# Patient Record
Sex: Female | Born: 1955 | Race: White | Hispanic: No
Health system: Southern US, Community
[De-identification: ages and names within clinical notes are randomized; demographics above are authoritative.]

## PROBLEM LIST (undated history)

## (undated) ENCOUNTER — Ambulatory Visit

## (undated) DIAGNOSIS — F32A Depression, unspecified: Secondary | ICD-10-CM

## (undated) DIAGNOSIS — G709 Myoneural disorder, unspecified: Secondary | ICD-10-CM

## (undated) DIAGNOSIS — G35 Multiple sclerosis: Secondary | ICD-10-CM

## (undated) DIAGNOSIS — F419 Anxiety disorder, unspecified: Secondary | ICD-10-CM

## (undated) DIAGNOSIS — Z8669 Personal history of other diseases of the nervous system and sense organs: Secondary | ICD-10-CM

## (undated) DIAGNOSIS — G43909 Migraine, unspecified, not intractable, without status migrainosus: Secondary | ICD-10-CM

## (undated) DIAGNOSIS — G35D Multiple sclerosis, unspecified: Secondary | ICD-10-CM

## (undated) DIAGNOSIS — R739 Hyperglycemia, unspecified: Secondary | ICD-10-CM

## (undated) DIAGNOSIS — E039 Hypothyroidism, unspecified: Secondary | ICD-10-CM

## (undated) HISTORY — DX: Anxiety disorder, unspecified: F41.9

## (undated) HISTORY — DX: Personal history of other diseases of the nervous system and sense organs: Z86.69

## (undated) HISTORY — DX: Hypothyroidism, unspecified: E03.9

## (undated) HISTORY — DX: Depression, unspecified: F32.A

## (undated) HISTORY — DX: Myoneural disorder, unspecified: G70.9

## (undated) HISTORY — DX: Hyperglycemia, unspecified: R73.9

## (undated) HISTORY — PX: TONSILLECTOMY: SUR1361

---

## 2017-11-15 DIAGNOSIS — S22000A Wedge compression fracture of unspecified thoracic vertebra, initial encounter for closed fracture: Secondary | ICD-10-CM | POA: Insufficient documentation

## 2017-11-15 DIAGNOSIS — G35D Multiple sclerosis, unspecified: Secondary | ICD-10-CM | POA: Insufficient documentation

## 2017-11-15 DIAGNOSIS — G35 Multiple sclerosis: Secondary | ICD-10-CM | POA: Insufficient documentation

## 2017-11-15 DIAGNOSIS — M545 Low back pain, unspecified: Secondary | ICD-10-CM | POA: Insufficient documentation

## 2019-03-21 DIAGNOSIS — M48061 Spinal stenosis, lumbar region without neurogenic claudication: Secondary | ICD-10-CM | POA: Insufficient documentation

## 2019-03-21 DIAGNOSIS — M5136 Other intervertebral disc degeneration, lumbar region: Secondary | ICD-10-CM | POA: Insufficient documentation

## 2019-07-24 ENCOUNTER — Encounter: Payer: Self-pay | Admitting: Neurology

## 2019-09-18 ENCOUNTER — Encounter: Payer: Self-pay | Admitting: Neurology

## 2019-09-24 ENCOUNTER — Other Ambulatory Visit: Payer: Self-pay

## 2019-09-24 ENCOUNTER — Ambulatory Visit
Admission: EM | Admit: 2019-09-24 | Discharge: 2019-09-24 | Disposition: A | Discharge status: Home / Self Care | Payer: Medicare Other

## 2019-09-24 DIAGNOSIS — L089 Local infection of the skin and subcutaneous tissue, unspecified: Secondary | ICD-10-CM

## 2019-09-24 DIAGNOSIS — Z5189 Encounter for other specified aftercare: Secondary | ICD-10-CM | POA: Diagnosis not present

## 2019-09-24 HISTORY — DX: Migraine, unspecified, not intractable, without status migrainosus: G43.909

## 2019-09-24 HISTORY — DX: Multiple sclerosis, unspecified: G35.D

## 2019-09-24 HISTORY — DX: Multiple sclerosis: G35

## 2019-09-24 MED ORDER — MUPIROCIN 2 % EX OINT: 1.0000 "application " | TOPICAL_OINTMENT | Freq: Two times a day (BID) | CUTANEOUS | 0 refills | Status: DC

## 2019-09-24 MED ORDER — DOXYCYCLINE HYCLATE 100 MG PO CAPS: 100.0000 mg | ORAL_CAPSULE | Freq: Two times a day (BID) | ORAL | 0 refills | Status: DC

## 2019-09-24 NOTE — ED Triage Notes (Signed)
Pt has a healing laceration from fall 2 weeks that has some redness and swelling

## 2019-09-24 NOTE — Discharge Instructions (Signed)
Wash with warm water and mild soap Keep covered Bactroban ointment prescribed.  Use as directed Prescribed doxycycline take as directed and to completion Follow up with PCP this week or next for recheck I have also included info for wound clinic Return or go to the ED if you have any new or worsening symptoms such as increased pain, redness, swelling, discharge, high fever, night sweats, abdominal pain, etc..Marland Kitchen

## 2019-09-24 NOTE — ED Provider Notes (Signed)
[facility]   [id number] 09/24/19 Arrival Time: 3329  CC: Wound check  SUBJECTIVE:  Pamela Roman is a 64 y.o. female who presents with a RT lower extremity wound that occurred apx 2 week ago.  Has MS and tripped while using her walker.  Scraped her RT LE.  Wound has been healing well, but last night noticed some redness and swelling.  Also states her dog has been licking it and its painful when she does.  Denies previous symptoms in the past or difficulty with wound healing.  Denies fever, chills, nausea, vomiting, purulent drainage, decrease strength or sensation.   ROS: As per HPI.  All other pertinent ROS negative.     Past Medical History:  Diagnosis Date  . Migraines   . Multiple sclerosis (HCC)    Past Surgical History:  Procedure Laterality Date  . TONSILLECTOMY     Allergies  Allergen Reactions  . Codeine Nausea And Vomiting   No current facility-administered medications on file prior to encounter.   Current Outpatient Medications on File Prior to Encounter  Medication Sig Dispense Refill  . alendronate (FOSAMAX) 70 MG tablet Take 70 mg by mouth once a week. Take with a full glass of water on an empty stomach.    . levothyroxine (SYNTHROID) 25 MCG tablet Take 25 mcg by mouth daily before breakfast.    . simvastatin (ZOCOR) 20 MG tablet Take 20 mg by mouth daily.     Social History   Socioeconomic History  . Marital status: Widowed    Spouse name: Not on file  . Number of children: Not on file  . Years of education: Not on file  . Highest education level: Not on file  Occupational History  . Not on file  Tobacco Use  . Smoking status: Passive Smoke Exposure - Never Smoker  . Smokeless tobacco: Never Used  Substance and Sexual Activity  . Alcohol use: Not Currently  . Drug use: Never  . Sexual activity: Not on file  Other Topics Concern  . Not on file  Social History Narrative  . Not on file   Social Determinants of Health   Financial Resource  Strain:   . Difficulty of Paying Living Expenses:   Food Insecurity:   . Worried About Programme researcher, broadcasting/film/video in the Last Year:   . Barista in the Last Year:   Transportation Needs:   . Freight forwarder (Medical):   Marland Kitchen Lack of Transportation (Non-Medical):   Physical Activity:   . Days of Exercise per Week:   . Minutes of Exercise per Session:   Stress:   . Feeling of Stress :   Social Connections:   . Frequency of Communication with Friends and Family:   . Frequency of Social Gatherings with Friends and Family:   . Attends Religious Services:   . Active Member of Clubs or Organizations:   . Attends Banker Meetings:   Marland Kitchen Marital Status:   Intimate Partner Violence:   . Fear of Current or Ex-Partner:   . Emotionally Abused:   Marland Kitchen Physically Abused:   . Sexually Abused:    History reviewed. No pertinent family history.   OBJECTIVE:  Vitals:   09/24/19 0826  BP: 138/89  Pulse: 76  Resp: 20  Temp: 98.2 F (36.8 C)  SpO2: 97%     General appearance: alert; no distress Head: NCAT Eye: EOM I grossly CV: Dorsalis pedis pulse 2+; cap refill < 2 sec  Lungs: normal respiratory effort Skin: 3 x 2 cm wound with overlying scab formation to anterior RLE, mild surrounding erythema, swelling appreciated, no obvious drainage or bleeding, mildly TTP Neuro: Ambulates with a walker Psychological: alert and cooperative; normal mood and affect  ASSESSMENT & PLAN:  1. Visit for wound check   2. Infected abrasion of right lower extremity, initial encounter     Meds ordered this encounter  Medications  . doxycycline (VIBRAMYCIN) 100 MG capsule    Sig: Take 1 capsule (100 mg total) by mouth 2 (two) times daily.    Dispense:  20 capsule    Refill:  0    Order Specific Question:   Supervising Provider    Answer:   Raylene Everts [[id number]]  . mupirocin ointment (BACTROBAN) 2 %    Sig: Apply 1 application topically 2 (two) times daily.    Dispense:  30 g     Refill:  0    Order Specific Question:   Supervising Provider    Answer:   Raylene Everts [[id number]]   Wash with warm water and mild soap Keep covered Bactroban ointment prescribed.  Use as directed Prescribed doxycycline take as directed and to completion Follow up with PCP this week or next for recheck I have also included info for wound clinic Return or go to the ED if you have any new or worsening symptoms such as increased pain, redness, swelling, discharge, high fever, night sweats, abdominal pain, etc...   Reviewed expectations re: course of current medical issues. Questions answered. Outlined signs and symptoms indicating need for more acute intervention. Patient verbalized understanding. After Visit Summary given.   Lestine Box, PA-C 09/24/19 [phone]

## 2019-10-09 ENCOUNTER — Other Ambulatory Visit: Payer: Self-pay

## 2019-10-09 ENCOUNTER — Encounter (HOSPITAL_BASED_OUTPATIENT_CLINIC_OR_DEPARTMENT_OTHER): Payer: Medicare Other | Attending: Internal Medicine | Admitting: Internal Medicine

## 2019-10-09 DIAGNOSIS — G35 Multiple sclerosis: Secondary | ICD-10-CM | POA: Diagnosis not present

## 2019-10-09 DIAGNOSIS — Z885 Allergy status to narcotic agent status: Secondary | ICD-10-CM | POA: Diagnosis not present

## 2019-10-09 DIAGNOSIS — Z09 Encounter for follow-up examination after completed treatment for conditions other than malignant neoplasm: Secondary | ICD-10-CM | POA: Diagnosis present

## 2019-10-11 NOTE — Progress Notes (Signed)
Pamela, Roman ([id number]) Visit Report for 10/09/2019 Chief Complaint Document Details Patient Name: Date of Service: Pamela Roman, Pamela Roman 10/09/2019 1:15 PM Medical Record Number:[id number] Patient Account Number: [id number] Date of Birth/Sex: Treating RN: 1955/11/09 (64 y.o. Wynelle Link Primary Care Provider: PATIENT, NO Other Clinician: Referring Provider: Treating Provider/Extender:Aniya Jolicoeur, Lamar Sprinkles in Treatment: 0 Information Obtained from: Patient Chief Complaint 10/09/2019; patient is here for review of a traumatic wound on her right lateral lower leg Electronic Signature(s) Signed: 10/09/2019 5:24:04 PM By: Baltazar Najjar MD Entered By: Baltazar Najjar on 10/09/2019 14:53:45 -------------------------------------------------------------------------------- HPI Details Patient Name: Date of Service: Pamela, Roman 10/09/2019 1:15 PM Medical Record Number:[id number] Patient Account Number: [id number] Date of Birth/Sex: Treating RN: 1956/02/07 (64 y.o. Wynelle Link Primary Care Provider: PATIENT, NO Other Clinician: Referring Provider: Treating Provider/Extender:Jaila Schellhorn, Lamar Sprinkles in Treatment: 0 History of Present Illness HPI Description: 10/09/2019; ADMISSION This is a 64 year old woman who recently moved to [place] from [place]. She was having work done at her home and fell while walking with her walker. This was about 6 weeks ago. She did not show up for a medical review until 09/24/2019 when she was seen at an urgent care with an infected abrasion in the right lower extremity. She was given doxycycline which the patient has completed and mupirocin which she is stopped using. She arrives in clinic for review of this. Past medical history includes multiple sclerosis, migraines, hypothyroidism and hyperlipidemia. Electronic Signature(s) Signed: 10/09/2019 5:24:04 PM By: Baltazar Najjar MD Entered By: Baltazar Najjar on 10/09/2019  14:55:11 -------------------------------------------------------------------------------- Physical Exam Details Patient Name: Date of Service: Pamela, Roman 10/09/2019 1:15 PM Medical Record Number:[id number] Patient Account Number: [id number] Date of Birth/Sex: Treating RN: 03/04/1956 (64 y.o. Wynelle Link Primary Care Provider: PATIENT, NO Other Clinician: Referring Provider: Treating Provider/Extender:Millicent Blazejewski, Lamar Sprinkles in Treatment: 0 Constitutional Sitting or standing Blood Pressure is within target range for patient.. Pulse regular and within target range for patient.Marland Kitchen Respirations regular, non-labored and within target range.. Temperature is normal and within the target range for the patient.Marland Kitchen Appears in no distress. Respiratory work of breathing is normal. Bilateral breath sounds are clear and equal in all lobes with no wheezes, rales or rhonchi.. Cardiovascular Heart rhythm and rate regular, without murmur or gallop.. Pedal pulses are palpable. No major edema. Gastrointestinal (GI) Abdomen is soft and non-distended without masses or tenderness.. No liver or spleen enlargement. Genitourinary (GU) Bladder is not distended. Integumentary (Hair, Skin) There is no evidence of infection.. Notes Wound exam; fortunately the patient's wound is fully epithelialized and healed. This was on the right lateral calf she has some swelling around the wound which I think was an underlying hematoma at 1 point although this is nontender and not infected. Electronic Signature(s) Signed: 10/09/2019 5:24:04 PM By: Baltazar Najjar MD Entered By: Baltazar Najjar on 10/09/2019 14:56:31 -------------------------------------------------------------------------------- Physician Orders Details Patient Name: Date of Service: Pamela, Roman 10/09/2019 1:15 PM Medical Record Number:[id number] Patient Account Number: [id number] Date of Birth/Sex: Treating RN: 1956/05/07 (64 y.o. Wynelle Link Primary Care Provider: PATIENT, NO Other Clinician: Referring Provider: Treating Provider/Extender:Nolan Lasser, Lamar Sprinkles in Treatment: 0 Verbal / Phone Orders: No Diagnosis Coding Discharge From [facility] Services Discharge from Wound Care Center - CONSULT ONLY - NO OPEN WOUNDS Electronic Signature(s) Signed: 10/09/2019 5:09:30 PM By: Zandra Abts RN, BSN Signed: 10/09/2019 5:24:04 PM By: Baltazar Najjar MD Entered By: Zandra Abts on 10/09/2019 14:01:39 -------------------------------------------------------------------------------- Problem List Details Patient Name: Date of Service: Pamela, Roman 10/09/2019 1:15 PM Medical Record Number:[id number] Patient Account  Number: [id number] Date of Birth/Sex: Treating RN: 12/03/1955 (64 y.o. 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 Wynelle Link Primary Care Provider: PATIENT, NO Other Clinician: Referring Provider: Treating Provider/Extender:Eion Timbrook, Lamar Sprinkles in Treatment: 0 Subjective Chief Complaint Information obtained from Patient 10/09/2019; patient is here for review of a traumatic wound on her right lateral lower leg History  of Present Illness (HPI) 10/09/2019; ADMISSION This is a 64 year old woman who recently moved to [place] from [place]. She was having work done at her home and fell while walking with her walker. This was about 6 weeks ago. She did not show up for a medical review until 09/24/2019 when she was seen at an urgent care with an infected abrasion in the right lower extremity. She was given doxycycline which the patient has completed and mupirocin which she is stopped using. She arrives in clinic for review of this. Past medical history includes multiple sclerosis, migraines, hypothyroidism and hyperlipidemia. Patient History Information obtained from Patient. Allergies codeine Family History Diabetes - Father, Heart Disease - Father, Thyroid Problems - Siblings, No family history of Cancer, Hereditary Spherocytosis, Hypertension, Kidney Disease, Lung Disease, Seizures, Stroke, Tuberculosis. Social History Never smoker, Marital Status - Widowed, Alcohol Use - Never, Drug Use - No History, Caffeine Use - Rarely. Medical History Eyes Patient has history of Cataracts Denies history of Glaucoma, Optic Neuritis Ear/Nose/Mouth/Throat Denies history of Chronic sinus problems/congestion, Middle ear problems Hematologic/Lymphatic Denies history of Anemia, Hemophilia, Human Immunodeficiency Virus, Lymphedema, Sickle Cell Disease Respiratory Denies history of Aspiration, Asthma, Chronic Obstructive Pulmonary Disease (COPD), Pneumothorax, Sleep Apnea, Tuberculosis Cardiovascular Denies history of Angina, Arrhythmia, Congestive Heart Failure, Coronary Artery Disease, Deep Vein Thrombosis, Hypertension, Hypotension, Myocardial Infarction, Peripheral Arterial Disease, Peripheral Venous Disease, Phlebitis, Vasculitis Gastrointestinal Denies history of Cirrhosis , Colitis, Crohnoos, Hepatitis A, Hepatitis B, Hepatitis C Endocrine Denies history of Type I Diabetes, Type II  Diabetes Genitourinary Denies history of End Stage Renal Disease Integumentary (Skin) Denies history of History of Burn Musculoskeletal Denies history of Gout, Rheumatoid Arthritis, Osteoarthritis, Osteomyelitis Neurologic Denies history of Dementia, Neuropathy, Quadriplegia, Paraplegia, Seizure Disorder Oncologic Denies history of Received Chemotherapy, Received Radiation Psychiatric Denies history of Anorexia/bulimia, Confinement Anxiety Review of Systems (ROS) Constitutional Symptoms (General Health) Denies complaints or symptoms of Fatigue, Fever, Chills, Marked Weight Change. Eyes Complains or has symptoms of Glasses / Contacts. Denies complaints or symptoms of Dry Eyes, Vision Changes. Ear/Nose/Mouth/Throat Denies complaints or symptoms of Chronic sinus problems or rhinitis. Respiratory Denies complaints or symptoms of Chronic or frequent coughs, Shortness of Breath. Cardiovascular Denies complaints or symptoms of Chest pain. Gastrointestinal Denies complaints or symptoms of Frequent diarrhea, Nausea, Vomiting. Endocrine Denies complaints or symptoms of Heat/cold intolerance. Genitourinary Denies complaints or symptoms of Frequent urination. Integumentary (Skin) Complains or has symptoms of Wounds. Musculoskeletal Denies complaints or symptoms of Muscle Pain, Muscle Weakness. Neurologic Denies complaints or symptoms of Numbness/parasthesias. Psychiatric Denies complaints or symptoms of Claustrophobia, Suicidal. Objective  Constitutional Sitting or standing Blood Pressure is within target range for patient.. Pulse regular and within target range for patient.Marland Kitchen Respirations regular, non-labored and within target range.. Temperature is normal and within the target range for the patient.Marland Kitchen Appears in no distress. Vitals Time Taken: 1:19 PM, Height: 63 in, Source: Stated, Weight: 175 lbs, Source: Stated, BMI: 31, Temperature: 98.1 F, Pulse: 86 bpm, Respiratory Rate: 18  breaths/min, Blood Pressure: 140/83 mmHg. Respiratory work of breathing is normal. Bilateral breath sounds are clear and equal in all lobes with no wheezes, rales or rhonchi.. Cardiovascular Heart rhythm and rate regular, without murmur or gallop.. Pedal pulses are palpable. No major edema. Gastrointestinal (GI) Abdomen is soft and non-distended without masses or tenderness.. No liver or spleen enlargement. Genitourinary (GU) Bladder is not distended. General Notes: Wound exam; fortunately the patient's wound is fully epithelialized and healed. This was on the right lateral calf she has some swelling around the wound which I think was an underlying hematoma at 1 point although this is nontender and not infected. Integumentary (Hair, Skin) There is no evidence of infection.. Wound #1 status is Healed - Epithelialized. Original cause of wound was Trauma. The wound is located on the Right,Anterior Lower Leg. The wound measures 0cm length x 0cm width x 0cm depth; 0cm^2 area and 0cm^3 volume. There is no tunneling or undermining noted. There is a none present amount of drainage noted. The wound margin is flat and intact. There is no granulation within the wound bed. There is no necrotic tissue within the wound bed. Assessment Active Problems ICD-10 Non-pressure chronic ulcer of other part of right lower leg with other specified severity Multiple sclerosis Plan Discharge From [facility] Services: Discharge from Wound Care Center - CONSULT ONLY - NO OPEN WOUNDS 1. The patient has no open wound she therefore does not need to be followed here 2. She has a small remaining probable hematoma but I do not think she needed any additional compression. 3. The patient has a follow-up with her primary doctor and a neurologist. I think this is satisfactory for follow-up. Electronic Signature(s) Signed: 10/09/2019 5:24:04 PM By: Baltazar Najjar MD Entered By: Baltazar Najjar on 10/09/2019  14:57:26 -------------------------------------------------------------------------------- HxROS Details Patient Name: Date of Service: AJANAY, FARVE 10/09/2019 1:15 PM Medical Record Number:[id number] Patient Account Number: [id number] Date of Birth/Sex: Treating RN: October 16, 1955 (64 y.o. Freddy Finner Primary Care Provider: PATIENT, NO Other Clinician: Referring Provider: Treating Provider/Extender:Gari Trovato, Lamar Sprinkles in Treatment: 0 Information Obtained From Patient Constitutional Symptoms (General Health) Complaints and Symptoms: Negative for: Fatigue; Fever; Chills; Marked Weight Change Eyes Complaints and Symptoms: Positive for: Glasses / Contacts Negative for: Dry Eyes; Vision Changes Medical History: Positive for: Cataracts Negative for: Glaucoma; Optic Neuritis Ear/Nose/Mouth/Throat Complaints and Symptoms: Negative for: Chronic sinus problems or rhinitis Medical History: Negative for: Chronic sinus problems/congestion; Middle ear problems Respiratory Complaints and Symptoms: Negative for: Chronic or frequent coughs; Shortness of Breath Medical History: Negative for: Aspiration; Asthma; Chronic Obstructive Pulmonary Disease (COPD); Pneumothorax; Sleep Apnea; Tuberculosis Cardiovascular Complaints and Symptoms: Negative for: Chest pain Medical History: Negative for: Angina; Arrhythmia; Congestive Heart Failure; Coronary Artery Disease; Deep Vein Thrombosis; Hypertension; Hypotension; Myocardial Infarction; Peripheral Arterial Disease; Peripheral Venous Disease; Phlebitis; Vasculitis Gastrointestinal Complaints and Symptoms: Negative for: Frequent diarrhea; Nausea; Vomiting Medical History: Negative for: Cirrhosis ; Colitis; Crohns; Hepatitis A; Hepatitis B; Hepatitis C Endocrine Complaints and Symptoms: Negative for: Heat/cold intolerance Medical History: Negative for: Type I Diabetes; Type II Diabetes Genitourinary Complaints and Symptoms: Negative for:  Frequent  urination Medical History: Negative for: End Stage Renal Disease Integumentary (Skin) Complaints and Symptoms: Positive for: Wounds Medical History: Negative for: History of Burn Musculoskeletal Complaints and Symptoms: Negative for: Muscle Pain; Muscle Weakness Medical History: Negative for: Gout; Rheumatoid Arthritis; Osteoarthritis; Osteomyelitis Neurologic Complaints and Symptoms: Negative for: Numbness/parasthesias Medical History: Negative for: Dementia; Neuropathy; Quadriplegia; Paraplegia; Seizure Disorder Psychiatric Complaints and Symptoms: Negative for: Claustrophobia; Suicidal Medical History: Negative for: Anorexia/bulimia; Confinement Anxiety Hematologic/Lymphatic Medical History: Negative for: Anemia; Hemophilia; Human Immunodeficiency Virus; Lymphedema; Sickle Cell Disease Oncologic Medical History: Negative for: Received Chemotherapy; Received Radiation HBO Extended History Items Eyes: Cataracts Immunizations Pneumococcal Vaccine: Received Pneumococcal Vaccination: No Implantable Devices None Family and Social History Cancer: No; Diabetes: Yes - Father; Heart Disease: Yes - Father; Hereditary Spherocytosis: No; Hypertension: No; Kidney Disease: No; Lung Disease: No; Seizures: No; Stroke: No; Thyroid Problems: Yes - Siblings; Tuberculosis: No; Never smoker; Marital Status - Widowed; Alcohol Use: Never; Drug Use: No History; Caffeine Use: Rarely; Financial Concerns: No; Food, Clothing or Shelter Needs: No; Support System Lacking: No; Transportation Concerns: No Electronic Signature(s) Signed: 10/09/2019 5:24:04 PM By: Linton Ham MD Signed: 10/10/2019 6:43:44 PM By: Carlene Coria RN Entered By: Carlene Coria on 10/09/2019 13:24:33 -------------------------------------------------------------------------------- SuperBill Details Patient Name: Date of Service: LAVRA, IMLER 10/09/2019 Medical Record [place] Patient Account Number:  [id number] Date of Birth/Sex: Treating RN: 1956-04-16 (65 y.o. Nancy Fetter Primary Care Provider: PATIENT, NO Other Clinician: Referring Provider: Treating Provider/Extender:Alcus Bradly, Esperanza Richters in Treatment: 0 Diagnosis Coding ICD-10 Codes Code Description L97.818 Non-pressure chronic ulcer of other part of right lower leg with other specified severity G35 Multiple sclerosis Facility Procedures CPT4 Code: [id number] Description: [facility] VISIT-LEV 3 EST PT Modifier: Quantity: 1 Physician Procedures CPT4 Code Description: [id number] - WC PHYS LEVEL 2 - NEW PT ICD-10 Diagnosis Description L97.818 Non-pressure chronic ulcer of other part of right lower l severity G35 Multiple sclerosis Modifier: eg with other spec Quantity: 1 ified Electronic Signature(s) Signed: 10/09/2019 5:24:04 PM By: Linton Ham MD Entered By: Linton Ham on 10/09/2019 14:58:07

## 2019-10-11 NOTE — Progress Notes (Signed)
Pamela, Roman ([id number]) Visit Report for 10/09/2019 Abuse/Suicide Risk Screen Details Patient Name: Date of Service: Pamela Roman, Pamela Roman 10/09/2019 1:15 PM Medical Record Pamela Roman Patient Account Number: [id number] Date of Birth/Sex: Treating RN: 09/18/1955 (64 y.o. Orvan Falconer Primary Care Raphaela Cannaday: PATIENT, NO Other Clinician: Referring Jadan Rouillard: Treating Athaliah Baumbach/Extender:Robson, Esperanza Richters in Treatment: 0 Abuse/Suicide Risk Screen Items Answer ABUSE RISK SCREEN: Has anyone close to you tried to hurt or harm you recentlyo No Do you feel uncomfortable with anyone in your familyo No Has anyone forced you do things that you didnt want to doo No Electronic Signature(s) Signed: 10/10/2019 6:43:44 PM By: Carlene Coria RN Entered By: Carlene Coria on 10/09/2019 13:25:01 -------------------------------------------------------------------------------- Activities of Daily Living Details Patient Name: Date of Service: Pamela, Roman 10/09/2019 1:15 PM Medical Record [place] Patient Account Number: [id number] Date of Birth/Sex: Treating RN: 09-18-1955 (64 y.o. Orvan Falconer Primary Care Meryl Hubers: PATIENT, NO Other Clinician: Referring Jozlin Bently: Treating Joliana Claflin/Extender:Robson, Esperanza Richters in Treatment: 0 Activities of Daily Living Items Answer Activities of Daily Living (Please select one for each item) Drive Automobile Completely Able Take Medications Completely Able Use Telephone Completely Able Care for Appearance Completely Able Use Toilet Completely Able Bath / Shower Completely Able Dress Self Completely Able Feed Self Completely Able Walk Completely Able Get In / Out Bed Completely Able Housework Need Assistance Prepare Meals Completely [place] Completely Able Shop for Self Completely Able Electronic Signature(s) Signed: 10/10/2019 6:43:44 PM By: Carlene Coria RN Entered By: Carlene Coria on 10/09/2019  13:26:00 -------------------------------------------------------------------------------- Education Screening Details Patient Name: Date of Service: [name], [name] 10/09/2019 1:15 PM Medical Record Pamela Roman Patient Account Number: [id number] Date of Birth/Sex: Treating RN: [birth year] (64 y.o. Orvan Falconer Primary Care Zamyah Wiesman: PATIENT, NO Other Clinician: Referring Julian Medina: Treating Rhilyn Battle/Extender:Robson, Esperanza Richters in Treatment: 0 Primary Learner Assessed: Patient Learning Preferences/Education Level/Primary Language Learning Preference: Explanation Highest Education Level: College or Above Preferred Language: English Cognitive Barrier Language Barrier: No Translator Needed: No Memory Deficit: No Emotional Barrier: No Cultural/Religious Beliefs Affecting Medical Care: No Physical Barrier Impaired Vision: Yes Glasses Impaired Hearing: No Decreased Hand dexterity: No Knowledge/Comprehension Knowledge Level: Medium Comprehension Level: High Ability to understand written High instructions: Ability to understand verbal High instructions: Motivation Anxiety Level: Calm Cooperation: Cooperative Education Importance: Acknowledges Need Interest in Health Problems: Asks Questions Perception: Coherent Willingness to Engage in Self- High Management Activities: Readiness to Engage in Self- High Management Activities: Electronic Signature(s) Signed: 10/10/2019 6:43:44 PM By: Carlene Coria RN Entered By: Carlene Coria on 10/09/2019 13:27:39 -------------------------------------------------------------------------------- Fall Risk Assessment Details Patient Name: Date of Service: Pamela, Roman 10/09/2019 1:15 PM Medical Record Pamela Roman Patient Account Number: [id number] Date of Birth/Sex: Treating RN: 10/17/1955 (64 y.o. Orvan Falconer Primary Care Kanai Berrios: PATIENT, NO Other Clinician: Referring Charles Niese: Treating Adarrius Graeff/Extender:Robson, Esperanza Richters  in Treatment: 0 Fall Risk Assessment Items Have you had 2 or more falls in the last 12 monthso 0 Yes Have you had any fall that resulted in injury in the last 12 monthso 0 Yes FALLS RISK SCREEN History of falling - immediate or within 3 months 25 Yes Secondary diagnosis (Do you have 2 or more medical diagnoseso) 0 No Ambulatory aid None/bed rest/wheelchair/nurse 0 No Crutches/cane/walker 0 No Furniture 0 No Intravenous therapy Access/Saline/Heparin Lock 0 No Weak (short steps with or without shuffle, stooped but able to lift head 0 No while walking, may seek support from furniture) Impaired (short steps with shuffle, may have difficulty arising from chair, 0 No head down, impaired balance) Mental Status  Oriented to own ability 0 No Overestimates or forgets limitations 0 No Risk Level: Medium Risk Score: 25 Electronic Signature(s) Signed: 10/10/2019 6:43:44 PM By: Yevonne Pax RN Entered By: Yevonne Pax on 10/09/2019 13:28:07 -------------------------------------------------------------------------------- Foot Assessment Details Patient Name: Date of Service: Pamela, Roman 10/09/2019 1:15 PM Medical Record Number:[id number] Patient Account Number: [id number] Date of Birth/Sex: Treating RN: Dec 07, 1955 (64 y.o. Pamela Roman, Carrie Primary Care Pamela Roman: PATIENT, NO Other Clinician: Referring Anielle Headrick: Treating Kimberle Stanfill/Extender:Robson, Lamar Sprinkles in Treatment: 0 Foot Assessment Items Site Locations + = Sensation present, - = Sensation absent, C = Callus, U = Ulcer R = Redness, W = Warmth, M = Maceration, PU = Pre-ulcerative lesion F = Fissure, S = Swelling, D = Dryness Assessment Right: Left: Other Deformity: No No Prior Foot Ulcer: No No Prior Amputation: No No Charcot Joint: No No Ambulatory Status: Ambulatory Without Help Gait: Steady Electronic Signature(s) Signed: 10/10/2019 6:43:44 PM By: Yevonne Pax RN Entered By: Yevonne Pax on 10/09/2019  13:30:26 -------------------------------------------------------------------------------- Nutrition Risk Screening Details Patient Name: Date of Service: [name], [name] 10/09/2019 1:15 PM Medical Record Number:[id number] Patient Account Number: [id number] Date of Birth/Sex: Treating RN: [birth year] (63 y.o. Freddy Finner Primary Care Jariyah Hackley: PATIENT, NO Other Clinician: Referring Nare Gaspari: Treating Cala Kruckenberg/Extender:Robson, Lamar Sprinkles in Treatment: 0 Height (in): 63 Weight (lbs): 175 Body Mass Index (BMI): 31 Nutrition Risk Screening Items Score Screening NUTRITION RISK SCREEN: I have an illness or condition that made me change the kind and/or 0 No amount of food I eat I eat fewer than two meals per day 0 No I eat few fruits and vegetables, or milk products 0 No I have three or more drinks of beer, liquor or wine almost every day 0 No I have tooth or mouth problems that make it hard for me to eat 0 No I don't always have enough money to buy the food I need 0 No I eat alone most of the time 0 No I take three or more different prescribed or over-the-counter drugs a day 1 Yes 0 No Without wanting to, I have lost or gained 10 pounds in the last six months I am not always physically able to shop, cook and/or feed myself 2 Yes Nutrition Protocols Good Risk Protocol Provide education on Moderate Risk Protocol 0 nutrition High Risk Proctocol Risk Level: Moderate Risk Score: 3 Electronic Signature(s) Signed: 10/10/2019 6:43:44 PM By: Yevonne Pax RN Entered By: Yevonne Pax on 10/09/2019 13:28:34

## 2019-10-11 NOTE — Progress Notes (Signed)
Pamela Roman, Pamela Roman ([id number]) Visit Report for 10/09/2019 Allergy List Details Patient Name: Date of Service: Pamela Roman, Pamela Roman 10/09/2019 1:15 PM Medical Record Number:[id number] Patient Account Number: [id number] Date of Birth/Sex: Treating RN: Sep 22, 1955 (64 y.o. Freddy Finner Primary Care Devika Dragovich: PATIENT, NO Other Clinician: Referring Cecilio Ohlrich: Treating Ronya Gilcrest/Extender:Robson, Lamar Sprinkles in Treatment: 0 Allergies Active Allergies codeine Allergy Notes Electronic Signature(s) Signed: 10/10/2019 6:43:44 PM By: Yevonne Pax RN Entered By: Yevonne Pax on 10/09/2019 13:20:46 -------------------------------------------------------------------------------- Arrival Information Details Patient Name: Date of Service: Pamela Roman, Pamela Roman 10/09/2019 1:15 PM Medical Record Number:[id number] Patient Account Number: [id number] Date of Birth/Sex: Treating RN: 04/20/1956 (64 y.o. Freddy Finner Primary Care Haskell Rihn: PATIENT, NO Other Clinician: Referring Maalik Pinn: Treating Rockland Kotarski/Extender:Robson, Lamar Sprinkles in Treatment: 0 Visit Information Patient Arrived: Ambulatory Arrival Time: 13:16 Accompanied By: self Transfer Assistance: None Patient Identification Verified: Yes Secondary Verification Process Completed: Yes Patient Requires Transmission-Based No Precautions: Patient Has Alerts: No Electronic Signature(s) Signed: 10/10/2019 6:43:44 PM By: Yevonne Pax RN Entered By: Yevonne Pax on 10/09/2019 13:17:37 -------------------------------------------------------------------------------- Clinic Level of Care Assessment Details Patient Name: Date of Service: Pamela Roman 10/09/2019 1:15 PM Medical Record Number:[id number] Patient Account Number: [id number] Date of Birth/Sex: Treating RN: 1955/07/23 (64 y.o. Wynelle Link Primary Care Aiyden Lauderback: PATIENT, NO Other Clinician: Referring Wong Steadham: Treating Norvell Ureste/Extender:Robson, Lamar Sprinkles in Treatment: 0 Clinic Level of Care Assessment  Items TOOL 2 Quantity Score X - Use when only an EandM is performed on the INITIAL visit 1 0 ASSESSMENTS - Nursing Assessment / Reassessment X - General Physical Exam (combine w/ comprehensive assessment (listed just below) 1 20 when performed on new pt. evals) X - Comprehensive Assessment (HX, ROS, Risk Assessments, Wounds Hx, etc.) 1 25 ASSESSMENTS - Wound and Skin Assessment / Reassessment []  - Simple Wound Assessment / Reassessment - one wound 0 []  - Complex Wound Assessment / Reassessment - multiple wounds 0 []  - Dermatologic / Skin Assessment (not related to wound area) 0 ASSESSMENTS - Ostomy and/or Continence Assessment and Care []  - Incontinence Assessment and Management 0 []  - Ostomy Care Assessment and Management (repouching, etc.) 0 PROCESS - Coordination of Care X - Simple Patient / Family Education for ongoing care 1 15 []  - Complex (extensive) Patient / Family Education for ongoing care 0 X - Staff obtains , Records, Test Results / Process Orders 1 10 []  - Staff telephones HHA, Nursing Homes / Clarify orders / etc 0 []  - Routine Transfer to another Facility (non-emergent condition) 0 []  - Routine Hospital Admission (non-emergent condition) 0 X - New Admissions / / Ordering NPWT, Apligraf, etc. 1 15 []  - Emergency Hospital Admission (emergent condition) 0 X - Simple Discharge Coordination 1 10 []  - Complex (extensive) Discharge Coordination 0 PROCESS - Special Needs []  - Pediatric / Minor Patient Management 0 []  - Isolation Patient Management 0 []  - Hearing / Language / Visual special needs 0 []  - Assessment of Community assistance (transportation, D/C planning, etc.) 0 []  - Additional assistance / Altered mentation 0 []  - Support Surface(s) Assessment (bed, cushion, seat, etc.) 0 INTERVENTIONS - Wound Cleansing / Measurement []  - Wound Imaging (photographs - any number of wounds) 0 []  - Wound Tracing (instead of photographs) 0 []  -  Simple Wound Measurement - one wound 0 []  - Complex Wound Measurement - multiple wounds 0 []  - Simple Wound Cleansing - one wound 0 []  - Complex Wound Cleansing - multiple wounds 0 INTERVENTIONS - Wound Dressings []  - Small Wound Dressing one or multiple wounds 0 []  -  Medium Wound Dressing one or multiple wounds 0 []  - Large Wound Dressing one or multiple wounds 0 []  - Application of Medications - injection 0 INTERVENTIONS - Miscellaneous []  - External ear exam 0 []  - Specimen Collection (cultures, biopsies, blood, body fluids, etc.) 0 []  - Specimen(s) / Culture(s) sent or taken to Lab for analysis 0 []  - Patient Transfer (multiple staff / Harrel Lemon Lift / Similar devices) 0 []  - Simple Staple / Suture removal (25 or less) 0 []  - Complex Staple / Suture removal (26 or more) 0 []  - Hypo / Hyperglycemic Management (close monitor of Blood Glucose) 0 []  - Ankle / Brachial Index (ABI) - do not check if billed separately 0 Has the patient been seen at the hospital within the last three years: Yes Total Score: 95 Level Of Care: New/Established - Level 3 Electronic Signature(s) Signed: 10/09/2019 5:09:30 PM By: Levan Hurst RN, BSN Entered By: Levan Hurst on 10/09/2019 14:02:51 -------------------------------------------------------------------------------- Multi Wound Chart Details Patient Name: Date of Service: Pamela Roman, Pamela Roman 10/09/2019 1:15 PM Medical Record [place] Patient Account Number: [id number] Date of Birth/Sex: Treating RN: 08-12-1955 (64 y.o. Nancy Fetter Primary Care Lisseth Brazeau: PATIENT, NO Other Clinician: Referring Jazline Cumbee: Treating Shavona Gunderman/Extender:Robson, Esperanza Richters in Treatment: 0 Vital Signs Height(in): 63 Pulse(bpm): 86 Weight(lbs): 175 Blood Pressure(mmHg): 140/83 Body Mass Index(BMI): 31 Temperature(F): 98.1 Respiratory 18 Rate(breaths/min): Photos: [1:No Photos] [N/A:N/A] Wound Location: [1:Right, Anterior Lower Leg] [N/A:N/A] Wounding  Event: [1:Trauma] [N/A:N/A] Primary Etiology: [1:Abrasion] [N/A:N/A] Comorbid History: [1:Cataracts] [N/A:N/A] Date Acquired: [1:09/04/2019] [N/A:N/A] Weeks of Treatment: [1:0] [N/A:N/A] Wound Status: [1:Healed - Epithelialized] [N/A:N/A] Measurements L x W x D [1:0x0x0] [N/A:N/A] (cm) Area (cm) : [1:0] [N/A:N/A] Volume (cm) : [1:0] [N/A:N/A] Classification: [1:Full Thickness Without Exposed Support Structures] [N/A:N/A] Exudate Amount: [1:None Present] [N/A:N/A] Wound Margin: [1:Flat and Intact] [N/A:N/A] Granulation Amount: [1:None Present (0%)] [N/A:N/A] Necrotic Amount: [1:None Present (0%)] [N/A:N/A] Exposed Structures: [1:Fascia: No Fat Layer (Subcutaneous Tissue) Exposed: No Tendon: No Muscle: No Joint: No Bone: No] [N/A:N/A] Epithelialization: [1:Large (67-100%)] [N/A:N/A] Electronic Signature(s) Signed: 10/09/2019 5:09:30 PM By: Levan Hurst RN, BSN Signed: 10/09/2019 5:24:04 PM By: Linton Ham MD Entered By: Linton Ham on 10/09/2019 14:53:17 -------------------------------------------------------------------------------- Pain Assessment Details Patient Name: Date of Service: Pamela Roman, Pamela Roman 10/09/2019 1:15 PM Medical Record Number:[id number] Patient Account Number: [id number] Date of Birth/Sex: Treating RN: [birth year] (64 y.o. Orvan Falconer Primary Care Dona Walby: PATIENT, NO Other Clinician: Referring Lory Galan: Treating Tian Mcmurtrey/Extender:Robson, Esperanza Richters in Treatment: 0 Active Problems Location of Pain Severity and Description of Pain Patient Has Paino No Site Locations Pain Management and Medication Current Pain Management: Electronic Signature(s) Signed: 10/10/2019 6:43:44 PM By: Carlene Coria RN Entered By: Carlene Coria on 10/09/2019 13:35:25 -------------------------------------------------------------------------------- Patient/Caregiver Education Details Patient Name: Date of Service: Pamela Roman 4/5/2021andnbsp1:15 PM Medical Record  [place] Patient Account Number: [id number] Date of Birth/Gender: [birth year] (63 y.o. F) Treating RN: Levan Hurst Primary Care Physician: PATIENT, NO Other Clinician: Referring Physician: Treating Physician/Extender:Robson, Esperanza Richters in Treatment: 0 Education Assessment Education Provided To: Patient Education Topics Provided Wound/Skin Impairment: Methods: Explain/Verbal Responses: State content correctly Electronic Signature(s) Signed: 10/09/2019 5:09:30 PM By: Levan Hurst RN, BSN Entered By: Levan Hurst on 10/09/2019 14:01:57 -------------------------------------------------------------------------------- Wound Assessment Details Patient Name: Date of Service: Pamela Roman, Pamela Roman 10/09/2019 1:15 PM Medical Record [place] Patient Account Number: [id number] Date of Birth/Sex: Treating RN: 1955/07/18 (64 y.o. Nancy Fetter Primary Care Griselda Tosh: PATIENT, NO Other Clinician: Referring Nezar Buckles: Treating Mafalda Mcginniss/Extender:Robson, Esperanza Richters in Treatment: 0 Wound Status Wound Number: 1 Primary Etiology: Abrasion Wound Location: Right,  Anterior Lower Leg Wound Status: Healed - Epithelialized Wounding Event: Trauma Comorbid History: Cataracts Date Acquired: 09/04/2019 Weeks Of Treatment: 0 Clustered Wound: No Wound Measurements Length: (cm) 0 % Reduct Width: (cm) 0 % Reduct Depth: (cm) 0 Epitheli Area: (cm) 0 Tunneli Volume: (cm) 0 Undermi Wound Description Full Thickness Without Exposed Support Foul Odo Classification: Structures Slough/F Wound Flat and Intact Margin: Exudate None Present Amount: Wound Bed Granulation Amount: None Present (0%) Necrotic Amount: None Present (0%) Fascia E Fat Laye Tendon E Muscle E Joint Ex Bone Exp r After Cleansing: No ibrino No Exposed Structure xposed: No r (Subcutaneous Tissue) Exposed: No xposed: No xposed: No posed: No osed: No ion in Area: ion in Volume: alization: Large (67-100%) ng:  No ning: No Electronic Signature(s) Signed: 10/09/2019 5:09:30 PM By: Zandra Abts RN, BSN Entered By: Zandra Abts on 10/09/2019 14:01:01 -------------------------------------------------------------------------------- Vitals Details Patient Name: Date of Service: Pamela Roman, Pamela Roman 10/09/2019 1:15 PM Medical Record Number:[id number] Patient Account Number: [id number] Date of Birth/Sex: Treating RN: Nov 12, 1955 (64 y.o. Freddy Finner Primary Care Cataleah Stites: PATIENT, NO Other Clinician: Referring Nachum Derossett: Treating Nils Thor/Extender:Robson, Lamar Sprinkles in Treatment: 0 Vital Signs Time Taken: 13:19 Temperature (F): 98.1 Height (in): 63 Pulse (bpm): 86 Source: Stated Respiratory Rate (breaths/min): 18 Weight (lbs): 175 Blood Pressure (mmHg): 140/83 Source: Stated Reference Range: 80 - 120 mg / dl Body Mass Index (BMI): 31 Electronic Signature(s) Signed: 10/10/2019 6:43:44 PM By: Yevonne Pax RN Entered By: Yevonne Pax on 10/09/2019 13:20:10

## 2019-11-06 ENCOUNTER — Other Ambulatory Visit (HOSPITAL_COMMUNITY): Payer: Self-pay | Admitting: Family Medicine

## 2019-11-06 ENCOUNTER — Ambulatory Visit (HOSPITAL_COMMUNITY)
Admission: RE | Admit: 2019-11-06 | Discharge: 2019-11-06 | Disposition: A | Discharge status: Home / Self Care | Payer: Medicare Other | Source: Ambulatory Visit | Attending: Family Medicine | Admitting: Family Medicine

## 2019-11-06 ENCOUNTER — Other Ambulatory Visit: Payer: Self-pay

## 2019-11-06 DIAGNOSIS — R296 Repeated falls: Secondary | ICD-10-CM

## 2019-11-10 NOTE — Progress Notes (Signed)
NEUROLOGY CONSULTATION NOTE  Sakari Raisanen MRN: [id number] DOB: 04-Jul-1956  Referring provider: Luther Bradley, MD Primary care provider: Newt Minion, FNP  Reason for consult:  Establish care for multiple sclerosis  HISTORY OF PRESENT ILLNESS: Pamela Roman is a 64 year old left-handed white female who presents to establish care for multiple sclerosis.  History supplemented by her prior neurologist's notes.  She was diagnosed with multiple sclerosis in the early 1990s after experiencing right optic neuritis.  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  She underwent LP and MRIs, which were consistent with MS.  She initially started Betaseron and then Avonex until 2011, after which she was switched to Tysabri.  Within the last 5 years, she has had progression of disease, but particularly worse since moving here in March.  She has fallen 4 times.  She started using a walker about 5 years ago.    Vision:  In February, she developed blurred vision in her left eye.  She did not have papilledema but was found to have bilateral cataract.  Surgery scheduled in June and July.   Motor:  Bilateral leg weakness.  Arms are okay. Sensory:  She reports numbness in feet and somewhat in legs. Pain:  Low back pain.  Left hip pain.  Bilateral sharp stabbing pain in bilateral thighs.  She has degenerative disc disease of lumbar spine.   Headaches: Severe pressure-like bi-temporal/retro-orbital pain with nausea.  Responds to sumatriptan.  None recently.   Gait:  Progressive gait decline.  Uses lift power scooter.  She needs assistance now to ambulate.   Bowel/Bladder:  No Fatigue:  yes Cognition:  Memory and cognitive deficits.  MOCA from 01/03/2019 reportedly 29/30. Mood:  Some depression.  She feels it is getting better.   She is on disability.  Current DMT:  None Past DMT:  Betaseron; Avonex; Tysabri from around 2011 until September 2020 due to positive JC virus antibody with increased index  from 0.49 to 0.99.  Since then, she received Solu-Medrol every month.  Other current medications:  Sertraline 25mg  daily; sumatriptan 100mg  PRN; D3 5000 IU weekly; levothyroxine; Aleve BID for pain.  Imaging: 04/25/2019 MRI BRAIN W WO:  CEREBRUM:  Extensive multiple sclerosis plaques are visualized.  These is seen adjacent to the tmporal horns bilaterally extending to surround the atria and occipital horns bilaterally and extensively throughout the periventricular white matter bilateral.  These also extend into the bifrontal corona radiata and centrum semiovale.  No involvement of brainstem or cerebellum. 07/24/2019 MRI BRAIN W WO:  1.  Bilateral periventricular and radial white matter hyperintensities involving the frontal parietal and temporal regions bilaterally stable compared to 04/25/19.  2.  Compared to prior study the white matter hyperintensities, T1 low signal foci associated with these, and the absence of contrast enhancement appears stable.  3.  No evidence of new plaques or contrast enhancement to suggest new inflammatory foci.   PAST MEDICAL HISTORY: Multiple sclerosis Hypothyroidism  PAST SURGICAL HISTORY: None  MEDICATIONS: Current Outpatient Medications on File Prior to Visit  Medication Sig Dispense Refill  . cholecalciferol (VITAMIN D3) 25 MCG (1000 UNIT) tablet Take 5,000 Units by mouth once a week.    . levothyroxine (SYNTHROID) 25 MCG tablet Take 25 mcg by mouth daily before breakfast.    . sertraline (ZOLOFT) 25 MG tablet Take 25 mg by mouth daily.    . simvastatin (ZOCOR) 20 MG tablet Take 20 mg by mouth daily.    . SUMAtriptan (  IMITREX) 100 MG tablet Take 100 mg by mouth every 2 (two) hours as needed for migraine. May repeat in 2 hours if headache persists or recurs.     No current facility-administered medications on file prior to visit.    ALLERGIES: Codeine  FAMILY HISTORY: No family history on file.  SOCIAL HISTORY: Social History   Socioeconomic  History  . Marital status: Widowed    Spouse name: Not on file  . Number of children: Not on file  . Years of education: Not on file  . Highest education level: Not on file  Occupational History  . Not on file  Tobacco Use  . Smoking status: Not on file  Substance and Sexual Activity  . Alcohol use: Not on file  . Drug use: Not on file  . Sexual activity: Not on file  Other Topics Concern  . Not on file  Social History Narrative  . Not on file   Social Determinants of Health   Financial Resource Strain:   . Difficulty of Paying Living Expenses:   Food Insecurity:   . Worried About Charity fundraiser in the Last Year:   . Arboriculturist in the Last Year:   Transportation Needs:   . Film/video editor (Medical):   Marland Kitchen Lack of Transportation (Non-Medical):   Physical Activity:   . Days of Exercise per Week:   . Minutes of Exercise per Session:   Stress:   . Feeling of Stress :   Social Connections:   . Frequency of Communication with Friends and Family:   . Frequency of Social Gatherings with Friends and Family:   . Attends Religious Services:   . Active Member of Clubs or Organizations:   . Attends Archivist Meetings:   Marland Kitchen Marital Status:   Intimate Partner Violence:   . Fear of Current or Ex-Partner:   . Emotionally Abused:   Marland Kitchen Physically Abused:   . Sexually Abused:     PHYSICAL EXAM: Blood pressure (!) 147/87, pulse 79, height 5\' 3"  (1.6 m), weight 153 lb 9.6 oz (69.7 kg), SpO2 97 %. General: No acute distress.  Patient appears well-groomed.   Head:  Normocephalic/atraumatic Eyes:  fundi examined but not visualized Neck: supple, no paraspinal tenderness, full range of motion Back: No paraspinal tenderness Heart: regular rate and rhythm Lungs: Clear to auscultation bilaterally. Vascular: No carotid bruits. Neurological Exam: Mental status: alert and oriented to person, place, and time, recent and remote memory intact, fund of knowledge intact,  attention and concentration intact, speech fluent and not dysarthric, language intact. Cranial nerves: CN I: not tested CN II: pupils equal, round and reactive to light, right afferent pupillary defect, visual fields intact CN III, IV, VI:  Bilateral intranuclear ophthalmoplegia, no ptosis CN V: facial sensation intact CN VII: upper and lower face symmetric CN VIII: hearing intact CN IX, X: gag intact, uvula midline CN XI: sternocleidomastoid and trapezius muscles intact CN XII: tongue midline Bulk & Tone: normal, no fasciculations. Motor:  3+/5 bilateral hip flexion, otherwise, 5/5 throughout Sensation:  Pinprickand vibration sensation reduced in lower extremities Deep Tendon Reflexes:  2+ throughout, toes downgoing.   Finger to nose testing:  Without dysmetria. Gait:  Unsteady.  Unable to stand too long without assistance.  Romberg with sway.  IMPRESSION: Multiple sclerosis Migraine without aura, without status migrainosus, not intractable  PLAN: 1.  Plan to restart DMT.  Will look into Mayzent. 2.  MRI of cervical and thoracic spine  with and without contrast.  Will add another MRI of brain since I appreciate INO on exam and prior MRI revealed no brainstem lesions. 3.  Continue D3 50,000 IU daily 4.  Sumatriptan 100mg  PRN for migraine rescue therapy 5.  Will order home physical therapy.  Thank you for allowing me to take part in the care of this patient.  , DO  CC: Shon Millet, FNP

## 2019-11-13 ENCOUNTER — Encounter: Payer: Self-pay | Admitting: Neurology

## 2019-11-13 ENCOUNTER — Ambulatory Visit (INDEPENDENT_AMBULATORY_CARE_PROVIDER_SITE_OTHER): Payer: Medicare Other | Admitting: Neurology

## 2019-11-13 ENCOUNTER — Other Ambulatory Visit: Payer: Self-pay

## 2019-11-13 DIAGNOSIS — G35 Multiple sclerosis: Secondary | ICD-10-CM

## 2019-11-13 NOTE — Patient Instructions (Signed)
We will check to see which medication we will be able to start and reach out to you.  Plan is for [facility]. Check MRI of cervical and thoracic spine with and without contrast Continue D3 50,000 IU daily Sumatriptan as needed for headache Will order home physical therapy Plan to follow up in 6 months.

## 2019-11-14 ENCOUNTER — Encounter: Payer: Self-pay | Admitting: Neurology

## 2019-11-16 ENCOUNTER — Telehealth: Payer: Self-pay

## 2019-11-16 ENCOUNTER — Other Ambulatory Visit: Payer: Self-pay

## 2019-11-16 ENCOUNTER — Telehealth: Payer: Self-pay | Admitting: Neurology

## 2019-11-16 DIAGNOSIS — G35 Multiple sclerosis: Secondary | ICD-10-CM

## 2019-11-16 NOTE — Telephone Encounter (Signed)
Referral - Home Health sent to [facility]. Per Eber Jones they do not have anyone to service [facility]. SO she had to find some who does.   Referral sent to [facility].

## 2019-11-16 NOTE — Telephone Encounter (Signed)
Patient called and requested an order for her lab work be sent to [facility] in [place] on [address]. She has an appointment there on 11/28/19 for other labs from her PCP.

## 2019-11-16 NOTE — Telephone Encounter (Signed)
Pending labs awaiting Dr. Everlena Cooper

## 2019-11-20 NOTE — Telephone Encounter (Signed)
Spoke with the [employer] will send a nurse to the pt home do lab, eye exam, and EKg once forms are filled out. They will keep [place] posted as the process.   Waiting on D. Jaffe to fill out form to fax over.

## 2019-11-20 NOTE — Telephone Encounter (Signed)
Will need to check following labs: CBC with diff CYP2C9 genotype testing VZV antibody serology Hepatic function panel  She will also need formal eye exam by ophthalmology She will also need EKG

## 2019-11-20 NOTE — Telephone Encounter (Signed)
Telephone call to pt, Pending labs waiting on Dr. Everlena Cooper.

## 2019-11-20 NOTE — Telephone Encounter (Signed)
Patient called to check on the status of the request. °

## 2019-11-29 NOTE — Progress Notes (Signed)
Pamela Roman (Key: [id number]) - [id number] Mayzent 2MG  tablets Status: PA Response - Approved  Created: May 24th, 2021  Sent: May 26th, 2021  Open  Archive

## 2019-11-30 NOTE — Progress Notes (Signed)
Gara Kroner (Key: [id number]) Mayzent 2MG  tablets   Form [employer] Electronic PA Form [phone] NCPDP) Created 3 days ago Sent to Plan 20 hours ago Plan Response 20 hours ago Submit Clinical Questions 19 hours ago Determination Favorable 19 hours ago Message from Plan CaseId:[id number];Status:Approved;Review Type:Prior Auth;Coverage Start Date:10/30/2019;Coverage End Date:11/28/2020;

## 2019-12-01 ENCOUNTER — Other Ambulatory Visit: Payer: Self-pay | Admitting: Neurology

## 2019-12-01 ENCOUNTER — Telehealth: Payer: Self-pay | Admitting: Neurology

## 2019-12-01 MED ORDER — PREDNISONE 50 MG PO TABS: ORAL_TABLET | ORAL | 0 refills | Status: DC

## 2019-12-01 NOTE — Telephone Encounter (Signed)
No answer at 352

## 2019-12-01 NOTE — Telephone Encounter (Signed)
If this is an acute change, I can prescribe her high-dose prednisone, which can be used for acute flare-ups:  Prednisone 50mg  tablet:  10 tablets daily for days 1 to 3, then 8 tablets on day 4, then 6 tablets on day 5, then 4 tablets on day 6, then 2 tablets on day 7, then 1 tablet on day 8, then 1/2 tablet on day 9, then STOP.   Quantity: 57, refills 0.  Ideally, I would give Solu-Medrol but it is difficult to set up as an outpatient and it is not available over the weekend.    Now if this is a gradual progression, then it is likely not a flare-up and instead progression of her disease.  Steroids are not really indicated for that.

## 2019-12-01 NOTE — Telephone Encounter (Signed)
Pt states her legs feel numb all the way to her toes. Had to have someone to help her into her PCP office visit 11/30/19.  It's become a chore to walk form room to room now. There is no pain in legs now just  the numbness. Please advise.

## 2019-12-01 NOTE — Telephone Encounter (Signed)
Please send in RX thanks

## 2019-12-01 NOTE — Telephone Encounter (Signed)
Rx sent to [facility]

## 2019-12-01 NOTE — Telephone Encounter (Signed)
Patient called in wanting to let Dr. Everlena Cooper know she feels like she is having a MS flare and is having trouble walking.

## 2019-12-07 ENCOUNTER — Telehealth: Payer: Self-pay | Admitting: Neurology

## 2019-12-07 NOTE — Telephone Encounter (Signed)
Advised pt to discuss with Eye provider about Mayzent.  Pt states the Nurse through [facility] did come and do labs and eye exams.

## 2019-12-07 NOTE — Telephone Encounter (Signed)
Patient states that she has some questions about if EYE surgery will affect her going on a medication  Please call

## 2019-12-07 NOTE — Telephone Encounter (Signed)
Eye surgery itself shouldn't be a problem.  A side effect of Mayzent is macular edema, which she needs to be checked for prior to starting Mayzent.  Was this done?  Her eye doctor should be aware that she is starting Mayzent.

## 2019-12-07 NOTE — Telephone Encounter (Signed)
Has this been completed?  Sending to clinical staff for review: Okay to sign/close encounter or is further follow up needed? 

## 2019-12-13 ENCOUNTER — Other Ambulatory Visit (HOSPITAL_COMMUNITY): Payer: Self-pay | Admitting: Family Medicine

## 2019-12-13 DIAGNOSIS — Z1231 Encounter for screening mammogram for malignant neoplasm of breast: Secondary | ICD-10-CM

## 2019-12-14 ENCOUNTER — Telehealth: Payer: Self-pay | Admitting: Neurology

## 2019-12-14 ENCOUNTER — Other Ambulatory Visit: Payer: Self-pay | Admitting: Neurology

## 2019-12-14 MED ORDER — DIAZEPAM 5 MG PO TABS
ORAL_TABLET | ORAL | 0 refills | Status: DC
Start: 2019-12-14 — End: 2019-12-19

## 2019-12-14 NOTE — Telephone Encounter (Signed)
Sent a prescription for one diazepam.  She needs a driver to and from the MRI

## 2019-12-14 NOTE — Telephone Encounter (Signed)
Pt advised of script sent to [name]

## 2019-12-14 NOTE — Telephone Encounter (Signed)
Patient states she has a MRI scheduled and would like to know if Dr. Everlena Cooper will send in a sedative for her. She is claustrophobic [facility] [address] in [place].

## 2019-12-23 ENCOUNTER — Ambulatory Visit
Admission: RE | Admit: 2019-12-23 | Discharge: 2019-12-23 | Disposition: A | Discharge status: Home / Self Care | Payer: Medicare Other | Source: Ambulatory Visit | Attending: Neurology | Admitting: Neurology

## 2019-12-23 ENCOUNTER — Other Ambulatory Visit: Payer: Self-pay

## 2019-12-23 DIAGNOSIS — G35 Multiple sclerosis: Secondary | ICD-10-CM

## 2019-12-23 IMAGING — MR MR THORACIC SPINE WO/W CM
4 of 8 series · 14 of 48 positions shown · IV contrast (Multihance)
Comparison: None available.

CLINICAL DATA: Initial evaluation for multiple sclerosis, baseline
examination. No new symptoms.

EXAM:
MRI CERVICAL AND THORACIC SPINE WITHOUT AND WITH CONTRAST
TECHNIQUE: Multiplanar and multiecho pulse sequences of the cervical spine, to
include the craniocervical junction and cervicothoracic junction,
and the thoracic spine, were obtained without and with intravenous
contrast.
CONTRAST:  15mL MULTIHANCE GADOBENATE DIMEGLUMINE 529 MG/ML IV SOLN

[Series 12: T1 · sagittal · 3.0mm · 0.52mm/px · 3 of 17 slices shown (1 of 2)]
[im 1/17]
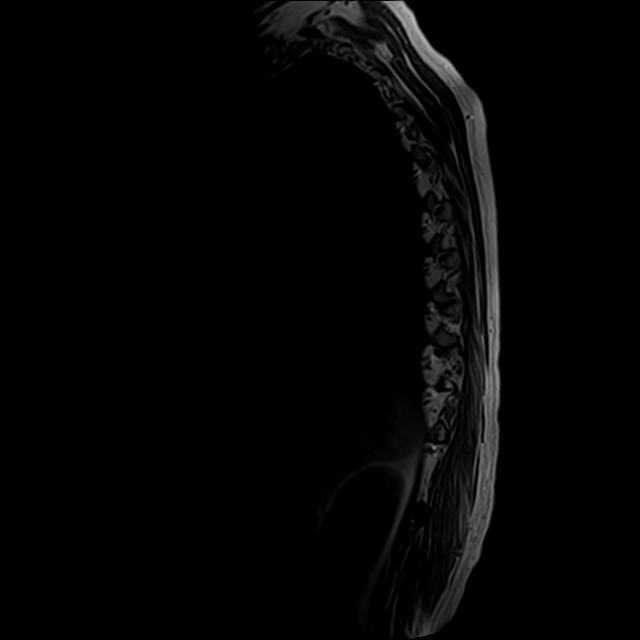
[im 11/17]
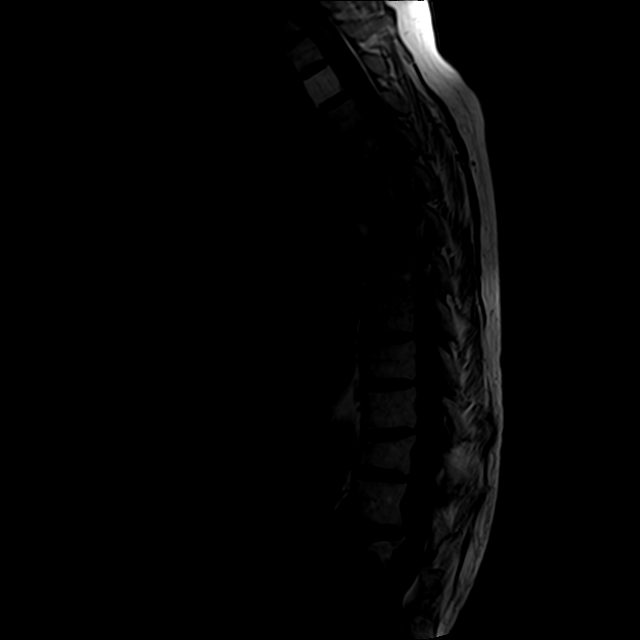
[im 17/17]
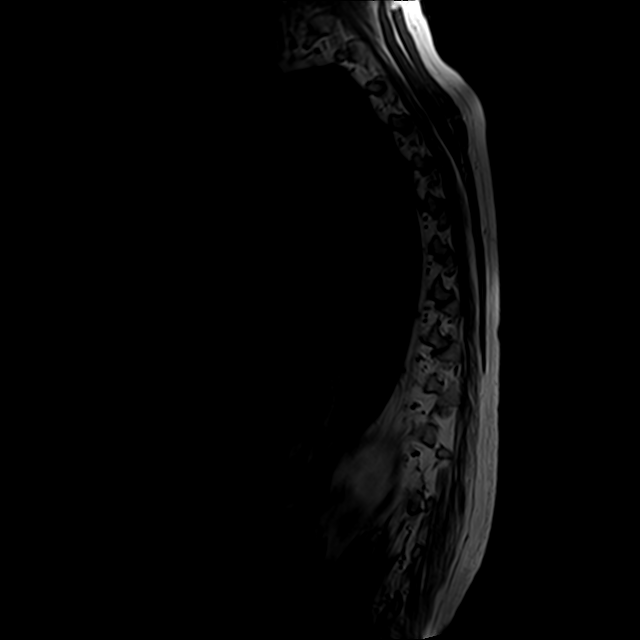

[Series 13: T2 · axial · 4.0mm · 0.39mm/px · z∈[-282,-139]mm · 5 of 30 slices shown (1 of 2)]
[im 1/30]
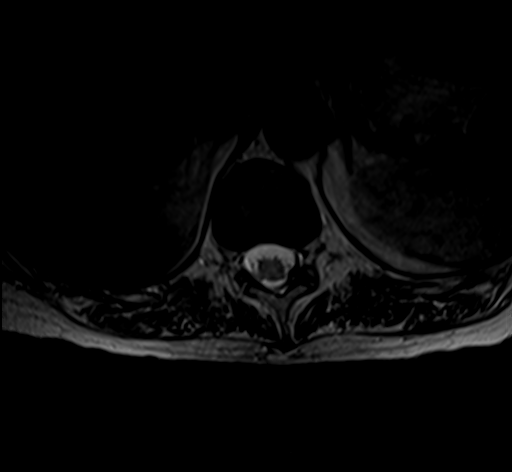
[im 5/30]
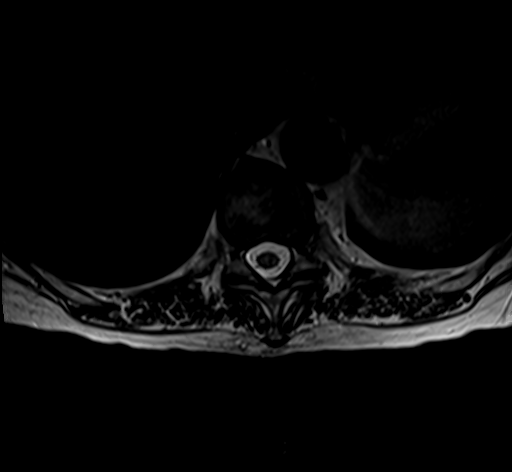
[im 9/30]
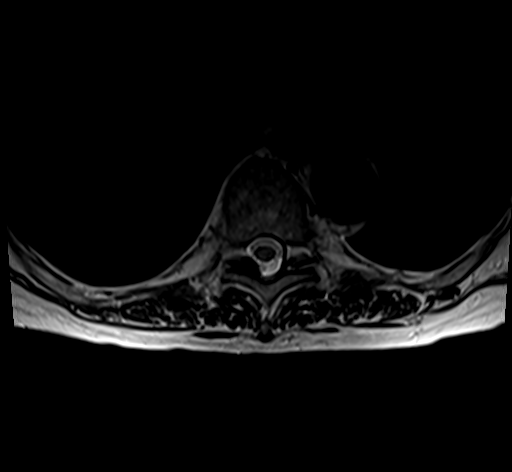
[im 17/30]
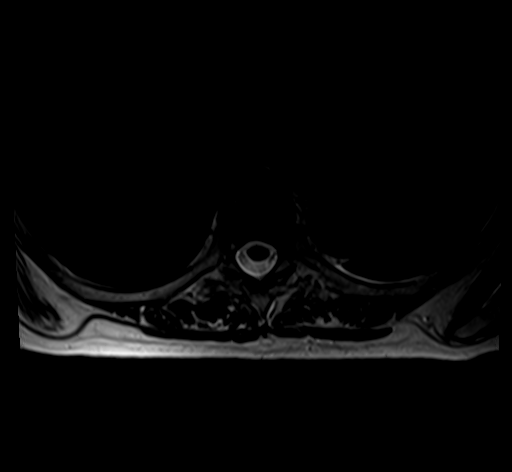
[im 25/30]
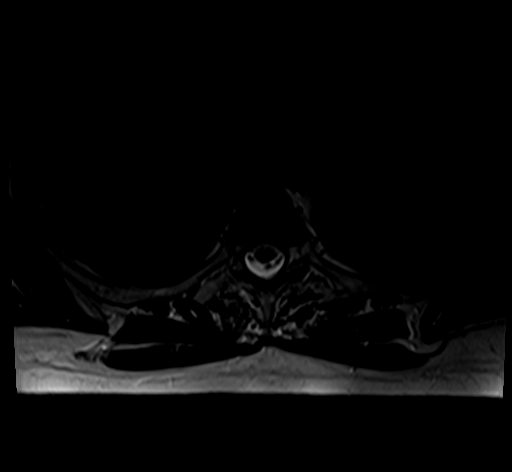

[Series 15: T1 · axial · 4.0mm · 0.39mm/px · z∈[-260,-139]mm · 3 of 30 slices shown (2 of 2)]
[im 5/30]
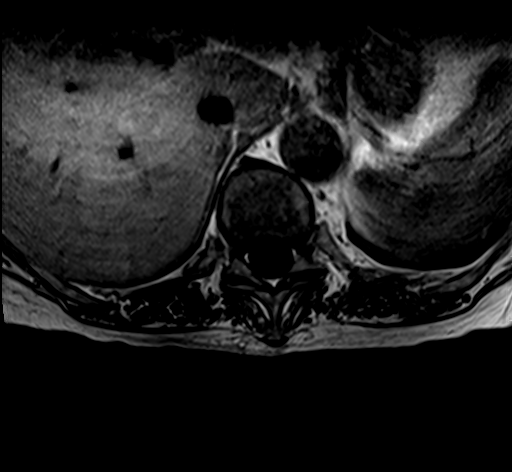
[im 17/30]
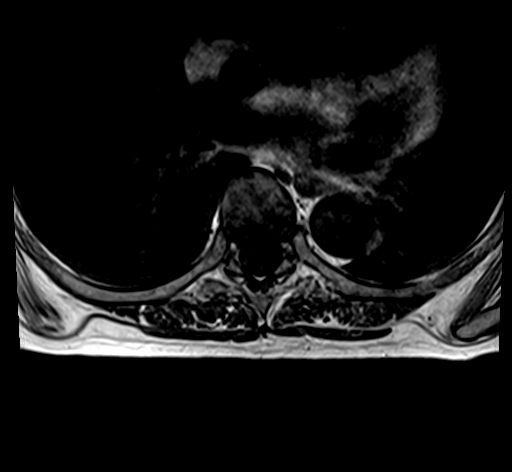
[im 25/30]
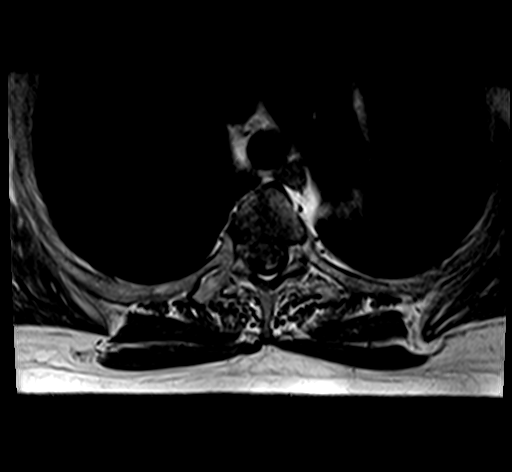

[Series 16: T2 · sagittal · 3.0mm · 0.52mm/px · 3 of 17 slices shown (2 of 2)]
[im 1/17]
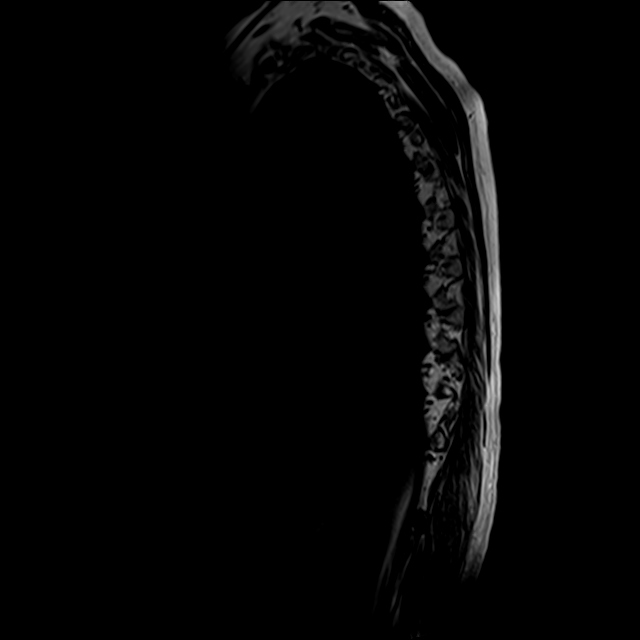
[im 11/17]
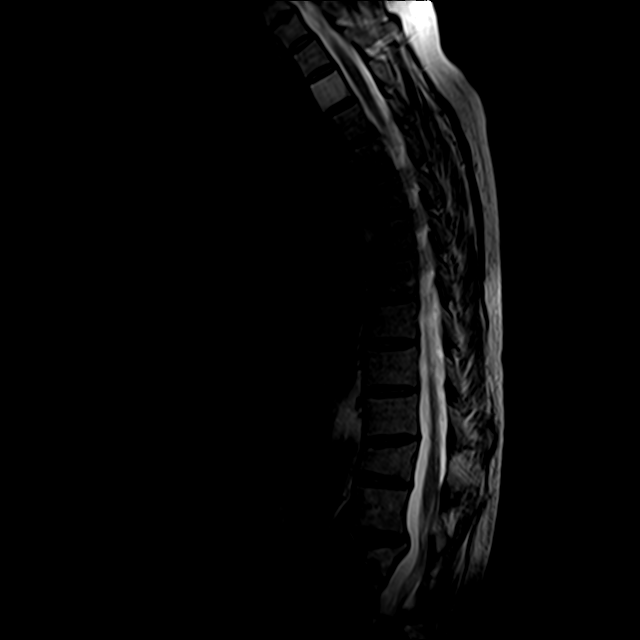
[im 17/17]
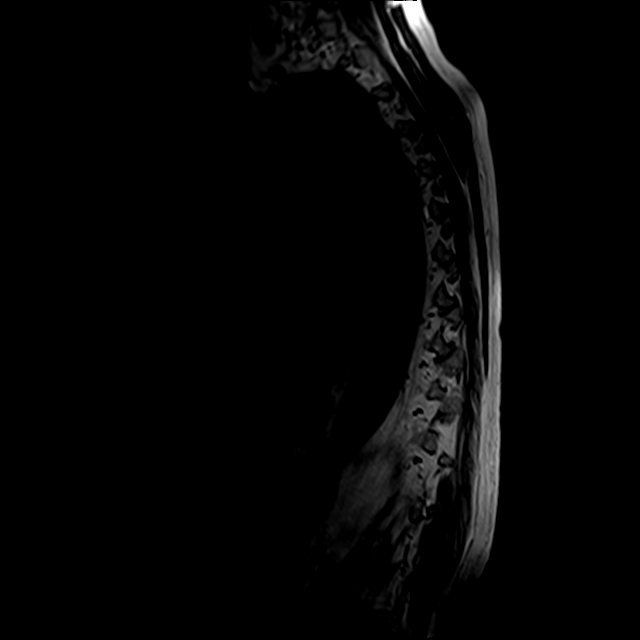

[14 of 48 positions shown; findings below may reference images not displayed]

FINDINGS: MRI CERVICAL SPINE FINDINGS

Alignment: Vertebral bodies normally aligned with preservation of
the normal cervical lordosis. No listhesis.

Vertebrae: Vertebral body height maintained without evidence for
acute or chronic fracture. Bone marrow signal intensity within
normal limits. Benign hemangioma largely replaces the T2 vertebral
body. No other discrete or worrisome osseous lesions. No abnormal
marrow edema or enhancement.

Cord: Extensive patchy T2 signal abnormality seen throughout the
cervical spinal cord, consistent with demyelinating disease/multiple
sclerosis. There is involvement of essentially all levels. For
reference purposes, the most prominent focus seen involving the
central/dorsal cord at the level of C3-4 (series 6, image 11).
Additionally, faint patchy postcontrast enhancement seen about a
small lesion involving the left hemi cord at the level of C4,
consistent with active demyelination (series 20, image 13).
Additional subtle patchy enhancement seen involving a lesion at the
right hemi cord at the level of C7 (series 20, image 23). No other
abnormal enhancement. Underlying cord caliber remains within normal
limits.

Posterior Fossa, vertebral arteries, paraspinal tissues: Extensive
T2/STIR signal abnormality seen involving the brainstem and
partially visualized brain, presumably related to history of
multiple sclerosis. Craniocervical junction within normal limits.
Paraspinous and prevertebral soft tissues within normal limits.
Normal flow voids seen within the vertebral arteries bilaterally.

Disc levels:

C2-C3: Shallow central disc protrusion mildly indents the ventral
thecal sac. No significant spinal stenosis. Foramina remain patent.

C3-C4: Disc desiccation without disc bulge. No significant canal or
foraminal stenosis.

C4-C5:  Mild disc bulge.  No canal or foraminal stenosis.

C5-C6: Chronic intervertebral disc space narrowing with diffuse
degenerative disc osteophyte. Flattening and partial effacement of
the ventral thecal sac without significant spinal stenosis or cord
deformity. Mild bilateral C6 foraminal narrowing.

C6-C7: Chronic intervertebral disc space narrowing with diffuse disc
bulge and uncovertebral spurring. Mild flattening of the ventral
thecal sac without significant spinal stenosis. Foramina remain
patent.

C7-T1: Mild facet hypertrophy. Otherwise unremarkable. No stenosis.

MRI THORACIC SPINE FINDINGS

Alignment: Mild dextroscoliosis with straightening of the normal
midthoracic kyphosis. No listhesis or subluxation.

Vertebrae: Chronic compression deformity involving the T11 vertebral
body with up to approximately 50-60% height loss without significant
bony retropulsion. Additional chronic compression deformity
involving the L1 vertebral body with advanced at least 80% height
loss and 5 mm bony retropulsion. No significant spinal stenosis.
Patient appears to be status post vertebral augmentation at the T12
vertebral body without significant height loss. There is an acute to
subacute appearing burst type compression fracture extending through
the inferior endplate of L2 approximate 30% height loss and 4 mm
bony retropulsion, incompletely assessed on this exam. These
fractures are benign/mechanical in appearance.

Underlying bone marrow signal intensity within normal limits. Benign
hemangioma replaces the T2 vertebral body. No other discrete or
worrisome osseous lesions. No abnormal marrow edema or enhancement.

Cord: Scattered patchy signal abnormality seen at multiple levels
throughout the thoracic spinal cord, consistent with demyelinating
disease. No abnormal enhancement to suggest active demyelination.
Underlying cord caliber and morphology within normal limits. Conus
medullaris terminates at approximately the L1 level.

Paraspinal and other soft tissues: Paraspinous soft tissues within
normal limits. Partially visualized lungs are clear. Visualized
visceral structures within normal limits.

Disc levels:

No significant disc pathology seen within the thoracic spine. No
disc bulge or focal disc herniation. No significant canal or
foraminal stenosis.
IMPRESSION: MRI CERVICAL SPINE IMPRESSION:

1. Extensive patchy T2 signal abnormality throughout the cervical
spinal cord, consistent with demyelinating disease/multiple
sclerosis. Faint patchy postcontrast enhancement about a few lesions
AT C4 AND C7, consistent with active demyelination.
2. Mild multilevel cervical spondylosis without significant spinal
stenosis. Mild bilateral C6 foraminal narrowing.

MRI THORACIC SPINE IMPRESSION:

1. Patchy signal abnormality throughout much of the thoracic spinal
cord, consistent with demyelinating disease/multiple sclerosis. No
evidence for active demyelination within the thoracic spinal cord.
2. Acute to subacute compression fracture extending through the
inferior endplate of L2 with up to 40% height loss and 4 mm of bony
retropulsion, incompletely assessed on this exam. Finding could be
further evaluated with dedicated MRI of the lumbar spine as
clinically desired.
3. Additional chronic lower thoracic compression fractures as above.

## 2019-12-23 IMAGING — MR MR CERVICAL SPINE WO/W CM
4 of 8 series · 23 of 48 positions shown · IV contrast (multihance)
Comparison: None available.

CLINICAL DATA: Initial evaluation for multiple sclerosis, baseline
examination. No new symptoms.

EXAM:
MRI CERVICAL AND THORACIC SPINE WITHOUT AND WITH CONTRAST
TECHNIQUE: Multiplanar and multiecho pulse sequences of the cervical spine, to
include the craniocervical junction and cervicothoracic junction,
and the thoracic spine, were obtained without and with intravenous
contrast.
CONTRAST:  15mL MULTIHANCE GADOBENATE DIMEGLUMINE 529 MG/ML IV SOLN

[Series 2: T2 · sagittal · 3.0mm · 0.66mm/px · 4 of 15 slices shown (1 of 2)]
[im 1/15]
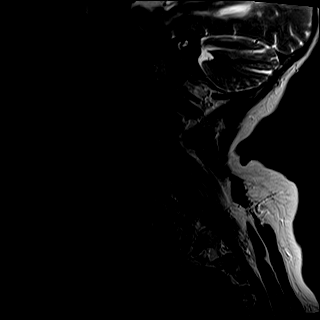
[im 5/15]
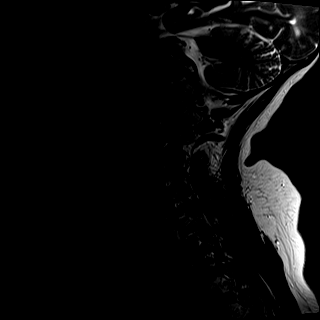
[im 10/15]
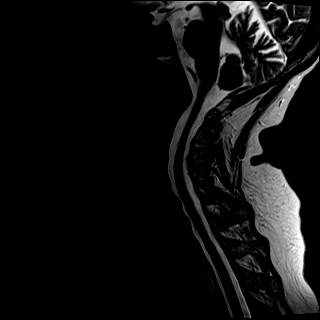
[im 15/15]
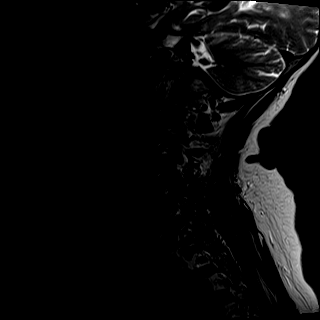

[Series 4: T1 · sagittal · 3.0mm · 0.41mm/px · 4 of 15 slices shown (1 of 2)]
[im 1/15]
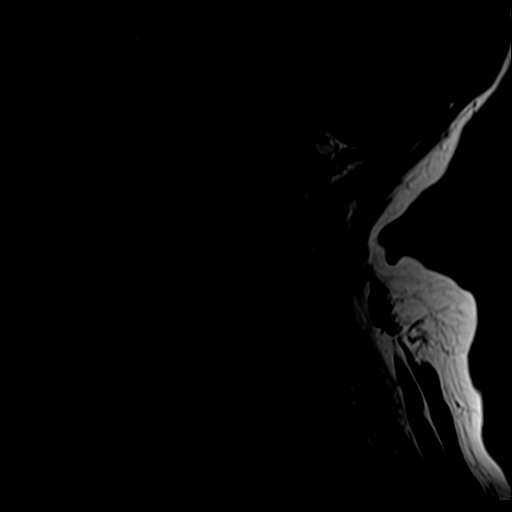
[im 5/15]
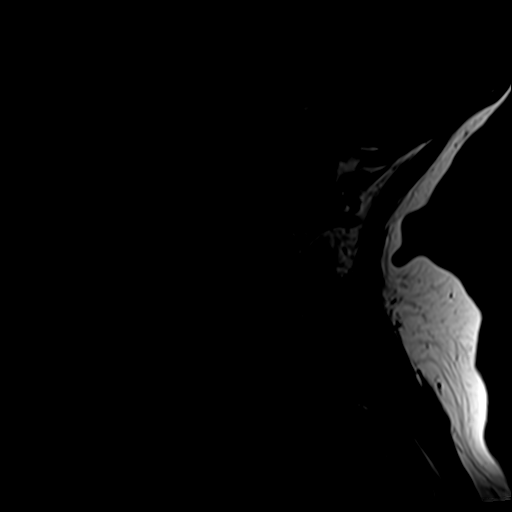
[im 10/15]
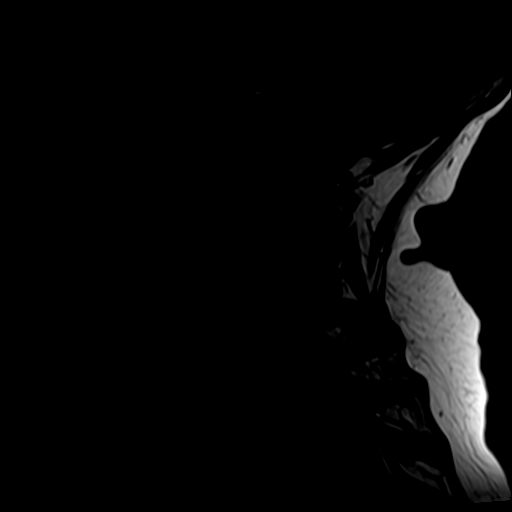
[im 15/15]
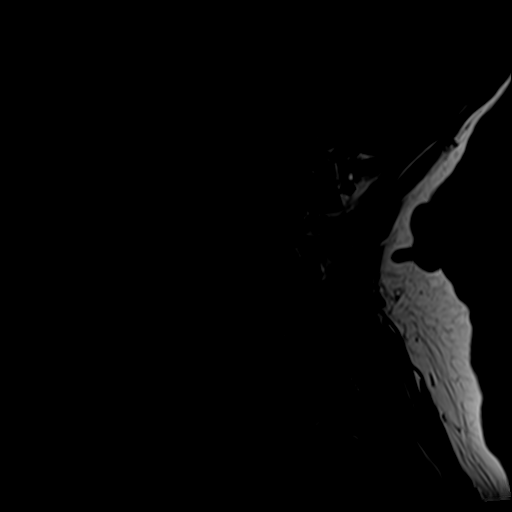

[Series 6: T2 · axial · 3.0mm · 0.70mm/px · z∈[-102,-9]mm · 8 of 28 slices shown (2 of 2)]
[im 1/28]
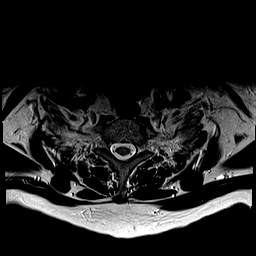
[im 4/28]
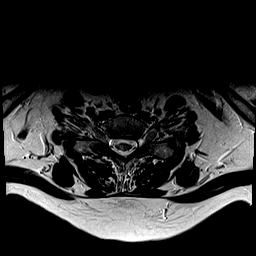
[im 8/28]
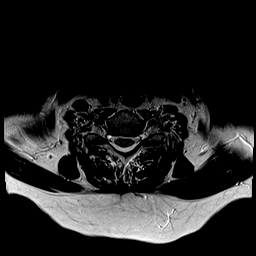
[im 12/28]
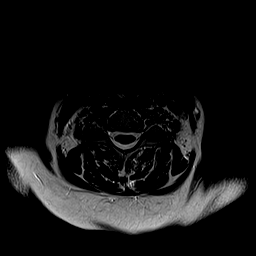
[im 16/28]
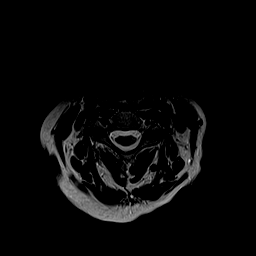
[im 20/28]
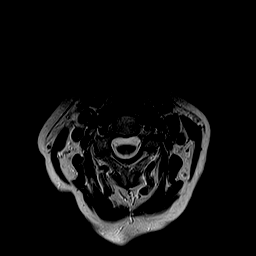
[im 24/28]
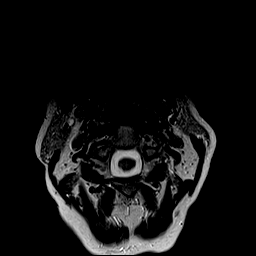
[im 28/28]
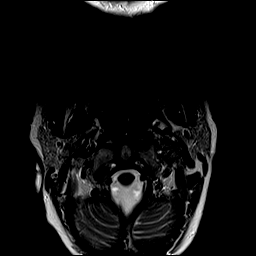

[Series 7: T1 · axial · non-contrast · 3.0mm · 0.35mm/px · z∈[-104,-22]mm · 7 of 28 slices shown (2 of 2)]
[im 1/28]
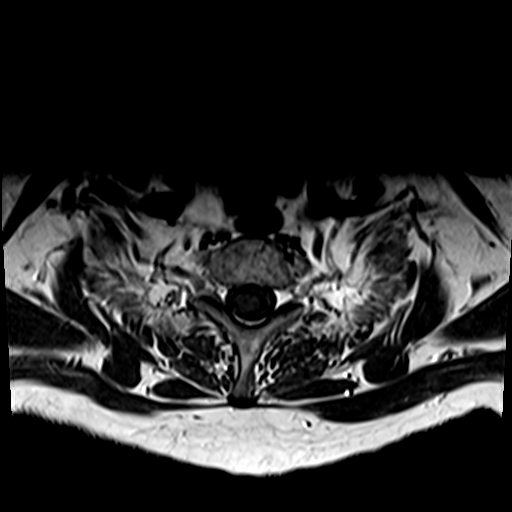
[im 4/28]
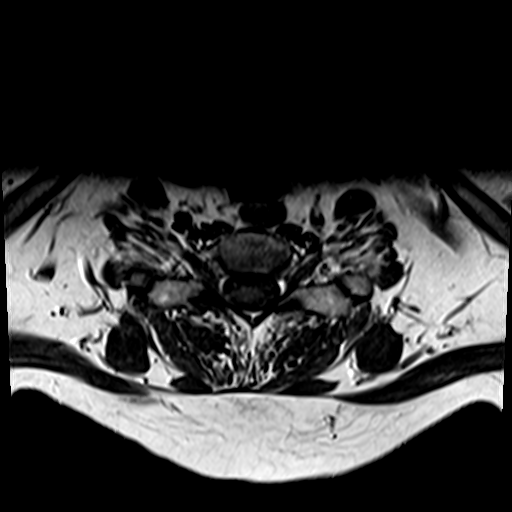
[im 8/28]
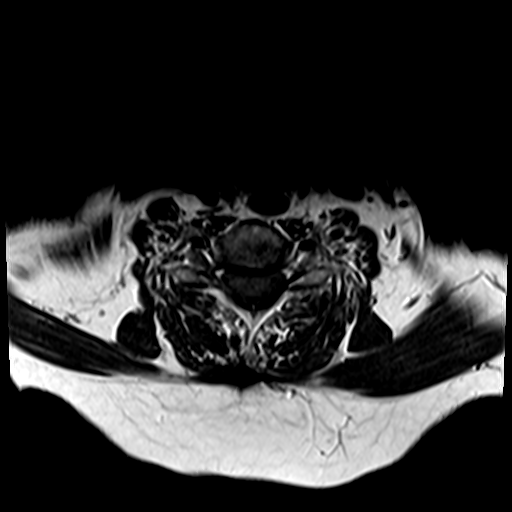
[im 12/28]
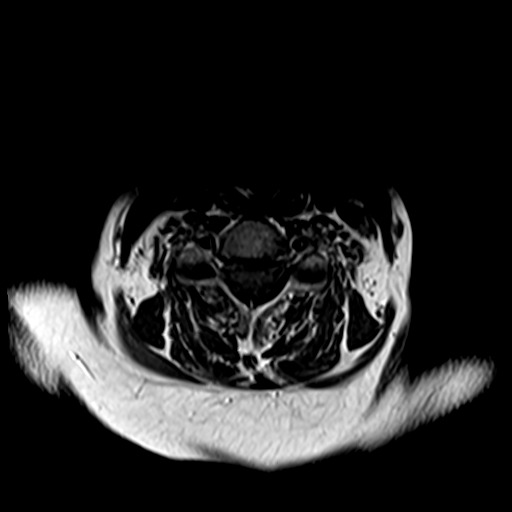
[im 16/28]
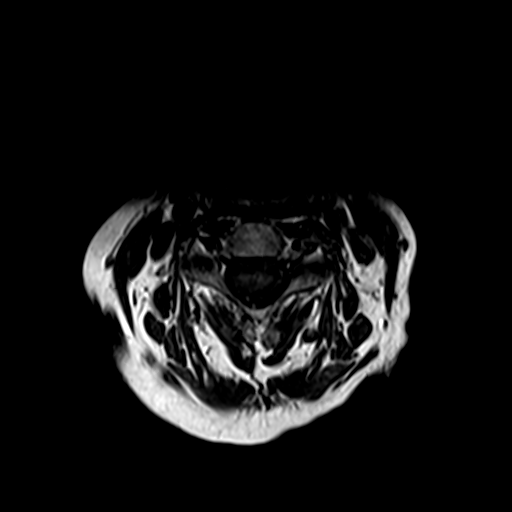
[im 20/28]
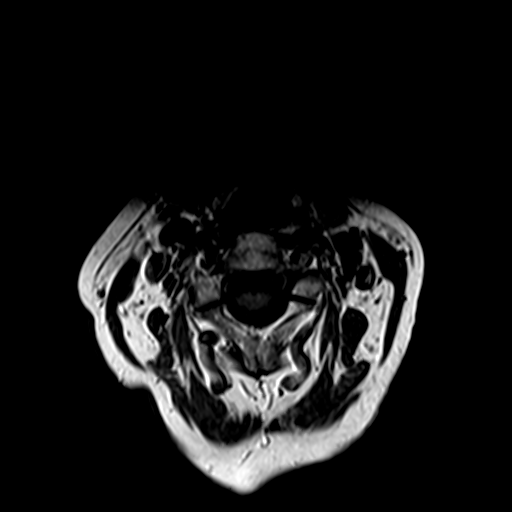
[im 24/28]
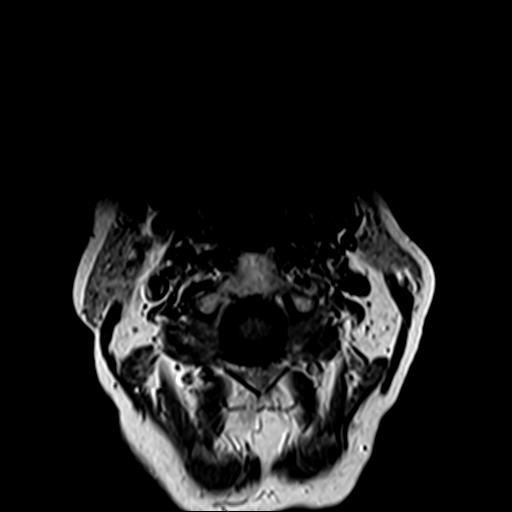

[23 of 48 positions shown; findings below may reference images not displayed]

FINDINGS: MRI CERVICAL SPINE FINDINGS

Alignment: Vertebral bodies normally aligned with preservation of
the normal cervical lordosis. No listhesis.

Vertebrae: Vertebral body height maintained without evidence for
acute or chronic fracture. Bone marrow signal intensity within
normal limits. Benign hemangioma largely replaces the T2 vertebral
body. No other discrete or worrisome osseous lesions. No abnormal
marrow edema or enhancement.

Cord: Extensive patchy T2 signal abnormality seen throughout the
cervical spinal cord, consistent with demyelinating disease/multiple
sclerosis. There is involvement of essentially all levels. For
reference purposes, the most prominent focus seen involving the
central/dorsal cord at the level of C3-4 (series 6, image 11).
Additionally, faint patchy postcontrast enhancement seen about a
small lesion involving the left hemi cord at the level of C4,
consistent with active demyelination (series 20, image 13).
Additional subtle patchy enhancement seen involving a lesion at the
right hemi cord at the level of C7 (series 20, image 23). No other
abnormal enhancement. Underlying cord caliber remains within normal
limits.

Posterior Fossa, vertebral arteries, paraspinal tissues: Extensive
T2/STIR signal abnormality seen involving the brainstem and
partially visualized brain, presumably related to history of
multiple sclerosis. Craniocervical junction within normal limits.
Paraspinous and prevertebral soft tissues within normal limits.
Normal flow voids seen within the vertebral arteries bilaterally.

Disc levels:

C2-C3: Shallow central disc protrusion mildly indents the ventral
thecal sac. No significant spinal stenosis. Foramina remain patent.

C3-C4: Disc desiccation without disc bulge. No significant canal or
foraminal stenosis.

C4-C5:  Mild disc bulge.  No canal or foraminal stenosis.

C5-C6: Chronic intervertebral disc space narrowing with diffuse
degenerative disc osteophyte. Flattening and partial effacement of
the ventral thecal sac without significant spinal stenosis or cord
deformity. Mild bilateral C6 foraminal narrowing.

C6-C7: Chronic intervertebral disc space narrowing with diffuse disc
bulge and uncovertebral spurring. Mild flattening of the ventral
thecal sac without significant spinal stenosis. Foramina remain
patent.

C7-T1: Mild facet hypertrophy. Otherwise unremarkable. No stenosis.

MRI THORACIC SPINE FINDINGS

Alignment: Mild dextroscoliosis with straightening of the normal
midthoracic kyphosis. No listhesis or subluxation.

Vertebrae: Chronic compression deformity involving the T11 vertebral
body with up to approximately 50-60% height loss without significant
bony retropulsion. Additional chronic compression deformity
involving the L1 vertebral body with advanced at least 80% height
loss and 5 mm bony retropulsion. No significant spinal stenosis.
Patient appears to be status post vertebral augmentation at the T12
vertebral body without significant height loss. There is an acute to
subacute appearing burst type compression fracture extending through
the inferior endplate of L2 approximate 30% height loss and 4 mm
bony retropulsion, incompletely assessed on this exam. These
fractures are benign/mechanical in appearance.

Underlying bone marrow signal intensity within normal limits. Benign
hemangioma replaces the T2 vertebral body. No other discrete or
worrisome osseous lesions. No abnormal marrow edema or enhancement.

Cord: Scattered patchy signal abnormality seen at multiple levels
throughout the thoracic spinal cord, consistent with demyelinating
disease. No abnormal enhancement to suggest active demyelination.
Underlying cord caliber and morphology within normal limits. Conus
medullaris terminates at approximately the L1 level.

Paraspinal and other soft tissues: Paraspinous soft tissues within
normal limits. Partially visualized lungs are clear. Visualized
visceral structures within normal limits.

Disc levels:

No significant disc pathology seen within the thoracic spine. No
disc bulge or focal disc herniation. No significant canal or
foraminal stenosis.
IMPRESSION: MRI CERVICAL SPINE IMPRESSION:

1. Extensive patchy T2 signal abnormality throughout the cervical
spinal cord, consistent with demyelinating disease/multiple
sclerosis. Faint patchy postcontrast enhancement about a few lesions
AT C4 AND C7, consistent with active demyelination.
2. Mild multilevel cervical spondylosis without significant spinal
stenosis. Mild bilateral C6 foraminal narrowing.

MRI THORACIC SPINE IMPRESSION:

1. Patchy signal abnormality throughout much of the thoracic spinal
cord, consistent with demyelinating disease/multiple sclerosis. No
evidence for active demyelination within the thoracic spinal cord.
2. Acute to subacute compression fracture extending through the
inferior endplate of L2 with up to 40% height loss and 4 mm of bony
retropulsion, incompletely assessed on this exam. Finding could be
further evaluated with dedicated MRI of the lumbar spine as
clinically desired.
3. Additional chronic lower thoracic compression fractures as above.

## 2019-12-23 MED ORDER — GADOBENATE DIMEGLUMINE 529 MG/ML IV SOLN
15.0000 mL | Freq: Once | INTRAVENOUS | Status: AC | PRN
  Administered 2019-12-23: 15 mL via INTRAVENOUS

## 2019-12-25 ENCOUNTER — Telehealth: Payer: Self-pay

## 2019-12-25 NOTE — Telephone Encounter (Signed)
Vit d 50,000 units weekly, has been taken from previous Neurologist. PCP recommends you do it.

## 2019-12-26 ENCOUNTER — Other Ambulatory Visit: Payer: Self-pay | Admitting: Neurology

## 2019-12-26 ENCOUNTER — Encounter: Payer: Self-pay | Admitting: Neurology

## 2019-12-26 MED ORDER — VITAMIN D (ERGOCALCIFEROL) 1.25 MG (50000 UNIT) PO CAPS: 50000.0000 [IU] | ORAL_CAPSULE | ORAL | 5 refills | Status: DC

## 2019-12-26 NOTE — H&P (Signed)
Surgical History & Physical  Patient Name: Pamela Roman DOB: 1955/08/11  Surgery: Cataract extraction with intraocular lens implant phacoemulsification; Left Eye  Surgeon: Fabio Pierce MD Surgery Date:  01/01/2020 Pre-Op Date:  12/11/2019  HPI: [name] -Dr. Harvie Bridge referred for cataract eval 1. The patient complains of difficulty when viewing TV, reading closed caption, news scrolls on TV, which began 3 months ago. Both eyes are affected. The episode is constant and sudden. The condition's severity decreased since last visit and is moderate and worsening. Symptoms occur when the patient is reading. The condition is worse with daily activities. Patient unable to read TV captions at all. OS worse c/w OD. Patient does notice glare/halos with lights. Rarely drives at night. Patient concerned with if she is having a flare up of optic neuritis. HPI Completed by Dr. 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  Social   Never smoked   Medication Alendronate, Simvastatin, Vitamin D, Levothyroxine Sodium,   Sx/Procedures Tonsilectomy,   Drug Allergies  Codeine,   History & Physical: Heent:  Cataract, Left eye NECK: supple without bruits LUNGS: lungs clear to auscultation CV: regular rate and rhythm Abdomen: soft and non-tender  Impression & Plan: Assessment: 1.  COMBINED FORMS AGE RELATED CATARACT; Both Eyes (H25.813)  Plan: 1.  Cataract accounts for the patient's decreased vision. This visual impairment is not correctable with a tolerable change in glasses or contact lenses. Cataract surgery with an implantation of a new lens should significantly improve the visual and functional status of the patient. Discussed all risks, benefits,  alternatives, and potential complications. Discussed the procedures and recovery. Patient desires to have surgery. A-scan ordered and performed today for intra-ocular lens calculations. The surgery will be performed in order to improve vision for driving, reading, and for eye examinations. Recommend phacoemulsification with intra-ocular lens. Left Eye worse - first. Dilates well - shugarcaine by protocol.

## 2019-12-26 NOTE — Progress Notes (Signed)
Pamela Roman (Key: [id number]) Mayzent 0.25MG  tablets   Form [employer] Electronic PA Form [phone] NCPDP) Created 4 days ago Sent to Plan 2 hours ago Plan Response 2 hours ago Submit Clinical Questions Determination Favorable 40 minutes ago Message from Plan An active PA is already on file with expiration date of 11/28/2020. Please wait to resubmit request within 60 days of that expiration date to obtain a PA renewal.

## 2019-12-26 NOTE — Telephone Encounter (Signed)
done

## 2019-12-27 ENCOUNTER — Ambulatory Visit (HOSPITAL_COMMUNITY): Payer: Medicare Other

## 2019-12-27 ENCOUNTER — Telehealth: Payer: Self-pay | Admitting: Neurology

## 2019-12-27 NOTE — Telephone Encounter (Signed)
Telephone call back to [facility] at [phone] spoke to Edwin  Pt to have Maintance dose 1mg  : Dispense 4 bottles of 0.25 mg tab ( 28 tab/btle) Day 1 1 0.25 mg day 2 1x 0.25 mg,day 3 2 x 0.25 mg, day 4 3 x 0.25 mg, days 5 and everyday after 4 x 0.25 mg daily   Ongoing RX  Dispense 1 mg, Four 0.25mg  tab taken once daliy. Dispense 4 blt( 28 tab/blt) then 11 refills

## 2019-12-27 NOTE — Telephone Encounter (Signed)
Left message with the after hours service on 12-27-19 @ 12:07 pm  Caller states that she needs clarification on a RX  Please call  Treva @ [facility] @ [phone] EXT [phone]

## 2019-12-29 ENCOUNTER — Other Ambulatory Visit: Payer: Self-pay

## 2019-12-29 ENCOUNTER — Encounter (HOSPITAL_COMMUNITY)
Admission: RE | Admit: 2019-12-29 | Discharge: 2019-12-29 | Disposition: A | Discharge status: Home / Self Care | Payer: Medicare Other | Source: Ambulatory Visit | Attending: Ophthalmology | Admitting: Ophthalmology

## 2019-12-29 ENCOUNTER — Other Ambulatory Visit (HOSPITAL_COMMUNITY)
Admission: RE | Admit: 2019-12-29 | Discharge: 2019-12-29 | Disposition: A | Discharge status: Home / Self Care | Payer: Medicare Other | Source: Ambulatory Visit | Attending: Ophthalmology | Admitting: Ophthalmology

## 2019-12-29 DIAGNOSIS — Z01812 Encounter for preprocedural laboratory examination: Secondary | ICD-10-CM | POA: Insufficient documentation

## 2019-12-29 DIAGNOSIS — Z20822 Contact with and (suspected) exposure to covid-19: Secondary | ICD-10-CM | POA: Insufficient documentation

## 2019-12-30 LAB — SARS CORONAVIRUS 2 (TAT 6-24 HRS): SARS Coronavirus 2: NEGATIVE

## 2020-01-01 ENCOUNTER — Ambulatory Visit (HOSPITAL_COMMUNITY): Payer: Medicare Other | Admitting: Anesthesiology

## 2020-01-01 ENCOUNTER — Ambulatory Visit (HOSPITAL_COMMUNITY)
Admission: RE | Admit: 2020-01-01 | Discharge: 2020-01-01 | Disposition: A | Discharge status: Home / Self Care | Payer: Medicare Other | Attending: Ophthalmology | Admitting: Ophthalmology

## 2020-01-01 ENCOUNTER — Encounter (HOSPITAL_COMMUNITY): Payer: Self-pay | Admitting: Ophthalmology

## 2020-01-01 ENCOUNTER — Other Ambulatory Visit: Payer: Self-pay

## 2020-01-01 ENCOUNTER — Encounter (HOSPITAL_COMMUNITY)
Admission: RE | Disposition: A | Discharge status: Home / Self Care | Payer: Self-pay | Source: Home / Self Care | Attending: Ophthalmology

## 2020-01-01 ENCOUNTER — Telehealth: Payer: Self-pay | Admitting: Neurology

## 2020-01-01 DIAGNOSIS — I10 Essential (primary) hypertension: Secondary | ICD-10-CM | POA: Diagnosis not present

## 2020-01-01 DIAGNOSIS — Z79899 Other long term (current) drug therapy: Secondary | ICD-10-CM | POA: Insufficient documentation

## 2020-01-01 DIAGNOSIS — G35 Multiple sclerosis: Secondary | ICD-10-CM | POA: Diagnosis not present

## 2020-01-01 DIAGNOSIS — H25813 Combined forms of age-related cataract, bilateral: Secondary | ICD-10-CM | POA: Insufficient documentation

## 2020-01-01 DIAGNOSIS — Z79890 Hormone replacement therapy (postmenopausal): Secondary | ICD-10-CM | POA: Insufficient documentation

## 2020-01-01 DIAGNOSIS — Z7983 Long term (current) use of bisphosphonates: Secondary | ICD-10-CM | POA: Insufficient documentation

## 2020-01-01 DIAGNOSIS — E039 Hypothyroidism, unspecified: Secondary | ICD-10-CM | POA: Insufficient documentation

## 2020-01-01 HISTORY — PX: CATARACT EXTRACTION W/PHACO: SHX586

## 2020-01-01 SURGERY — PHACOEMULSIFICATION, CATARACT, WITH IOL INSERTION
Anesthesia: Monitor Anesthesia Care | Site: Eye | Laterality: Left

## 2020-01-01 MED ORDER — PHENYLEPHRINE HCL 2.5 % OP SOLN
1.0000 [drp] | OPHTHALMIC | Status: AC | PRN
  Administered 2020-01-01 (×3): 1 [drp] via OPHTHALMIC

## 2020-01-01 MED ORDER — SODIUM HYALURONATE 23 MG/ML IO SOLN
INTRAOCULAR | Status: DC | PRN
  Administered 2020-01-01: 0.6 mL via INTRAOCULAR

## 2020-01-01 MED ORDER — NEOMYCIN-POLYMYXIN-DEXAMETH 3.5-10000-0.1 OP SUSP
OPHTHALMIC | Status: DC | PRN
  Administered 2020-01-01: 1 [drp] via OPHTHALMIC

## 2020-01-01 MED ORDER — MIDAZOLAM HCL 2 MG/2ML IJ SOLN
INTRAMUSCULAR | Status: AC
  Filled 2020-01-01: qty 2

## 2020-01-01 MED ORDER — MIDAZOLAM HCL 2 MG/2ML IJ SOLN
INTRAMUSCULAR | Status: DC | PRN
  Administered 2020-01-01: 1 mg via INTRAVENOUS

## 2020-01-01 MED ORDER — EPINEPHRINE PF 1 MG/ML IJ SOLN
INTRAOCULAR | Status: DC | PRN
  Administered 2020-01-01: 500 mL

## 2020-01-01 MED ORDER — EPINEPHRINE PF 1 MG/ML IJ SOLN
INTRAMUSCULAR | Status: AC
  Filled 2020-01-01: qty 2

## 2020-01-01 MED ORDER — SUMATRIPTAN SUCCINATE 100 MG PO TABS: 100.0000 mg | ORAL_TABLET | ORAL | 2 refills | Status: DC | PRN

## 2020-01-01 MED ORDER — TETRACAINE HCL 0.5 % OP SOLN
1.0000 [drp] | OPHTHALMIC | Status: AC | PRN
  Administered 2020-01-01 (×3): 1 [drp] via OPHTHALMIC

## 2020-01-01 MED ORDER — BSS IO SOLN
INTRAOCULAR | Status: DC | PRN
  Administered 2020-01-01: 15 mL via INTRAOCULAR

## 2020-01-01 MED ORDER — LIDOCAINE HCL 3.5 % OP GEL
1.0000 "application " | Freq: Once | OPHTHALMIC | Status: AC
  Administered 2020-01-01: 1 via OPHTHALMIC

## 2020-01-01 MED ORDER — PROVISC 10 MG/ML IO SOLN
INTRAOCULAR | Status: DC | PRN
  Administered 2020-01-01: 0.85 mL via INTRAOCULAR

## 2020-01-01 MED ORDER — CYCLOPENTOLATE-PHENYLEPHRINE 0.2-1 % OP SOLN
1.0000 [drp] | OPHTHALMIC | Status: AC | PRN
  Administered 2020-01-01 (×3): 1 [drp] via OPHTHALMIC

## 2020-01-01 MED ORDER — LIDOCAINE HCL (PF) 1 % IJ SOLN
INTRAOCULAR | Status: DC | PRN
  Administered 2020-01-01: 1 mL via OPHTHALMIC

## 2020-01-01 MED ORDER — POVIDONE-IODINE 5 % OP SOLN
OPHTHALMIC | Status: DC | PRN
  Administered 2020-01-01: 1 via OPHTHALMIC

## 2020-01-01 SURGICAL SUPPLY — 12 items

## 2020-01-01 NOTE — Interval H&P Note (Signed)
History and Physical Interval Note:  01/01/2020 11:53 AM  Pamela Roman  has presented today for surgery, with the diagnosis of Nuclear sclerotic cataract - Left eye.  The various methods of treatment have been discussed with the patient and family. After consideration of risks, benefits and other options for treatment, the patient has consented to  Procedure(s) with comments: CATARACT EXTRACTION PHACO AND INTRAOCULAR LENS PLACEMENT LEFT EYE (Left) - CDE:  as a surgical intervention.  The patient's history has been reviewed, patient examined, no change in status, stable for surgery.  I have reviewed the patient's chart and labs.  Questions were answered to the patient's satisfaction.     Fabio Pierce

## 2020-01-01 NOTE — Anesthesia Preprocedure Evaluation (Signed)
Anesthesia Evaluation  Patient identified by MRN, date of birth, ID band Patient awake    Reviewed: Allergy & Precautions, H&P , NPO status , Patient's Chart, lab work & pertinent test results, reviewed documented beta blocker date and time   Airway Mallampati: II  TM Distance: >3 FB Neck ROM: full    Dental no notable dental hx.    Pulmonary neg pulmonary ROS,    Pulmonary exam normal breath sounds clear to auscultation       Cardiovascular Exercise Tolerance: Good negative cardio ROS   Rhythm:regular Rate:Normal     Neuro/Psych  Headaches, negative psych ROS   GI/Hepatic negative GI ROS, Neg liver ROS,   Endo/Other  Hypothyroidism   Renal/GU negative Renal ROS  negative genitourinary   Musculoskeletal   Abdominal   Peds  Hematology negative hematology ROS (+)   Anesthesia Other Findings   Reproductive/Obstetrics negative OB ROS                             Anesthesia Physical Anesthesia Plan  ASA: II  Anesthesia Plan: MAC   Post-op Pain Management:    Induction:   PONV Risk Score and Plan: 3  Airway Management Planned:   Additional Equipment:   Intra-op Plan:   Post-operative Plan:   Informed Consent: I have reviewed the patients History and Physical, chart, labs and discussed the procedure including the risks, benefits and alternatives for the proposed anesthesia with the patient or authorized representative who has indicated his/her understanding and acceptance.     Dental Advisory Given  Plan Discussed with: CRNA  Anesthesia Plan Comments:         Anesthesia Quick Evaluation

## 2020-01-01 NOTE — Transfer of Care (Signed)
Immediate Anesthesia Transfer of Care Note  Patient: Pamela Roman  Procedure(s) Performed: CATARACT EXTRACTION PHACO AND INTRAOCULAR LENS PLACEMENT LEFT EYE (Left Eye)  Patient Location: Short Stay  Anesthesia Type:MAC  Level of Consciousness: awake, alert , oriented and patient cooperative  Airway & Oxygen Therapy: Patient Spontanous Breathing  Post-op Assessment: Report given to RN and Post -op Vital signs reviewed and stable  Post vital signs: Reviewed and stable  Last Vitals:  Vitals Value Taken Time  BP    Temp    Pulse    Resp    SpO2      Last Pain:  Vitals:   01/01/20 1056  TempSrc: Oral  PainSc: 0-No pain         Complications: No complications documented.

## 2020-01-01 NOTE — Telephone Encounter (Signed)
Patient called and requested a prescription for sumatriptan 100 MG to be taken as needed for headaches. She said her neurologist in [place] prescribed it for her previously. Patient's reports she has had a headache recently and used the rest of the old prescription up.  [facility] in [place]

## 2020-01-01 NOTE — Op Note (Signed)
Date of procedure: 01/01/20  Pre-operative diagnosis: Visually significant age-related combined cataract, Left Eye (H25.812)  Post-operative diagnosis: Visually significant age-related combined cataract, Left Eye (H25.812)  Procedure: Removal of cataract via phacoemulsification and insertion of intra-ocular lens Pamela Roman and [place]  +23.5D into the capsular bag of the Left Eye  Attending surgeon: Gerda Diss. Naomia Lenderman, MD, MA  Anesthesia: MAC, Topical Akten  Complications: None  Estimated Blood Loss: <31m (minimal)  Specimens: None  Implants: As above  Indications:  Visually significant age-related cataract, Left Eye  Procedure:  The patient was seen and identified in the pre-operative area. The operative eye was identified and dilated.  The operative eye was marked.  Topical anesthesia was administered to the operative eye.     The patient was then to the operative suite and placed in the supine position.  A timeout was performed confirming the patient, procedure to be performed, and all other relevant information.   The patient's face was prepped and draped in the usual fashion for intra-ocular surgery.  A lid speculum was placed into the operative eye and the surgical microscope moved into place and focused.  An inferotemporal paracentesis was created using a 20 gauge paracentesis blade.  Shugarcaine was injected into the anterior chamber.  Viscoelastic was injected into the anterior chamber.  A temporal clear-corneal main wound incision was created using a 2.441mmicrokeratome.  A continuous curvilinear capsulorrhexis was initiated using an irrigating cystitome and completed using capsulorrhexis forceps.  Hydrodissection and hydrodeliniation were performed.  Viscoelastic was injected into the anterior chamber.  A phacoemulsification handpiece and a chopper as a second instrument were used to remove the nucleus and epinucleus. The irrigation/aspiration handpiece was used to remove  any remaining cortical material.   The capsular bag was reinflated with viscoelastic, checked, and found to be intact.  The intraocular lens was inserted into the capsular bag.  The irrigation/aspiration handpiece was used to remove any remaining viscoelastic.  The clear corneal wound and paracentesis wounds were then hydrated and checked with Weck-Cels to be watertight.  The lid-speculum was removed.  The drape was removed.  The patient's face was cleaned with a wet and dry 4x4.   Maxitrol was instilled in the eye. A clear shield was taped over the eye. The patient was taken to the post-operative care unit in good condition, having tolerated the procedure well.  Post-Op Instructions: [facility] and will receive all other orders and instructions.

## 2020-01-01 NOTE — Discharge Instructions (Signed)
Please discharge patient when stable, will follow up today with Dr. Chereese Cilento at the [facility] [place] office immediately following discharge.  Leave shield in place until visit.  All paperwork with discharge instructions will be given at the office.  [facility] Address:  [address]  [place], [place] [address]  

## 2020-01-01 NOTE — Anesthesia Postprocedure Evaluation (Signed)
Anesthesia Post Note  Patient: Pamela Roman  Procedure(s) Performed: CATARACT EXTRACTION PHACO AND INTRAOCULAR LENS PLACEMENT LEFT EYE (Left Eye)  Patient location during evaluation: Short Stay Anesthesia Type: MAC Level of consciousness: awake and alert Pain management: pain level controlled Vital Signs Assessment: post-procedure vital signs reviewed and stable Respiratory status: spontaneous breathing Cardiovascular status: stable Postop Assessment: no apparent nausea or vomiting Anesthetic complications: no   No complications documented.   Last Vitals:  Vitals:   01/01/20 1056 01/01/20 1253  BP: (!) 134/91 (!) 148/81  Pulse: 81 74  Resp: 14 16  Temp: 36.6 C 36.8 C  SpO2: 100% 98%    Last Pain:  Vitals:   01/01/20 1253  TempSrc: Oral  PainSc: 0-No pain                 Everette Rank

## 2020-01-02 ENCOUNTER — Encounter (HOSPITAL_COMMUNITY): Payer: Self-pay | Admitting: Ophthalmology

## 2020-01-12 NOTE — H&P (Signed)
Surgical History & Physical  Patient Name: Pamela Roman DOB: [birth year]  Surgery: Cataract extraction with intraocular lens implant phacoemulsification; Right Eye   Surgeon: Fabio Pierce MD Surgery Date:  01/19/2020 Pre-Op Date:  01/11/2020  HPI: A 37 Yr. old female patient 1. 1. The patient complains of difficulty when viewing TV, reading closed caption, news scrolls on TV, which began 3 months ago. The right eye is affected. The episode is gradual. The condition's severity increased since last visit. Symptoms occur when the patient is inside and outside. The complaint is associated with glare. This is negatively affecting the patient's quality of life. 2. The patient is returning after cataract post-op. The left eye is affected. Status post cataract post-op, which began 1 week ago: Since the last visit, the affected area has no changes noted. The patient's vision is stable. Patient is following medication instructions. HPI was performed by Fabio Pierce .  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  Social   Never smoked   Medication Prednisolone-gatiflox-bromfenac,  Alendronate, Simvastatin, Vitamin D, Levothyroxine Sodium, Mayzent,   Sx/Procedures Phaco c IOL OS,  Tonsilectomy,   Drug Allergies  Codeine,   History & Physical: Heent:  Cataract, Right eye NECK: supple without bruits LUNGS: lungs clear to auscultation CV: regular rate and rhythm Abdomen: soft and non-tender  Impression & Plan: Assessment: 1.  CATARACT EXTRACTION STATUS; Left Eye (Z98.42) 2.  COMBINED FORMS AGE RELATED CATARACT; Both Eyes (H25.813) 3.  INTRAOCULAR LENS IOL (Z96.1)  Plan: 1.  1 week after cataract surgery. Doing well with improved vision and normal eye pressure. Call with  any problems or concerns. Continue Gati-Brom-Pred 2x/day for 3 more weeks. 2.  Cataract accounts for the patient's decreased vision. This visual impairment is not correctable with a tolerable change in glasses or contact lenses. Cataract surgery with an implantation of a new lens should significantly improve the visual and functional status of the patient. Discussed all risks, benefits, alternatives, and potential complications. Discussed the procedures and recovery. Patient desires to have surgery. A-scan ordered and performed today for intra-ocular lens calculations. The surgery will be performed in order to improve vision for driving, reading, and for eye examinations. Recommend phacoemulsification with intra-ocular lens. Right Eye. Surgery required to correct imbalance of vision. Dilates well - shugarcaine by protocol. 3.  Doing well since surgery

## 2020-01-17 ENCOUNTER — Ambulatory Visit (HOSPITAL_COMMUNITY): Payer: Self-pay

## 2020-01-17 ENCOUNTER — Other Ambulatory Visit: Payer: Self-pay

## 2020-01-17 ENCOUNTER — Ambulatory Visit (HOSPITAL_COMMUNITY)
Admission: RE | Admit: 2020-01-17 | Discharge: 2020-01-17 | Disposition: A | Discharge status: Home / Self Care | Payer: Medicare Other | Source: Ambulatory Visit | Attending: Family Medicine | Admitting: Family Medicine

## 2020-01-17 ENCOUNTER — Encounter (HOSPITAL_COMMUNITY)
Admission: RE | Admit: 2020-01-17 | Discharge: 2020-01-17 | Disposition: A | Discharge status: Home / Self Care | Payer: Medicare Other | Source: Ambulatory Visit | Attending: Ophthalmology | Admitting: Ophthalmology

## 2020-01-17 DIAGNOSIS — Z1231 Encounter for screening mammogram for malignant neoplasm of breast: Secondary | ICD-10-CM | POA: Insufficient documentation

## 2020-01-19 ENCOUNTER — Encounter (HOSPITAL_COMMUNITY)
Admission: RE | Disposition: A | Discharge status: Home / Self Care | Payer: Self-pay | Source: Ambulatory Visit | Attending: Ophthalmology

## 2020-01-19 ENCOUNTER — Ambulatory Visit (HOSPITAL_COMMUNITY)
Admission: RE | Admit: 2020-01-19 | Discharge: 2020-01-19 | Disposition: A | Discharge status: Home / Self Care | Payer: Medicare Other | Source: Ambulatory Visit | Attending: Ophthalmology | Admitting: Ophthalmology

## 2020-01-19 ENCOUNTER — Encounter (HOSPITAL_COMMUNITY): Payer: Self-pay | Admitting: Ophthalmology

## 2020-01-19 ENCOUNTER — Ambulatory Visit (HOSPITAL_COMMUNITY): Payer: Medicare Other | Admitting: Anesthesiology

## 2020-01-19 DIAGNOSIS — Z79899 Other long term (current) drug therapy: Secondary | ICD-10-CM | POA: Diagnosis not present

## 2020-01-19 DIAGNOSIS — E039 Hypothyroidism, unspecified: Secondary | ICD-10-CM | POA: Diagnosis not present

## 2020-01-19 DIAGNOSIS — M1990 Unspecified osteoarthritis, unspecified site: Secondary | ICD-10-CM | POA: Insufficient documentation

## 2020-01-19 DIAGNOSIS — H25811 Combined forms of age-related cataract, right eye: Secondary | ICD-10-CM | POA: Diagnosis not present

## 2020-01-19 HISTORY — PX: CATARACT EXTRACTION W/PHACO: SHX586

## 2020-01-19 SURGERY — PHACOEMULSIFICATION, CATARACT, WITH IOL INSERTION
Anesthesia: Monitor Anesthesia Care | Site: Eye | Laterality: Right

## 2020-01-19 MED ORDER — TETRACAINE HCL 0.5 % OP SOLN
1.0000 [drp] | OPHTHALMIC | Status: AC | PRN
  Administered 2020-01-19 (×3): 1 [drp] via OPHTHALMIC

## 2020-01-19 MED ORDER — LIDOCAINE HCL 3.5 % OP GEL
1.0000 "application " | Freq: Once | OPHTHALMIC | Status: AC
  Administered 2020-01-19: 1 via OPHTHALMIC

## 2020-01-19 MED ORDER — PROVISC 10 MG/ML IO SOLN
INTRAOCULAR | Status: DC | PRN
  Administered 2020-01-19: 0.85 mL via INTRAOCULAR

## 2020-01-19 MED ORDER — CYCLOPENTOLATE-PHENYLEPHRINE 0.2-1 % OP SOLN
1.0000 [drp] | OPHTHALMIC | Status: AC | PRN
  Administered 2020-01-19 (×3): 1 [drp] via OPHTHALMIC

## 2020-01-19 MED ORDER — LIDOCAINE HCL (PF) 1 % IJ SOLN
INTRAOCULAR | Status: DC | PRN
  Administered 2020-01-19: 1 mL via OPHTHALMIC

## 2020-01-19 MED ORDER — PHENYLEPHRINE HCL 2.5 % OP SOLN
1.0000 [drp] | OPHTHALMIC | Status: AC | PRN
  Administered 2020-01-19 (×3): 1 [drp] via OPHTHALMIC

## 2020-01-19 MED ORDER — MIDAZOLAM HCL 2 MG/2ML IJ SOLN
INTRAMUSCULAR | Status: DC | PRN
  Administered 2020-01-19: 1 mg via INTRAVENOUS

## 2020-01-19 MED ORDER — MIDAZOLAM HCL 2 MG/2ML IJ SOLN
INTRAMUSCULAR | Status: AC
  Filled 2020-01-19: qty 2

## 2020-01-19 MED ORDER — LACTATED RINGERS IV SOLN: INTRAVENOUS | Status: DC

## 2020-01-19 MED ORDER — POVIDONE-IODINE 5 % OP SOLN
OPHTHALMIC | Status: DC | PRN
  Administered 2020-01-19: 1 via OPHTHALMIC

## 2020-01-19 MED ORDER — EPINEPHRINE PF 1 MG/ML IJ SOLN
INTRAMUSCULAR | Status: AC
  Filled 2020-01-19: qty 2

## 2020-01-19 MED ORDER — SODIUM HYALURONATE 23 MG/ML IO SOLN
INTRAOCULAR | Status: DC | PRN
  Administered 2020-01-19: 0.6 mL via INTRAOCULAR

## 2020-01-19 MED ORDER — NEOMYCIN-POLYMYXIN-DEXAMETH 3.5-10000-0.1 OP SUSP
OPHTHALMIC | Status: DC | PRN
  Administered 2020-01-19: 1 [drp] via OPHTHALMIC

## 2020-01-19 MED ORDER — BSS IO SOLN
INTRAOCULAR | Status: DC | PRN
  Administered 2020-01-19: 15 mL via INTRAOCULAR

## 2020-01-19 MED ORDER — EPINEPHRINE PF 1 MG/ML IJ SOLN
INTRAOCULAR | Status: DC | PRN
  Administered 2020-01-19: 500 mL

## 2020-01-19 SURGICAL SUPPLY — 12 items

## 2020-01-19 NOTE — Op Note (Signed)
Date of procedure: 01/19/20  Pre-operative diagnosis:  Visually significant combined form age-related cataract, Right Eye (H25.811)  Post-operative diagnosis:  Visually significant combined form age-related cataract, Right Eye (H25.811)  Procedure: Removal of cataract via phacoemulsification and insertion of intra-ocular lens Wynetta Emery and [employer] [id number]  +24.5D into the capsular bag of the Right Eye  Attending surgeon: Gerda Diss. Lashelle Koy, MD, MA  Anesthesia: MAC, Topical Akten  Complications: None  Estimated Blood Loss: <83m (minimal)  Specimens: None  Implants: As above  Indications:  Visually significant age-related cataract, Right Eye  Procedure:  The patient was seen and identified in the pre-operative area. The operative eye was identified and dilated.  The operative eye was marked.  Topical anesthesia was administered to the operative eye.     The patient was then to the operative suite and placed in the supine position.  A timeout was performed confirming the patient, procedure to be performed, and all other relevant information.   The patient's face was prepped and draped in the usual fashion for intra-ocular surgery.  A lid speculum was placed into the operative eye and the surgical microscope moved into place and focused.  A superotemporal paracentesis was created using a 20 gauge paracentesis blade.  Shugarcaine was injected into the anterior chamber.  Viscoelastic was injected into the anterior chamber.  A temporal clear-corneal main wound incision was created using a 2.443mmicrokeratome.  A continuous curvilinear capsulorrhexis was initiated using an irrigating cystitome and completed using capsulorrhexis forceps.  Hydrodissection and hydrodeliniation were performed.  Viscoelastic was injected into the anterior chamber.  A phacoemulsification handpiece and a chopper as a second instrument were used to remove the nucleus and epinucleus. The irrigation/aspiration handpiece was  used to remove any remaining cortical material.   The capsular bag was reinflated with viscoelastic, checked, and found to be intact.  The intraocular lens was inserted into the capsular bag.  The irrigation/aspiration handpiece was used to remove any remaining viscoelastic.  The clear corneal wound and paracentesis wounds were then hydrated and checked with Weck-Cels to be watertight.  The lid-speculum was removed.  The drape was removed.  The patient's face was cleaned with a wet and dry 4x4.   Maxitrol was instilled in the eye. A clear shield was taped over the eye. The patient was taken to the post-operative care unit in good condition, having tolerated the procedure well.  Post-Op Instructions: [facility] and will receive all other orders and instructions.

## 2020-01-19 NOTE — Anesthesia Postprocedure Evaluation (Signed)
Anesthesia Post Note  Patient: KYRIANNA BARLETTA  Procedure(s) Performed: CATARACT EXTRACTION PHACO AND INTRAOCULAR LENS PLACEMENT RIGHT EYE (Right Eye)  Patient location during evaluation: Phase II Anesthesia Type: MAC Level of consciousness: awake, awake and alert, oriented and patient cooperative Pain management: pain level controlled Vital Signs Assessment: post-procedure vital signs reviewed and stable Respiratory status: spontaneous breathing, respiratory function stable and nonlabored ventilation Cardiovascular status: blood pressure returned to baseline and stable Anesthetic complications: no   No complications documented.   Last Vitals:  Vitals:   01/19/20 0814  BP: (!) 149/67  Pulse: 66  Resp: 18  Temp: 36.6 C  SpO2: 98%    Last Pain:  Vitals:   01/19/20 0814  TempSrc: Oral  PainSc:                  Farryn Linares L

## 2020-01-19 NOTE — Discharge Instructions (Signed)
Please discharge patient when stable, will follow up today with Dr. Braeson Rupe at the [facility] [place] office immediately following discharge.  Leave shield in place until visit.  All paperwork with discharge instructions will be given at the office.  [facility] Address:  [address]  [place], [place] [address]             Monitored Anesthesia Care, Care After These instructions provide you with information about caring for yourself after your procedure. Your health care provider may also give you more specific instructions. Your treatment has been planned according to current medical practices, but problems sometimes occur. Call your health care provider if you have any problems or questions after your procedure. What can I expect after the procedure? After your procedure, you may:  Feel sleepy for several hours.  Feel clumsy and have poor balance for several hours.  Feel forgetful about what happened after the procedure.  Have poor judgment for several hours.  Feel nauseous or vomit.  Have a sore throat if you had a breathing tube during the procedure. Follow these instructions at home: For at least 24 hours after the procedure:      Have a responsible adult stay with you. It is important to have someone help care for you until you are awake and alert.  Rest as needed.  Do not: ? Participate in activities in which you could fall or become injured. ? Drive. ? Use heavy machinery. ? Drink alcohol. ? Take sleeping pills or medicines that cause drowsiness. ? Make important decisions or sign legal documents. ? Take care of children on your own. Eating and drinking  Follow the diet that is recommended by your health care provider.  If you vomit, drink water, juice, or soup when you can drink without vomiting.  Make sure you have little or no nausea before eating solid foods. General instructions  Take over-the-counter and  prescription medicines only as told by your health care provider.  If you have sleep apnea, surgery and certain medicines can increase your risk for breathing problems. Follow instructions from your health care provider about wearing your sleep device: ? Anytime you are sleeping, including during daytime naps. ? While taking prescription pain medicines, sleeping medicines, or medicines that make you drowsy.  If you smoke, do not smoke without supervision.  Keep all follow-up visits as told by your health care provider. This is important. Contact a health care provider if:  You keep feeling nauseous or you keep vomiting.  You feel light-headed.  You develop a rash.  You have a fever. Get help right away if:  You have trouble breathing. Summary  For several hours after your procedure, you may feel sleepy and have poor judgment.  Have a responsible adult stay with you for at least 24 hours or until you are awake and alert. This information is not intended to replace advice given to you by your health care provider. Make sure you discuss any questions you have with your health care provider. Document Revised: 09/20/2017 Document Reviewed: 10/13/2015 Elsevier Patient Education  2020 Elsevier Inc.  

## 2020-01-19 NOTE — Interval H&P Note (Signed)
History and Physical Interval Note:  01/19/2020 8:49 AM  Pamela Roman  has presented today for surgery, with the diagnosis of Nuclear sclerotic cataract - Right eye.  The various methods of treatment have been discussed with the patient and family. After consideration of risks, benefits and other options for treatment, the patient has consented to  Procedure(s) with comments: CATARACT EXTRACTION PHACO AND INTRAOCULAR LENS PLACEMENT (IOC) (Right) - right as a surgical intervention.  The patient's history has been reviewed, patient examined, no change in status, stable for surgery.  I have reviewed the patient's chart and labs.  Questions were answered to the patient's satisfaction.     Fabio Pierce

## 2020-01-19 NOTE — Transfer of Care (Signed)
Immediate Anesthesia Transfer of Care Note  Patient: Pamela Roman  Procedure(s) Performed: CATARACT EXTRACTION PHACO AND INTRAOCULAR LENS PLACEMENT RIGHT EYE (Right Eye)  Patient Location: PACU  Anesthesia Type:MAC  Level of Consciousness: awake, alert , oriented and patient cooperative  Airway & Oxygen Therapy: Patient Spontanous Breathing  Post-op Assessment: Report given to RN and Post -op Vital signs reviewed and stable  Post vital signs: Reviewed and stable  Last Vitals:  Vitals Value Taken Time  BP 136/70   Temp    Pulse 63   Resp    SpO2 100%     Last Pain:  Vitals:   01/19/20 0814  TempSrc: Oral  PainSc:       Patients Stated Pain Goal: 7 (41/96/22 2979)  Complications: No complications documented.

## 2020-01-19 NOTE — Anesthesia Preprocedure Evaluation (Signed)
Anesthesia Evaluation  Patient identified by MRN, date of birth, ID band Patient awake    Reviewed: Allergy & Precautions, H&P , NPO status , Patient's Chart, lab work & pertinent test results, reviewed documented beta blocker date and time   Airway Mallampati: II  TM Distance: >3 FB Neck ROM: full    Dental no notable dental hx.    Pulmonary neg pulmonary ROS,    Pulmonary exam normal breath sounds clear to auscultation       Cardiovascular Exercise Tolerance: Good negative cardio ROS   Rhythm:regular Rate:Normal     Neuro/Psych  Headaches, negative psych ROS   GI/Hepatic negative GI ROS, Neg liver ROS,   Endo/Other  Hypothyroidism   Renal/GU negative Renal ROS  negative genitourinary   Musculoskeletal   Abdominal   Peds  Hematology negative hematology ROS (+)   Anesthesia Other Findings   Reproductive/Obstetrics negative OB ROS                             Anesthesia Physical  Anesthesia Plan  ASA: II  Anesthesia Plan: MAC   Post-op Pain Management:    Induction:   PONV Risk Score and Plan: 3  Airway Management Planned:   Additional Equipment:   Intra-op Plan:   Post-operative Plan:   Informed Consent: I have reviewed the patients History and Physical, chart, labs and discussed the procedure including the risks, benefits and alternatives for the proposed anesthesia with the patient or authorized representative who has indicated his/her understanding and acceptance.     Dental Advisory Given  Plan Discussed with: CRNA  Anesthesia Plan Comments:         Anesthesia Quick Evaluation

## 2020-01-22 ENCOUNTER — Other Ambulatory Visit: Payer: Self-pay | Admitting: Neurology

## 2020-01-22 ENCOUNTER — Encounter (HOSPITAL_COMMUNITY): Payer: Self-pay | Admitting: Ophthalmology

## 2020-01-22 ENCOUNTER — Telehealth: Payer: Self-pay | Admitting: Neurology

## 2020-01-22 MED ORDER — GABAPENTIN 100 MG PO CAPS: 100.0000 mg | ORAL_CAPSULE | Freq: Three times a day (TID) | ORAL | 5 refills | Status: DC

## 2020-01-22 NOTE — Telephone Encounter (Signed)
It sounds like an aggravation of her underlying numbness.  Unfortunately, people with MS may have chronic symptoms that can fluctuate.  If concern is acute flare up, we usually do not treat sensory symptoms like numbness and tingling, with steroids.  If it is painful or uncomfortable, then we can try a medication such as gabapentin

## 2020-01-22 NOTE — Telephone Encounter (Signed)
Patient would like Rx sent in pharmacy is correct. Thank you

## 2020-01-22 NOTE — Telephone Encounter (Signed)
Patient returned your call please call her back. 

## 2020-01-22 NOTE — Telephone Encounter (Signed)
Patient called in and left a voicemail wanting to speak with someone about some of her symptoms. She is having trouble with her lower legs and feet

## 2020-01-22 NOTE — Telephone Encounter (Signed)
No answer at 1150 01/22/2020.

## 2020-01-22 NOTE — Telephone Encounter (Signed)
Patient called and is having tingling in bilteral feet, started Saturday, the tingling is worse today,. No burning, just tingling.Has not missed any medications. Please advise on any suggestions per Patient.

## 2020-01-22 NOTE — Telephone Encounter (Signed)
I sent a prescription for gabapentin 100mg  three times daily

## 2020-01-22 NOTE — Telephone Encounter (Signed)
What does she want? 

## 2020-01-31 ENCOUNTER — Telehealth: Payer: Self-pay | Admitting: Neurology

## 2020-01-31 NOTE — Telephone Encounter (Signed)
Patient called in wanting to let Dr. Everlena Cooper know the Mazent she has been taking the last month or so is making her legs tingle worse than ever.

## 2020-02-01 ENCOUNTER — Other Ambulatory Visit: Payer: Self-pay

## 2020-02-01 DIAGNOSIS — G35 Multiple sclerosis: Secondary | ICD-10-CM

## 2020-02-01 NOTE — Progress Notes (Unsigned)
amb re 

## 2020-02-01 NOTE — Telephone Encounter (Signed)
Pt advised of Home health ordered added for SoluMedrol 1000mg  daily for 3 days.   And advised to try Mayzent for 6 mos and reelevated afterwards

## 2020-02-01 NOTE — Telephone Encounter (Signed)
I really doubt it is the Mayzent.  What does she want to do?

## 2020-02-01 NOTE — Telephone Encounter (Signed)
Pt states her PCP stated that somethime when you start something new your body trying to get use to the new medication. Do not walk right now, just do leg exercises. Pt states she had fallen at least five times.  Pt states she stop the Gabapentin she felt like it didn't help her at all. The numbness worse. Still having the numbness and tingling but no pain.   Pt also mentioned that she had to turn over driver license.   Pt just wanted to get your opinion.

## 2020-02-01 NOTE — Telephone Encounter (Signed)
I would give the Mayzent 6 months and re-evaluate as she has just started it.  For worsening numbness and tingling, we can set her up for SoluMedrol 1000mg  daily for 3 days.

## 2020-02-02 ENCOUNTER — Other Ambulatory Visit: Payer: Self-pay

## 2020-02-02 DIAGNOSIS — G35 Multiple sclerosis: Secondary | ICD-10-CM

## 2020-02-02 NOTE — Progress Notes (Signed)
Telephone call from [facility] health. They are unbale to process pt referral for home health for infusion.   New referral added

## 2020-02-06 ENCOUNTER — Other Ambulatory Visit: Payer: Self-pay

## 2020-02-06 ENCOUNTER — Telehealth: Payer: Self-pay | Admitting: Neurology

## 2020-02-06 MED ORDER — PREDNISONE 50 MG PO TABS: ORAL_TABLET | ORAL | 0 refills | Status: DC

## 2020-02-06 NOTE — Telephone Encounter (Signed)
Pt aware script sent to pharmacy. 

## 2020-02-06 NOTE — Telephone Encounter (Signed)
I got her on the wait list

## 2020-02-06 NOTE — Telephone Encounter (Signed)
High dose prednisone.  Prednisone 50mg  tablet:  10 tablets daily for days 1 to 3, then 8 tablets on day 4, then 6 tablets on day 5, then 4 tablets on day 6, then 2 tablets on day 7, then 1 tablet on day 8, then 1/2 tablet on day 9, then STOP.   Quantity 52, Refill 0.

## 2020-02-06 NOTE — Telephone Encounter (Signed)
Patient is inquiring on the status of her steroid iv infusion treatment? She states she was supposed to have treatment 3 days this week and she hasn't heard from them yet. Please call

## 2020-02-06 NOTE — Telephone Encounter (Signed)
Per. Everlena Cooper okay to be added to the wait list.      DR. Everlena Cooper pt wants to know if there is a Pill steroid she could take instead of the Solu medrol?

## 2020-02-15 ENCOUNTER — Telehealth: Payer: Self-pay | Admitting: Neurology

## 2020-02-15 NOTE — Telephone Encounter (Signed)
Patient states the steroid medication Dr Everlena Cooper put her on is helping and she is feeling much better. Wanted to let Dr Everlena Cooper know.

## 2020-02-22 ENCOUNTER — Telehealth: Payer: Self-pay | Admitting: Neurology

## 2020-02-22 NOTE — Telephone Encounter (Signed)
I think she would be considered eligible

## 2020-02-22 NOTE — Telephone Encounter (Signed)
Patient called in wanting to find out if Dr. Everlena Cooper would recommend her get a 3rd [place] COVID-19 vaccination? She was not sure given the medications she is on.

## 2020-02-22 NOTE — Telephone Encounter (Signed)
Pt advised of DR.Jaffe note.  

## 2020-03-06 ENCOUNTER — Other Ambulatory Visit: Payer: Self-pay

## 2020-03-06 MED ORDER — GABAPENTIN 100 MG PO CAPS: 100.0000 mg | ORAL_CAPSULE | Freq: Three times a day (TID) | ORAL | 5 refills | Status: DC

## 2020-03-06 MED ORDER — SUMATRIPTAN SUCCINATE 100 MG PO TABS: 100.0000 mg | ORAL_TABLET | ORAL | 2 refills | Status: DC | PRN

## 2020-03-21 NOTE — Progress Notes (Signed)
NEUROLOGY FOLLOW UP OFFICE NOTE  Pamela Roman [id number]  HISTORY OF PRESENT ILLNESS: Pamela Roman is a 64 year old left-handed white female who follows up for multiple sclerosis.  She is accompanied by her sister who supplements history.  UPDATE: Current DMT:  Mayzent. Other current medications:  Sertraline 25mg  daily; sumatriptan 100mg  PRN; D3 50000 IU weekly; levothyroxine; Aleve BID for pain, gabapentin 100mg  TID.  Saw patient in early May.  Plan was to start Mayzent.  During that process, she felt like that she was having a flare, describing increased difficulty walking and bilateral lower extremity numbness and tingling.  For paresthesias, she was started on gabapentin.  MRI of spinal cord showed acute lesions.  Started Mayzent in July but patient noted increased paresthesias in the legs.  She was prescribed high-dose prednisone.  Numbness and tingling improved but she continues to fall frequently, always in the house.  Migraines have increased, now one to two a week.  They abort quickly with sumatriptan.  She takes Aleve daily for chronic back pain.  12/23/2019 MRI CERVICAL SPINE W WO:  1. Extensive patchy T2 signal abnormality throughout the cervical spinal cord, consistent with demyelinating disease/multiple sclerosis. Faint patchy postcontrast enhancement about a few lesions.  AT C4 AND C7, consistent with active demyelination.  2. Mild multilevel cervical spondylosis without significant spinal stenosis. Mild bilateral C6 foraminal narrowing. 12/23/2019 MRI THORACIC SPINE W WO:  1. Patchy signal abnormality throughout much of the thoracic spinal cord, consistent with demyelinating disease/multiple sclerosis. No evidence for active demyelination within the thoracic spinal cord.  2. Acute to subacute compression fracture extending through the inferior endplate of L2 with up to 40% height loss and 4 mm of bony retropulsion, incompletely assessed on this exam. Finding could be further  evaluated with dedicated MRI of the lumbar spine as clinically desired.  3. Additional chronic lower thoracic compression fractures as above.  Vision:  No issues Motor:  Bilateral leg weakness.  Arms are okay. Sensory:  She reports numbness in feet and  in legs. Pain:  Low back pain.  Left hip pain.  Bilateral sharp stabbing pain in bilateral thighs.  She has degenerative disc disease of lumbar spine.   Headaches: Severe pressure-like bi-temporal/retro-orbital pain with nausea.  Responds to sumatriptan.  Increased frequency as above.   Gait:  Progressive gait decline.  Uses lift power scooter.  She needs assistance now to ambulate.   Bowel/Bladder:  No Fatigue:  yes Cognition:  Memory and cognitive deficits.  MOCA from 01/03/2019 reportedly 29/30. Mood:  Some depression.    She is on disability. She reports frequent bruises on her legs due to her falls.    HISTORY: She was diagnosed with multiple sclerosis in the early 1990s after experiencing right optic neuritis.  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  She underwent LP and MRIs, which were consistent with MS.  She initially started Betaseron and then Avonex until 2011, after which she was switched to Tysabri.  Within the last 5 years, she has had progression of disease, but particularly worse since moving here in March.  She has fallen 4 times.  She started using a walker about 5 years ago.    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 She then was receiving Solu-Medrol every month.  Imaging: 04/25/2019 MRI BRAIN W WO:  CEREBRUM:  Extensive multiple  sclerosis plaques are visualized.  These is seen adjacent to the tmporal horns bilaterally extending to surround the atria and occipital horns bilaterally and extensively throughout the periventricular white matter bilateral.  These also extend into the bifrontal corona  radiata and centrum semiovale.  No involvement of brainstem or cerebellum. 07/24/2019 MRI BRAIN W WO:  1.  Bilateral periventricular and radial white matter hyperintensities involving the frontal parietal and temporal regions bilaterally stable compared to 04/25/19.  2.  Compared to prior study the white matter hyperintensities, T1 low signal foci associated with these, and the absence of contrast enhancement appears stable.  3.  No evidence of new plaques or contrast enhancement to suggest new inflammatory foci.  Past:    PAST MEDICAL HISTORY: Past Medical History:  Diagnosis Date  . Hx of migraines   . Hyperglycemia   . Hypothyroidism   . Migraines   . Multiple sclerosis (HCC)     MEDICATIONS: Current Outpatient Medications on File Prior to Visit  Medication Sig Dispense Refill  . alendronate (FOSAMAX) 70 MG tablet Take 70 mg by mouth once a week. Take with a full glass of water on an empty stomach.    . gabapentin (NEURONTIN) 100 MG capsule Take 1 capsule (100 mg total) by mouth 3 (three) times daily. 90 capsule 5  . levothyroxine (SYNTHROID) 50 MCG tablet Take 50 mcg by mouth daily before breakfast.     . predniSONE (DELTASONE) 50 MG tablet 10 tablets daily for days 1 to 3, then 8 tablets on day 4, then 6 tablets on day 5, then 4 tablets on day 6, then 2 tablets on day 7, then 1 tablet on day 8, then 1/2 tablet on day 9, then STOP. 52 tablet 0  . sertraline (ZOLOFT) 25 MG tablet Take 25 mg by mouth daily.    . simvastatin (ZOCOR) 20 MG tablet Take 20 mg by mouth daily.    . Siponimod Fumarate (MAYZENT) 0.25 MG TABS Take 1 mg by mouth daily.    . SUMAtriptan (IMITREX) 100 MG tablet Take 1 tablet (100 mg total) by mouth every 2 (two) hours as needed for migraine. May repeat in 2 hours if headache persists or recurs. 10 tablet 2  . Vitamin D, Ergocalciferol, (DRISDOL) 1.25 MG (50000 UNIT) CAPS capsule Take 1 capsule (50,000 Units total) by mouth every 7 (seven) days. Sunday 5 capsule 5    No current facility-administered medications on file prior to visit.    ALLERGIES: Allergies  Allergen Reactions  . Codeine Nausea And Vomiting    FAMILY HISTORY: No family history on file.   SOCIAL HISTORY: Social History   Socioeconomic History  . Marital status: Widowed    Spouse name: Not on file  . Number of children: Not on file  . Years of education: Not on file  . Highest education level: Not on file  Occupational History  . Not on file  Tobacco Use  . Smoking status: Never Smoker  . Smokeless tobacco: Never Used  Vaping Use  . Vaping Use: Never used  Substance and Sexual Activity  . Alcohol use: Not Currently  . Drug use: Never  . Sexual activity: Not on file  Other Topics Concern  . Not on file  Social History Narrative   ** Merged History Encounter **       Lives with her sister two story home stays on the first floor Left handed   Social Determinants of Health   Financial Resource Strain:   .  Difficulty of Paying Living Expenses: Not on file  Food Insecurity:   . Worried About Programme researcher, broadcasting/film/video in the Last Year: Not on file  . Ran Out of Food in the Last Year: Not on file  Transportation Needs:   . Lack of Transportation (Medical): Not on file  . Lack of Transportation (Non-Medical): Not on file  Physical Activity:   . Days of Exercise per Week: Not on file  . Minutes of Exercise per Session: Not on file  Stress:   . Feeling of Stress : Not on file  Social Connections:   . Frequency of Communication with Friends and Family: Not on file  . Frequency of Social Gatherings with Friends and Family: Not on file  . Attends Religious Services: Not on file  . Active Member of Clubs or Organizations: Not on file  . Attends Banker Meetings: Not on file  . Marital Status: Not on file  Intimate Partner Violence:   . Fear of Current or Ex-Partner: Not on file  . Emotionally Abused: Not on file  . Physically Abused: Not on file  .  Sexually Abused: Not on file    PHYSICAL EXAM: Blood pressure 126/85, pulse 92, resp. rate 18, height 5\' 3"  (1.6 m), weight 140 lb (63.5 kg), SpO2 95 %. General: No acute distress.  Patient appears well-groomed.   Head:  Normocephalic/atraumatic Eyes:  Fundi examined but not visualized Neck: supple, no paraspinal tenderness, full range of motion Heart:  Regular rate and rhythm Lungs:  Clear to auscultation bilaterally Back: No paraspinal tenderness Neurological Exam: alert and oriented to person, place, and time. Attention span and concentration intact, recent and remote memory intact, fund of knowledge intact.  Speech fluent and not dysarthric, language intact.  Right afferent pupillary defect,right INO.  Otherwise, CN II-XII intact. Bulk and tone normal, muscle strength 3+/5 bilateral hp flexion, otherwise 5/5.  Sensation to pinprick and vibration reduced in lower extremities.  Deep tendon reflexes 2+ throughout, toes downgoing.  Finger to nose testing intact.  In wheelchair.  Gait deferred.  IMPRESSION: 1.  Multiple sclerosis 2.  Migraine without aura, without status migrainosus, not intractable  PLAN: 1.  To reduce migraine frequency, start zonisamide 25mg  titrating weekly to 100mg  daily. 2.  For migraine rescue, sumatriptan.  Limit use of pain relievers to no more than 2 days out of week to prevent risk of rebound or medication-overuse headache. 3.  For DMT, Mayzent. 4.  Check MRI of brain with and without contrast to evaluate for pontine lesion (as INO was not noted on exam by prior neurologist). 5.  Repeat MRI of brain, cervical, and thoracic spine with and without contrast in 6 months. 6.  Check CBC with diff and CMP.  Repeat labs in 6 months. 7.  Continue D3 50,000 IU weekly. 8.  Regarding bruising, advised to limit use of Aleve and follow up with PCP. 9.  Follow up in 6 months (after repeat tests)  , DO  CC: , FNP

## 2020-03-22 ENCOUNTER — Ambulatory Visit (INDEPENDENT_AMBULATORY_CARE_PROVIDER_SITE_OTHER): Payer: Medicare Other | Admitting: Neurology

## 2020-03-22 ENCOUNTER — Encounter: Payer: Self-pay | Admitting: Neurology

## 2020-03-22 ENCOUNTER — Other Ambulatory Visit: Payer: Self-pay

## 2020-03-22 VITALS — BP 126/85 | HR 92 | Resp 18 | Ht 63.0 in | Wt 140.0 lb

## 2020-03-22 DIAGNOSIS — G43009 Migraine without aura, not intractable, without status migrainosus: Secondary | ICD-10-CM | POA: Diagnosis not present

## 2020-03-22 DIAGNOSIS — G35 Multiple sclerosis: Secondary | ICD-10-CM | POA: Diagnosis not present

## 2020-03-22 MED ORDER — ZONISAMIDE 25 MG PO CAPS: ORAL_CAPSULE | ORAL | 0 refills | Status: DC

## 2020-03-22 NOTE — Patient Instructions (Addendum)
1.  To reduce migraines, start zonisamide 25mg . Increase dose as instructed. 2.  Use sumatriptan as directed for migraine attacks.  Limit use of pain relievers to no more than 2 days out of week to prevent risk of rebound or medication-overuse headache. 3.  Continue Mayzent. 4.  Check MRI of brain with and without contrast. We have sent a referral to [facility] for your MRI and they will call you directly to schedule your appointment. They are located at [address] [facility]. If you need to contact them directly please call [phone].  5.  Check MRI of brain/cervical/thoracic spine with and without contrast in 6 months 6.  Check CBC with diff and CMP Tuesday and in 6 months. Your provider has requested that you have labwork completed today. Please go to [facility] ([id number]) on the second floor of this building before leaving the office today. You do not need to check in. If you are not called within 15 minutes please check with the front desk.  7.  Will order home health assessment 8.  Follow up in 6 months (after repeat testing).

## 2020-03-29 ENCOUNTER — Other Ambulatory Visit: Payer: Self-pay

## 2020-03-29 ENCOUNTER — Telehealth: Payer: Self-pay | Admitting: Neurology

## 2020-03-29 DIAGNOSIS — G35 Multiple sclerosis: Secondary | ICD-10-CM

## 2020-03-29 DIAGNOSIS — G43009 Migraine without aura, not intractable, without status migrainosus: Secondary | ICD-10-CM

## 2020-03-29 MED ORDER — ZONISAMIDE 25 MG PO CAPS: ORAL_CAPSULE | ORAL | 0 refills | Status: DC

## 2020-03-30 LAB — COMPREHENSIVE METABOLIC PANEL
ALT: 25 IU/L (ref 0–32)
AST: 31 IU/L (ref 0–40)
Albumin/Globulin Ratio: 2 (ref 1.2–2.2)
Albumin: 4.3 g/dL (ref 3.8–4.8)
Alkaline Phosphatase: 109 IU/L (ref 44–121)
BUN/Creatinine Ratio: 31 — ABNORMAL HIGH (ref 12–28)
BUN: 20 mg/dL (ref 8–27)
Bilirubin Total: 0.5 mg/dL (ref 0.0–1.2)
CO2: 22 mmol/L (ref 20–29)
Calcium: 9.4 mg/dL (ref 8.7–10.3)
Chloride: 108 mmol/L — ABNORMAL HIGH (ref 96–106)
Creatinine, Ser: 0.64 mg/dL (ref 0.57–1.00)
GFR calc Af Amer: 110 mL/min/{1.73_m2} (ref >59)
GFR calc non Af Amer: 95 mL/min/{1.73_m2} (ref >59)
Globulin, Total: 2.1 g/dL (ref 1.5–4.5)
Glucose: 105 mg/dL — ABNORMAL HIGH (ref 65–99)
Potassium: 3.4 mmol/L — ABNORMAL LOW (ref 3.5–5.2)
Sodium: 145 mmol/L — ABNORMAL HIGH (ref 134–144)
Total Protein: 6.4 g/dL (ref 6.0–8.5)

## 2020-03-30 LAB — CBC WITH DIFFERENTIAL/PLATELET
Basophils Absolute: 0 10*3/uL (ref 0.0–0.2)
Basos: 0 %
EOS (ABSOLUTE): 0.3 10*3/uL (ref 0.0–0.4)
Eos: 4 %
Hematocrit: 41.3 % (ref 34.0–46.6)
Hemoglobin: 13.6 g/dL (ref 11.1–15.9)
Immature Grans (Abs): 0 10*3/uL (ref 0.0–0.1)
Immature Granulocytes: 0 %
Lymphocytes Absolute: 0.5 10*3/uL — ABNORMAL LOW (ref 0.7–3.1)
Lymphs: 6 %
MCH: 29.5 pg (ref 26.6–33.0)
MCHC: 32.9 g/dL (ref 31.5–35.7)
MCV: 90 fL (ref 79–97)
Monocytes Absolute: 0.7 10*3/uL (ref 0.1–0.9)
Monocytes: 10 %
Neutrophils Absolute: 5.6 10*3/uL (ref 1.4–7.0)
Neutrophils: 80 %
Platelets: 231 10*3/uL (ref 150–450)
RBC: 4.61 x10E6/uL (ref 3.77–5.28)
RDW: 13.1 % (ref 11.7–15.4)
WBC: 7 10*3/uL (ref 3.4–10.8)

## 2020-04-03 NOTE — Telephone Encounter (Signed)
Can close. Thank you

## 2020-04-03 NOTE — Telephone Encounter (Signed)
Has this been completed?  Sending to clinical staff for review: Okay to sign/close encounter or is further follow up needed? 

## 2020-04-04 ENCOUNTER — Other Ambulatory Visit: Payer: Self-pay

## 2020-04-04 ENCOUNTER — Telehealth: Payer: Self-pay

## 2020-04-04 NOTE — Telephone Encounter (Signed)
Pt advised to contact her PCP.

## 2020-04-04 NOTE — Telephone Encounter (Signed)
Pamela Roman

## 2020-04-04 NOTE — Telephone Encounter (Signed)
LMOVM, Refills sent into [facility] on 9/24

## 2020-04-04 NOTE — Telephone Encounter (Signed)
Pt states this week has been really bad. It feels like it could be  a sinus headache, she has No Appetite   she just feels sick.

## 2020-04-04 NOTE — Telephone Encounter (Signed)
Per pt she did pick up her script on 9/24, She will continue to use [facility] for right now.

## 2020-04-04 NOTE — Telephone Encounter (Signed)
Jaffe - Patient called in stating she has been having headaches every day. She is not sure if it's just getting used to taking the Zonisamide? She is up to 2 a day. She is having to take Aleve and Sudafed to get rid of the headaches.

## 2020-04-04 NOTE — Telephone Encounter (Signed)
Then she probably needs to contact PCP for sinus headache and if feeling sick as this sounds different than normal headaches that Dr. Everlena Cooper treats.

## 2020-04-05 ENCOUNTER — Other Ambulatory Visit: Payer: Self-pay | Admitting: Neurology

## 2020-04-09 ENCOUNTER — Telehealth: Payer: Self-pay | Admitting: Neurology

## 2020-04-09 MED ORDER — ZONISAMIDE 25 MG PO CAPS: ORAL_CAPSULE | ORAL | 0 refills | Status: DC

## 2020-04-14 ENCOUNTER — Ambulatory Visit
Admission: RE | Admit: 2020-04-14 | Discharge: 2020-04-14 | Disposition: A | Discharge status: Home / Self Care | Payer: Medicare Other | Source: Ambulatory Visit | Attending: Neurology | Admitting: Neurology

## 2020-04-14 DIAGNOSIS — G35 Multiple sclerosis: Secondary | ICD-10-CM

## 2020-04-14 IMAGING — MR MR HEAD WO/W CM
13 series · 48 of 48 positions shown · IV contrast (multihance)
Comparison: 13 mL MultiHance intravenous contrast.

CLINICAL DATA: Multiple sclerosis.

EXAM:
MRI HEAD WITHOUT AND WITH CONTRAST
TECHNIQUE: Multiplanar, multiecho pulse sequences of the brain and surrounding
structures were obtained without and with intravenous contrast.
CONTRAST:  13mL MULTIHANCE GADOBENATE DIMEGLUMINE 529 MG/ML IV SOLN

[Series 2: t1_se_sag · sagittal · 5.0mm · 0.45mm/px · 1 of 21 slices shown]
[im 1/21]
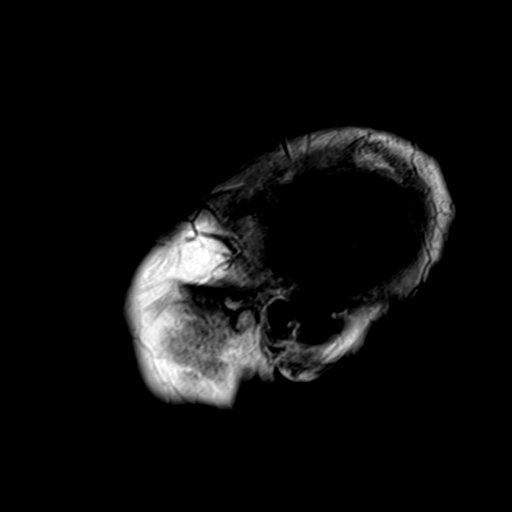

[Series 3: ep2d_diff_3 · axial · 3.0mm · 1.80mm/px · z∈[-58,+83]mm · 5 of 96 slices shown]
[im 1/96]
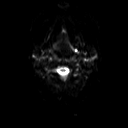
[im 24/96]
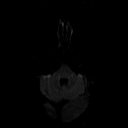
[im 48/96]
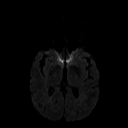
[im 72/96]
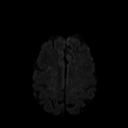
[im 96/96]
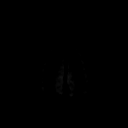

[Series 4: ep2d_diff_3_adc · axial · 3.0mm · 1.80mm/px · z∈[-58,+83]mm · 3 of 44 slices shown]
[im 1/44]
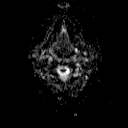
[im 22/44]
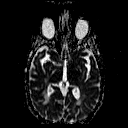
[im 44/44]
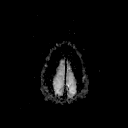

[Series 5: ep2d_diff_cor · coronal · 5.0mm · 1.77mm/px · 3 of 47 slices shown]
[im 1/47]
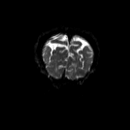
[im 24/47]
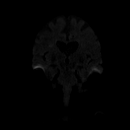
[im 47/47]
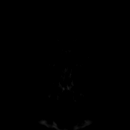

[Series 6: ep2d_diff_cor_adc · coronal · 5.0mm · 1.77mm/px · 2 of 24 slices shown]
[im 1/24]
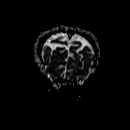
[im 24/24]
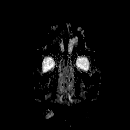

[Series 8: swi_images · axial · 2.0mm · 0.90mm/px · z∈[-59,+99]mm · 5 of 80 slices shown]
[im 1/80]
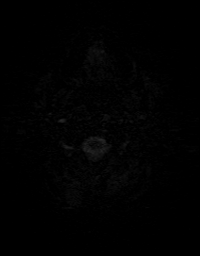
[im 20/80]
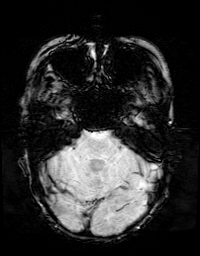
[im 40/80]
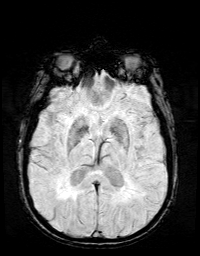
[im 60/80]
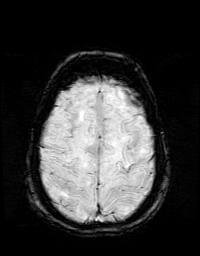
[im 80/80]
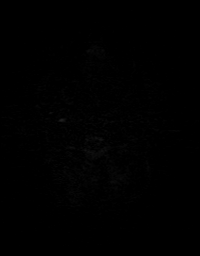

[Series 9: FLAIR · axial · 3.0mm · 0.43mm/px · z∈[-59,+97]mm · 2 of 27 slices shown (1 of 2)]
[im 1/27]
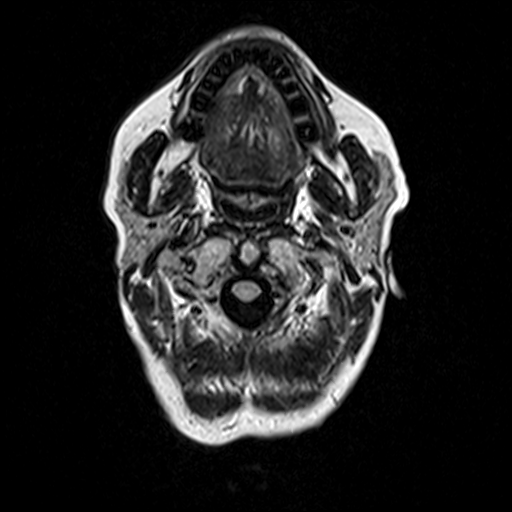
[im 27/27]
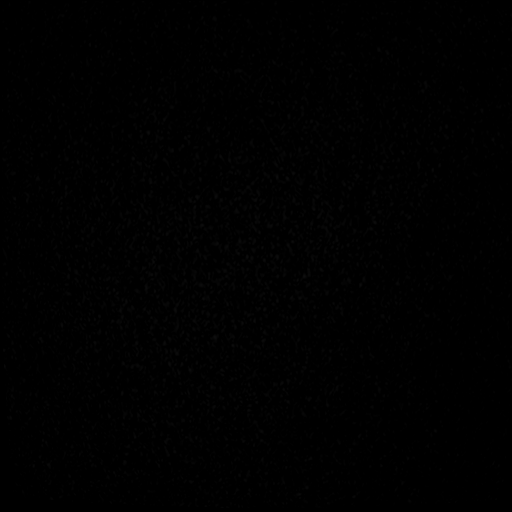

[Series 10: FLAIR · sagittal · 5.0mm · 0.45mm/px · 2 of 25 slices shown (2 of 2)]
[im 1/25]
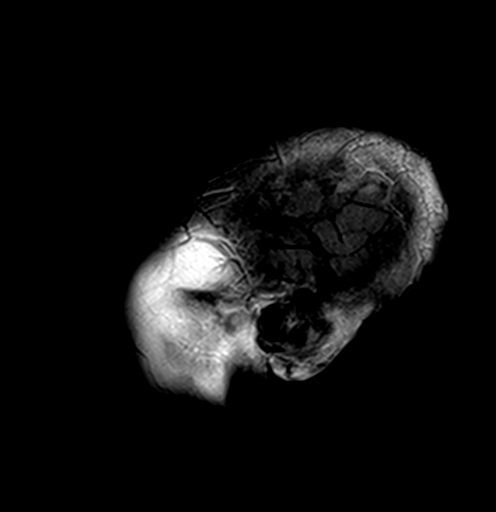
[im 25/25]
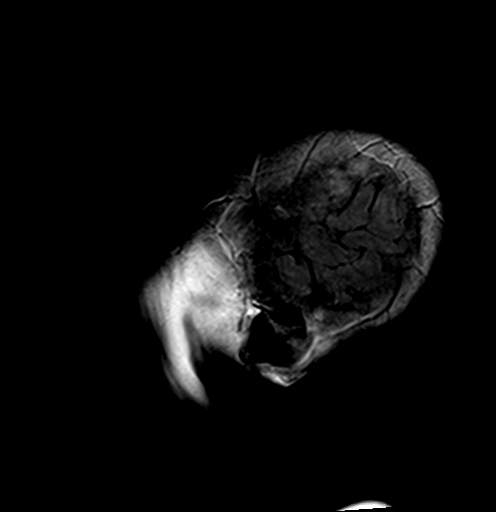

[Series 11: t2_tse_tra_512 · axial · 5.0mm · 0.60mm/px · 1 of 23 slices shown]
[im 1/23]
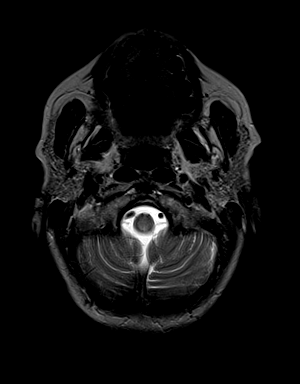

[Series 12: t1_mpr_tra · axial · 1.0mm · 0.72mm/px · z∈[-59,+100]mm · 10 of 160 slices shown]
[im 1/160]
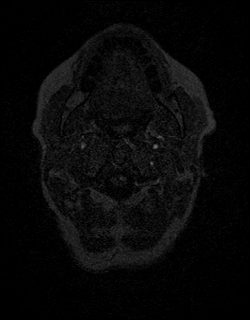
[im 18/160]
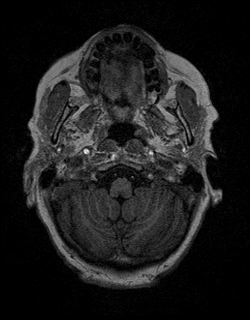
[im 36/160]
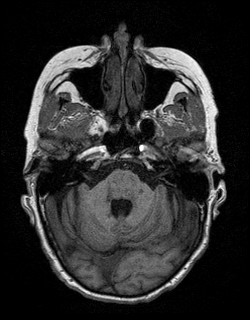
[im 54/160]
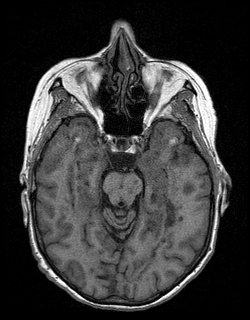
[im 71/160]
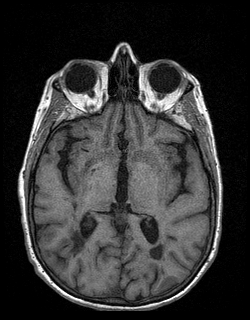
[im 89/160]
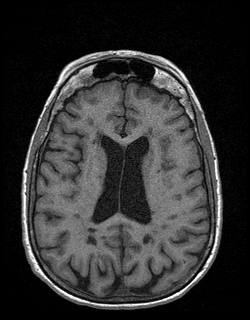
[im 107/160]
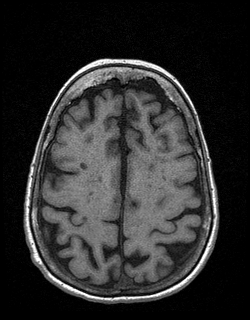
[im 124/160]
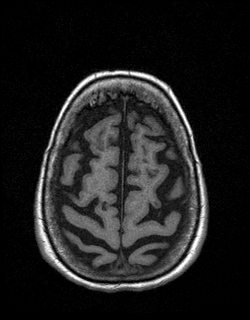
[im 142/160]
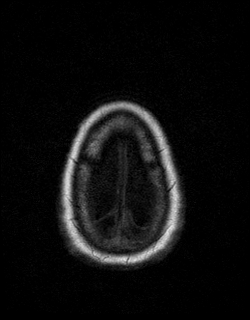
[im 160/160]
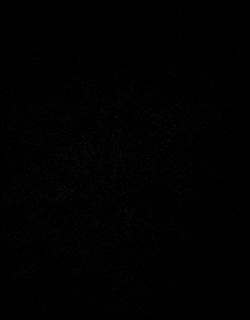

[Series 13: T2 · coronal · 5.0mm · 0.45mm/px · 2 of 26 slices shown]
[im 1/26]
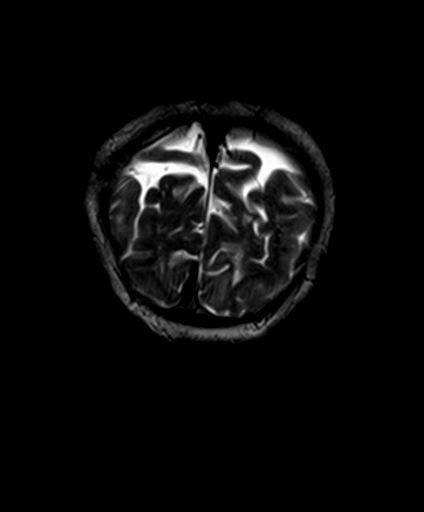
[im 26/26]
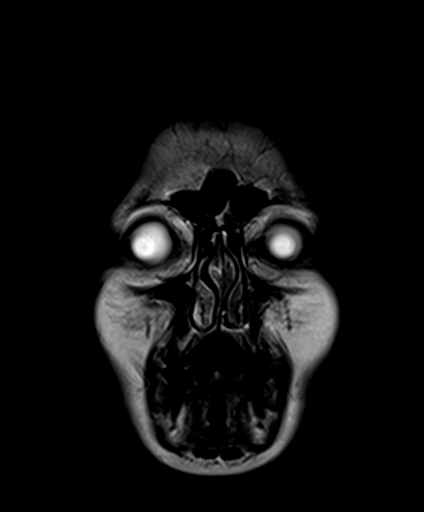

[Series 14: T1 post-contrast · coronal · 5.0mm · 0.72mm/px · 2 of 26 slices shown]
[im 1/26]
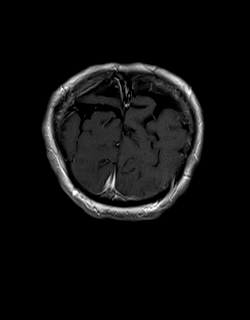
[im 26/26]
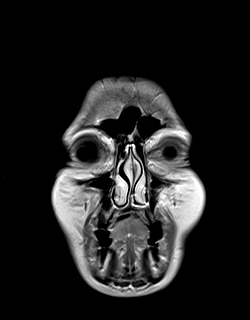

[Series 15: post t1_mpr_tra · axial · 1.0mm · 0.72mm/px · z∈[-59,+100]mm · 10 of 160 slices shown]
[im 1/160]
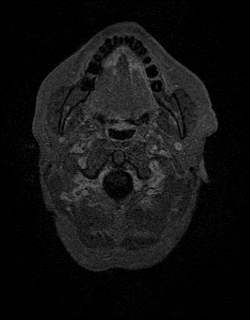
[im 18/160]
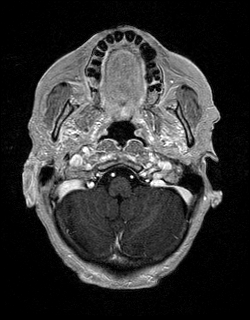
[im 36/160]
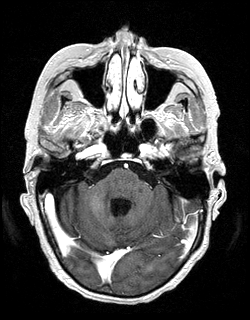
[im 54/160]
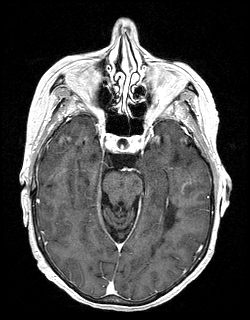
[im 71/160]
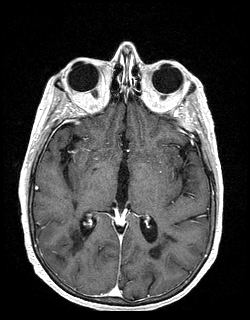
[im 89/160]
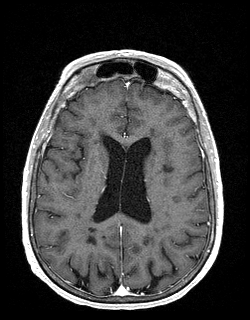
[im 107/160]
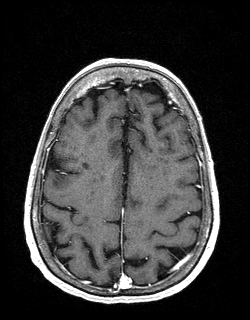
[im 124/160]
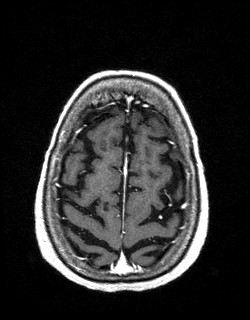
[im 142/160]
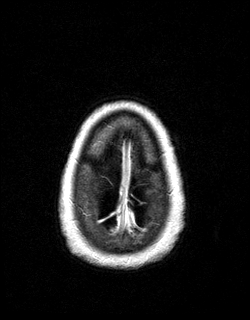
[im 160/160]
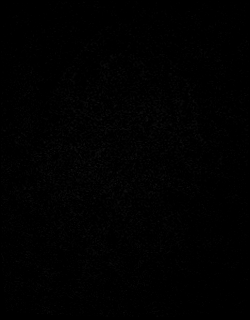

[48 of 48 positions shown; findings below may reference images not displayed]

FINDINGS: Brain:

There is mild generalized cerebral and cerebellar atrophy.
Additionally, there is volume loss affecting the corpus callosum.

There is severe multifocal T2/FLAIR hyperintense signal abnormality
within the subcortical/juxtacortical and deep periventricular white
matter. There is T2/FLAIR hyperintense signal abnormality within the
callososeptal interface. Additional multifocal T2/FLAIR
hyperintensity within the brainstem and cerebellar white matter.
These findings are consistent with the provided history multiple
sclerosis.

There is a subcentimeter focus of enhancement within the left
posterior medulla at site of a T2/FLAIR hyperintense lesion, and
findings are consistent with active demyelination (series 15, image
22).

There is no acute infarct.

No evidence of intracranial mass.

No chronic intracranial blood products.

No extra-axial fluid collection.

No midline shift.

Vascular: Expected proximal arterial flow voids.

Skull and upper cervical spine: No focal marrow lesion. Multifocal
T2/FLAIR hyperintense signal abnormality within the cervical spinal
cord, more completely characterized on the prior MRI of [DATE].

Sinuses/Orbits: Visualized orbits show no acute finding. Mild
ethmoid sinus mucosal thickening. No significant mastoid effusion.

These results will be called to the ordering clinician or
representative by the Radiologist Assistant, and communication
documented in the PACS or [REDACTED].
IMPRESSION: There is multifocal T2 hyperintense signal abnormality within the
cerebral white matter, callososeptal interface, brainstem and
cerebellar white matter. The multifocal signal abnormality affecting
the cerebral white matter is severe. Mild generalized cerebral and
cerebellar atrophy, with additional volume loss affecting the corpus
callosum. These findings are consistent with the provided history of
multiple sclerosis.

There is a subcentimeter focus of enhancement at site of a lesion
within the left posterior medulla, and findings are consistent with
active demyelination.

## 2020-04-14 MED ORDER — GADOBENATE DIMEGLUMINE 529 MG/ML IV SOLN
13.0000 mL | Freq: Once | INTRAVENOUS | Status: AC | PRN
  Administered 2020-04-14: 13 mL via INTRAVENOUS

## 2020-04-15 ENCOUNTER — Telehealth: Payer: Self-pay

## 2020-04-15 NOTE — Telephone Encounter (Signed)
Aware.  My response is under result note for the MRI.

## 2020-04-15 NOTE — Telephone Encounter (Signed)
Please have provider review MRI results. Needs his ATTN.

## 2020-04-21 ENCOUNTER — Other Ambulatory Visit: Payer: Commercial Indemnity

## 2020-04-28 ENCOUNTER — Other Ambulatory Visit: Payer: Self-pay | Admitting: Neurology

## 2020-04-29 ENCOUNTER — Other Ambulatory Visit: Payer: Self-pay | Admitting: Neurology

## 2020-04-29 MED ORDER — ZONISAMIDE 100 MG PO CAPS: 100.0000 mg | ORAL_CAPSULE | Freq: Every day | ORAL | 1 refills | Status: DC

## 2020-05-01 ENCOUNTER — Other Ambulatory Visit: Payer: Self-pay | Admitting: Neurology

## 2020-05-16 ENCOUNTER — Ambulatory Visit: Payer: Medicare Other | Admitting: Neurology

## 2020-06-19 ENCOUNTER — Telehealth: Payer: Self-pay | Admitting: Neurology

## 2020-06-19 DIAGNOSIS — G43009 Migraine without aura, not intractable, without status migrainosus: Secondary | ICD-10-CM

## 2020-06-19 MED ORDER — SUMATRIPTAN SUCCINATE 100 MG PO TABS: 100.0000 mg | ORAL_TABLET | ORAL | 0 refills | Status: DC | PRN

## 2020-06-24 ENCOUNTER — Telehealth: Payer: Self-pay | Admitting: Neurology

## 2020-06-24 NOTE — Telephone Encounter (Signed)
Patient returned call to Christy. 

## 2020-06-24 NOTE — Telephone Encounter (Signed)
Any suggestions>?

## 2020-06-24 NOTE — Telephone Encounter (Signed)
If she doesn't have an eye doctor then please refer to Dr. Alben Spittle at [place] for trouble seeing.

## 2020-06-24 NOTE — Telephone Encounter (Signed)
Patient called in stating she has been having a lot of trouble with her eyes. She cannot see to write a check. She can see far away fine since her cataracts were fixed. She is not sure if Dr. Everlena Cooper might want to refer her to an eye doctor? Her sister has told her it looks like her eyes are bulging out when she is trying to read something.

## 2020-06-24 NOTE — Telephone Encounter (Signed)
No answer at 256

## 2020-06-24 NOTE — Telephone Encounter (Signed)
Patient advised.

## 2020-07-02 ENCOUNTER — Other Ambulatory Visit: Payer: Self-pay | Admitting: Neurology

## 2020-08-09 ENCOUNTER — Encounter: Payer: Self-pay | Admitting: Internal Medicine

## 2020-08-09 ENCOUNTER — Ambulatory Visit (INDEPENDENT_AMBULATORY_CARE_PROVIDER_SITE_OTHER): Payer: Medicare Other | Admitting: Internal Medicine

## 2020-08-09 ENCOUNTER — Other Ambulatory Visit: Payer: Self-pay

## 2020-08-09 VITALS — BP 127/84 | HR 84 | Temp 97.7°F | Ht 63.0 in | Wt 128.0 lb

## 2020-08-09 DIAGNOSIS — F321 Major depressive disorder, single episode, moderate: Secondary | ICD-10-CM | POA: Insufficient documentation

## 2020-08-09 DIAGNOSIS — G43809 Other migraine, not intractable, without status migrainosus: Secondary | ICD-10-CM

## 2020-08-09 DIAGNOSIS — M810 Age-related osteoporosis without current pathological fracture: Secondary | ICD-10-CM

## 2020-08-09 DIAGNOSIS — E039 Hypothyroidism, unspecified: Secondary | ICD-10-CM | POA: Insufficient documentation

## 2020-08-09 DIAGNOSIS — Z124 Encounter for screening for malignant neoplasm of cervix: Secondary | ICD-10-CM | POA: Diagnosis not present

## 2020-08-09 DIAGNOSIS — Z1231 Encounter for screening mammogram for malignant neoplasm of breast: Secondary | ICD-10-CM

## 2020-08-09 DIAGNOSIS — R296 Repeated falls: Secondary | ICD-10-CM | POA: Insufficient documentation

## 2020-08-09 DIAGNOSIS — R634 Abnormal weight loss: Secondary | ICD-10-CM | POA: Insufficient documentation

## 2020-08-09 DIAGNOSIS — G35 Multiple sclerosis: Secondary | ICD-10-CM | POA: Insufficient documentation

## 2020-08-09 DIAGNOSIS — Z7689 Persons encountering health services in other specified circumstances: Secondary | ICD-10-CM

## 2020-08-09 DIAGNOSIS — E785 Hyperlipidemia, unspecified: Secondary | ICD-10-CM | POA: Insufficient documentation

## 2020-08-09 DIAGNOSIS — G43909 Migraine, unspecified, not intractable, without status migrainosus: Secondary | ICD-10-CM | POA: Insufficient documentation

## 2020-08-09 NOTE — Assessment & Plan Note (Signed)
Due to progressive MS leading to physical deconditioning Had PT recently Uses walker and scooter for ambulation

## 2020-08-09 NOTE — Progress Notes (Signed)
New Patient Office Visit  Subjective:  Patient ID: Pamela Roman, female    DOB: 01/27/1956  Age: 65 y.o. MRN: [id number]  CC:  Chief Complaint  Patient presents with  . New Patient (Initial Visit)    Here to establish care, no complaints today. Has MS, wants to see MD for primary care.    HPI Pamela Roman is a 65 year old female with past medical history of progressive multiple sclerosis, physical deconditioning, osteoporosis, multiple falls recently, hypothyroidism and depression who presents for establishing care.  She moved from [place] last year. She is a former patient of [facility].  She had MS flares in the last year and was placed on steroids.  She takes Mayzent for MS and follows up with neurologist.  She reports having multiple falls in the last year, last one in 02/2020.  She had no fractures, but has bruising over legs. Reports vision problems despite having cataract surgery. Has not had Neuro-Ophthalmology evaluation.  She reports weight loss - 50 lbs in 10 months. Denies fever, fatigue or night sweats. Denies cough, dyspnea, nausea, vomiting, constipation, diarrhea, melena or hematochezia. Due for Mammography. Referred to OB/GYN for PAP smear.  She is up-to-date with COVID vaccines. Does not take flu vaccine as her previous Neurologist had told her not to take flu and Pneumonia vaccines.  Past Medical History:  Diagnosis Date  . Depression    [employer]rees[employer] 08/06/2020  . Hx of migraines   . Hyperglycemia   . Hypothyroidism   . Migraines   . Multiple sclerosis (HCC)     Past Surgical History:  Procedure Laterality Date  . CATARACT EXTRACTION W/PHACO Left 01/01/2020   Procedure: CATARACT EXTRACTION PHACO AND INTRAOCULAR LENS PLACEMENT LEFT EYE;  Surgeon: Fabio Pierce, MD;  Location: [facility];  Service: Ophthalmology;  Laterality: Left;  CDE: 3.37  . CATARACT EXTRACTION W/PHACO Right 01/19/2020   Procedure: CATARACT EXTRACTION PHACO AND INTRAOCULAR LENS PLACEMENT RIGHT  EYE;  Surgeon: Fabio Pierce, MD;  Location: [facility];  Service: Ophthalmology;  Laterality: Right;  CDE: 3.16  . TONSILLECTOMY      History reviewed. No pertinent family history.  Social History   Socioeconomic History  . Marital status: Widowed    Spouse name: Not on file  . Number of children: Not on file  . Years of education: Not on file  . Highest education level: Not on file  Occupational History  . Not on file  Tobacco Use  . Smoking status: Never Smoker  . Smokeless tobacco: Never Used  Vaping Use  . Vaping Use: Never used  Substance and Sexual Activity  . Alcohol use: Not Currently  . Drug use: Never  . Sexual activity: Not on file  Other Topics Concern  . Not on file  Social History Narrative   ** Merged History Encounter **       Lives with her sister two story home stays on the first floor Left handed   Social Determinants of Health   Financial Resource Strain: Not on file  Food Insecurity: Not on file  Transportation Needs: Not on file  Physical Activity: Not on file  Stress: Not on file  Social Connections: Not on file  Intimate Partner Violence: Not on file    ROS Review of Systems  Constitutional: Positive for unexpected weight change. Negative for chills and fever.  HENT: Negative for congestion, sinus pain, sore throat and voice change.   Eyes: Positive for visual disturbance. Negative for pain.  Respiratory: Negative for cough and shortness of breath.   Cardiovascular: Negative for chest pain and palpitations.  Gastrointestinal: Negative for constipation, diarrhea, nausea and vomiting.  Endocrine: Negative for polydipsia and polyuria.  Genitourinary: Negative for dysuria and hematuria.  Musculoskeletal: Negative for neck pain and neck stiffness.  Skin: Negative for rash.  Neurological: Positive for numbness. Negative for dizziness.  Psychiatric/Behavioral: Negative for agitation and behavioral problems.    Objective:   Today's Vitals:  BP 127/84 (BP Location: Right Arm, Patient Position: Sitting, Cuff Size: Normal)   Pulse 84   Temp 97.7 F (36.5 C) (Temporal)   Ht 5\' 3"  (1.6 m)   Wt 128 lb (58.1 kg)   SpO2 99%   BMI 22.67 kg/m   Physical Exam Vitals reviewed.  Constitutional:      General: She is not in acute distress.    Appearance: She is not diaphoretic.  HENT:     Head: Normocephalic and atraumatic.     Nose: Nose normal.     Mouth/Throat:     Mouth: Mucous membranes are moist.  Eyes:     General: No scleral icterus.    Extraocular Movements: Extraocular movements intact.     Pupils: Pupils are equal, round, and reactive to light.  Cardiovascular:     Rate and Rhythm: Normal rate and regular rhythm.     Pulses: Normal pulses.     Heart sounds: Normal heart sounds. No murmur heard.   Pulmonary:     Breath sounds: Normal breath sounds. No wheezing or rales.  Abdominal:     Palpations: Abdomen is soft.     Tenderness: There is no abdominal tenderness.  Musculoskeletal:     Cervical back: Neck supple. No tenderness.     Right lower leg: No edema.     Left lower leg: No edema.  Skin:    General: Skin is warm.     Findings: Bruising (Right leg from fall) present. No rash.  Neurological:     General: No focal deficit present.     Mental Status: She is alert and oriented to person, place, and time.     Sensory: Sensory deficit (B/l LE) present.     Motor: Weakness (B/l UE and LE - 4/5) present.  Psychiatric:        Mood and Affect: Mood normal.        Behavior: Behavior normal.      Assessment & Plan:   Problem List Items Addressed This Visit      Cardiovascular and Mediastinum   Encounter to establish care   Care established Previous chart reviewed History and medications reviewed with the patient      Migraine    On Sumatriptan PRN On Zonisamide for ppx Follows up with Neurologist        Endocrine   Hypothyroidism    On Levothyroxine 50 mcg QD Obtain records from previous  PCP Check TSH in the next visit        Nervous and Auditory   Multiple sclerosis (HCC)    On Mayzent 1 mg QD Progressive neurologic deficits Has paresthesia in the legs, physical deconditioning leading to wheelchair use Follows up with Neurologist      Relevant Medications   Siponimod Fumarate 0.25 MG TABS     Musculoskeletal and Integument   Osteoporosis    On Alendronate Vitamin D 50,000 IU once weekly        Other         HLD (hyperlipidemia)  On Simvastatin Check lipid profile in the next visit      Multiple falls    Due to progressive MS leading to physical deconditioning Had PT recently Uses walker and scooter for ambulation      Weight loss    Denies fatigue, low-grade fever, night sweats Could be due to change in diet, recent episode of MS flare and physical deconditioning Will do age-appropriate screening      Depression, major, single episode, moderate (HCC)    Well-controlled On Zoloft       Other Visit Diagnoses    Screening mammogram for breast cancer    -  Primary   Relevant Orders   MM Digital Screening   Pap smear for cervical cancer screening       Relevant Orders   Ambulatory referral to Obstetrics / Gynecology      Outpatient Encounter Medications as of 08/09/2020  Medication Sig  . alendronate (FOSAMAX) 70 MG tablet Take 70 mg by mouth once a week. Take with a full glass of water on an empty stomach.  . gabapentin (NEURONTIN) 100 MG capsule Take 1 capsule (100 mg total) by mouth 3 (three) times daily. (Patient taking differently: Take 100 mg by mouth 4 (four) times daily.)  . levothyroxine (SYNTHROID) 50 MCG tablet Take 50 mcg by mouth daily before breakfast.   . potassium chloride (KLOR-CON) 8 MEQ tablet Take 8 mEq by mouth 3 (three) times a week.  . sertraline (ZOLOFT) 25 MG tablet Take 25 mg by mouth daily.  . simvastatin (ZOCOR) 20 MG tablet Take 20 mg by mouth daily.  . Siponimod Fumarate 0.25 MG TABS Take 1 mg by mouth  daily.  . SUMAtriptan (IMITREX) 100 MG tablet Take 1 tablet (100 mg total) by mouth every 2 (two) hours as needed for migraine. May repeat in 2 hours if headache persists or recurs.  . Vitamin D, Ergocalciferol, (DRISDOL) 1.25 MG (50000 UNIT) CAPS capsule TAKE 1 CAPSULE ONCE EVERY 7 DAYS ON SUNDAYS  . zonisamide (ZONEGRAN) 100 MG capsule Take 1 capsule (100 mg total) by mouth daily.  . [DISCONTINUED] diazepam (VALIUM) 5 MG tablet TAKE 1 TABLET BY MOUTH 40-60 MINUTES PRIOR TO MRI (Patient not taking: Reported on 08/09/2020)  . [DISCONTINUED] predniSONE (DELTASONE) 50 MG tablet 10 tablets daily for days 1 to 3, then 8 tablets on day 4, then 6 tablets on day 5, then 4 tablets on day 6, then 2 tablets on day 7, then 1 tablet on day 8, then 1/2 tablet on day 9, then STOP. (Patient not taking: No sig reported)  . [DISCONTINUED] Siponimod Fumarate (MAYZENT) 0.25 MG TABS Take 1 mg by mouth daily. (Patient not taking: Reported on 08/09/2020)  . [DISCONTINUED] UNABLE TO FIND Mayzent 0.25 tablets  Take PO QID (Patient not taking: Reported on 08/09/2020)   No facility-administered encounter medications on file as of 08/09/2020.    Follow-up: Return in about 3 months (around 11/06/2020) for Weight check and routine follow up.   Anabel Halon, MD

## 2020-08-09 NOTE — Assessment & Plan Note (Signed)
On Sumatriptan PRN On Zonisamide for ppx Follows up with Neurologist 

## 2020-08-09 NOTE — Assessment & Plan Note (Signed)
Denies fatigue, low-grade fever, night sweats Could be due to change in diet, recent episode of MS flare and physical deconditioning Will do age-appropriate screening

## 2020-08-09 NOTE — Assessment & Plan Note (Signed)
Well-controlled On Zoloft 

## 2020-08-09 NOTE — Assessment & Plan Note (Signed)
On Alendronate Vitamin D 50,000 IU once weekly 

## 2020-08-09 NOTE — Assessment & Plan Note (Signed)
On Mayzent 1 mg QD Progressive neurologic deficits Has paresthesia in the legs, physical deconditioning leading to wheelchair use Follows up with Neurologist

## 2020-08-09 NOTE — Patient Instructions (Signed)
Please continue taking medicines as prescribed.  Please check with your Neurologist for Neuro-ophthalmologist evaluation.  Please stay hydrated by taking at least 1.5 liters of fluid in a day. Avoid skipping any meals.  You are being scheduled for Mammography.  You are being referred to OB/GYN for PAP smear.

## 2020-08-09 NOTE — Assessment & Plan Note (Addendum)
Care established Previous chart reviewed History and medications reviewed with the patient 

## 2020-08-09 NOTE — Assessment & Plan Note (Signed)
On Levothyroxine 50 mcg QD Obtain records from previous PCP Check TSH in the next visit

## 2020-08-09 NOTE — Assessment & Plan Note (Signed)
On Simvastatin Check lipid profile in the next visit 

## 2020-08-15 ENCOUNTER — Other Ambulatory Visit: Payer: Self-pay | Admitting: Neurology

## 2020-08-23 ENCOUNTER — Ambulatory Visit (HOSPITAL_COMMUNITY): Payer: Commercial Indemnity

## 2020-08-28 ENCOUNTER — Other Ambulatory Visit: Payer: Self-pay

## 2020-08-28 ENCOUNTER — Ambulatory Visit (HOSPITAL_COMMUNITY)
Admission: RE | Admit: 2020-08-28 | Discharge: 2020-08-28 | Disposition: A | Discharge status: Home / Self Care | Payer: Medicare Other | Source: Ambulatory Visit | Attending: Internal Medicine | Admitting: Internal Medicine

## 2020-08-28 DIAGNOSIS — Z1231 Encounter for screening mammogram for malignant neoplasm of breast: Secondary | ICD-10-CM | POA: Diagnosis present

## 2020-08-28 IMAGING — MG MM DIGITAL SCREENING BILAT W/ TOMO AND CAD
6 of 10 series · 6 of 30 positions shown · non-contrast
Comparison: Previous exam(s).

CLINICAL DATA: Screening.

EXAM:
DIGITAL SCREENING BILATERAL MAMMOGRAM WITH TOMOSYNTHESIS AND CAD
TECHNIQUE: Bilateral screening digital craniocaudal and mediolateral oblique
mammograms were obtained. Bilateral screening digital breast
tomosynthesis was performed. The images were evaluated with
computer-aided detection.

[R XCCL synth-2D]
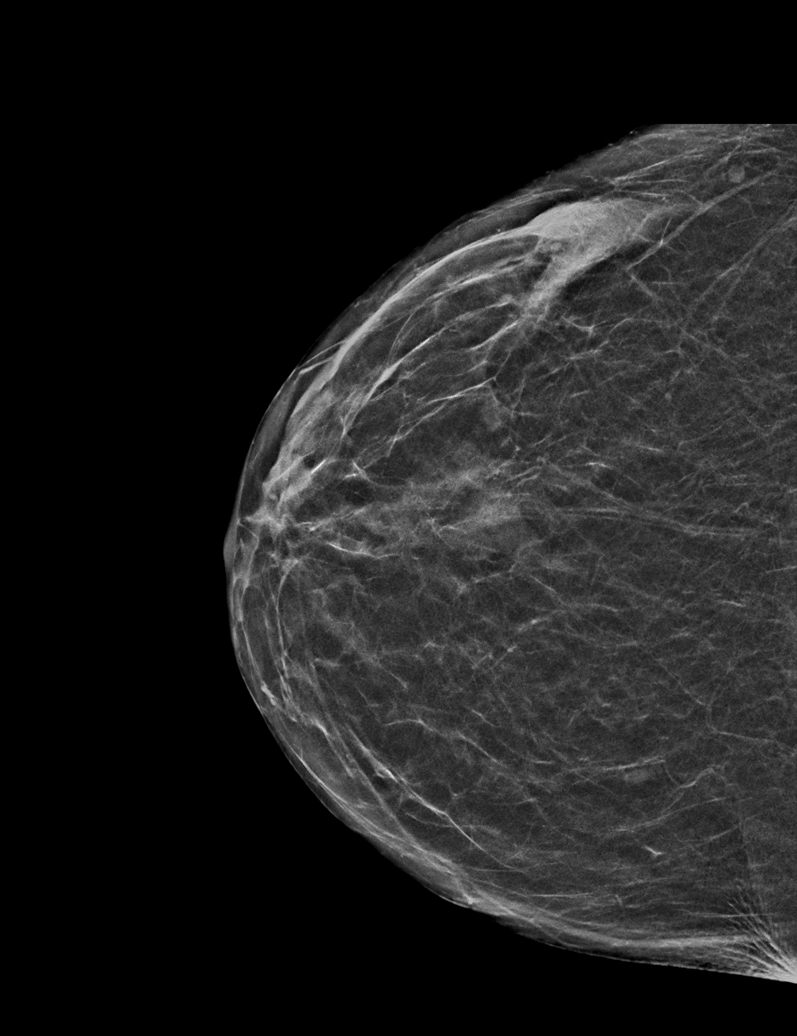

[L CC synth-2D]
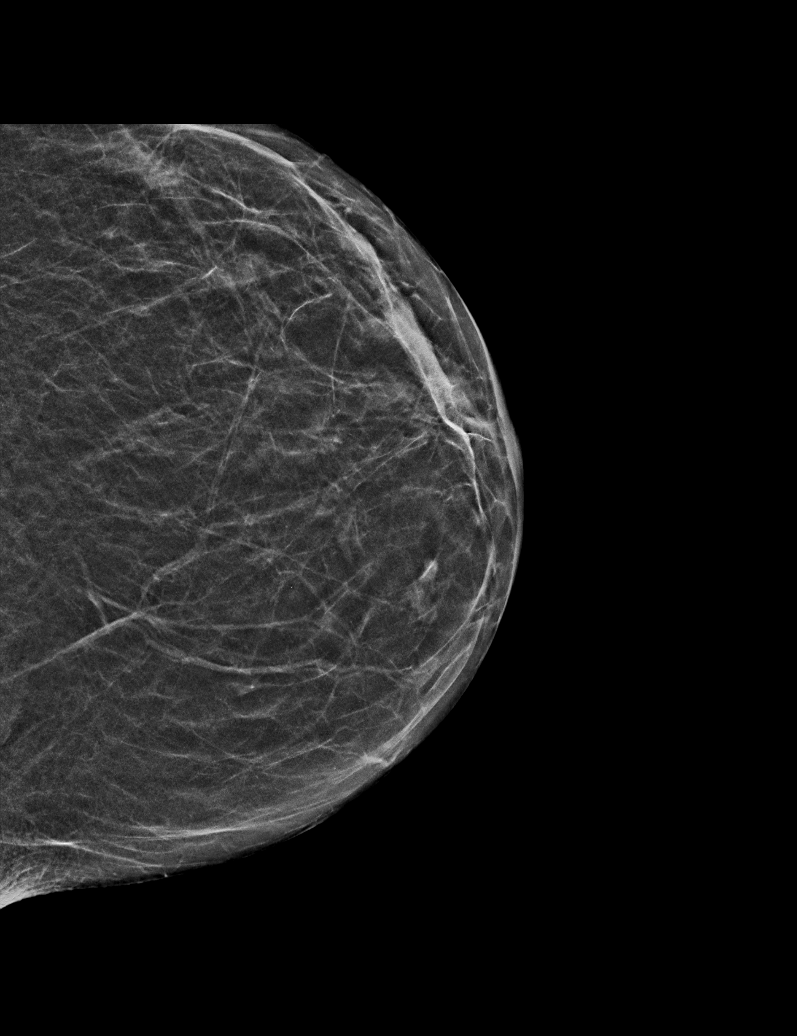

[R MLO synth-2D]
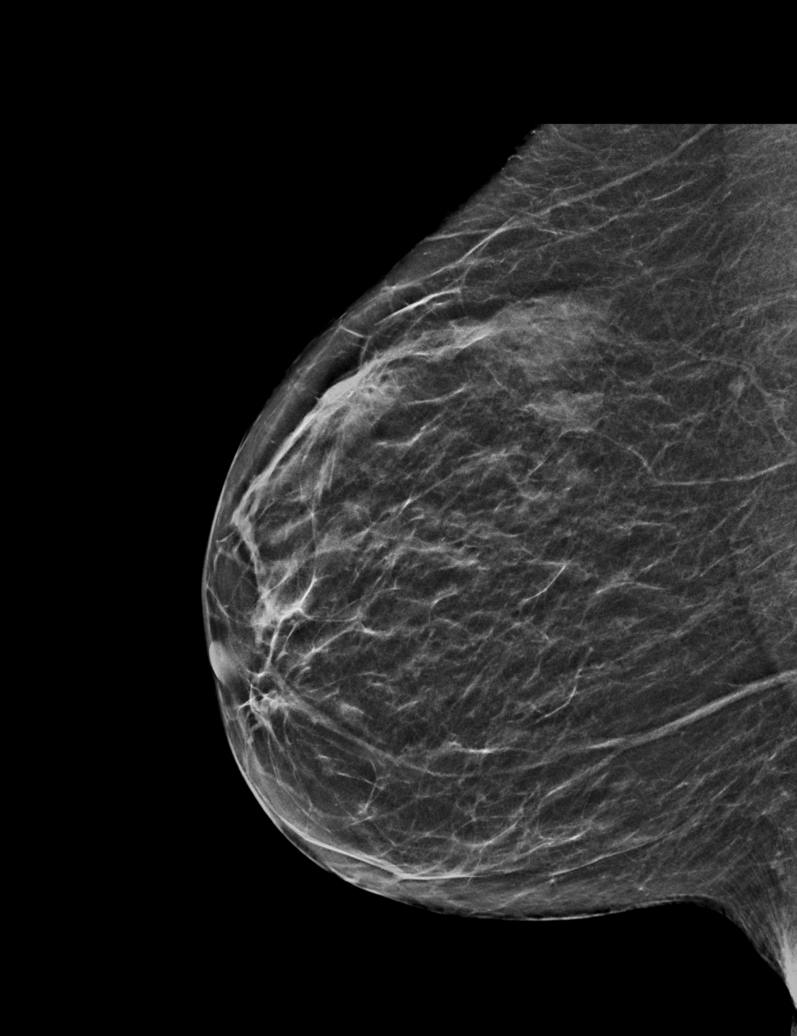

[L MLO synth-2D]
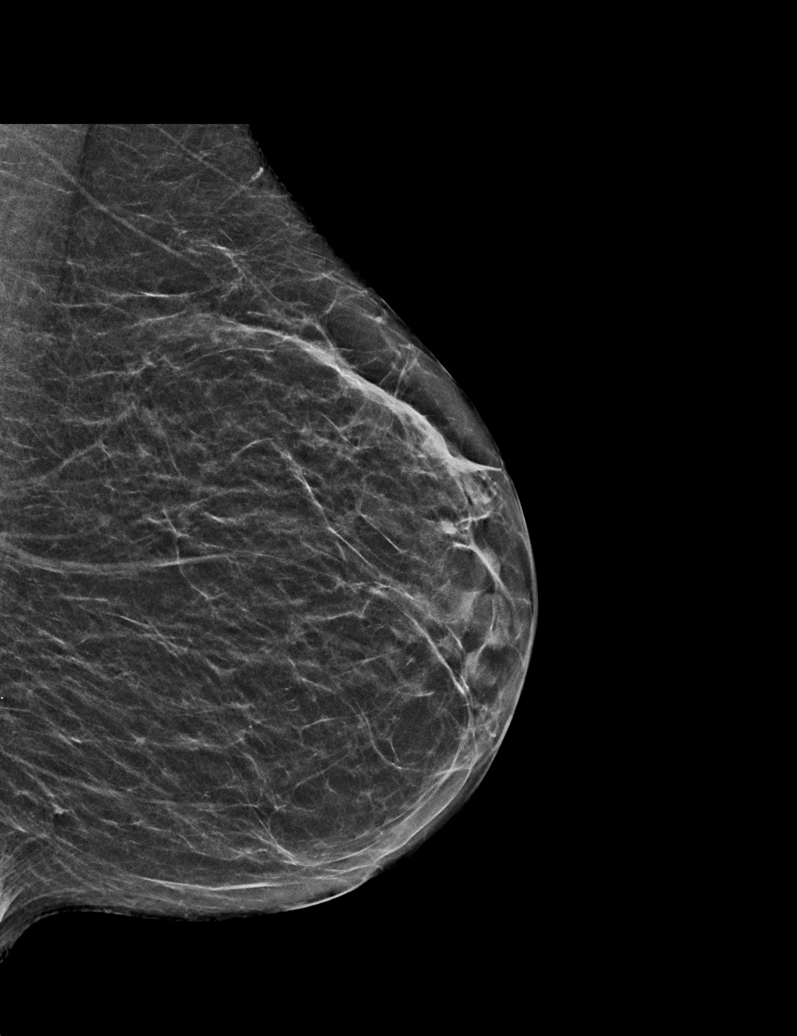

[R CC synth-2D]
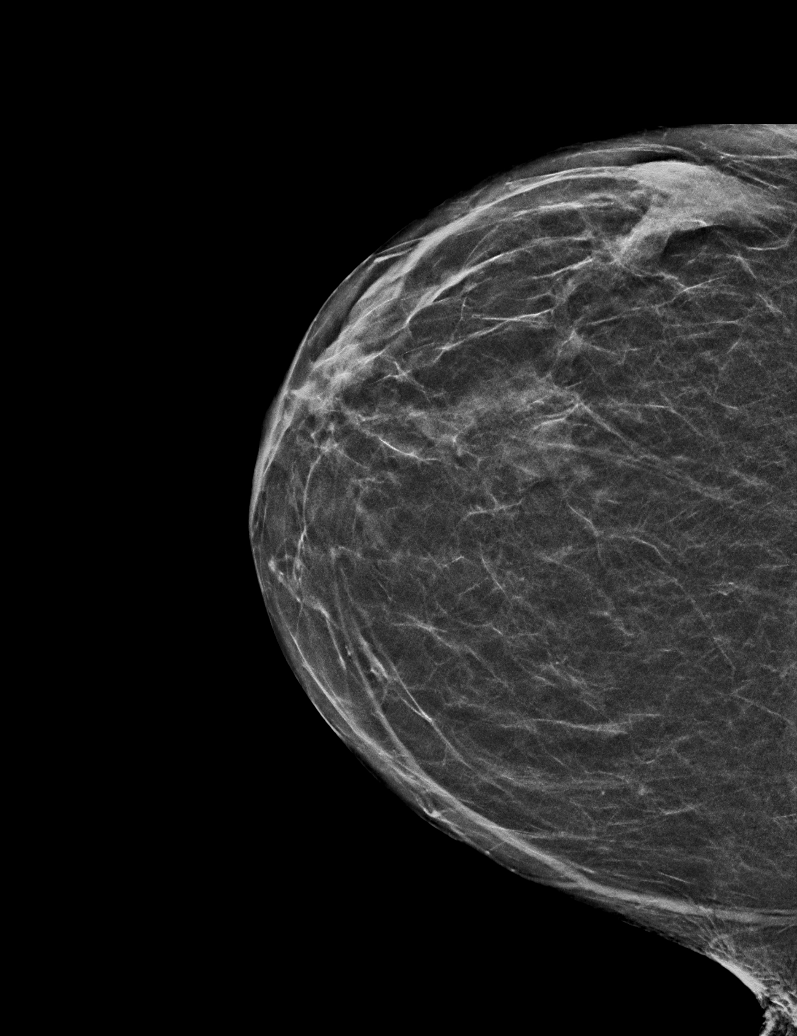

[L CC tomo · tomo slice 20/39.0]
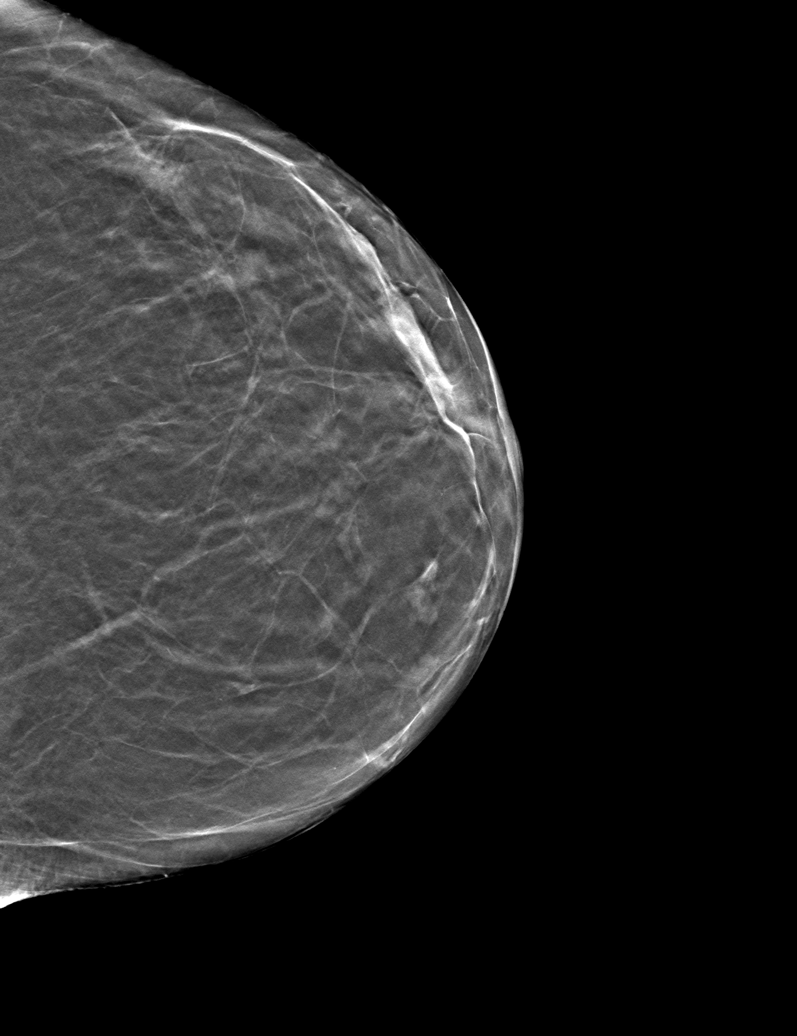

[6 of 30 positions shown; findings below may reference images not displayed]

ACR Breast Density Category b: There are scattered areas of
fibroglandular density.
FINDINGS: There are no findings suspicious for malignancy. The images were
evaluated with computer-aided detection.
IMPRESSION: No mammographic evidence of malignancy. A result letter of this
screening mammogram will be mailed directly to the patient.

RECOMMENDATION:
Screening mammogram in one year. (Code:[OD])

BI-RADS CATEGORY  1: Negative.

## 2020-08-30 ENCOUNTER — Other Ambulatory Visit: Payer: Self-pay | Admitting: Neurology

## 2020-09-02 ENCOUNTER — Other Ambulatory Visit: Payer: Self-pay | Admitting: Neurology

## 2020-09-02 ENCOUNTER — Telehealth: Payer: Self-pay | Admitting: Neurology

## 2020-09-02 MED ORDER — DIAZEPAM 5 MG PO TABS: ORAL_TABLET | ORAL | 0 refills | Status: DC

## 2020-09-02 NOTE — Telephone Encounter (Signed)
Script for two tablets of diazepam sent to [facility] in [place].  She needs driver to and from the MRI facility

## 2020-09-02 NOTE — Telephone Encounter (Signed)
Advised pt for this type of medication please call the office if she needs a script for valium.   Pt states she needs two script,She has an MRI scheduled for next week and another scheduled for two weeks from now.   Please advise.

## 2020-09-02 NOTE — Telephone Encounter (Signed)
LMOVM script sent to the pharmacy. Please have a driver. Take 30-60 minute prior to MRI

## 2020-09-03 ENCOUNTER — Telehealth: Payer: Self-pay | Admitting: Neurology

## 2020-09-03 NOTE — Telephone Encounter (Signed)
Patient called and said she needs her lab order sent to [facility] in [place]. Also, she wants to know if she needs to have the labs done before her MRI on 09/09/20?

## 2020-09-03 NOTE — Telephone Encounter (Signed)
Telephone call pt, pt to have her labs drawn a week prior to her f/u with DrJaffe. Look like pt doesn't have a f/u appt scheduled.  Pt transferred to front desk to schedule f/u appt.

## 2020-09-09 ENCOUNTER — Ambulatory Visit
Admission: RE | Admit: 2020-09-09 | Discharge: 2020-09-09 | Disposition: A | Discharge status: Home / Self Care | Payer: Medicare Other | Source: Ambulatory Visit | Attending: Neurology | Admitting: Neurology

## 2020-09-09 DIAGNOSIS — G35 Multiple sclerosis: Secondary | ICD-10-CM

## 2020-09-09 IMAGING — MR MR CERVICAL SPINE WO/W CM
6 of 9 series · 30 of 48 positions shown · IV contrast (13ml Multihance)
Comparison: [DATE]

CLINICAL DATA: Multiple sclerosis

EXAM:
MRI CERVICAL AND THORACIC SPINE WITHOUT AND WITH CONTRAST
TECHNIQUE: Multiplanar and multiecho pulse sequences of the cervical spine, to
include the craniocervical junction and cervicothoracic junction,
and the thoracic spine, were obtained without and with intravenous
contrast.
CONTRAST:  13mL MULTIHANCE GADOBENATE DIMEGLUMINE 529 MG/ML IV SOLN

[Series 3: T2 · sagittal · 3.0mm · 0.82mm/px · 5 of 18 slices shown (1 of 3)]
[im 1/18]
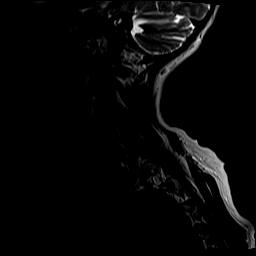
[im 5/18]
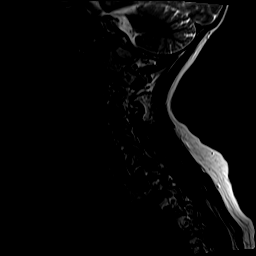
[im 9/18]
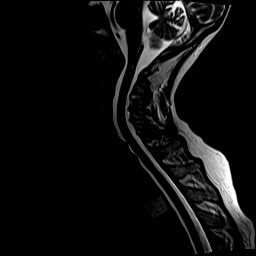
[im 13/18]
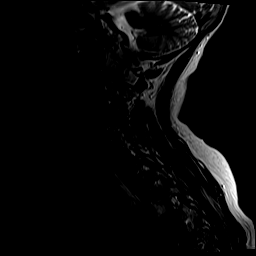
[im 18/18]
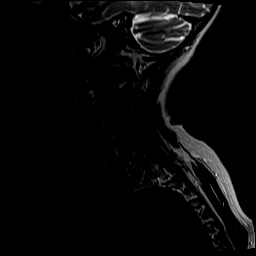

[Series 4: T1 · sagittal · 3.0mm · 0.41mm/px · 4 of 18 slices shown (1 of 3)]
[im 1/18]
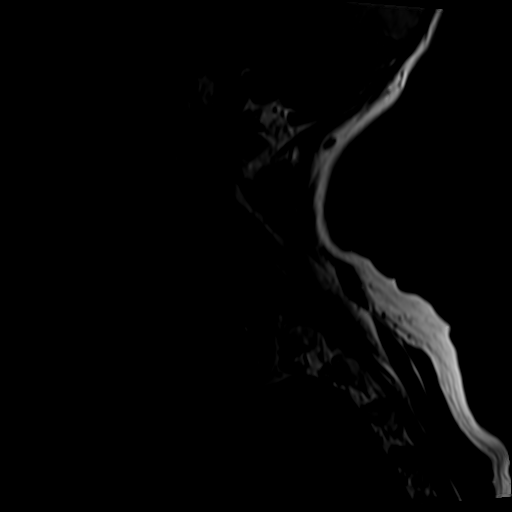
[im 6/18]
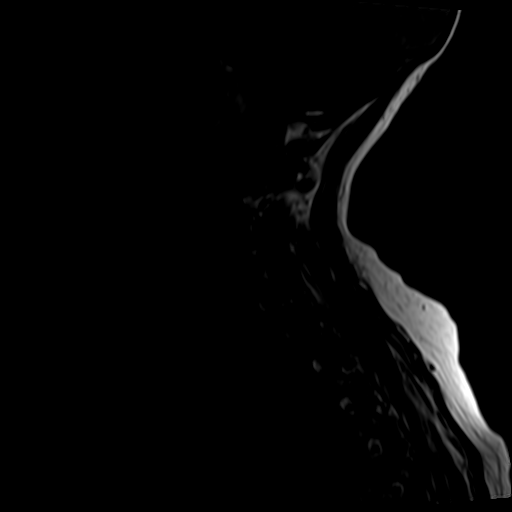
[im 12/18]
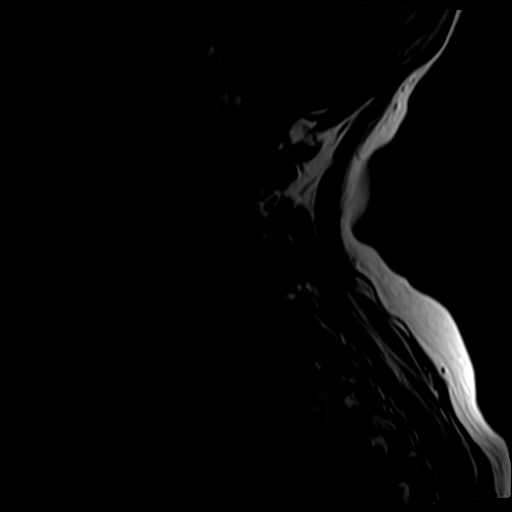
[im 18/18]
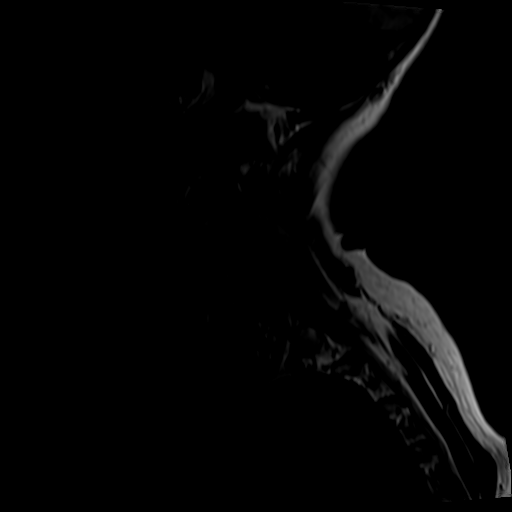

[Series 7: T2 · axial · 3.0mm · 0.70mm/px · z∈[-47,+66]mm · 7 of 33 slices shown (2 of 3)]
[im 1/33]
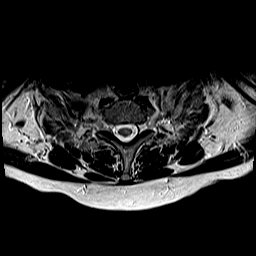
[im 6/33]
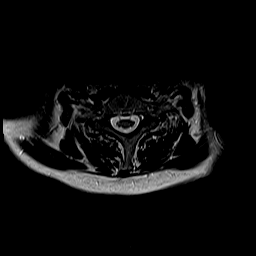
[im 11/33]
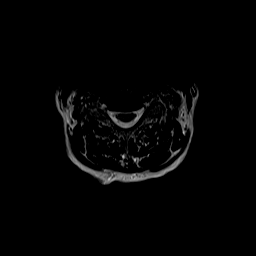
[im 17/33]
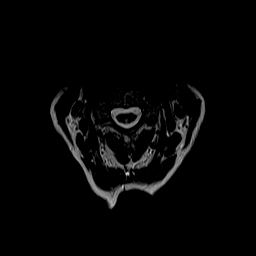
[im 22/33]
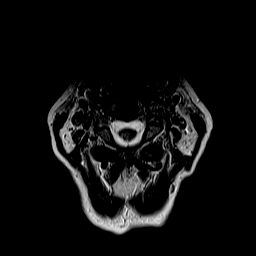
[im 27/33]
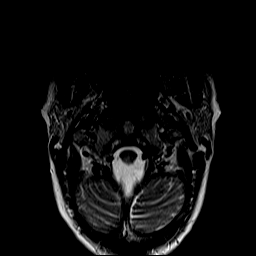
[im 33/33]
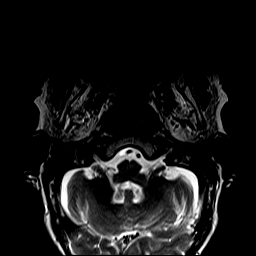

[Series 8: T1 · axial · 3.0mm · 0.35mm/px · z∈[-47,+66]mm · 7 of 32 slices shown (2 of 3)]
[im 1/32]
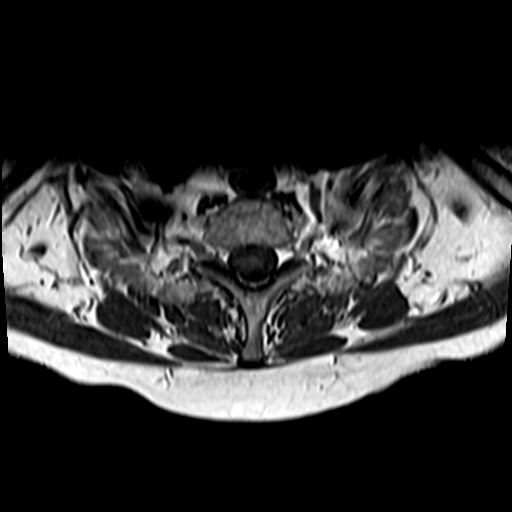
[im 6/32]
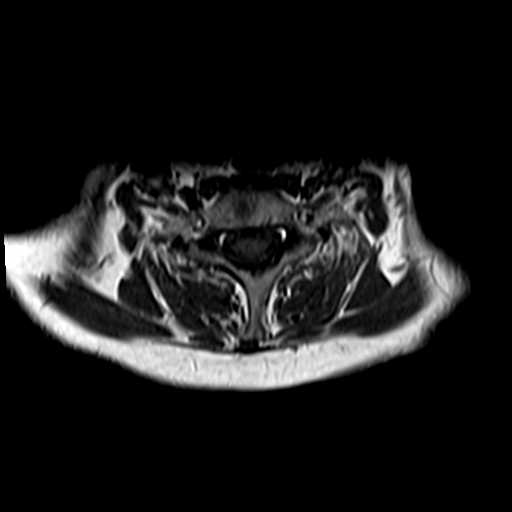
[im 11/32]
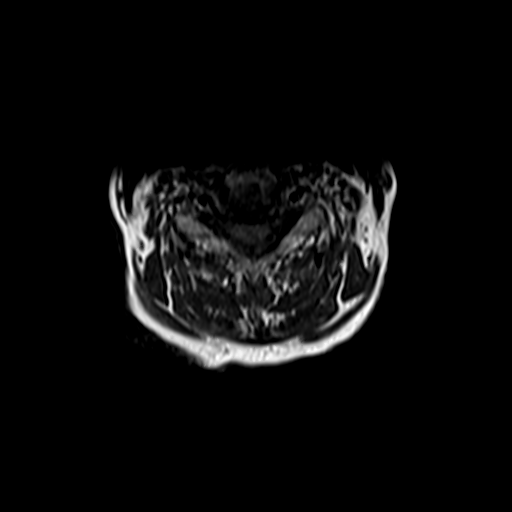
[im 16/32]
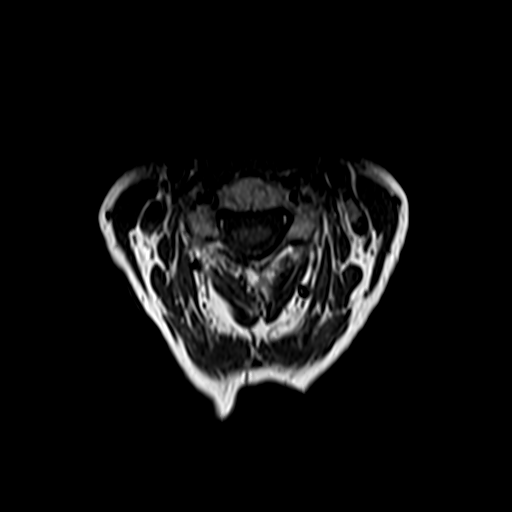
[im 21/32]
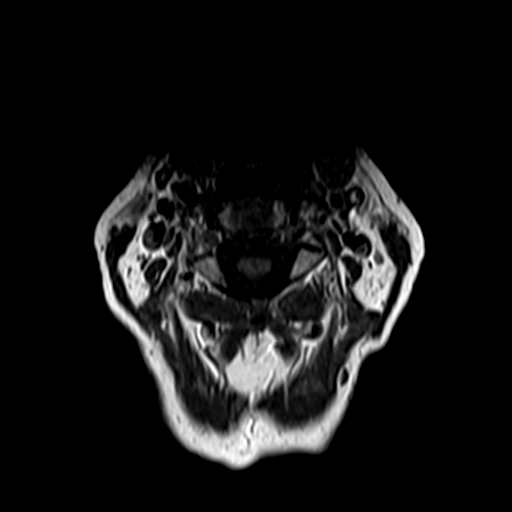
[im 26/32]
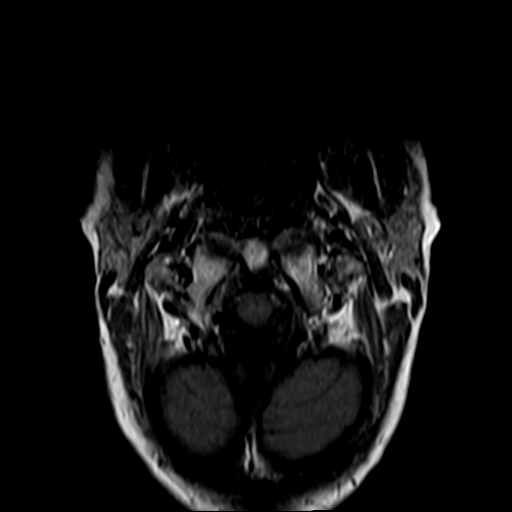
[im 32/32]
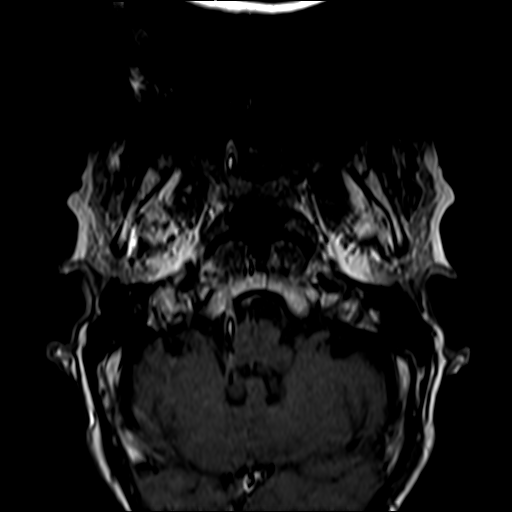

[Series 9: T1 · axial · 3.0mm · 0.35mm/px · z∈[-38,-3]mm · 3 of 28 slices shown (3 of 3)]
[im 1/28]
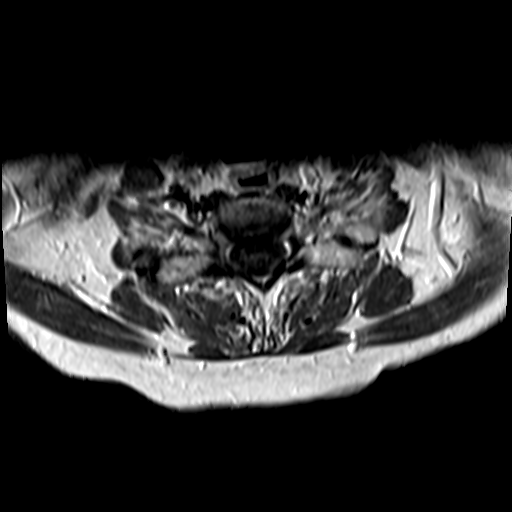
[im 6/28]
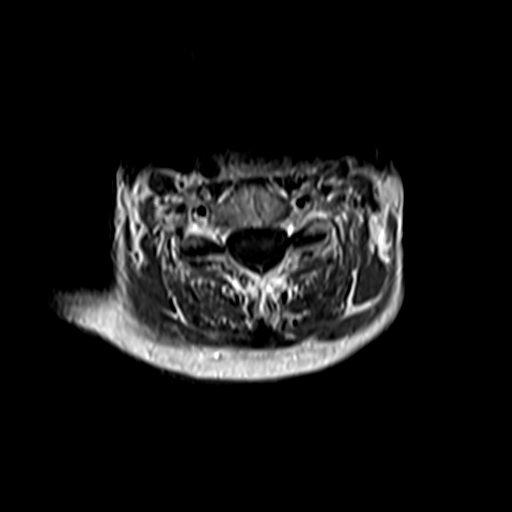
[im 11/28]
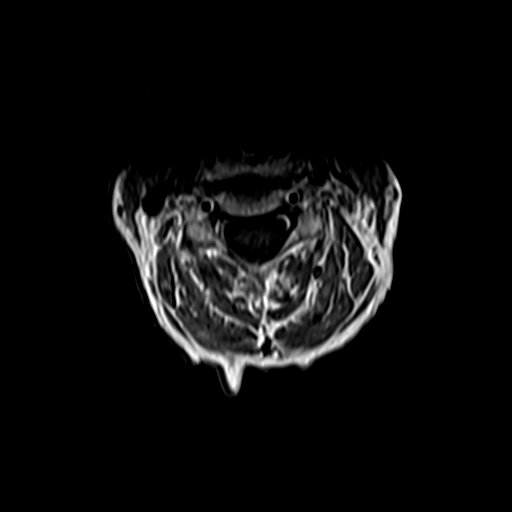

[Series 11: T2 · sagittal · 3.0mm · 0.82mm/px · 4 of 18 slices shown (3 of 3)]
[im 1/18]
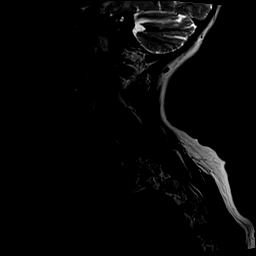
[im 6/18]
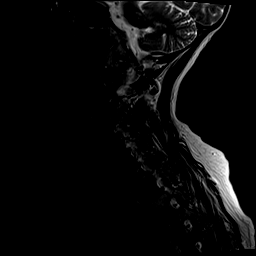
[im 12/18]
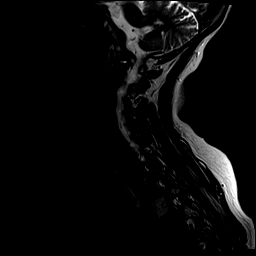
[im 18/18]
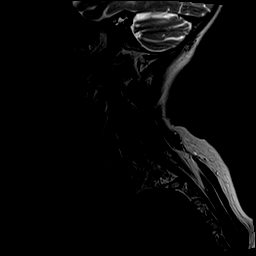

[30 of 48 positions shown; findings below may reference images not displayed]

FINDINGS: MRI CERVICAL SPINE FINDINGS

Alignment: Physiologic.

Vertebrae: No fracture, evidence of discitis, or bone lesion.

Cord: Unchanged distribution patchy hyperintense T2-weighted signal
lesions throughout the cervical spine, greatest at C2-4 and C7-T1.
There are no contrast-enhancing lesions.

Posterior Fossa, vertebral arteries, paraspinal tissues: Normal

Disc levels:

There is mild spondylosis with degenerative changes greatest at
C5-7. No spinal canal stenosis or neural impingement.

MRI THORACIC SPINE FINDINGS

Alignment:  Normal

Vertebrae: Unchanged height loss at T11. Areas of signal loss at T12
and L1 are unchanged.

Cord: The axial images are motion degraded, but there are no
contrast-enhancing lesions to indicate active demyelination.
Comparison to the prior study is otherwise limited.

Paraspinal and other soft tissues: Negative

Disc levels:

No spinal canal stenosis.
IMPRESSION: 1. Unchanged distribution of demyelinating lesions throughout the
cervical spinal cord. Motion degraded images of the thoracic spine,
but grossly unchanged.
2. No evidence of active demyelination within the cervical or
thoracic spine.

## 2020-09-09 IMAGING — MR MR THORACIC SPINE WO/W CM
6 of 9 series · 28 of 48 positions shown · IV contrast (multihance)
Comparison: [DATE]

CLINICAL DATA: Multiple sclerosis

EXAM:
MRI CERVICAL AND THORACIC SPINE WITHOUT AND WITH CONTRAST
TECHNIQUE: Multiplanar and multiecho pulse sequences of the cervical spine, to
include the craniocervical junction and cervicothoracic junction,
and the thoracic spine, were obtained without and with intravenous
contrast.
CONTRAST:  13mL MULTIHANCE GADOBENATE DIMEGLUMINE 529 MG/ML IV SOLN

[Series 2: T2 · coronal · 5.0mm · 1.56mm/px · 4 of 24 slices shown (1 of 2)]
[im 1/24]
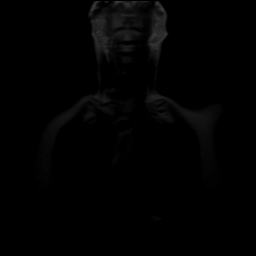
[im 8/24]
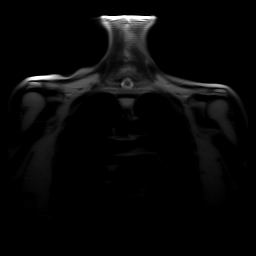
[im 16/24]
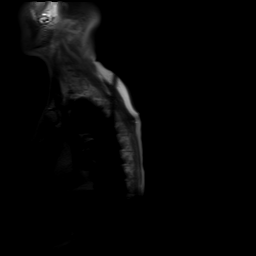
[im 24/24]
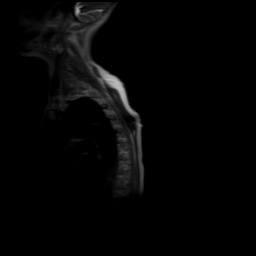

[Series 5: T1 · sagittal · 3.0mm · 0.68mm/px · 3 of 19 slices shown (1 of 2)]
[im 1/19]
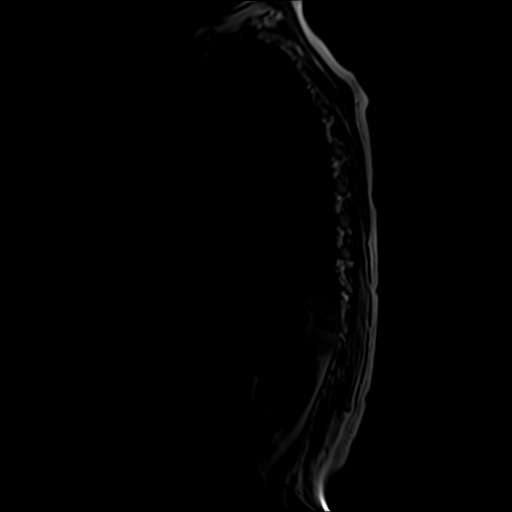
[im 10/19]
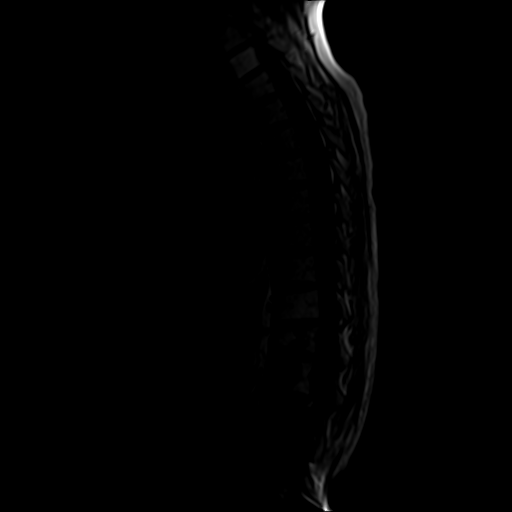
[im 19/19]
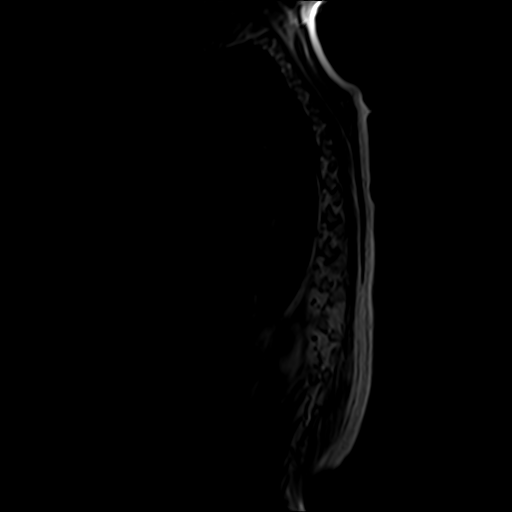

[Series 6: T2 · axial · 4.0mm · 0.39mm/px · z∈[-318,+2]mm · 8 of 56 slices shown (2 of 2)]
[im 1/56]
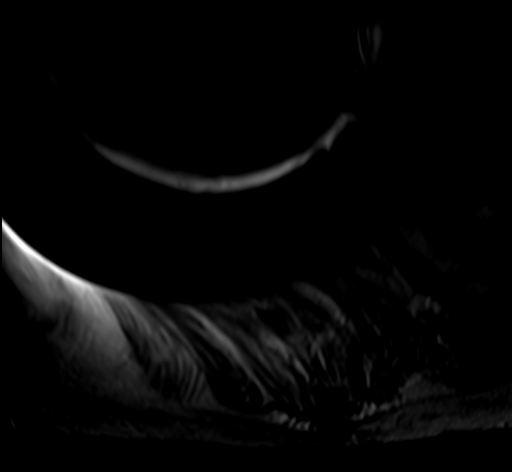
[im 8/56]
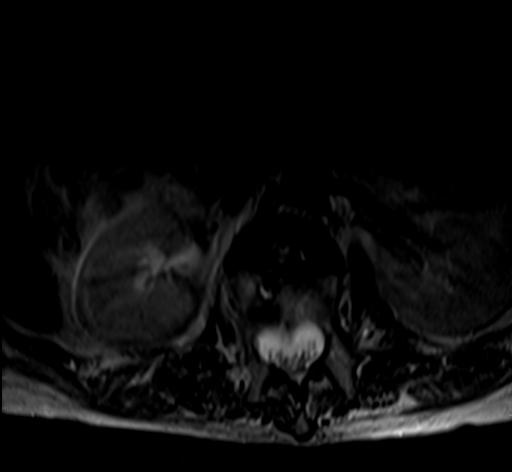
[im 16/56]
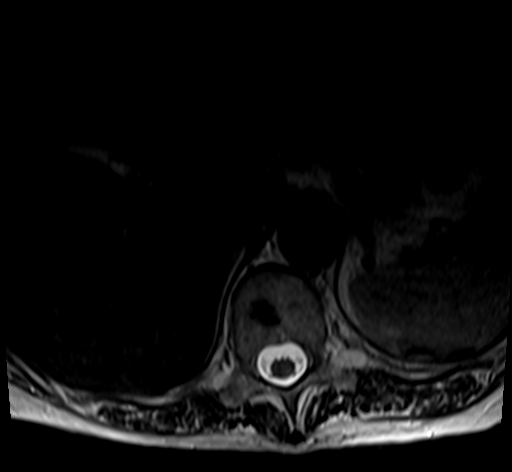
[im 24/56]
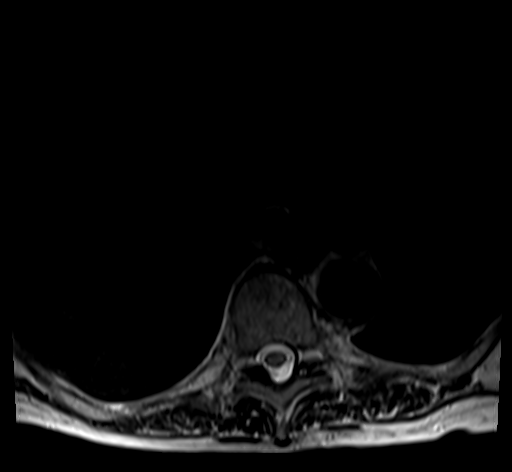
[im 32/56]
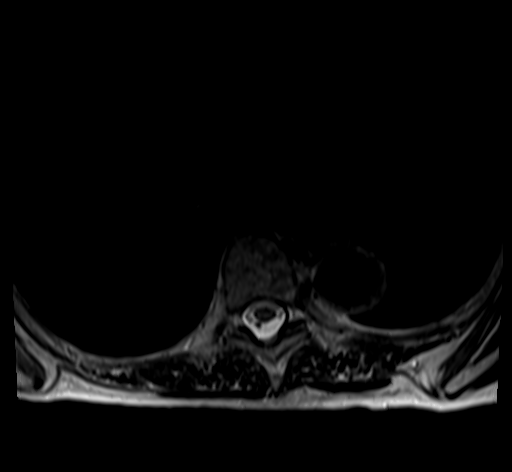
[im 40/56]
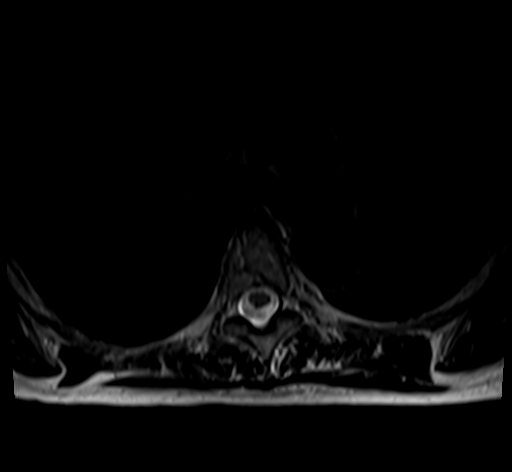
[im 48/56]
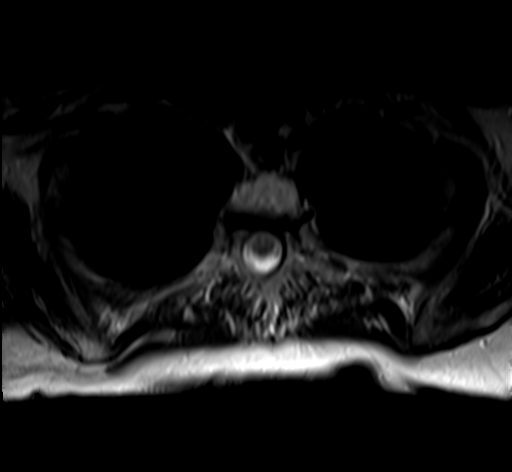
[im 56/56]
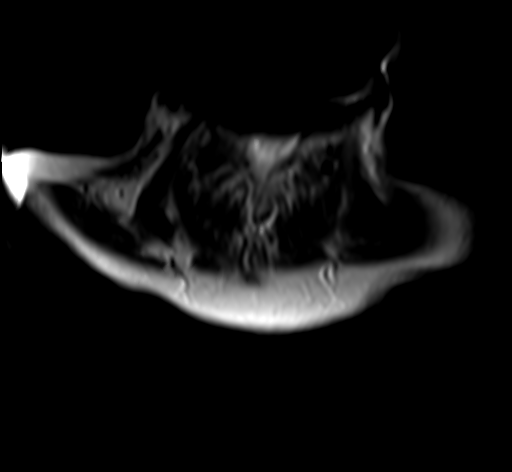

[Series 8: T1 · axial · 4.0mm · 0.78mm/px · z∈[-318,+2]mm · 8 of 56 slices shown (2 of 2)]
[im 1/56]
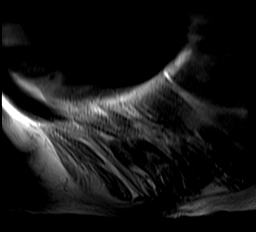
[im 8/56]
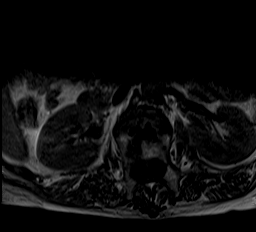
[im 16/56]
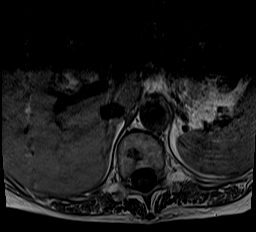
[im 24/56]
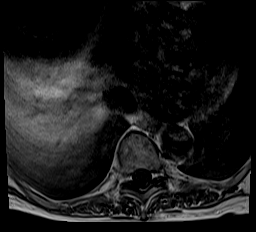
[im 32/56]
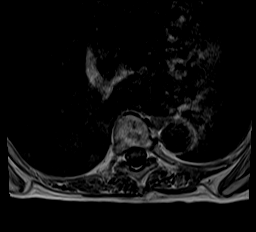
[im 40/56]
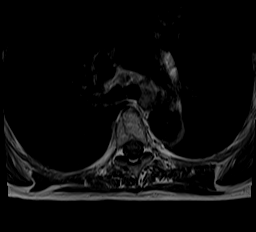
[im 48/56]
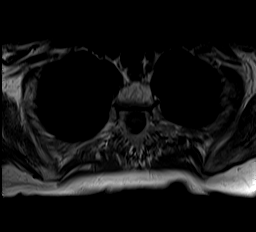
[im 56/56]
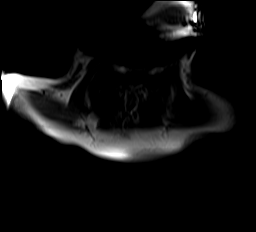

[Series 9: T1 fat-sat post-contrast · sagittal · 3.0mm · 0.68mm/px · 2 of 19 slices shown]
[im 1/19]
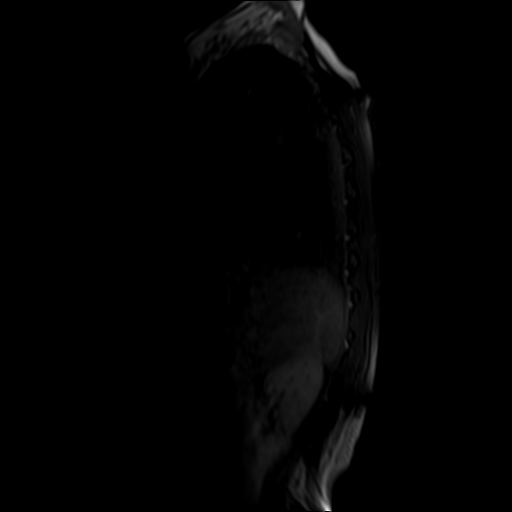
[im 10/19]
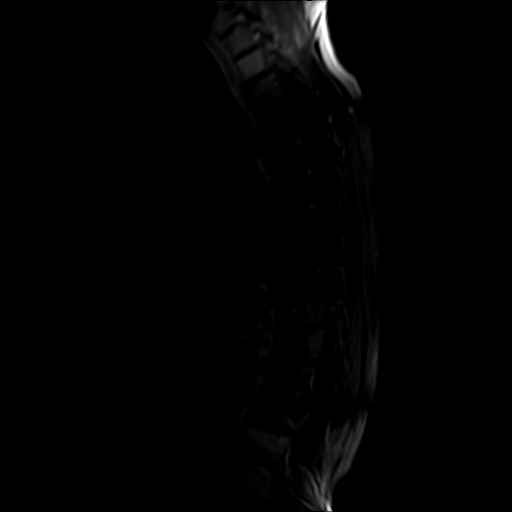

[Series 10: T2 post-contrast · sagittal · 3.0mm · 0.68mm/px · 3 of 19 slices shown]
[im 1/19]
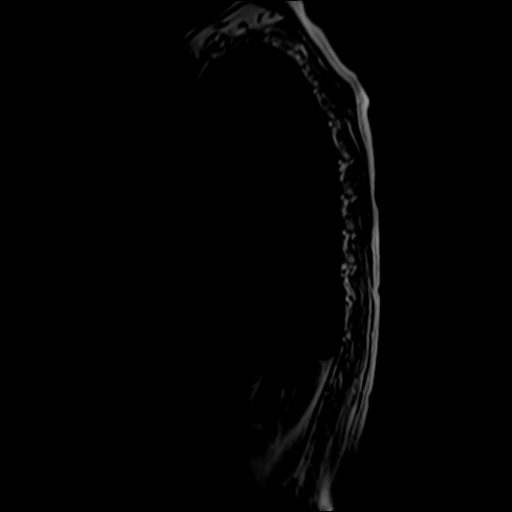
[im 10/19]
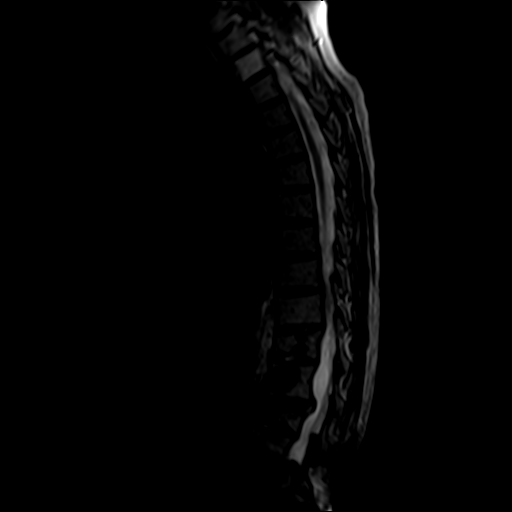
[im 19/19]
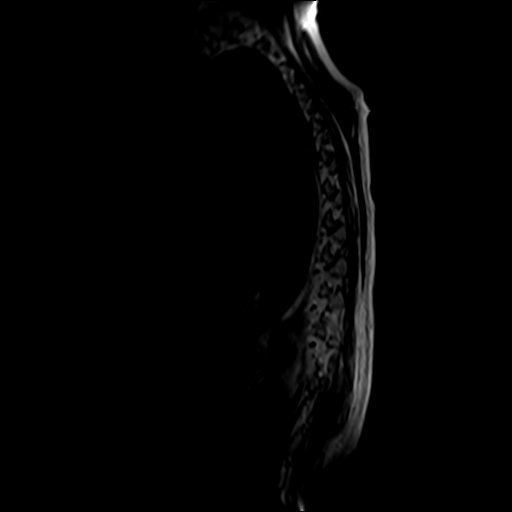

[28 of 48 positions shown; findings below may reference images not displayed]

FINDINGS: MRI CERVICAL SPINE FINDINGS

Alignment: Physiologic.

Vertebrae: No fracture, evidence of discitis, or bone lesion.

Cord: Unchanged distribution patchy hyperintense T2-weighted signal
lesions throughout the cervical spine, greatest at C2-4 and C7-T1.
There are no contrast-enhancing lesions.

Posterior Fossa, vertebral arteries, paraspinal tissues: Normal

Disc levels:

There is mild spondylosis with degenerative changes greatest at
C5-7. No spinal canal stenosis or neural impingement.

MRI THORACIC SPINE FINDINGS

Alignment:  Normal

Vertebrae: Unchanged height loss at T11. Areas of signal loss at T12
and L1 are unchanged.

Cord: The axial images are motion degraded, but there are no
contrast-enhancing lesions to indicate active demyelination.
Comparison to the prior study is otherwise limited.

Paraspinal and other soft tissues: Negative

Disc levels:

No spinal canal stenosis.
IMPRESSION: 1. Unchanged distribution of demyelinating lesions throughout the
cervical spinal cord. Motion degraded images of the thoracic spine,
but grossly unchanged.
2. No evidence of active demyelination within the cervical or
thoracic spine.

## 2020-09-09 MED ORDER — GADOBENATE DIMEGLUMINE 529 MG/ML IV SOLN
13.0000 mL | Freq: Once | INTRAVENOUS | Status: AC | PRN
  Administered 2020-09-09: 13 mL via INTRAVENOUS

## 2020-09-10 ENCOUNTER — Inpatient Hospital Stay
Admission: RE | Admit: 2020-09-10 | Discharge: 2020-09-10 | Disposition: A | Discharge status: Home / Self Care | Payer: Self-pay | Source: Ambulatory Visit | Attending: Internal Medicine | Admitting: Internal Medicine

## 2020-09-10 ENCOUNTER — Other Ambulatory Visit: Payer: Self-pay | Admitting: Internal Medicine

## 2020-09-10 DIAGNOSIS — Z1231 Encounter for screening mammogram for malignant neoplasm of breast: Secondary | ICD-10-CM

## 2020-09-10 NOTE — Progress Notes (Signed)
Pt advised of her MRI results.

## 2020-09-16 ENCOUNTER — Other Ambulatory Visit: Payer: Self-pay

## 2020-09-16 ENCOUNTER — Ambulatory Visit
Admission: RE | Admit: 2020-09-16 | Discharge: 2020-09-16 | Disposition: A | Discharge status: Home / Self Care | Payer: Medicare Other | Source: Ambulatory Visit | Attending: Neurology | Admitting: Neurology

## 2020-09-16 DIAGNOSIS — G35 Multiple sclerosis: Secondary | ICD-10-CM

## 2020-09-16 IMAGING — MR MR HEAD WO/W CM
13 series · 48 of 48 positions shown · IV contrast (multihance)
Comparison: [DATE]

CLINICAL DATA: Follow-up multiple sclerosis

EXAM:
MRI HEAD WITHOUT AND WITH CONTRAST
TECHNIQUE: Multiplanar, multiecho pulse sequences of the brain and surrounding
structures were obtained without and with intravenous contrast.
CONTRAST:  11mL MULTIHANCE GADOBENATE DIMEGLUMINE 529 MG/ML IV SOLN

[Series 2: t1_se_sag · sagittal · 5.0mm · 0.45mm/px · 1 of 21 slices shown]
[im 1/21]
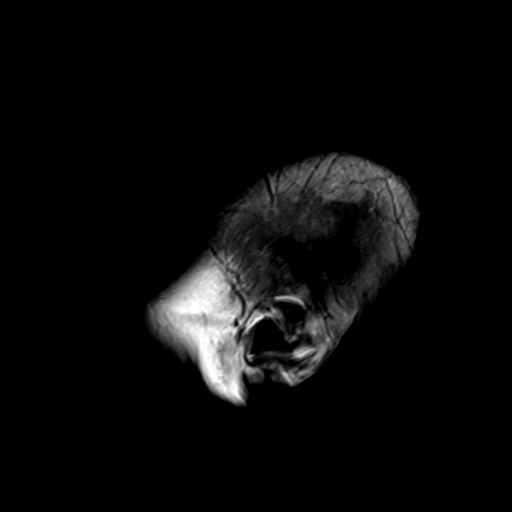

[Series 3: ep2d_diff_3 · axial · 3.0mm · 1.80mm/px · z∈[-36,+113]mm · 6 of 100 slices shown]
[im 1/100]
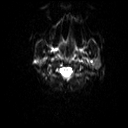
[im 20/100]
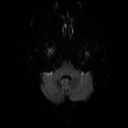
[im 40/100]
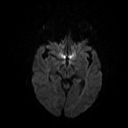
[im 60/100]
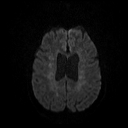
[im 80/100]
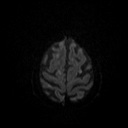
[im 100/100]
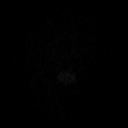

[Series 4: ep2d_diff_3_adc · axial · 3.0mm · 1.80mm/px · z∈[-36,+113]mm · 3 of 48 slices shown]
[im 1/48]
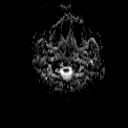
[im 24/48]
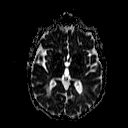
[im 48/48]
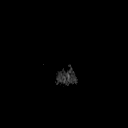

[Series 5: ep2d_diff_cor · coronal · 5.0mm · 1.77mm/px · 3 of 55 slices shown]
[im 1/55]
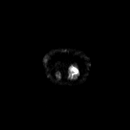
[im 28/55]
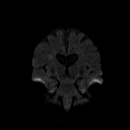
[im 55/55]
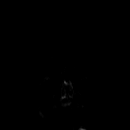

[Series 6: ep2d_diff_cor_adc · coronal · 5.0mm · 1.77mm/px · 2 of 28 slices shown]
[im 1/28]
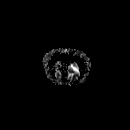
[im 28/28]
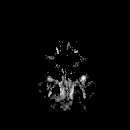

[Series 8: swi_images · axial · 2.0mm · 0.90mm/px · z∈[-39,+117]mm · 5 of 80 slices shown]
[im 1/80]
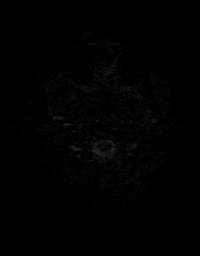
[im 20/80]
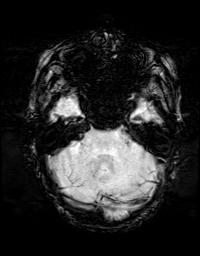
[im 40/80]
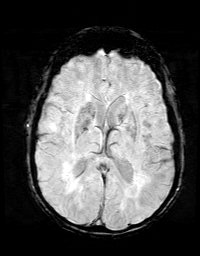
[im 60/80]
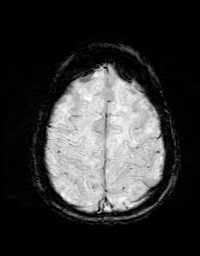
[im 80/80]
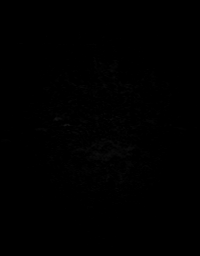

[Series 9: FLAIR · axial · 3.0mm · 0.43mm/px · z∈[-12,+88]mm · 2 of 27 slices shown (1 of 2)]
[im 1/27]
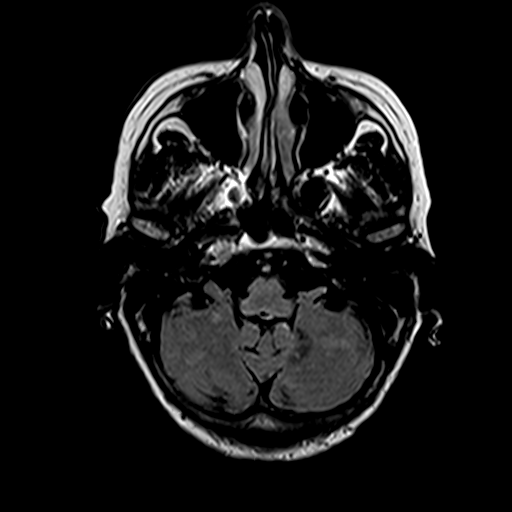
[im 27/27]
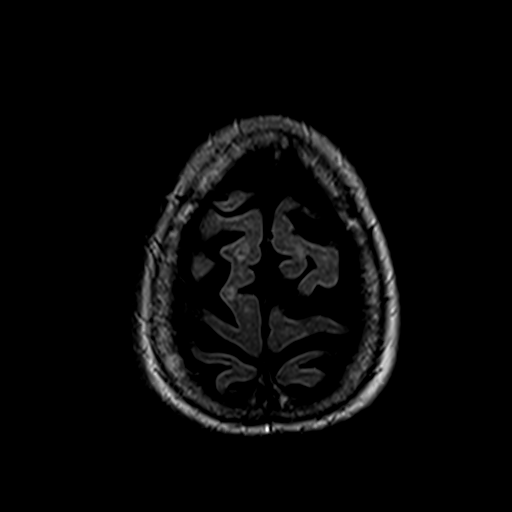

[Series 10: t2_tse_tra_512 · axial · 5.0mm · 0.60mm/px · z∈[-35,+113]mm · 2 of 26 slices shown]
[im 1/26]
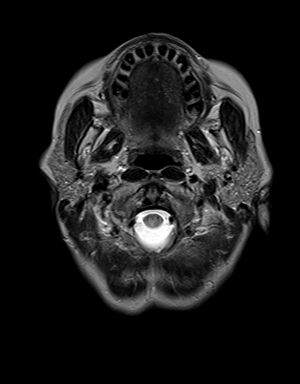
[im 26/26]
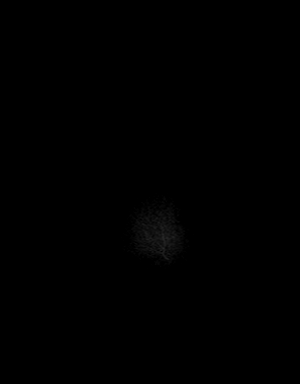

[Series 11: t1_mpr_tra · axial · 1.0mm · 0.72mm/px · z∈[-32,+110]mm · 9 of 144 slices shown]
[im 1/144]
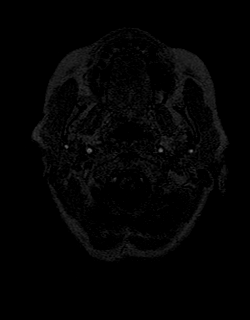
[im 18/144]
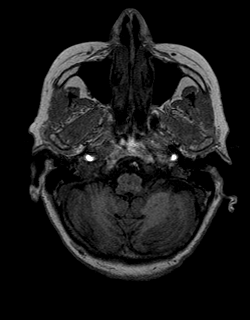
[im 36/144]
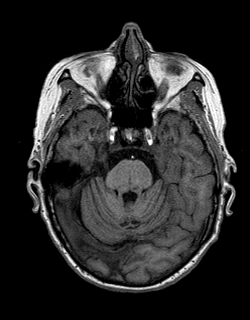
[im 54/144]
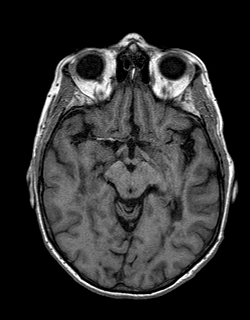
[im 72/144]
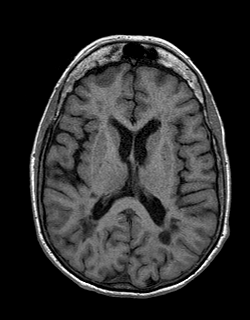
[im 90/144]
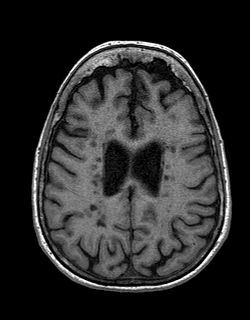
[im 108/144]
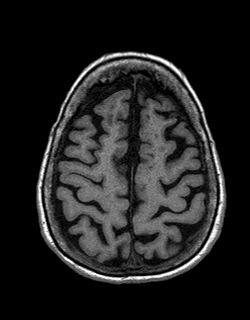
[im 126/144]
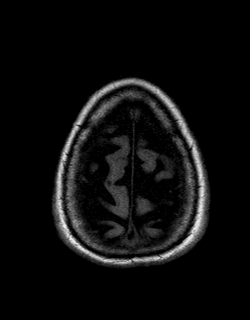
[im 144/144]
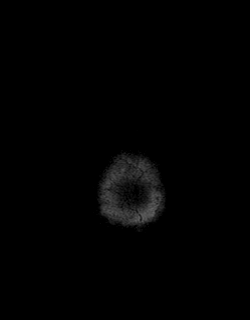

[Series 12: FLAIR · sagittal · 5.0mm · 0.45mm/px · 2 of 27 slices shown (2 of 2)]
[im 1/27]
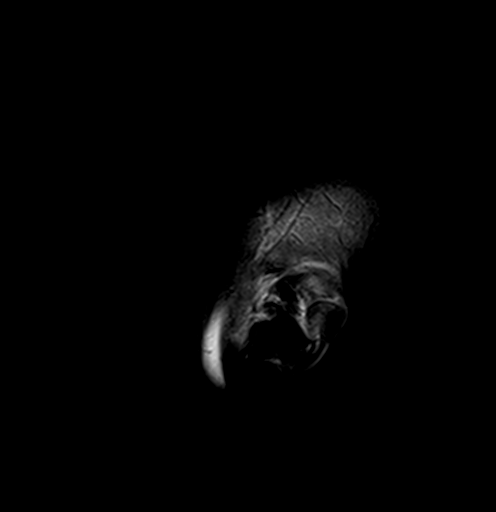
[im 27/27]
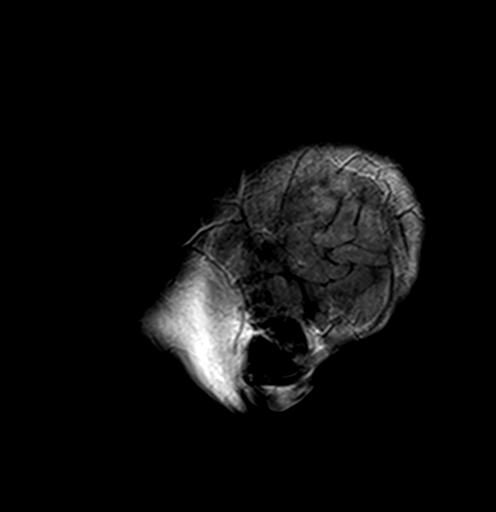

[Series 13: T2 · coronal · 5.0mm · 0.45mm/px · 2 of 28 slices shown]
[im 1/28]
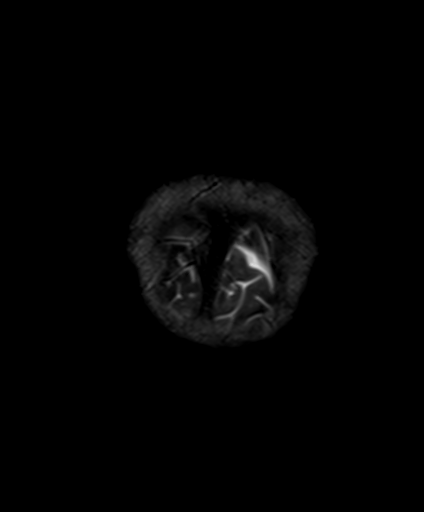
[im 28/28]
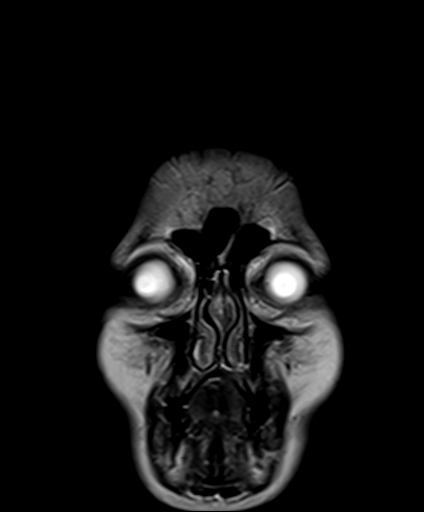

[Series 14: T1 post-contrast · coronal · 5.0mm · 0.72mm/px · 2 of 28 slices shown]
[im 1/28]
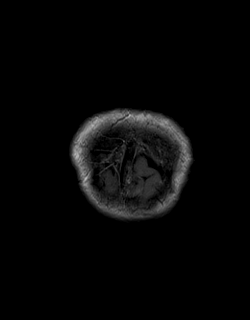
[im 28/28]
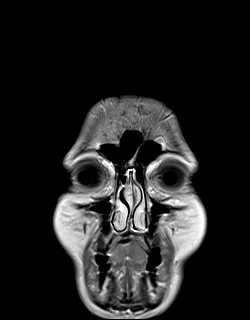

[Series 15: post t1_mpr_tra · axial · 1.0mm · 0.72mm/px · z∈[-32,+110]mm · 9 of 144 slices shown]
[im 1/144]
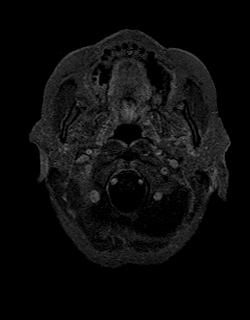
[im 18/144]
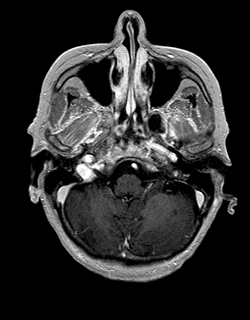
[im 36/144]
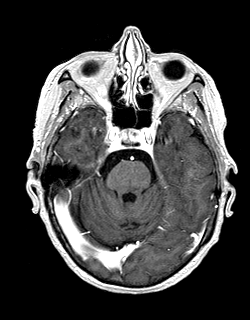
[im 54/144]
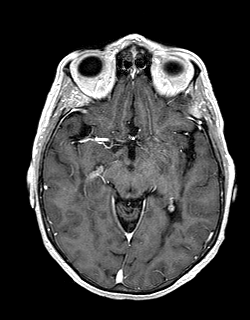
[im 72/144]
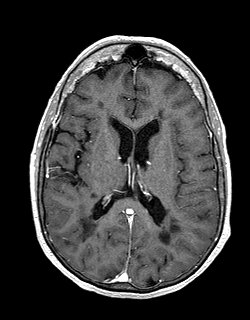
[im 90/144]
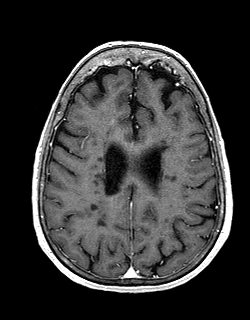
[im 108/144]
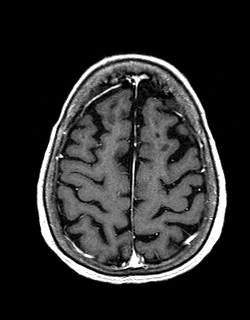
[im 126/144]
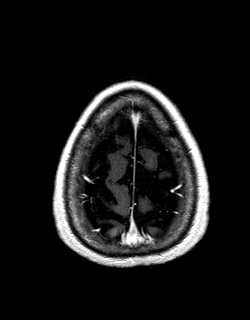
[im 144/144]
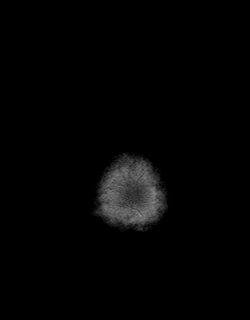

[48 of 48 positions shown; findings below may reference images not displayed]

FINDINGS: Brain: No progressive change is appreciated when compared to the
study [DATE]. Widespread changes of multiple sclerosis
affecting the brainstem, cerebellum and cerebral hemispheres. Many
of the demyelinating lesions show T2 shine through but there is no
true restricted diffusion. No lesions show abnormal enhancement
today. On last year's study, there was abnormal enhancement in a
left temporal lobe plaque and a left medullary plaque, but that has
resolved. Those foci also show slight contraction of T2 signal.

Otherwise, the findings are stable. Central volume loss with mild
ventricular prominence but no obstructive hydrocephalus. No
extra-axial collection.

Vascular: Major vessels at the base of the brain show flow.

Skull and upper cervical spine: Negative

Sinuses/Orbits: Clear/normal

Other: None
IMPRESSION: Widespread chronic changes of multiple sclerosis affecting the
brainstem, cerebellum and cerebral hemispheres. No new or
progressive lesions since [DATE]. Previously seen enhancement
in a left temporal lobe plaque and left medullary plaque has
resolved and the plaques in those locations show a slightly
contracted, inactive appearance.

## 2020-09-16 MED ORDER — GADOBENATE DIMEGLUMINE 529 MG/ML IV SOLN
11.0000 mL | Freq: Once | INTRAVENOUS | Status: AC | PRN
  Administered 2020-09-16: 11 mL via INTRAVENOUS

## 2020-09-17 ENCOUNTER — Telehealth: Payer: Self-pay

## 2020-09-17 NOTE — Telephone Encounter (Signed)
-----   Message from Drema Dallas, DO sent at 09/17/2020  6:37 AM EDT ----- MRI of brain looks stable - no new, progressed or active MS

## 2020-09-17 NOTE — Telephone Encounter (Signed)
Called patient and informed her of MRI results. Patient verbalized understanding and had no questions or concerns. 

## 2020-10-07 ENCOUNTER — Other Ambulatory Visit: Payer: Self-pay | Admitting: Internal Medicine

## 2020-10-07 ENCOUNTER — Other Ambulatory Visit: Payer: Self-pay | Admitting: *Deleted

## 2020-10-07 ENCOUNTER — Telehealth: Payer: Self-pay

## 2020-10-07 MED ORDER — SIMVASTATIN 20 MG PO TABS: 20.0000 mg | ORAL_TABLET | Freq: Every day | ORAL | 0 refills | Status: DC

## 2020-10-07 NOTE — Telephone Encounter (Signed)
Patient called need med refill   simvastatin (ZOCOR) 20 MG tablet   Pharmacy: [facility] [address] [phone]

## 2020-10-07 NOTE — Telephone Encounter (Signed)
Medication sent to pt pharmacy 

## 2020-10-14 ENCOUNTER — Telehealth: Payer: Self-pay | Admitting: Neurology

## 2020-10-14 ENCOUNTER — Other Ambulatory Visit: Payer: Commercial Indemnity | Admitting: Adult Health

## 2020-10-14 DIAGNOSIS — G35 Multiple sclerosis: Secondary | ICD-10-CM

## 2020-10-14 NOTE — Telephone Encounter (Signed)
Patient wants to know if she can wait to have her labs done at her next appointment or if she needs to have them before the visit?  If needed before the referral, orders need sent to a place in [place], per patient.

## 2020-10-15 NOTE — Telephone Encounter (Signed)
CBC with Diff and CMP released and faxed to [facility] on [facility] in [place]

## 2020-10-24 LAB — COMPREHENSIVE METABOLIC PANEL
ALT: 24 IU/L (ref 0–32)
AST: 28 IU/L (ref 0–40)
Albumin/Globulin Ratio: 2.3 — ABNORMAL HIGH (ref 1.2–2.2)
Albumin: 4.5 g/dL (ref 3.8–4.8)
Alkaline Phosphatase: 98 IU/L (ref 44–121)
BUN/Creatinine Ratio: 35 — ABNORMAL HIGH (ref 12–28)
BUN: 25 mg/dL (ref 8–27)
Bilirubin Total: 0.4 mg/dL (ref 0.0–1.2)
CO2: 23 mmol/L (ref 20–29)
Calcium: 9.1 mg/dL (ref 8.7–10.3)
Chloride: 108 mmol/L — ABNORMAL HIGH (ref 96–106)
Creatinine, Ser: 0.71 mg/dL (ref 0.57–1.00)
Globulin, Total: 2 g/dL (ref 1.5–4.5)
Glucose: 95 mg/dL (ref 65–99)
Potassium: 4 mmol/L (ref 3.5–5.2)
Sodium: 147 mmol/L — ABNORMAL HIGH (ref 134–144)
Total Protein: 6.5 g/dL (ref 6.0–8.5)
eGFR: 95 mL/min/{1.73_m2} (ref >59)

## 2020-10-24 LAB — CBC WITH DIFFERENTIAL/PLATELET
Basophils Absolute: 0 10*3/uL (ref 0.0–0.2)
Basos: 0 %
EOS (ABSOLUTE): 0 10*3/uL (ref 0.0–0.4)
Eos: 1 %
Hematocrit: 41.6 % (ref 34.0–46.6)
Hemoglobin: 13.8 g/dL (ref 11.1–15.9)
Immature Grans (Abs): 0 10*3/uL (ref 0.0–0.1)
Immature Granulocytes: 0 %
Lymphocytes Absolute: 0.3 10*3/uL — ABNORMAL LOW (ref 0.7–3.1)
Lymphs: 7 %
MCH: 30.8 pg (ref 26.6–33.0)
MCHC: 33.2 g/dL (ref 31.5–35.7)
MCV: 93 fL (ref 79–97)
Monocytes Absolute: 0.6 10*3/uL (ref 0.1–0.9)
Monocytes: 12 %
Neutrophils Absolute: 3.9 10*3/uL (ref 1.4–7.0)
Neutrophils: 80 %
Platelets: 253 10*3/uL (ref 150–450)
RBC: 4.48 x10E6/uL (ref 3.77–5.28)
RDW: 12.4 % (ref 11.7–15.4)
WBC: 4.8 10*3/uL (ref 3.4–10.8)

## 2020-10-28 NOTE — Progress Notes (Signed)
Virtual Visit via Video Note The purpose of this virtual visit is to provide medical care while limiting exposure to the novel coronavirus.    Consent was obtained for video visit:  yes Answered questions that patient had about telehealth interaction:  yes I discussed the limitations, risks, security and privacy concerns of performing an evaluation and management service by telemedicine. I also discussed with the patient that there may be a patient responsible charge related to this service. The patient expressed understanding and agreed to proceed.  Pt location: Home Physician Location: office Name of referring provider:  Anabel Halon, MD I connected with [name] at patients initiation/request on 10/29/2020 at 10:10 AM EDT by video enabled telemedicine application and verified that I am speaking with the correct person using two identifiers. Pt MRN:  [id number] Pt DOB:  01-26-1956 Video Participants:  [name]  Assessment and Plan:   1.  Multiple sclerosis 2.  Migraine without aura 3.  Low back pain - she has degenerative disc disease of lumbar spine but due to increased pain and findings of vertebral fractures, will check MRI lumbar spine  1.  DMT:  Mayzent 1mg  daily 2.  Migraine prevention:  zonisamide 100mg  QD 3.  Migraine rescue:  Sumatriptan 100mg  4.  Neuropathic pain:  Gabapentin 100mg  TID 5.  D3 50,000 IU every 7 days.  Advised to have her PCP check vitamin D next week when she has a follow up and have results faxed to me 6.  MRI of lumbar spine 7.  Repeat CBC with diff, CMP and vitamin D in 6 months 8.  Repeat MRI of brain with and without contrast in 6 months 9.  Follow up in 6 months (after repeat labs and MRI)  History of Present Illness:  Pamela Roman is a 34 year oldleft-handedwhitefemale who follows up for multiple sclerosis.  She is accompanied by her sister who supplements history.  UPDATE: Current DMT:  Mayzent 1mg  daily (since July 2021). Other  current medications:Sertraline 25mg  daily; sumatriptan 100mg  PRN; D3 50000 IU weekly; levothyroxine; Aleve BID for pain, gabapentin 100mg  TID; zonisamide 100mg  QD  Repeat MRI of brain with and without contrast on 04/14/2020 personally reviewed showed a subcentimeter focus of enhancement within the left posterior medulla, consistent with active demyelination.Repeat MRI of brain with and without contrast on 09/16/2020 was stable with resolution of the previous small active lesion.  10/23/2020 LABS:  CBC with diff with WBC 4.8, HGB 13.8, HCT 41.6, PLT 253, ALC 0.3; CMP with Na 147, K 4, Cl 108, CO2 23, Ca 9.1, BUN 25, Cr 0.71, t bili 0.4, ALP 98, AST 28, ALT 24.  Vision:No issues at this time. Motor: Bilateral leg weakness. Arms are okay. Sensory: She reports numbness in feet and  in legs. Pain: Low back pain.Lefthip pain. Bilateral sharp stabbing pain in bilateral thighs. She has degenerative disc disease of lumbar spine. Treats pain with Aleve.  She reports increased lumbar spinal pain recently and notes a protrusion as well.  At first she thought it was related to weight loss exposing her spine but is not sure.  She is concerned.  She does have history of vertebral fractures. Headaches:Severe pressure-like bi-temporal/retro-orbital pain with nausea. Responds to sumatriptan. Increased frequency as above.  Gait: Progressive gait decline. Uses lift power scooter. She needs assistance/walker now to ambulate. Bowel/Bladder: No Fatigue: yes Cognition: Memory and cognitive deficits. MOCA from 01/03/2019 reportedly 29/30. Mood: Some depression.  She is on disability. She reports frequent  bruises on her legs due to her falls.   Migraines:  Started zonisamide in September.  Now infrequent  Aborts quickly with sumatriptan.  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 She underwent LP and MRIs, which were consistent with MS. She initially started Betaseron and then Avonex until 2011, after which she was switched to Tysabri. Within the last 5 years, she has had progression of disease, but particularly worse since moving here in March. She has fallen 4 times. She started using a walker about 5 years ago.   She had an MS flare in May 2021, describing increased difficulty walking and bilateral lower extremity numbness and tingling.  For paresthesias, she was started on gabapentin.  MRI of spinal cord showed acute lesions.  Started Mayzent in July 2021 but patient noted increased paresthesias in the legs.  She was prescribed high-dose prednisone.  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 She then was receiving Solu-Medrol every month.  Imaging: 04/25/2019 MRI BRAIN W WO: CEREBRUM: Extensive multiple sclerosis plaques are visualized. These is seen adjacent to the tmporal horns bilaterally extending to surround the atria and occipital horns bilaterally and extensively throughout the periventricular white matter bilateral. These also extend into the bifrontal corona radiata and centrum semiovale. No involvement of brainstem or cerebellum. 07/24/2019 MRI BRAIN W WO: 1. Bilateral periventricular and radial white matter hyperintensities involving the frontal parietal and temporal regions bilaterally stable compared to 04/25/19. 2. Compared to prior study the white matter hyperintensities, T1 low signal foci associated with these, and the absence of contrast enhancement appears stable. 3. No evidence of new plaques or contrast enhancement to suggest new inflammatory foci. 12/23/2019 MRI CERVICAL SPINE W WO:  1. Extensive patchy T2 signal abnormality throughout the cervical spinal cord, consistent with demyelinating disease/multiple sclerosis.  Faint patchy postcontrast enhancement about a few lesions.  AT C4 AND C7, consistent with active demyelination.  2. Mild multilevel cervical spondylosis without significant spinal stenosis. Mild bilateral C6 foraminal narrowing. 12/23/2019 MRI THORACIC SPINE W WO:  1. Patchy signal abnormality throughout much of the thoracic spinal cord, consistent with demyelinating disease/multiple sclerosis. No evidence for active demyelination within the thoracic spinal cord.  2. Acute to subacute compression fracture extending through the inferior endplate of L2 with up to 40% height loss and 4 mm of bony retropulsion, incompletely assessed on this exam. Finding could be further evaluated with dedicated MRI of the lumbar spine as clinically desired.  3. Additional chronic lower thoracic compression fractures as above.  Past Medical History: Past Medical History:  Diagnosis Date  . Depression    [employer]rees[employer] 08/06/2020  . Hx of migraines   . Hyperglycemia   . Hypothyroidism   . Migraines   . Multiple sclerosis (HCC)     Medications: Outpatient Encounter Medications as of 10/29/2020  Medication Sig Note  . alendronate (FOSAMAX) 70 MG tablet Take 70 mg by mouth once a week. Take with a full glass of water on an empty stomach. 12/19/2019: Sundays   . diazepam (VALIUM) 5 MG tablet Take 1 tablet 30-40 minutes prior to MRI.   . gabapentin (NEURONTIN) 100 MG capsule Take 1 capsule (100 mg total) by mouth 3 (three) times daily. (Patient taking differently: Take 100 mg by mouth 4 (four) times daily.)   . levothyroxine (SYNTHROID) 50  MCG tablet Take 50 mcg by mouth daily before breakfast.    . potassium chloride (KLOR-CON) 8 MEQ tablet Take 8 mEq by mouth 3 (three) times a week.   . sertraline (ZOLOFT) 25 MG tablet Take 25 mg by mouth daily.   . simvastatin (ZOCOR) 20 MG tablet TAKE 1 TABLET(20 MG) BY MOUTH DAILY   . Siponimod Fumarate 0.25 MG TABS Take 1 mg by mouth daily.   . SUMAtriptan (IMITREX) 100 MG tablet Take  1 tablet (100 mg total) by mouth every 2 (two) hours as needed for migraine. May repeat in 2 hours if headache persists or recurs.   . Vitamin D, Ergocalciferol, (DRISDOL) 1.25 MG (50000 UNIT) CAPS capsule TAKE 1 CAPSULE ONCE EVERY 7 DAYS ON SUNDAYS   . zonisamide (ZONEGRAN) 100 MG capsule Take 1 capsule (100 mg total) by mouth daily.    No facility-administered encounter medications on file as of 10/29/2020.    Allergies: Allergies  Allergen Reactions  . Codeine Nausea And Vomiting    Family History: No family history on file.  Observations/Objective:   Height 5\' 3"  (1.6 m), weight 127 lb (57.6 kg). No acute distress.  Alert and oriented.  Speech fluent and not dysarthric.  Language intact. .   Follow Up Instructions:    -I discussed the assessment and treatment plan with the patient. The patient was provided an opportunity to ask questions and all were answered. The patient agreed with the plan and demonstrated an understanding of the instructions.   The patient was advised to call back or seek an in-person evaluation if the symptoms worsen or if the condition fails to improve as anticipated.    , DO

## 2020-10-29 ENCOUNTER — Other Ambulatory Visit: Payer: Self-pay

## 2020-10-29 ENCOUNTER — Telehealth (INDEPENDENT_AMBULATORY_CARE_PROVIDER_SITE_OTHER): Payer: Medicare Other | Admitting: Neurology

## 2020-10-29 ENCOUNTER — Encounter: Payer: Self-pay | Admitting: Neurology

## 2020-10-29 VITALS — Ht 63.0 in | Wt 127.0 lb

## 2020-10-29 DIAGNOSIS — G43009 Migraine without aura, not intractable, without status migrainosus: Secondary | ICD-10-CM | POA: Diagnosis not present

## 2020-10-29 DIAGNOSIS — E569 Vitamin deficiency, unspecified: Secondary | ICD-10-CM

## 2020-10-29 DIAGNOSIS — M5136 Other intervertebral disc degeneration, lumbar region: Secondary | ICD-10-CM | POA: Diagnosis not present

## 2020-10-29 DIAGNOSIS — G35 Multiple sclerosis: Secondary | ICD-10-CM | POA: Diagnosis not present

## 2020-10-29 DIAGNOSIS — S32009D Unspecified fracture of unspecified lumbar vertebra, subsequent encounter for fracture with routine healing: Secondary | ICD-10-CM

## 2020-10-29 DIAGNOSIS — E559 Vitamin D deficiency, unspecified: Secondary | ICD-10-CM

## 2020-10-29 MED ORDER — MAYZENT 0.25 MG PO TABS: 1.0000 mg | ORAL_TABLET | Freq: Every day | ORAL | 1 refills | Status: DC

## 2020-10-29 NOTE — Patient Instructions (Signed)
1. Continue Mayzent 1mg  daily 2.  zonisamide 100mg  daily 3.  Sumatriptan as needed for migraine.  Limit to no more than 2 days a week 4.  D3 50,000 IU daily.  Have vit D level checked at PCP and fax to me 5.  MRI lumbar spine now 6.  Repeat MRI brain in 6 months 7.  Repeat labs in 6 months

## 2020-10-30 ENCOUNTER — Telehealth: Payer: Self-pay | Admitting: Neurology

## 2020-10-30 ENCOUNTER — Ambulatory Visit: Payer: Commercial Indemnity | Admitting: Neurology

## 2020-10-30 DIAGNOSIS — G35 Multiple sclerosis: Secondary | ICD-10-CM

## 2020-10-30 NOTE — Telephone Encounter (Signed)
Patient has question about what kind of MRI she is having  Please call

## 2020-10-30 NOTE — Telephone Encounter (Signed)
Pt due for Lumbar Spine MRI and the MRI Brain due in 6 months.   Pt advised. Called [facility] to cancel appt for MRI Brain. New order added for Mri Brain 6 months from now.

## 2020-10-30 NOTE — Telephone Encounter (Signed)
Pt states that she forget to mentioned something during her virtual visit,., Pt wanted to let dr.Jaffe know that she is still having eye problems.  Pt wanted to know what Dr.Jaffe thinks.

## 2020-10-30 NOTE — Telephone Encounter (Signed)
Patient called and left a message requesting a call back from Dr. Moises Blood nurse. She said she has more information to give related to her virtual visit yesterday.

## 2020-11-01 ENCOUNTER — Telehealth: Payer: Self-pay

## 2020-11-01 NOTE — Telephone Encounter (Signed)
Telephone call from [employer], Jasmine December. Need  A new start from for pt Mayzent.   Please send completed from to the number on the form.  Please have provider sign and in patient signature line say signature on file.   Script portion should say no starter pack, mainteance dose 1 mg

## 2020-11-04 NOTE — Telephone Encounter (Signed)
I tried to submit PA for Mayzent medication and It wouldn't allow me to complete because they are still processing start form. I will look out for if PA is required after the process the start form information. Thanks

## 2020-11-05 NOTE — Telephone Encounter (Signed)
New form sent with 1 mg cap as written before.   Tried calling Mayzent lvm.

## 2020-11-06 ENCOUNTER — Other Ambulatory Visit: Payer: Self-pay | Admitting: *Deleted

## 2020-11-06 ENCOUNTER — Encounter: Payer: Self-pay | Admitting: Internal Medicine

## 2020-11-06 ENCOUNTER — Ambulatory Visit (INDEPENDENT_AMBULATORY_CARE_PROVIDER_SITE_OTHER): Payer: Medicare Other | Admitting: Internal Medicine

## 2020-11-06 ENCOUNTER — Other Ambulatory Visit: Payer: Self-pay

## 2020-11-06 VITALS — BP 148/78 | HR 78 | Resp 18 | Ht 63.0 in | Wt 125.1 lb

## 2020-11-06 DIAGNOSIS — R634 Abnormal weight loss: Secondary | ICD-10-CM

## 2020-11-06 DIAGNOSIS — E039 Hypothyroidism, unspecified: Secondary | ICD-10-CM | POA: Diagnosis not present

## 2020-11-06 DIAGNOSIS — G43809 Other migraine, not intractable, without status migrainosus: Secondary | ICD-10-CM | POA: Diagnosis not present

## 2020-11-06 DIAGNOSIS — Z0100 Encounter for examination of eyes and vision without abnormal findings: Secondary | ICD-10-CM

## 2020-11-06 DIAGNOSIS — M810 Age-related osteoporosis without current pathological fracture: Secondary | ICD-10-CM | POA: Diagnosis not present

## 2020-11-06 DIAGNOSIS — G35 Multiple sclerosis: Secondary | ICD-10-CM | POA: Diagnosis not present

## 2020-11-06 DIAGNOSIS — E785 Hyperlipidemia, unspecified: Secondary | ICD-10-CM

## 2020-11-06 DIAGNOSIS — E559 Vitamin D deficiency, unspecified: Secondary | ICD-10-CM

## 2020-11-06 NOTE — Patient Instructions (Signed)
Please continue to take medications as prescribed.  Please start taking Ensure supplements for nutritional support. Avoid skipping any meal. Continue to take at least 64 ounces of fluid in a day.

## 2020-11-06 NOTE — Assessment & Plan Note (Signed)
On Sumatriptan PRN On Zonisamide for ppx Follows up with Neurologist 

## 2020-11-06 NOTE — Progress Notes (Signed)
amb  

## 2020-11-06 NOTE — Assessment & Plan Note (Signed)
On Alendronate Vitamin D 50,000 IU once weekly Check Vitamin D level

## 2020-11-06 NOTE — Assessment & Plan Note (Signed)
On Simvastatin Check lipid profile 

## 2020-11-06 NOTE — Assessment & Plan Note (Signed)
On Levothyroxine 50 mcg QD Check TSH and free T4 

## 2020-11-06 NOTE — Progress Notes (Signed)
Established Patient Office Visit  Subjective:  Patient ID: Pamela Roman, female    DOB: Feb 25, 1956  Age: 65 y.o. MRN: [id number]  CC:  Chief Complaint  Patient presents with  . Follow-up    3 month follow up wt check has a place on back sticking out wanted to mention but she has mri scheduled through neuro     HPI Pamela Roman is a 65 year old female with past medical history of progressive multiple sclerosis, physical deconditioning, osteoporosis, multiple falls recently, hypothyroidism and depression who presents for follow up of her chronic medical conditions.  MS: She recently had MRI of brain, cervical and thoracic lumbar spine.   She takes Mayzent for MS and follows up with neurologist.  She reports having multiple falls in the last year, last one in 02/2020. She reports intermittent blurry vision. She has not had Ophthalmology visit yet.  She has lost about 2 lbs since last visit. She tries to eat regularly. Denies fever, fatigue or night sweats. Denies cough, dyspnea, nausea, vomiting, constipation, diarrhea, melena or hematochezia.  Hypothyroidism: Takes Levothyroxine regularly.  Past Medical History:  Diagnosis Date  . Depression    [employer] 08/06/2020  . Hx of migraines   . Hyperglycemia   . Hypothyroidism   . Migraines   . Multiple sclerosis ([place])     Past Surgical History:  Procedure Laterality Date  . CATARACT EXTRACTION W/PHACO Left 01/01/2020   Procedure: CATARACT EXTRACTION PHACO AND INTRAOCULAR LENS PLACEMENT LEFT EYE;  Surgeon: Baruch Goldmann, MD;  Location: [facility];  Service: Ophthalmology;  Laterality: Left;  CDE: 3.37  . CATARACT EXTRACTION W/PHACO Right 01/19/2020   Procedure: CATARACT EXTRACTION PHACO AND INTRAOCULAR LENS PLACEMENT RIGHT EYE;  Surgeon: Baruch Goldmann, MD;  Location: [facility];  Service: Ophthalmology;  Laterality: Right;  CDE: 3.16  . TONSILLECTOMY      History reviewed. No pertinent family history.  Social History   Socioeconomic  History  . Marital status: Widowed    Spouse name: Not on file  . Number of children: Not on file  . Years of education: Not on file  . Highest education level: Not on file  Occupational History  . Not on file  Tobacco Use  . Smoking status: Never Smoker  . Smokeless tobacco: Never Used  Vaping Use  . Vaping Use: Never used  Substance and Sexual Activity  . Alcohol use: Not Currently  . Drug use: Never  . Sexual activity: Not on file  Other Topics Concern  . Not on file  Social History Narrative   ** Merged History Encounter **       Lives with her sister two story home stays on the first floor Left handed   Social Determinants of Health   Financial Resource Strain: Not on file  Food Insecurity: Not on file  Transportation Needs: Not on file  Physical Activity: Not on file  Stress: Not on file  Social Connections: Not on file  Intimate Partner Violence: Not on file    Outpatient Medications Prior to Visit  Medication Sig Dispense Refill  . alendronate (FOSAMAX) 70 MG tablet Take 70 mg by mouth once a week. Take with a full glass of water on an empty stomach.    . gabapentin (NEURONTIN) 100 MG capsule Take 1 capsule (100 mg total) by mouth 3 (three) times daily. (Patient taking differently: Take 100 mg by mouth 4 (four) times daily.) 90 capsule 5  . levothyroxine (SYNTHROID) 50 MCG tablet Take  50 mcg by mouth daily before breakfast.     . potassium chloride (KLOR-CON) 8 MEQ tablet Take 8 mEq by mouth 3 (three) times a week.    . sertraline (ZOLOFT) 25 MG tablet Take 25 mg by mouth daily.    . simvastatin (ZOCOR) 20 MG tablet TAKE 1 TABLET(20 MG) BY MOUTH DAILY 90 tablet 0  . Siponimod Fumarate (MAYZENT) 0.25 MG TABS Take 1 mg by mouth daily. 360 tablet 1  . SUMAtriptan (IMITREX) 100 MG tablet Take 1 tablet (100 mg total) by mouth every 2 (two) hours as needed for migraine. May repeat in 2 hours if headache persists or recurs. 30 tablet 0  . Vitamin D, Ergocalciferol,  (DRISDOL) 1.25 MG (50000 UNIT) CAPS capsule TAKE 1 CAPSULE ONCE EVERY 7 DAYS ON SUNDAYS 5 capsule 5  . zonisamide (ZONEGRAN) 100 MG capsule Take 1 capsule (100 mg total) by mouth daily. 90 capsule 1   No facility-administered medications prior to visit.    Allergies  Allergen Reactions  . Codeine Nausea And Vomiting    ROS Review of Systems  Constitutional: Positive for unexpected weight change. Negative for chills and fever.  HENT: Negative for congestion, sinus pain, sore throat and voice change.   Eyes: Positive for visual disturbance. Negative for pain.  Respiratory: Negative for cough and shortness of breath.   Cardiovascular: Negative for chest pain and palpitations.  Gastrointestinal: Negative for constipation, diarrhea, nausea and vomiting.  Endocrine: Negative for polydipsia and polyuria.  Genitourinary: Negative for dysuria and hematuria.  Musculoskeletal: Negative for neck pain and neck stiffness.  Skin: Negative for rash.  Neurological: Positive for numbness. Negative for dizziness.  Psychiatric/Behavioral: Negative for agitation and behavioral problems.      Objective:    Physical Exam Vitals reviewed.  Constitutional:      General: She is not in acute distress.    Appearance: She is not diaphoretic.  HENT:     Head: Normocephalic and atraumatic.     Nose: Nose normal.     Mouth/Throat:     Mouth: Mucous membranes are moist.  Eyes:     General: No scleral icterus.    Extraocular Movements: Extraocular movements intact.  Cardiovascular:     Rate and Rhythm: Normal rate and regular rhythm.     Pulses: Normal pulses.     Heart sounds: Normal heart sounds. No murmur heard.   Pulmonary:     Breath sounds: Normal breath sounds. No wheezing or rales.  Abdominal:     Palpations: Abdomen is soft.     Tenderness: There is no abdominal tenderness.  Musculoskeletal:     Cervical back: Neck supple. No tenderness.     Right lower leg: No edema.     Left lower  leg: No edema.  Skin:    General: Skin is warm.     Findings: Bruising (Right leg from fall) present. No rash.  Neurological:     General: No focal deficit present.     Mental Status: She is alert and oriented to person, place, and time.     Sensory: Sensory deficit (B/l LE) present.     Motor: Weakness (B/l UE and LE - 4/5) present.  Psychiatric:        Mood and Affect: Mood normal.        Behavior: Behavior normal.     BP (!) 148/78 (BP Location: Right Arm, Patient Position: Sitting, Cuff Size: Normal)   Pulse 78   Resp 18   Ht  _0  (1.6 m)   Wt 125 lb 1.9 oz (56.8 kg)   SpO2 97%   BMI 22.16 kg/m  Wt Readings from Last 3 Encounters:  11/06/20 125 lb 1.9 oz (56.8 kg)  10/29/20 127 lb (57.6 kg)  08/09/20 128 lb (58.1 kg)     Health Maintenance Due  Topic Date Due  . Hepatitis C Screening  Never done  . HIV Screening  Never done  . TETANUS/TDAP  Never done  . PAP SMEAR-Modifier  Never done  . COLONOSCOPY (Pts 45-71yr Insurance coverage will need to be confirmed)  Never done    There are no preventive care reminders to display for this patient.  No results found for: TSH Lab Results  Component Value Date   WBC 4.8 10/23/2020   HGB 13.8 10/23/2020   HCT 41.6 10/23/2020   MCV 93 10/23/2020   PLT 253 10/23/2020   Lab Results  Component Value Date   NA 147 (H) 10/23/2020   K 4.0 10/23/2020   CO2 23 10/23/2020   GLUCOSE 95 10/23/2020   BUN 25 10/23/2020   CREATININE 0.71 10/23/2020   BILITOT 0.4 10/23/2020   ALKPHOS 98 10/23/2020   AST 28 10/23/2020   ALT 24 10/23/2020   PROT 6.5 10/23/2020   ALBUMIN 4.5 10/23/2020   CALCIUM 9.1 10/23/2020   EGFR 95 10/23/2020   No results found for: CHOL No results found for: HDL No results found for: LDLCALC No results found for: TRIG No results found for: CHOLHDL No results found for: HGBA1C    Assessment & Plan:   Problem List Items Addressed This Visit      Cardiovascular and Mediastinum   Migraine     On Sumatriptan PRN On Zonisamide for ppx Follows up with Neurologist        Endocrine   Hypothyroidism - Primary    On Levothyroxine 50 mcg QD Check TSH and free T4      Relevant Orders   TSH + free T4     Nervous and Auditory   Multiple sclerosis ([place]    On Mayzent 1 mg QD Progressive neurologic deficits Has paresthesia in the legs, physical deconditioning leading to wheelchair use Follows up with Neurologist - last visit note reviewed Recently had MRI of brain, cervical and thoracic spine        Musculoskeletal and Integument   Osteoporosis    On Alendronate Vitamin D 50,000 IU once weekly Check Vitamin D level        Other   HLD (hyperlipidemia)    On Simvastatin Check lipid profile      Relevant Orders   Lipid panel   Weight loss    Appears to be due to nutritional deficiency Advised to take Ensure supplement Check TSH and free T4      Relevant Orders   TSH + free T4    Other Visit Diagnoses    Vitamin D deficiency       Relevant Orders   Vitamin D (25 hydroxy)      No orders of the defined types were placed in this encounter.   Follow-up: Return in about 4 months (around 03/09/2021) for Weight check and routine follow up.    RLindell Spar MD

## 2020-11-06 NOTE — Assessment & Plan Note (Signed)
Appears to be due to nutritional deficiency Advised to take Ensure supplement Check TSH and free T4

## 2020-11-06 NOTE — Assessment & Plan Note (Addendum)
On Mayzent 1 mg QD Progressive neurologic deficits Has paresthesia in the legs, physical deconditioning leading to wheelchair use Follows up with Neurologist - last visit note reviewed Recently had MRI of brain, cervical and thoracic spine

## 2020-11-07 ENCOUNTER — Telehealth: Payer: Self-pay

## 2020-11-07 ENCOUNTER — Other Ambulatory Visit: Payer: Self-pay | Admitting: Neurology

## 2020-11-07 ENCOUNTER — Other Ambulatory Visit: Payer: Self-pay | Admitting: Internal Medicine

## 2020-11-07 LAB — TSH+FREE T4
Free T4: 1.31 ng/dL (ref 0.82–1.77)
TSH: 1.47 u[IU]/mL (ref 0.450–4.500)

## 2020-11-07 LAB — LIPID PANEL
Chol/HDL Ratio: 3.7 ratio (ref 0.0–4.4)
Cholesterol, Total: 200 mg/dL — ABNORMAL HIGH (ref 100–199)
HDL: 54 mg/dL (ref >39)
LDL Chol Calc (NIH): 123 mg/dL — ABNORMAL HIGH (ref 0–99)
Triglycerides: 129 mg/dL (ref 0–149)
VLDL Cholesterol Cal: 23 mg/dL (ref 5–40)

## 2020-11-07 LAB — VITAMIN D 25 HYDROXY (VIT D DEFICIENCY, FRACTURES): Vit D, 25-Hydroxy: 76.7 ng/mL (ref 30.0–100.0)

## 2020-11-07 NOTE — Telephone Encounter (Signed)
Patient called left voicemail returning call about lab results.

## 2020-11-11 NOTE — Telephone Encounter (Signed)
Pt advised of lab results with verbal understanding  

## 2020-11-15 ENCOUNTER — Other Ambulatory Visit: Payer: Self-pay

## 2020-11-15 ENCOUNTER — Other Ambulatory Visit: Payer: Commercial Indemnity

## 2020-11-15 ENCOUNTER — Ambulatory Visit
Admission: RE | Admit: 2020-11-15 | Discharge: 2020-11-15 | Disposition: A | Discharge status: Home / Self Care | Payer: Medicare Other | Source: Ambulatory Visit | Attending: Neurology | Admitting: Neurology

## 2020-11-15 DIAGNOSIS — S32009D Unspecified fracture of unspecified lumbar vertebra, subsequent encounter for fracture with routine healing: Secondary | ICD-10-CM

## 2020-11-15 DIAGNOSIS — M5136 Other intervertebral disc degeneration, lumbar region: Secondary | ICD-10-CM

## 2020-11-15 IMAGING — MR MR LUMBAR SPINE W/O CM
4 of 5 series · 24 of 48 positions shown · non-contrast
Comparison: MRI thoracic spine dated [DATE].

CLINICAL DATA: Chronic low back pain and difficulty walking.

EXAM:
MRI LUMBAR SPINE WITHOUT CONTRAST
TECHNIQUE: Multiplanar, multisequence MR imaging of the lumbar spine was
performed. No intravenous contrast was administered.

[Series 2: T2 · sagittal · 4.0mm · 0.53mm/px · 6 of 18 slices shown (1 of 2)]
[im 1/18]
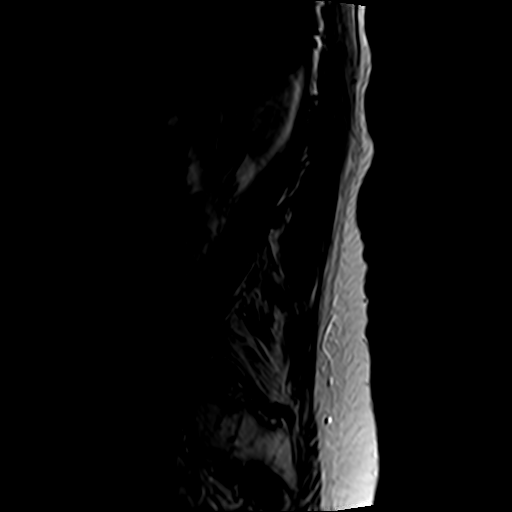
[im 4/18]
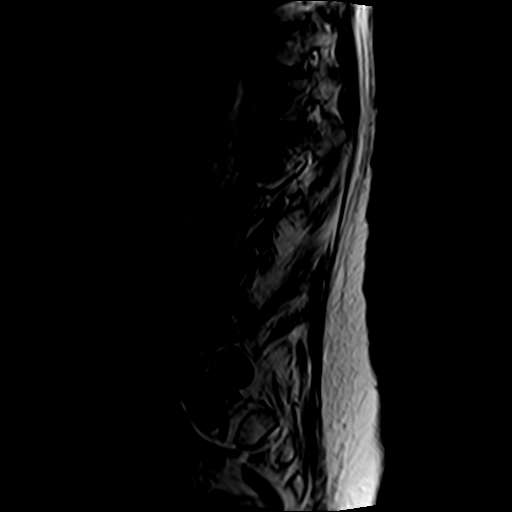
[im 7/18]
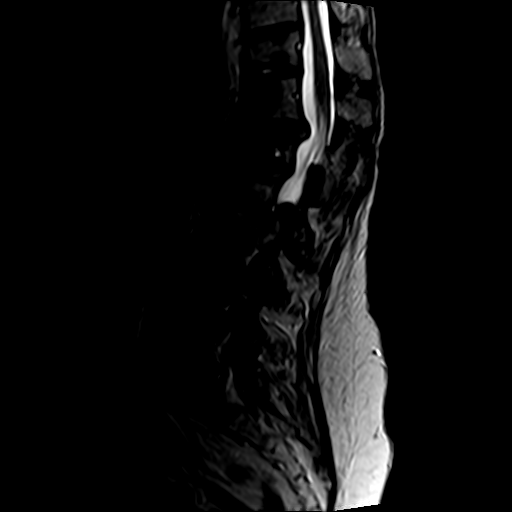
[im 11/18]
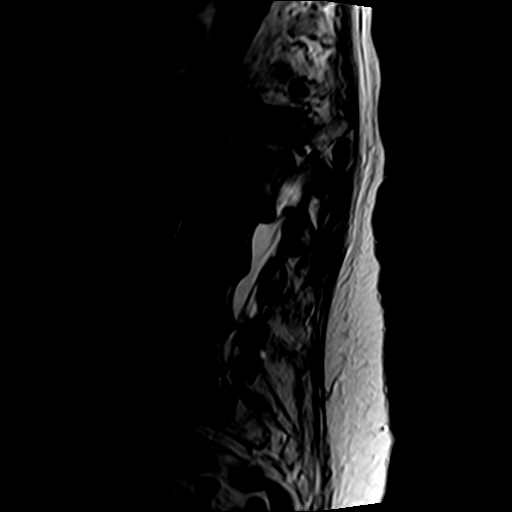
[im 14/18]
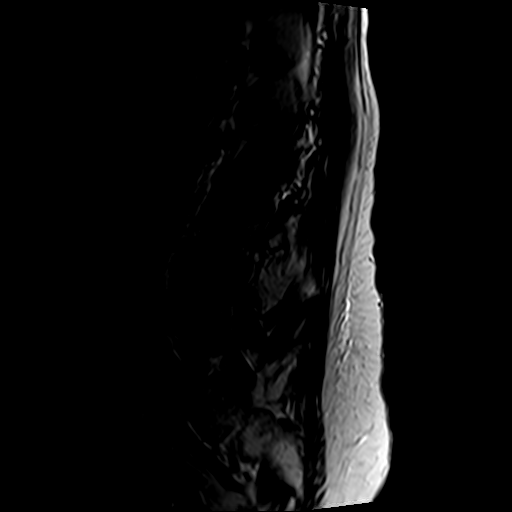
[im 18/18]
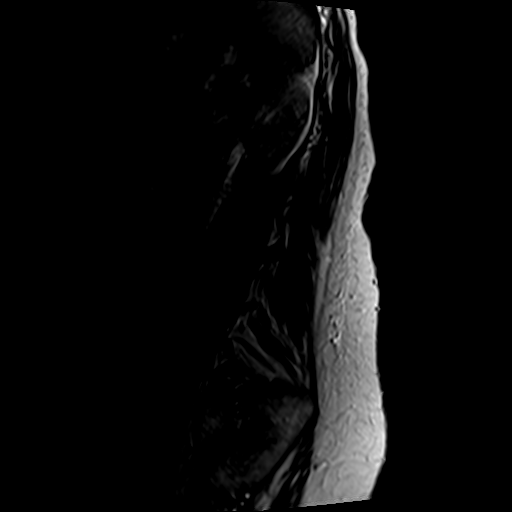

[Series 4: T1 · sagittal · 4.0mm · 0.53mm/px · 7 of 18 slices shown (1 of 2)]
[im 1/18]
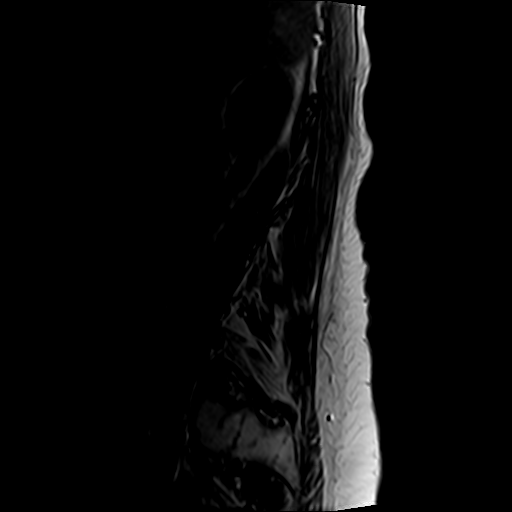
[im 3/18]
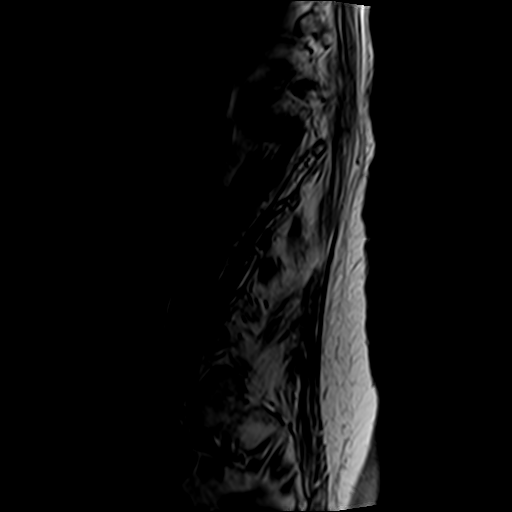
[im 6/18]
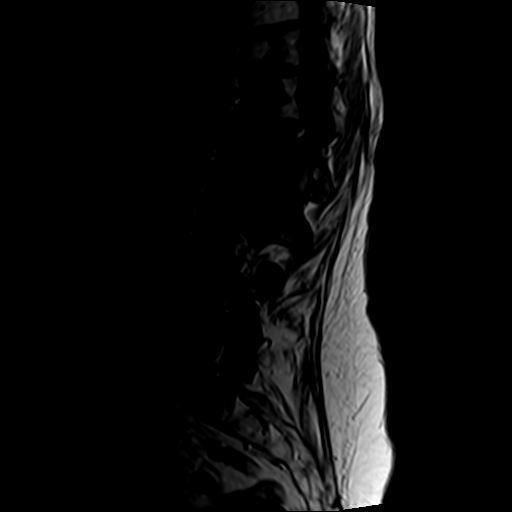
[im 9/18]
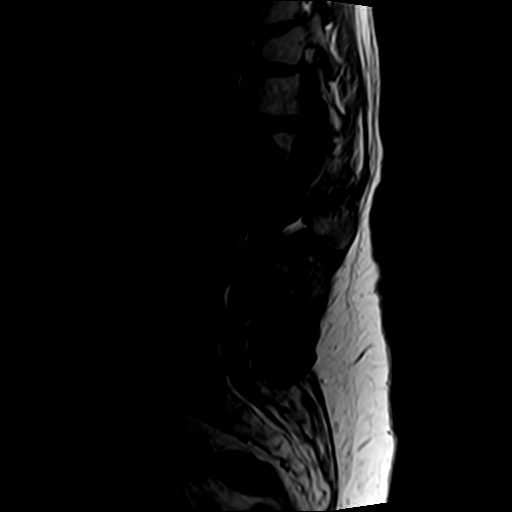
[im 12/18]
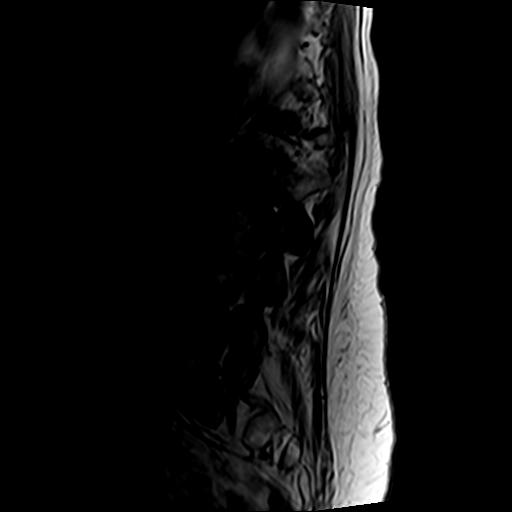
[im 15/18]
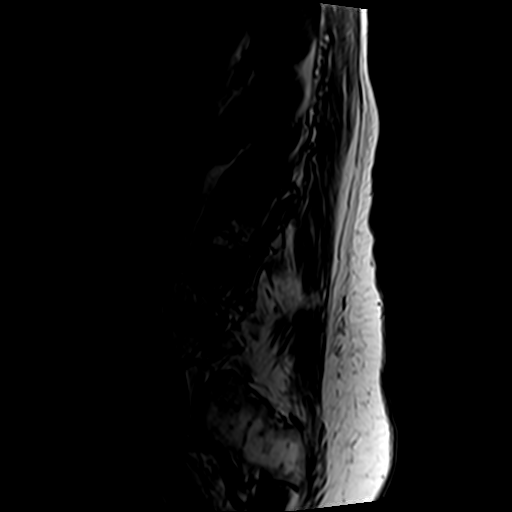
[im 18/18]
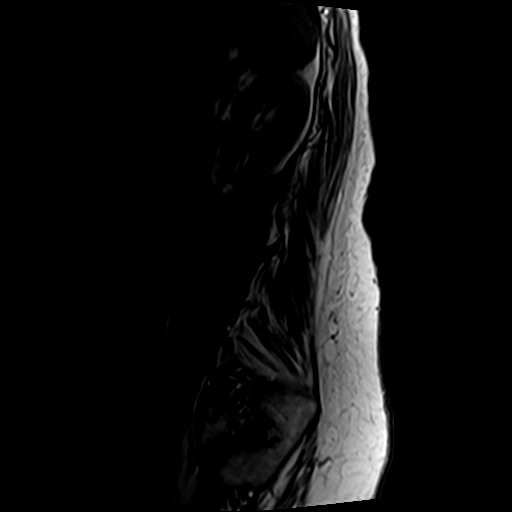

[Series 5: T2 · axial · 4.0mm · 0.70mm/px · z∈[-85,+106]mm · 8 of 38 slices shown (2 of 2)]
[im 1/38]
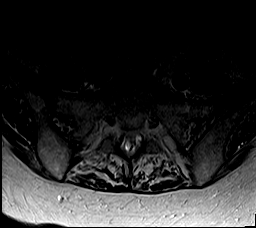
[im 6/38]
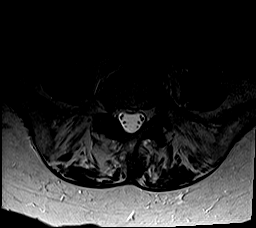
[im 12/38]
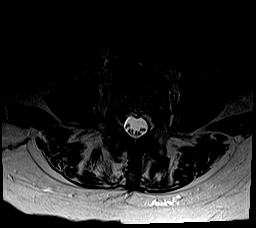
[im 18/38]
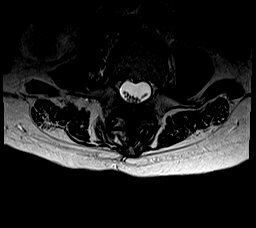
[im 20/38]
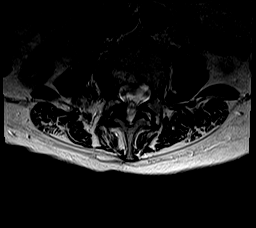
[im 26/38]
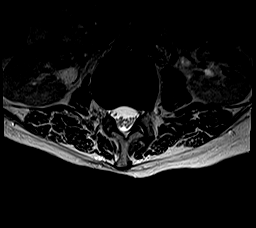
[im 32/38]
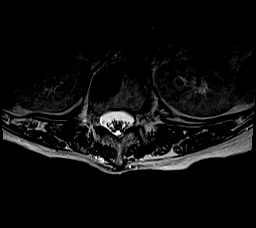
[im 38/38]
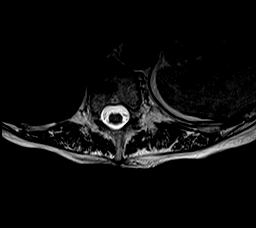

[Series 6: T1 · axial · 4.0mm · 0.35mm/px · z∈[-59,+75]mm · 3 of 38 slices shown (2 of 2)]
[im 6/38]
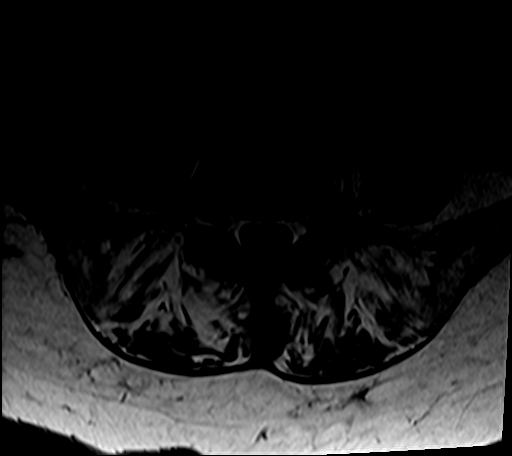
[im 20/38]
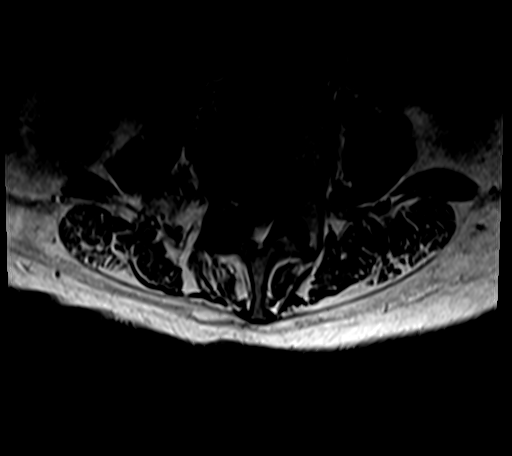
[im 32/38]
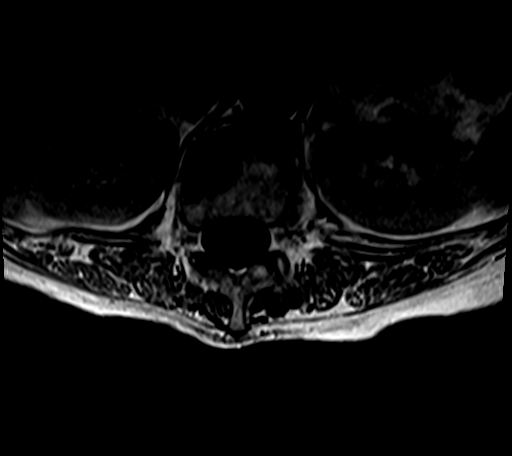

[24 of 48 positions shown; findings below may reference images not displayed]

FINDINGS: Segmentation: Assumed standard. The last well-formed disc space is
designated L5-S1 for the purposes of this report.

Alignment: Mild levocurvature. Trace L1-L2 and mild L2-L3
retrolisthesis.

Vertebrae: No acute fracture, evidence of discitis, or suspicious
bone lesion. Chronic mild-to-moderate T11 compression fracture
without retropulsion. Chronic severe L1 compression fracture status
post cement augmentation with 4 mm retropulsion of the
posterosuperior endplate. Chronic moderate L2 inferior endplate
compression fracture with 5 mm retropulsion of the posteroinferior
endplate. Unchanged T12 bone island versus prior cement
augmentation. Degenerative endplate marrow edema at L2-L3 and L4-L5.

Conus medullaris and cauda equina: Conus extends to the T12-L1
level. Conus and cauda equina appear normal.

Paraspinal and other soft tissues: Negative.

Disc levels:

T12-L1: Minimal disc bulging. Mild retropulsion of the L1
posterosuperior endplate. No stenosis.

L1-L2:  No significant disc bulge or herniation.  No stenosis.

L2-L3: L2 posteroinferior endplate retropulsion. No significant disc
bulge or herniation. Mild right facet arthropathy. Mild spinal canal
stenosis. Severe right and mild-to-moderate left neuroforaminal
stenosis.

L3-L4: Mild disc bulging. Mild bilateral lateral recess stenosis no
spinal canal or neuroforaminal stenosis.

L4-L5: Circumferential disc osteophyte complex. Mild bilateral facet
arthropathy with ligamentum flavum hypertrophy. Moderate spinal
canal stenosis. Severe bilateral lateral recess stenosis. Mild to
moderate bilateral neuroforaminal stenosis.

L5-S1:  Minimal disc bulging and endplate spurring.  No stenosis.
IMPRESSION: 1. Multilevel lumbar spondylosis as described above, worst at L4-L5
where there is moderate spinal canal stenosis and severe bilateral
lateral recess stenosis.
2. Chronic compression deformities of T11, L1, and L2. L2
retropulsion contributes to severe right neuroforaminal stenosis at
L2-L3.

## 2020-11-18 ENCOUNTER — Ambulatory Visit (INDEPENDENT_AMBULATORY_CARE_PROVIDER_SITE_OTHER): Payer: Medicare Other

## 2020-11-18 ENCOUNTER — Other Ambulatory Visit: Payer: Self-pay

## 2020-11-18 DIAGNOSIS — Z0000 Encounter for general adult medical examination without abnormal findings: Secondary | ICD-10-CM | POA: Diagnosis not present

## 2020-11-18 NOTE — Progress Notes (Signed)
PT advised of her mri Results.  Per Pt her Vitamin D levels per her PCP. Pt wanted to know if she should continue taking Vitamin D

## 2020-11-18 NOTE — Progress Notes (Signed)
Subjective:   Pamela Roman is a 65 y.o. female who presents for an Initial Medicare Annual Wellness Visit.  Review of Systems     Cardiac Risk Factors include: sedentary lifestyle     Objective:    Today's Vitals   11/18/20 0936  PainSc: 0-No pain   There is no height or weight on file to calculate BMI.  Advanced Directives 10/29/2020 03/22/2020 01/19/2020 01/01/2020 11/13/2019  Does Patient Have a Medical Advance Directive? No No No No No  Would patient like information on creating a medical advance directive? - - No - Patient declined No - Patient declined -    Current Medications (verified) Outpatient Encounter Medications as of 11/18/2020  Medication Sig  . alendronate (FOSAMAX) 70 MG tablet Take 70 mg by mouth once a week. Take with a full glass of water on an empty stomach.  . diazepam (VALIUM) 5 MG tablet TAKE 1 TABLET BY MOUTH 30 TO 40 MINUTES BEFORE MRI  . gabapentin (NEURONTIN) 100 MG capsule Take 1 capsule (100 mg total) by mouth 3 (three) times daily. (Patient taking differently: Take 100 mg by mouth 4 (four) times daily.)  . levothyroxine (SYNTHROID) 50 MCG tablet Take 50 mcg by mouth daily before breakfast.   . sertraline (ZOLOFT) 25 MG tablet Take 25 mg by mouth daily.  . simvastatin (ZOCOR) 20 MG tablet TAKE 1 TABLET(20 MG) BY MOUTH DAILY  . Siponimod Fumarate (MAYZENT) 0.25 MG TABS Take 1 mg by mouth daily.  . SUMAtriptan (IMITREX) 100 MG tablet Take 1 tablet (100 mg total) by mouth every 2 (two) hours as needed for migraine. May repeat in 2 hours if headache persists or recurs.  Marland Kitchen zonisamide (ZONEGRAN) 100 MG capsule Take 1 capsule (100 mg total) by mouth daily.  . [DISCONTINUED] potassium chloride (KLOR-CON) 8 MEQ tablet Take 8 mEq by mouth 3 (three) times a week.   No facility-administered encounter medications on file as of 11/18/2020.    Allergies (verified) Codeine   History: Past Medical History:  Diagnosis Date  . Depression    Phreesia 08/06/2020   . Hx of migraines   . Hyperglycemia   . Hypothyroidism   . Migraines   . Multiple sclerosis (HCC)    Past Surgical History:  Procedure Laterality Date  . CATARACT EXTRACTION W/PHACO Left 01/01/2020   Procedure: CATARACT EXTRACTION PHACO AND INTRAOCULAR LENS PLACEMENT LEFT EYE;  Surgeon: Fabio Pierce, MD;  Location: [facility];  Service: Ophthalmology;  Laterality: Left;  CDE: 3.37  . CATARACT EXTRACTION W/PHACO Right 01/19/2020   Procedure: CATARACT EXTRACTION PHACO AND INTRAOCULAR LENS PLACEMENT RIGHT EYE;  Surgeon: Fabio Pierce, MD;  Location: [facility];  Service: Ophthalmology;  Laterality: Right;  CDE: 3.16  . TONSILLECTOMY     No family history on file. Social History   Socioeconomic History  . Marital status: Widowed    Spouse name: Not on file  . Number of children: Not on file  . Years of education: Not on file  . Highest education level: Not on file  Occupational History  . Not on file  Tobacco Use  . Smoking status: Never Smoker  . Smokeless tobacco: Never Used  Vaping Use  . Vaping Use: Never used  Substance and Sexual Activity  . Alcohol use: Not Currently  . Drug use: Never  . Sexual activity: Not on file  Other Topics Concern  . Not on file  Social History Narrative   ** Merged History Encounter **  Lives with her sister two story home stays on the first floor Left handed   Social Determinants of Health   Financial Resource Strain: Low Risk   . Difficulty of Paying Living Expenses: Not hard at all  Food Insecurity: No Food Insecurity  . Worried About Programme researcher, broadcasting/film/video in the Last Year: Never true  . Ran Out of Food in the Last Year: Never true  Transportation Needs: No Transportation Needs  . Lack of Transportation (Medical): No  . Lack of Transportation (Non-Medical): No  Physical Activity: Insufficiently Active  . Days of Exercise per Week: 2 days  . Minutes of Exercise per Session: 30 min  Stress: No Stress Concern Present  . Feeling of  Stress : Not at all  Social Connections: Moderately Integrated  . Frequency of Communication with Friends and Family: More than three times a week  . Frequency of Social Gatherings with Friends and Family: Three times a week  . Attends Religious Services: 1 to 4 times per year  . Active Member of Clubs or Organizations: Yes  . Attends Banker Meetings: More than 4 times per year  . Marital Status: Widowed    Tobacco Counseling Counseling given: Not Answered   Clinical Intake:  Pre-visit preparation completed: Yes  Pain : No/denies pain Pain Score: 0-No pain     Nutritional Status: BMI of 19-24  Normal Diabetes: No  How often do you need to have someone help you when you read instructions, pamphlets, or other written materials from your doctor or pharmacy?: 1 - Never What is the last grade level you completed in school?: graduate school  Diabetic? no  Interpreter Needed?: No      Activities of Daily Living In your present state of health, do you have any difficulty performing the following activities: 11/18/2020 01/19/2020  Hearing? N -  Vision? Y -  Difficulty concentrating or making decisions? N -  Walking or climbing stairs? Y -  Comment has MS -  Dressing or bathing? N -  Doing errands, shopping? N N  Preparing Food and eating ? N -  Using the Toilet? N -  In the past six months, have you accidently leaked urine? Y -  Do you have problems with loss of bowel control? N -  Managing your Medications? N -  Managing your Finances? N -  Housekeeping or managing your Housekeeping? Y -  Some recent data might be hidden    Patient Care Team: Anabel Halon, MD as PCP - General (Internal Medicine) Patient, No Pcp Per (Inactive) (General Practice)  Indicate any recent Medical Services you may have received from other than [facility] providers in the past year (date may be approximate).     Assessment:   This is a routine wellness examination for  Pamela Roman.  Hearing/Vision screen No exam data present  Dietary issues and exercise activities discussed: Current Exercise Habits: Structured exercise class, Time (Minutes): 30, Frequency (Times/Week): 2, Weekly Exercise (Minutes/Week): 60, Intensity: Moderate  Goals Addressed            This Visit's Progress   . DIET - INCREASE WATER INTAKE      . Have 3 meals a day      . Prevent falls        Depression Screen PHQ 2/9 Scores 11/06/2020 08/09/2020  PHQ - 2 Score 0 0  PHQ- 9 Score 0 -    Fall Risk Fall Risk  11/18/2020 11/06/2020 10/29/2020 08/09/2020 03/22/2020  Falls in the past year? 1 1 0 1 1  Number falls in past yr: 1 1 0 0 1  Injury with Fall? 0 1 0 0 1  Risk for fall due to : - History of fall(s);Impaired balance/gait;Impaired mobility - No Fall Risks -  Follow up - Falls evaluation completed;Education provided;Falls prevention discussed;Follow up appointment - Falls evaluation completed -    FALL RISK PREVENTION PERTAINING TO THE HOME:  Any stairs in or around the home? No  If so, are there any without handrails? No  Home free of loose throw rugs in walkways, pet beds, electrical cords, etc? No  Adequate lighting in your home to reduce risk of falls? Yes   ASSISTIVE DEVICES UTILIZED TO PREVENT FALLS:  Life alert? No  Use of a cane, walker or w/c? Yes  walker Grab bars in the bathroom? Yes  Shower chair or bench in shower? No  Elevated toilet seat or a handicapped toilet? No   TIMED UP AND GO:  Was the test performed? Length of time to ambulate 10 feet:.     Cognitive Function:     6CIT Screen 11/18/2020  What Year? 0 points  What month? 0 points  What time? 0 points  Count back from 20 0 points  Months in reverse 0 points  Repeat phrase 0 points  Total Score 0    Immunizations Immunization History  Administered Date(s) Administered  . Moderna Sars-Covid-2 Vaccination 09/19/2019, 10/17/2019, 05/01/2020    TDAP status: Due, Education has been provided  regarding the importance of this vaccine. Advised may receive this vaccine at local pharmacy or Health Dept. Aware to provide a copy of the vaccination record if obtained from local pharmacy or Health Dept. Verbalized acceptance and understanding.  Flu Vaccine status: Declined, Education has been provided regarding the importance of this vaccine but patient still declined. Advised may receive this vaccine at local pharmacy or Health Dept. Aware to provide a copy of the vaccination record if obtained from local pharmacy or Health Dept. Verbalized acceptance and understanding.  Pneumococcal vaccine status: Declined,  Education has been provided regarding the importance of this vaccine but patient still declined. Advised may receive this vaccine at local pharmacy or Health Dept. Aware to provide a copy of the vaccination record if obtained from local pharmacy or Health Dept. Verbalized acceptance and understanding.   Covid-19 vaccine status: Completed vaccines  Qualifies for Shingles Vaccine? Yes   Zostavax completed No   Shingrix Completed?: No.    Education has been provided regarding the importance of this vaccine. Patient has been advised to call insurance company to determine out of pocket expense if they have not yet received this vaccine. Advised may also receive vaccine at local pharmacy or Health Dept. Verbalized acceptance and understanding.  Screening Tests Health Maintenance  Topic Date Due  . HIV Screening  Never done  . Hepatitis C Screening  Never done  . TETANUS/TDAP  Never done  . PAP SMEAR-Modifier  Never done  . COLONOSCOPY (Pts 45-17yrs Insurance coverage will need to be confirmed)  Never done  . INFLUENZA VACCINE  02/03/2021  . MAMMOGRAM  08/28/2022  . COVID-19 Vaccine  Completed  . HPV VACCINES  Aged Out    Health Maintenance  Health Maintenance Due  Topic Date Due  . HIV Screening  Never done  . Hepatitis C Screening  Never done  . TETANUS/TDAP  Never done  . PAP  SMEAR-Modifier  Never done  . COLONOSCOPY (Pts 45-68yrs  Insurance coverage will need to be confirmed)  Never done    Colorectal cancer screening: Type of screening: Colonoscopy. Completed 2014. Repeat every 10 years  Mammogram status: Completed yes. Repeat every year    Lung Cancer Screening: (Low Dose CT Chest recommended if Age 71-80 years, 30 pack-year currently smoking OR have quit w/in 15years.) does not qualify.   Lung Cancer Screening Referral: does not qualify   Additional Screening:  Hepatitis C Screening: does qualify; Completed ordered  Vision Screening: Recommended annual ophthalmology exams for early detection of glaucoma and other disorders of the eye. Is the patient up to date with their annual eye exam?  Yes  Who is the provider or what is the name of the office in which the patient attends annual eye exams? Dr Daiva Nakayama If pt is not established with a provider, would they like to be referred to a provider to establish care? No .   Dental Screening: Recommended annual dental exams for proper oral hygiene  Community Resource Referral / Chronic Care Management: CRR required this visit?  No   CCM required this visit?  No      Plan:     I have personally reviewed and noted the following in the patient's chart:   . Medical and social history . Use of alcohol, tobacco or illicit drugs  . Current medications and supplements including opioid prescriptions. Patient is not currently taking opioid prescriptions. . Functional ability and status . Nutritional status . Physical activity . Advanced directives . List of other physicians . Hospitalizations, surgeries, and ER visits in previous 12 months . Vitals . Screenings to include cognitive, depression, and falls . Referrals and appointments  In addition, I have reviewed and discussed with patient certain preventive protocols, quality metrics, and best practice recommendations. A written personalized care plan for  preventive services as well as general preventive health recommendations were provided to patient.     Everitt Amber, LPN, LPN   [id number]   Nurse Notes: Visit performed by telephone. Patient at home and supervising provider in the office. Patient consents to telephone visit. Time spent with pt 25 mins.

## 2020-11-18 NOTE — Patient Instructions (Addendum)
Pamela Roman , Thank you for taking time to come for your Medicare Wellness Visit. I appreciate your ongoing commitment to your health goals. Please review the following plan we discussed and let me know if I can assist you in the future.   Screening recommendations/referrals: Colonoscopy: unsure when last done- will discuss with Posey Pronto at next visit if due for a cologuard again (done every 3 years) Mammogram: up to date- due again in March 2023 Bone Density: not due until age 34 Recommended yearly ophthalmology/optometry visit for glaucoma screening and checkup Recommended yearly dental visit for hygiene and checkup  Vaccinations: Influenza vaccine: declined Pneumococcal vaccine: declijned Tdap vaccine: not covered as a preventative vaccine  Shingles vaccine: declined    Conditions/risks identified: fall risk  Next appointment: next wellness Nov 19 2021 or after   Preventive Care 40-64 Years, Female Preventive care refers to lifestyle choices and visits with your health care provider that can promote health and wellness. What does preventive care include?  A yearly physical exam. This is also called an annual well check.  Dental exams once or twice a year.  Routine eye exams. Ask your health care provider how often you should have your eyes checked.  Personal lifestyle choices, including:  Daily care of your teeth and gums.  Regular physical activity.  Eating a healthy diet.  Avoiding tobacco and drug use.  Limiting alcohol use.  Practicing safe sex.  Taking low-dose aspirin daily starting at age 19.  Taking vitamin and mineral supplements as recommended by your health care provider. What happens during an annual well check? The services and screenings done by your health care provider during your annual well check will depend on your age, overall health, lifestyle risk factors, and family history of disease. Counseling  Your health care provider may ask you questions  about your:  Alcohol use.  Tobacco use.  Drug use.  Emotional well-being.  Home and relationship well-being.  Sexual activity.  Eating habits.  Work and work Statistician.  Method of birth control.  Menstrual cycle.  Pregnancy history. Screening  You may have the following tests or measurements:  Height, weight, and BMI.  Blood pressure.  Lipid and cholesterol levels. These may be checked every 5 years, or more frequently if you are over 21 years old.  Skin check.  Lung cancer screening. You may have this screening every year starting at age 19 if you have a 30-pack-year history of smoking and currently smoke or have quit within the past 15 years.  Fecal occult blood test (FOBT) of the stool. You may have this test every year starting at age 42.  Flexible sigmoidoscopy or colonoscopy. You may have a sigmoidoscopy every 5 years or a colonoscopy every 10 years starting at age 37.  Hepatitis C blood test.  Hepatitis B blood test.  Sexually transmitted disease (STD) testing.  Diabetes screening. This is done by checking your blood sugar (glucose) after you have not eaten for a while (fasting). You may have this done every 1-3 years.  Mammogram. This may be done every 1-2 years. Talk to your health care provider about when you should start having regular mammograms. This may depend on whether you have a family history of breast cancer.  BRCA-related cancer screening. This may be done if you have a family history of breast, ovarian, tubal, or peritoneal cancers.  Pelvic exam and Pap test. This may be done every 3 years starting at age 3. Starting at age 76, this 77  be done every 5 years if you have a Pap test in combination with an HPV test.  Bone density scan. This is done to screen for osteoporosis. You may have this scan if you are at high risk for osteoporosis. Discuss your test results, treatment options, and if necessary, the need for more tests with your  health care provider. Vaccines  Your health care provider may recommend certain vaccines, such as:  Influenza vaccine. This is recommended every year.  Tetanus, diphtheria, and acellular pertussis (Tdap, Td) vaccine. You may need a Td booster every 10 years.  Zoster vaccine. You may need this after age 36.  Pneumococcal 13-valent conjugate (PCV13) vaccine. You may need this if you have certain conditions and were not previously vaccinated.  Pneumococcal polysaccharide (PPSV23) vaccine. You may need one or two doses if you smoke cigarettes or if you have certain conditions. Talk to your health care provider about which screenings and vaccines you need and how often you need them. This information is not intended to replace advice given to you by your health care provider. Make sure you discuss any questions you have with your health care provider. Document Released: 07/19/2015 Document Revised: 03/11/2016 Document Reviewed: 04/23/2015 Elsevier Interactive Patient Education  2017 [place] Prevention in the Home Falls can cause injuries. They can happen to people of all ages. There are many things you can do to make your home safe and to help prevent falls. What can I do on the outside of my home?  Regularly fix the edges of walkways and driveways and fix any cracks.  Remove anything that might make you trip as you walk through a door, such as a raised step or threshold.  Trim any bushes or trees on the path to your home.  Use bright outdoor lighting.  Clear any walking paths of anything that might make someone trip, such as rocks or tools.  Regularly check to see if handrails are loose or broken. Make sure that both sides of any steps have handrails.  Any raised decks and porches should have guardrails on the edges.  Have any leaves, snow, or ice cleared regularly.  Use sand or salt on walking paths during winter.  Clean up any spills in your garage right away.  This includes oil or grease spills. What can I do in the bathroom?  Use night lights.  Install grab bars by the toilet and in the tub and shower. Do not use towel bars as grab bars.  Use non-skid mats or decals in the tub or shower.  If you need to sit down in the shower, use a plastic, non-slip stool.  Keep the floor dry. Clean up any water that spills on the floor as soon as it happens.  Remove soap buildup in the tub or shower regularly.  Attach bath mats securely with double-sided non-slip rug tape.  Do not have throw rugs and other things on the floor that can make you trip. What can I do in the bedroom?  Use night lights.  Make sure that you have a light by your bed that is easy to reach.  Do not use any sheets or blankets that are too big for your bed. They should not hang down onto the floor.  Have a firm chair that has side arms. You can use this for support while you get dressed.  Do not have throw rugs and other things on the floor that can make you trip.  What can I do in the kitchen?  Clean up any spills right away.  Avoid walking on wet floors.  Keep items that you use a lot in easy-to-reach places.  If you need to reach something above you, use a strong step stool that has a grab bar.  Keep electrical cords out of the way.  Do not use floor polish or wax that makes floors slippery. If you must use wax, use non-skid floor wax.  Do not have throw rugs and other things on the floor that can make you trip. What can I do with my stairs?  Do not leave any items on the stairs.  Make sure that there are handrails on both sides of the stairs and use them. Fix handrails that are broken or loose. Make sure that handrails are as long as the stairways.  Check any carpeting to make sure that it is firmly attached to the stairs. Fix any carpet that is loose or worn.  Avoid having throw rugs at the top or bottom of the stairs. If you do have throw rugs, attach them  to the floor with carpet tape.  Make sure that you have a light switch at the top of the stairs and the bottom of the stairs. If you do not have them, ask someone to add them for you. What else can I do to help prevent falls?  Wear shoes that:  Do not have high heels.  Have rubber bottoms.  Are comfortable and fit you well.  Are closed at the toe. Do not wear sandals.  If you use a stepladder:  Make sure that it is fully opened. Do not climb a closed stepladder.  Make sure that both sides of the stepladder are locked into place.  Ask someone to hold it for you, if possible.  Clearly mark and make sure that you can see:  Any grab bars or handrails.  First and last steps.  Where the edge of each step is.  Use tools that help you move around (mobility aids) if they are needed. These include:  Canes.  Walkers.  Scooters.  Crutches.  Turn on the lights when you go into a dark area. Replace any light bulbs as soon as they burn out.  Set up your furniture so you have a clear path. Avoid moving your furniture around.  If any of your floors are uneven, fix them.  If there are any pets around you, be aware of where they are.  Review your medicines with your doctor. Some medicines can make you feel dizzy. This can increase your chance of falling. Ask your doctor what other things that you can do to help prevent falls. This information is not intended to replace advice given to you by your health care provider. Make sure you discuss any questions you have with your health care provider. Document Released: 04/18/2009 Document Revised: 11/28/2015 Document Reviewed: 07/27/2014 Elsevier Interactive Patient Education  2017 [employer].

## 2020-11-20 ENCOUNTER — Telehealth: Payer: Self-pay | Admitting: Neurology

## 2020-11-20 NOTE — Telephone Encounter (Signed)
Patient wants to switch her Mayzent .25 MG (4 a day) to a 1 MG tablet. She said she has trouble not dropping the little tablets.   She just got a refill so wants to start taking the new tablet at her next refill. Please send 90 day increments.  [facility]

## 2020-11-20 NOTE — Progress Notes (Signed)
Pt advised.

## 2020-11-20 NOTE — Telephone Encounter (Signed)
New script sent to [facility] with 1 mg tablets. Per rep it had to be changed. They are no longer offering the .25 mg

## 2020-11-27 LAB — HM DIABETES EYE EXAM

## 2020-11-28 ENCOUNTER — Other Ambulatory Visit: Payer: Self-pay | Admitting: *Deleted

## 2020-11-28 ENCOUNTER — Other Ambulatory Visit: Payer: Self-pay | Admitting: Neurology

## 2020-11-28 ENCOUNTER — Encounter: Payer: Self-pay | Admitting: *Deleted

## 2020-11-28 MED ORDER — LEVOTHYROXINE SODIUM 50 MCG PO TABS: 50.0000 ug | ORAL_TABLET | Freq: Every day | ORAL | 0 refills | Status: DC

## 2020-11-28 MED ORDER — SERTRALINE HCL 25 MG PO TABS: 25.0000 mg | ORAL_TABLET | Freq: Every day | ORAL | 0 refills | Status: DC

## 2020-11-28 MED ORDER — SIMVASTATIN 20 MG PO TABS: ORAL_TABLET | ORAL | 0 refills | Status: DC

## 2020-11-28 MED ORDER — ALENDRONATE SODIUM 70 MG PO TABS: 70.0000 mg | ORAL_TABLET | ORAL | 0 refills | Status: DC

## 2020-12-05 ENCOUNTER — Telehealth: Payer: Self-pay

## 2020-12-05 MED ORDER — GABAPENTIN 100 MG PO CAPS: 100.0000 mg | ORAL_CAPSULE | Freq: Three times a day (TID) | ORAL | 5 refills | Status: DC

## 2020-12-05 NOTE — Telephone Encounter (Signed)
Refill request received from [facility].    Gabapentin 100 TID

## 2021-01-01 ENCOUNTER — Other Ambulatory Visit: Payer: Self-pay | Admitting: *Deleted

## 2021-01-01 MED ORDER — LEVOTHYROXINE SODIUM 50 MCG PO TABS: 50.0000 ug | ORAL_TABLET | Freq: Every day | ORAL | 0 refills | Status: DC

## 2021-01-05 ENCOUNTER — Other Ambulatory Visit: Payer: Self-pay | Admitting: Internal Medicine

## 2021-01-14 ENCOUNTER — Telehealth: Payer: Self-pay | Admitting: Neurology

## 2021-01-14 ENCOUNTER — Other Ambulatory Visit: Payer: Self-pay

## 2021-01-14 MED ORDER — GABAPENTIN 100 MG PO CAPS: 100.0000 mg | ORAL_CAPSULE | Freq: Three times a day (TID) | ORAL | 3 refills | Status: DC

## 2021-01-14 NOTE — Telephone Encounter (Signed)
90 day supply request received from [facility]

## 2021-01-14 NOTE — Telephone Encounter (Signed)
LMoVM no one sent in assistance. Just sent in a 90 day supply of medication to [facility].

## 2021-01-14 NOTE — Telephone Encounter (Signed)
Pt called in stating she has noticed her medication has gotten cheaper when she picks it up and is wanting to find out if someone had contacted the [employer] Assistance program for her?

## 2021-02-03 ENCOUNTER — Other Ambulatory Visit: Payer: Self-pay

## 2021-02-03 DIAGNOSIS — G35 Multiple sclerosis: Secondary | ICD-10-CM

## 2021-02-03 MED ORDER — MAYZENT 1 MG PO TABS: 1.0000 mg | ORAL_TABLET | Freq: Every day | ORAL | 1 refills | Status: DC

## 2021-02-03 NOTE — Telephone Encounter (Signed)
Refill request received from [facility] Mayzent 1 mg daily.  90 day supply Mayzent sent to the pharmacy.

## 2021-02-07 ENCOUNTER — Telehealth: Payer: Self-pay

## 2021-02-07 DIAGNOSIS — G35 Multiple sclerosis: Secondary | ICD-10-CM

## 2021-02-07 MED ORDER — MAYZENT 1 MG PO TABS: 1.0000 mg | ORAL_TABLET | Freq: Every day | ORAL | 1 refills | Status: DC

## 2021-02-07 NOTE — Telephone Encounter (Signed)
Another refill form received from [facility] for Mayzent.  Script resent.

## 2021-03-02 ENCOUNTER — Other Ambulatory Visit: Payer: Self-pay | Admitting: Internal Medicine

## 2021-03-05 ENCOUNTER — Telehealth: Payer: Self-pay | Admitting: Neurology

## 2021-03-05 NOTE — Telephone Encounter (Signed)
Pt would like a call back to discuss her options. Her medicare plan will soon deny her for meds and she would like to know what alternative she has. She said she has been okay, she is just trying to be proactive

## 2021-03-06 NOTE — Telephone Encounter (Signed)
Per advised  to call her insurance to see what medications are covered.

## 2021-03-06 NOTE — Telephone Encounter (Signed)
Keilany called back, her MS medicine is covered under medicare. She said it will be expensive but they will talk to her about that once December comes.

## 2021-03-12 ENCOUNTER — Encounter: Payer: Self-pay | Admitting: Internal Medicine

## 2021-03-12 ENCOUNTER — Ambulatory Visit (INDEPENDENT_AMBULATORY_CARE_PROVIDER_SITE_OTHER): Payer: Medicare Other | Admitting: Internal Medicine

## 2021-03-12 ENCOUNTER — Other Ambulatory Visit: Payer: Self-pay

## 2021-03-12 VITALS — BP 138/78 | HR 56 | Resp 18 | Ht 63.0 in | Wt 124.8 lb

## 2021-03-12 DIAGNOSIS — M810 Age-related osteoporosis without current pathological fracture: Secondary | ICD-10-CM

## 2021-03-12 DIAGNOSIS — G35 Multiple sclerosis: Secondary | ICD-10-CM

## 2021-03-12 DIAGNOSIS — F321 Major depressive disorder, single episode, moderate: Secondary | ICD-10-CM

## 2021-03-12 DIAGNOSIS — R1314 Dysphagia, pharyngoesophageal phase: Secondary | ICD-10-CM

## 2021-03-12 DIAGNOSIS — R634 Abnormal weight loss: Secondary | ICD-10-CM | POA: Diagnosis not present

## 2021-03-12 DIAGNOSIS — E039 Hypothyroidism, unspecified: Secondary | ICD-10-CM | POA: Diagnosis not present

## 2021-03-12 NOTE — Assessment & Plan Note (Signed)
Well-controlled On Zoloft 

## 2021-03-12 NOTE — Assessment & Plan Note (Addendum)
On Levothyroxine 50 mcg QD Check TSH and free T4 in the next visit

## 2021-03-12 NOTE — Patient Instructions (Addendum)
Please continue to take medications as prescribed.  You are being scheduled to get Barium esophagram at [facility].

## 2021-03-12 NOTE — Progress Notes (Signed)
Established Patient Office Visit  Subjective:  Patient ID: Pamela Roman, female    DOB: 08/21/1955  Age: 65 y.o. MRN: [id number]  CC:  Chief Complaint  Patient presents with   Follow-up    4 month follow up weight check pt has been going to the [facility] and has personal trainer thinks this helps more than PT sometimes pt has difficulty swallowing and gets choked sometimes pt has been having memory issues and forgetting     HPI Pamela Roman is a 65 year old female with past medical history of progressive multiple sclerosis, physical deconditioning, osteoporosis, multiple falls, hypothyroidism and depression who presents for follow up of her chronic medical conditions.  Weight loss: Her weight is stable now. She takes Ensure supplements.  MS:  She takes Mayzent for MS and follows up with neurologist.  She reports having multiple falls in the last year, last one in 02/2020. She has been doing physical training at [facility] and her ambulation is better now.  Dysphagia: She c/o intermittent dysphagia to solids, but denies any heartburn or odynophagia.  Hypothyroidism: Takes Levothyroxine regularly.    Past Medical History:  Diagnosis Date   Depression    Phreesia 08/06/2020   Hx of migraines    Hyperglycemia    Hypothyroidism    Migraines    Multiple sclerosis ([place])     Past Surgical History:  Procedure Laterality Date   CATARACT EXTRACTION W/PHACO Left 01/01/2020   Procedure: CATARACT EXTRACTION PHACO AND INTRAOCULAR LENS PLACEMENT LEFT EYE;  Surgeon: Baruch Goldmann, MD;  Location: [facility];  Service: Ophthalmology;  Laterality: Left;  CDE: 3.37   CATARACT EXTRACTION W/PHACO Right 01/19/2020   Procedure: CATARACT EXTRACTION PHACO AND INTRAOCULAR LENS PLACEMENT RIGHT EYE;  Surgeon: Baruch Goldmann, MD;  Location: [facility];  Service: Ophthalmology;  Laterality: Right;  CDE: 3.16   TONSILLECTOMY      History reviewed. No pertinent family history.  Social History   Socioeconomic History    Marital status: Widowed    Spouse name: Not on file   Number of children: Not on file   Years of education: Not on file   Highest education level: Not on file  Occupational History   Not on file  Tobacco Use   Smoking status: Never   Smokeless tobacco: Never  Vaping Use   Vaping Use: Never used  Substance and Sexual Activity   Alcohol use: Not Currently   Drug use: Never   Sexual activity: Not on file  Other Topics Concern   Not on file  Social History Narrative   ** Merged History Encounter **       Lives with her sister two story home stays on the first floor Left handed   Social Determinants of Health   Financial Resource Strain: Low Risk    Difficulty of Paying Living Expenses: Not hard at all  Food Insecurity: No Food Insecurity   Worried About Charity fundraiser in the Last Year: Never true   [place] in the Last Year: Never true  Transportation Needs: No Transportation Needs   Lack of Transportation (Medical): No   Lack of Transportation (Non-Medical): No  Physical Activity: Insufficiently Active   Days of Exercise per Week: 2 days   Minutes of Exercise per Session: 30 min  Stress: No Stress Concern Present   Feeling of Stress : Not at all  Social Connections: Moderately Integrated   Frequency of Communication with Friends and Family: More than three  times a week   Frequency of Social Gatherings with Friends and Family: Three times a week   Attends Religious Services: 1 to 4 times per year   Active Member of Clubs or Organizations: Yes   Attends Archivist Meetings: More than 4 times per year   Marital Status: Widowed  Intimate Partner Violence: Not on file    Outpatient Medications Prior to Visit  Medication Sig Dispense Refill   alendronate (FOSAMAX) 70 MG tablet Take 1 tablet (70 mg total) by mouth once a week. Take with a full glass of water on an empty stomach. 90 tablet 0   diazepam (VALIUM) 5 MG tablet TAKE 1 TABLET BY MOUTH 30 TO  40 MINUTES BEFORE MRI 2 tablet 0   gabapentin (NEURONTIN) 100 MG capsule Take 1 capsule (100 mg total) by mouth 3 (three) times daily. 270 capsule 3   levothyroxine (SYNTHROID) 50 MCG tablet TAKE 1 TABLET DAILY BEFORE BREAKFAST 90 tablet 3   sertraline (ZOLOFT) 25 MG tablet Take 1 tablet (25 mg total) by mouth daily. 90 tablet 0   simvastatin (ZOCOR) 20 MG tablet TAKE 1 TABLET(20 MG) BY MOUTH DAILY 90 tablet 0   Siponimod Fumarate (MAYZENT) 1 MG TABS Take 1 mg by mouth daily. 90 tablet 1   SUMAtriptan (IMITREX) 100 MG tablet Take 1 tablet (100 mg total) by mouth every 2 (two) hours as needed for migraine. May repeat in 2 hours if headache persists or recurs. 30 tablet 0   zonisamide (ZONEGRAN) 100 MG capsule TAKE 1 CAPSULE DAILY 90 capsule 3   No facility-administered medications prior to visit.    Allergies  Allergen Reactions   Codeine Nausea And Vomiting    ROS Review of Systems  Constitutional:  Negative for chills and fever.  HENT:  Negative for congestion, sinus pain, sore throat and voice change.   Eyes:  Positive for visual disturbance. Negative for pain.  Respiratory:  Negative for cough and shortness of breath.   Cardiovascular:  Negative for chest pain and palpitations.  Gastrointestinal:  Negative for constipation, diarrhea, nausea and vomiting.  Endocrine: Negative for polydipsia and polyuria.  Genitourinary:  Negative for dysuria and hematuria.  Musculoskeletal:  Negative for neck pain and neck stiffness.  Skin:  Negative for rash.  Neurological:  Positive for numbness. Negative for dizziness.  Psychiatric/Behavioral:  Negative for agitation and behavioral problems.      Objective:    Physical Exam Vitals reviewed.  Constitutional:      General: She is not in acute distress.    Appearance: She is not diaphoretic.  HENT:     Head: Normocephalic and atraumatic.     Nose: Nose normal.     Mouth/Throat:     Mouth: Mucous membranes are moist.  Eyes:     General:  No scleral icterus.    Extraocular Movements: Extraocular movements intact.  Cardiovascular:     Rate and Rhythm: Normal rate and regular rhythm.     Pulses: Normal pulses.     Heart sounds: Normal heart sounds. No murmur heard. Pulmonary:     Breath sounds: Normal breath sounds. No wheezing or rales.  Abdominal:     Palpations: Abdomen is soft.     Tenderness: There is no abdominal tenderness.  Musculoskeletal:     Cervical back: Neck supple. No tenderness.     Right lower leg: No edema.     Left lower leg: No edema.  Skin:    General: Skin is warm.  Findings: Bruising (Right leg from fall) present. No rash.  Neurological:     General: No focal deficit present.     Mental Status: She is alert and oriented to person, place, and time.     Sensory: Sensory deficit (B/l LE) present.     Motor: Weakness (B/l UE and LE - 4/5) present.  Psychiatric:        Mood and Affect: Mood normal.        Behavior: Behavior normal.    BP 138/78 (BP Location: Left Arm, Patient Position: Sitting, Cuff Size: Normal)   Pulse (!) 56   Resp 18   Ht 5' 3"  (1.6 m)   Wt 124 lb 12.8 oz (56.6 kg)   SpO2 100%   BMI 22.11 kg/m  Wt Readings from Last 3 Encounters:  03/12/21 124 lb 12.8 oz (56.6 kg)  11/06/20 125 lb 1.9 oz (56.8 kg)  10/29/20 127 lb (57.6 kg)     Health Maintenance Due  Topic Date Due   HIV Screening  Never done   Hepatitis C Screening  Never done   TETANUS/TDAP  Never done   PAP SMEAR-Modifier  Never done   COLONOSCOPY (Pts 45-38yr Insurance coverage will need to be confirmed)  Never done   Zoster Vaccines- Shingrix (1 of 2) Never done   COVID-19 Vaccine (4 - Booster for Moderna series) 09/01/2020    There are no preventive care reminders to display for this patient.  Lab Results  Component Value Date   TSH 1.470 11/06/2020   Lab Results  Component Value Date   WBC 4.8 10/23/2020   HGB 13.8 10/23/2020   HCT 41.6 10/23/2020   MCV 93 10/23/2020   PLT 253 10/23/2020    Lab Results  Component Value Date   NA 147 (H) 10/23/2020   K 4.0 10/23/2020   CO2 23 10/23/2020   GLUCOSE 95 10/23/2020   BUN 25 10/23/2020   CREATININE 0.71 10/23/2020   BILITOT 0.4 10/23/2020   ALKPHOS 98 10/23/2020   AST 28 10/23/2020   ALT 24 10/23/2020   PROT 6.5 10/23/2020   ALBUMIN 4.5 10/23/2020   CALCIUM 9.1 10/23/2020   EGFR 95 10/23/2020   Lab Results  Component Value Date   CHOL 200 (H) 11/06/2020   Lab Results  Component Value Date   HDL 54 11/06/2020   Lab Results  Component Value Date   LDLCALC 123 (H) 11/06/2020   Lab Results  Component Value Date   TRIG 129 11/06/2020   Lab Results  Component Value Date   CHOLHDL 3.7 11/06/2020   No results found for: HGBA1C    Assessment & Plan:   Problem List Items Addressed This Visit       Digestive   Pharyngoesophageal dysphagia    Check barium esophagram Advised to take small bites at a time for now Avoid hot and spicy food      Relevant Orders   DG ESOPHAGUS W SINGLE CM (SOL OR THIN BA)     Endocrine   Hypothyroidism - Primary    On Levothyroxine 50 mcg QD Check TSH and free T4 in the next visit        Nervous and Auditory   Multiple sclerosis ([place]    On Mayzent 1 mg QD Progressive neurologic deficits Has paresthesia in the legs, physical deconditioning leading to wheelchair use Follows up with Neurologist - last visit note reviewed  [facility]  better now in terms of ambulation.        Musculoskeletal and Integument   Osteoporosis    On Alendronate Vitamin D 50,000 IU once weekly        Other   Weight loss    Now stable compared to previous visit Appears to be due to nutritional deficiency Advised to continue Ensure supplement      Depression, major, single episode, moderate ([place])    Well-controlled On Zoloft       No orders of the defined types were placed in this encounter.   Follow-up: Return  in about 4 months (around 07/12/2021) for Dysphagia and weight loss.    Lindell Spar, MD

## 2021-03-12 NOTE — Assessment & Plan Note (Signed)
On Mayzent 1 mg QD °Progressive neurologic deficits °Has paresthesia in the legs, physical deconditioning leading to wheelchair use °Follows up with Neurologist - last visit note reviewed ° °She has been participating in physical training at [facility] and has been doing better now in terms of ambulation. °

## 2021-03-12 NOTE — Assessment & Plan Note (Signed)
Now stable compared to previous visit Appears to be due to nutritional deficiency Advised to continue Ensure supplement 

## 2021-03-12 NOTE — Assessment & Plan Note (Signed)
Check barium esophagram Advised to take small bites at a time for now Avoid hot and spicy food

## 2021-03-12 NOTE — Assessment & Plan Note (Signed)
On Alendronate Vitamin D 50,000 IU once weekly 

## 2021-03-18 ENCOUNTER — Telehealth: Payer: Self-pay | Admitting: Neurology

## 2021-03-18 MED ORDER — VITAMIN D (ERGOCALCIFEROL) 1.25 MG (50000 UNIT) PO CAPS: 50000.0000 [IU] | ORAL_CAPSULE | ORAL | 1 refills | Status: DC

## 2021-03-18 NOTE — Telephone Encounter (Signed)
Vitamin d refilled.

## 2021-03-26 ENCOUNTER — Other Ambulatory Visit: Payer: Self-pay | Admitting: Internal Medicine

## 2021-04-02 ENCOUNTER — Other Ambulatory Visit: Payer: Self-pay | Admitting: Internal Medicine

## 2021-04-09 ENCOUNTER — Telehealth: Payer: Self-pay | Admitting: Neurology

## 2021-04-09 NOTE — Telephone Encounter (Signed)
Pt is feeling better today, no numbness today. Just fyi. Thank you.

## 2021-04-09 NOTE — Telephone Encounter (Signed)
Pt called in to let jaffe know she woke up yesterday with her left leg numb from her knee down. As the day went by, she got better but it wore her out. Both feet have been numb .

## 2021-04-25 ENCOUNTER — Other Ambulatory Visit: Payer: Self-pay | Admitting: Neurology

## 2021-05-05 ENCOUNTER — Telehealth: Payer: Self-pay | Admitting: Neurology

## 2021-05-05 NOTE — Telephone Encounter (Signed)
Patient called and said one of other doctors told her not to get the flu vaccine this year.  She'd like Dr. Moises Blood opinion as it relates to her diagnosis.

## 2021-05-05 NOTE — Telephone Encounter (Signed)
Left message of Dr.Jaffe's answer

## 2021-05-12 ENCOUNTER — Telehealth: Payer: Self-pay | Admitting: Neurology

## 2021-05-12 NOTE — Telephone Encounter (Signed)
Patient called and said she is needing a referral to an optical neurologist for problems for eye problems.  She'd like Dr. Moises Blood recommendation. She has double vision and glasses don't seem to help.

## 2021-05-13 NOTE — Telephone Encounter (Signed)
Pt advised of Dr.Jaffe note, If this is a new issue (she denied visual problems at her last visit), then she needs to follow up first.  Did she see the ophthalmologist, and if so, what did they say..  Per Pt she has a Ophthalmologist she saw him in the Summer. He did not see anything wrong with her eyes..  Pt states she had this problem for a while she unable to see to write her checks. Per pt she thinks this could be related to her MS. Pt defer to see Dr.Jaffe right now and will discuss at her visit in January.

## 2021-05-20 ENCOUNTER — Other Ambulatory Visit: Payer: Self-pay | Admitting: Neurology

## 2021-05-20 DIAGNOSIS — G43009 Migraine without aura, not intractable, without status migrainosus: Secondary | ICD-10-CM

## 2021-06-03 ENCOUNTER — Telehealth: Payer: Self-pay | Admitting: Neurology

## 2021-06-03 NOTE — Telephone Encounter (Signed)
Pt called an informed that I have talked to the reimbursement rep and they are going to try and get her on the assistance program

## 2021-06-03 NOTE — Telephone Encounter (Signed)
Pt said she needs to know if there is a co pay assistance program that will her with Mayzent. Starting Vanessa Kick it will cost her 700 a month. She ordered 3 months so she is good till then

## 2021-06-03 NOTE — Telephone Encounter (Signed)
Called Lavell Islam [phone] about getting the pt on the assistance program

## 2021-06-04 NOTE — Progress Notes (Signed)
NEUROLOGY FOLLOW UP OFFICE NOTE  Pamela Roman [id number]  Assessment/Plan:   1.  Multiple sclerosis 2.  Migraine without aura 3.  Low back pain - she has degenerative disc disease of lumbar spine but due to increased pain and findings of vertebral fractures, will check MRI lumbar spine   1.  DMT:  Mayzent 1mg  daily 2.  Migraine prevention:  zonisamide 100mg  QD 3.  Migraine rescue:  Sumatriptan 100mg  4.  Neuropathic pain:  Gabapentin 100mg  TID 5.  D3 50,000 IU every 7 days.   6.  Repeat CBC with diff, CMP and vitamin D today and in 6 months 8.  Repeat MRI of brain/cervical/thoracic with and without contrast in 6 months 9.  Follow up in 6 months (after repeat labs and MRI)  Subjective:  Pamela Roman is a 65 year old left-handed white female who follows up for multiple sclerosis.  She is accompanied by her sister who supplements history.   UPDATE: Current DMT:  Mayzent 1mg  daily (since July 2021). Other current medications:  Sertraline 25mg  daily; sumatriptan 100mg  PRN; D3 50000 IU weekly; levothyroxine; Aleve BID for pain, gabapentin 100mg  TID; zonisamide 100mg  QD    10/23/2020 LABS: CBC with WBC 4.8, ALC 0.3; CMP with Na 147, K 4, Cl 108, CO2 23, Ca 9.1, glucose 95, BUN 25, Cr 0.71, GFR 95, t bili 0.4, ALP 98, AST 28, ALT 24 11/06/2020 LABS:  D 25-hydroxy 76.7   Vision:  If she looks at distance, she sees horizontal double vision.  Trouble writing and reading.  She had cataract surgery in 2021 and it didn't improve.  Likely related to INO. Motor: Bilateral leg weakness. [facility]  Doing well.  Feels stronger.   Arms are okay. Sensory:  She reports numbness in feet and  in legs. Pain:  Low back pain.  Left hip pain.  Bilateral sharp stabbing pain in bilateral thighs.  She has degenerative disc disease of lumbar spine. Treats pain with Aleve.  She reports increased lumbar spinal pain recently and notes a protrusion as well.  At first she thought  it was related to weight loss exposing her spine but is not sure.  She is concerned.  She does have history of vertebral fractures.  MRI Lumbar spine on 11/15/2020 personally reviewed showed multilevel spondylosis with moderate spinal canal stenosis and severe bilateral lateral recess stenosis at L4-L5 as well as chronic compression deformities of T11, L1, and L2, including severe right foraminal stenosis at L2-3.  Advised that if she has been experiencing lower extremity pain and weakness, then would refer to spine specialist.  She does not think that she needs to see one.     Headaches: Severe pressure-like bi-temporal/retro-orbital pain with nausea.  Responds to sumatriptan.  Increased frequency as above.   Gait:  Progressive gait decline.  Uses lift power scooter.  She needs assistance/walker now to ambulate.   Bowel/Bladder:  No Fatigue:  yes Cognition:  Word finding issues.   Mood:  Some depression.   Sister reportedly thinks she is very depressed and has a short-temper.   Migraines:  Started zonisamide in September.  Now infrequent  Aborts quickly with sumatriptan. On disability    HISTORY: She was diagnosed with multiple sclerosis in the early 1990s after experiencing right optic neuritis.  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  She underwent LP and MRIs, which were consistent with MS.  She  initially started Betaseron and then Avonex until 2011, after which she was switched to Tysabri.  Within the last 5 years, she has had progression of disease, but particularly worse since moving here in March.  She has fallen 4 times.  She started using a walker about 5 years ago.     She had an MS flare in May 2021, describing increased difficulty walking and bilateral lower extremity numbness and tingling.  For paresthesias, she was started on gabapentin.  MRI of spinal cord showed acute lesions.  Started Mayzent in July 2021 but patient noted increased paresthesias in the legs.  She  was prescribed high-dose prednisone.  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 She then was receiving Solu-Medrol every month.   Imaging: 09/16/2020 MRI BRAIN W WO:  stable with resolution of previous small active lesion 09/09/2020 MRI C & T-SPINE W WO:  1. Unchanged distribution of demyelinating lesions throughout the cervical spinal cord. Motion degraded images of the thoracic spine,but grossly unchanged. 2. No evidence of active demyelination within the cervical or thoracic spine.  04/14/2020 MRI BRAIN W WO:  subcentimeter focus of enhancement within the left posterior medulla, consistent with active demyelination. 12/23/2019 MRI CERVICAL SPINE W WO:  1. Extensive patchy T2 signal abnormality throughout the cervical spinal cord, consistent with demyelinating disease/multiple sclerosis. Faint patchy postcontrast enhancement about a few lesions.  AT C4 AND C7, consistent with active demyelination.  2. Mild multilevel cervical spondylosis without significant spinal stenosis. Mild bilateral C6 foraminal narrowing. 12/23/2019 MRI THORACIC SPINE W WO:  1. Patchy signal abnormality throughout much of the thoracic spinal cord, consistent with demyelinating disease/multiple sclerosis. No evidence for active demyelination within the thoracic spinal cord.  2. Acute to subacute compression fracture extending through the inferior endplate of L2 with up to 40% height loss and 4 mm of bony retropulsion, incompletely assessed on this exam. Finding could be further evaluated with dedicated MRI of the lumbar spine as clinically desired.  3. Additional chronic lower thoracic compression fractures as above. 07/24/2019 MRI BRAIN W WO:  1.  Bilateral periventricular and radial white matter hyperintensities involving the frontal parietal and temporal regions bilaterally stable compared to 04/25/19.  2.  Compared to  prior study the white matter hyperintensities, T1 low signal foci associated with these, and the absence of contrast enhancement appears stable.  3.  No evidence of new plaques or contrast enhancement to suggest new inflammatory foci. 04/25/2019 MRI BRAIN W WO:  CEREBRUM:  Extensive multiple sclerosis plaques are visualized.  These is seen adjacent to the tmporal horns bilaterally extending to surround the atria and occipital horns bilaterally and extensively throughout the periventricular white matter bilateral.  These also extend into the bifrontal corona radiata and centrum semiovale.  No involvement of brainstem or cerebellum.  PAST MEDICAL HISTORY: Past Medical History:  Diagnosis Date   Depression    Phreesia 08/06/2020   Hx of migraines    Hyperglycemia    Hypothyroidism    Migraines    Multiple sclerosis ([facility])     MEDICATIONS: Current Outpatient Medications on File Prior to Visit  Medication Sig Dispense Refill   alendronate (FOSAMAX) 70 MG tablet Take 1 tablet (70 mg total) by mouth once a week. Take with a full glass of water on an empty stomach. 90 tablet 0   diazepam (VALIUM) 5 MG tablet TAKE 1 TABLET BY MOUTH 30 TO  40 MINUTES BEFORE MRI 2 tablet 0   gabapentin (NEURONTIN) 100 MG capsule Take 1 capsule (100 mg total) by mouth 3 (three) times daily. 270 capsule 3   levothyroxine (SYNTHROID) 50 MCG tablet TAKE 1 TABLET DAILY BEFORE BREAKFAST 90 tablet 3   sertraline (ZOLOFT) 25 MG tablet TAKE 1 TABLET DAILY 90 tablet 3   simvastatin (ZOCOR) 20 MG tablet TAKE 1 TABLET DAILY 90 tablet 3   Siponimod Fumarate (MAYZENT) 1 MG TABS Take 1 mg by mouth daily. 90 tablet 1   SUMAtriptan (IMITREX) 100 MG tablet TAKE 1 TABLET EVERY 2 HOURS AS NEEDED FOR MIGRAINE. MAY REPEAT IN 2 HOURS IF HEADACHE PERSISTS OR RECURS 27 tablet 0   Vitamin D, Ergocalciferol, (DRISDOL) 1.25 MG (50000 UNIT) CAPS capsule TAKE 1 CAPSULE EVERY 7 DAYS 5 capsule 9   zonisamide (ZONEGRAN) 100 MG capsule TAKE 1 CAPSULE  DAILY 90 capsule 3   No current facility-administered medications on file prior to visit.    ALLERGIES: Allergies  Allergen Reactions   Codeine Nausea And Vomiting    FAMILY HISTORY: No family history on file.    Objective:  Blood pressure (!) 155/81, pulse 83, resp. rate 18, height 5\' 3"  (1.6 m), weight 125 lb (56.7 kg), SpO2 97 %. General: No acute distress.  Patient appears well-groomed.   Head:  Normocephalic/atraumatic Eyes:  Fundi examined but not visualized Neck: supple, no paraspinal tenderness, full range of motion Heart:  Regular rate and rhythm Lungs:  Clear to auscultation bilaterally Back: No paraspinal tenderness Neurological Exam: alert and oriented to person, place, and time. Attention span and concentration intact, recent and remote memory intact, fund of knowledge intact.  Speech fluent and not dysarthric, language intact.  Right afferent pupillary defect,right INO.  Otherwise, CN II-XII intact. Bulk and tone normal, muscle strength 3+/5 bilateral hp flexion, otherwise 5/5.  Sensation to pinprick and vibration reduced in lower extremities.  Deep tendon reflexes 2+ throughout, toes downgoing.  Finger to nose testing intact.  In wheelchair.  Gait deferred.     , DO  CC: Shon Millet, MD

## 2021-06-05 ENCOUNTER — Ambulatory Visit (INDEPENDENT_AMBULATORY_CARE_PROVIDER_SITE_OTHER): Payer: Medicare Other | Admitting: Neurology

## 2021-06-05 ENCOUNTER — Other Ambulatory Visit: Payer: Self-pay

## 2021-06-05 ENCOUNTER — Encounter: Payer: Self-pay | Admitting: Neurology

## 2021-06-05 ENCOUNTER — Other Ambulatory Visit (INDEPENDENT_AMBULATORY_CARE_PROVIDER_SITE_OTHER): Payer: Medicare Other

## 2021-06-05 VITALS — BP 155/81 | HR 83 | Resp 18 | Ht 63.0 in | Wt 125.0 lb

## 2021-06-05 DIAGNOSIS — G43009 Migraine without aura, not intractable, without status migrainosus: Secondary | ICD-10-CM

## 2021-06-05 DIAGNOSIS — E569 Vitamin deficiency, unspecified: Secondary | ICD-10-CM

## 2021-06-05 DIAGNOSIS — E559 Vitamin D deficiency, unspecified: Secondary | ICD-10-CM

## 2021-06-05 DIAGNOSIS — G35 Multiple sclerosis: Secondary | ICD-10-CM | POA: Diagnosis not present

## 2021-06-05 LAB — CBC WITH DIFFERENTIAL/PLATELET
Basophils Absolute: 0 10*3/uL (ref 0.0–0.1)
Basophils Relative: 0.2 % (ref 0.0–3.0)
Eosinophils Absolute: 0 10*3/uL (ref 0.0–0.7)
Eosinophils Relative: 0.6 % (ref 0.0–5.0)
HCT: 40.6 % (ref 36.0–46.0)
Hemoglobin: 13.4 g/dL (ref 12.0–15.0)
Lymphocytes Relative: 7.3 % — ABNORMAL LOW (ref 12.0–46.0)
Lymphs Abs: 0.4 10*3/uL — ABNORMAL LOW (ref 0.7–4.0)
MCHC: 33.1 g/dL (ref 30.0–36.0)
MCV: 92.6 fl (ref 78.0–100.0)
Monocytes Absolute: 0.6 10*3/uL (ref 0.1–1.0)
Monocytes Relative: 10 % (ref 3.0–12.0)
Neutro Abs: 4.6 10*3/uL (ref 1.4–7.7)
Neutrophils Relative %: 81.9 % — ABNORMAL HIGH (ref 43.0–77.0)
Platelets: 264 10*3/uL (ref 150.0–400.0)
RBC: 4.38 Mil/uL (ref 3.87–5.11)
RDW: 13.2 % (ref 11.5–15.5)
WBC: 5.6 10*3/uL (ref 4.0–10.5)

## 2021-06-05 LAB — COMPREHENSIVE METABOLIC PANEL
ALT: 20 U/L (ref 0–35)
AST: 21 U/L (ref 0–37)
Albumin: 4.4 g/dL (ref 3.5–5.2)
Alkaline Phosphatase: 101 U/L (ref 39–117)
BUN: 25 mg/dL — ABNORMAL HIGH (ref 6–23)
CO2: 28 mEq/L (ref 19–32)
Calcium: 9.6 mg/dL (ref 8.4–10.5)
Chloride: 107 mEq/L (ref 96–112)
Creatinine, Ser: 0.78 mg/dL (ref 0.40–1.20)
GFR: 79.97 mL/min (ref >60.00)
Glucose, Bld: 103 mg/dL — ABNORMAL HIGH (ref 70–99)
Potassium: 4.4 mEq/L (ref 3.5–5.1)
Sodium: 143 mEq/L (ref 135–145)
Total Bilirubin: 0.6 mg/dL (ref 0.2–1.2)
Total Protein: 6.8 g/dL (ref 6.0–8.3)

## 2021-06-05 LAB — VITAMIN D 25 HYDROXY (VIT D DEFICIENCY, FRACTURES): VITD: 82.35 ng/mL (ref 30.00–100.00)

## 2021-06-05 NOTE — Patient Instructions (Addendum)
Check CBC with diff, CMP and vit D today and again in 6 months Repeat MRI brain/cervical/thoracic with and without contrast in 6 months Continue Mayzent, zonisamide, sumatriptan as needed, gabapentin and D3 50,000 every 7 days Follow up in 6 months

## 2021-06-12 ENCOUNTER — Other Ambulatory Visit: Payer: Self-pay | Admitting: Neurology

## 2021-06-12 DIAGNOSIS — G43009 Migraine without aura, not intractable, without status migrainosus: Secondary | ICD-10-CM

## 2021-07-01 ENCOUNTER — Telehealth: Payer: Self-pay

## 2021-07-01 ENCOUNTER — Encounter: Payer: Self-pay | Admitting: Emergency Medicine

## 2021-07-01 ENCOUNTER — Ambulatory Visit
Admission: EM | Admit: 2021-07-01 | Discharge: 2021-07-01 | Disposition: A | Discharge status: Home / Self Care | Payer: Medicare Other | Attending: Family Medicine | Admitting: Family Medicine

## 2021-07-01 ENCOUNTER — Ambulatory Visit (INDEPENDENT_AMBULATORY_CARE_PROVIDER_SITE_OTHER): Payer: Medicare Other

## 2021-07-01 ENCOUNTER — Other Ambulatory Visit: Payer: Self-pay | Admitting: Family Medicine

## 2021-07-01 ENCOUNTER — Telehealth: Payer: Self-pay | Admitting: Neurology

## 2021-07-01 ENCOUNTER — Other Ambulatory Visit: Payer: Self-pay

## 2021-07-01 DIAGNOSIS — R079 Chest pain, unspecified: Secondary | ICD-10-CM

## 2021-07-01 DIAGNOSIS — W19XXXA Unspecified fall, initial encounter: Secondary | ICD-10-CM

## 2021-07-01 DIAGNOSIS — S20212A Contusion of left front wall of thorax, initial encounter: Secondary | ICD-10-CM

## 2021-07-01 IMAGING — DX DG RIBS W/ CHEST 3+V*L*
3 series · 3 of 3 positions shown · non-contrast
Comparison: None.

CLINICAL DATA: Left rib pain after fall 5 days ago.

EXAM:
LEFT RIBS AND CHEST - 3+ VIEW

[chest pa]
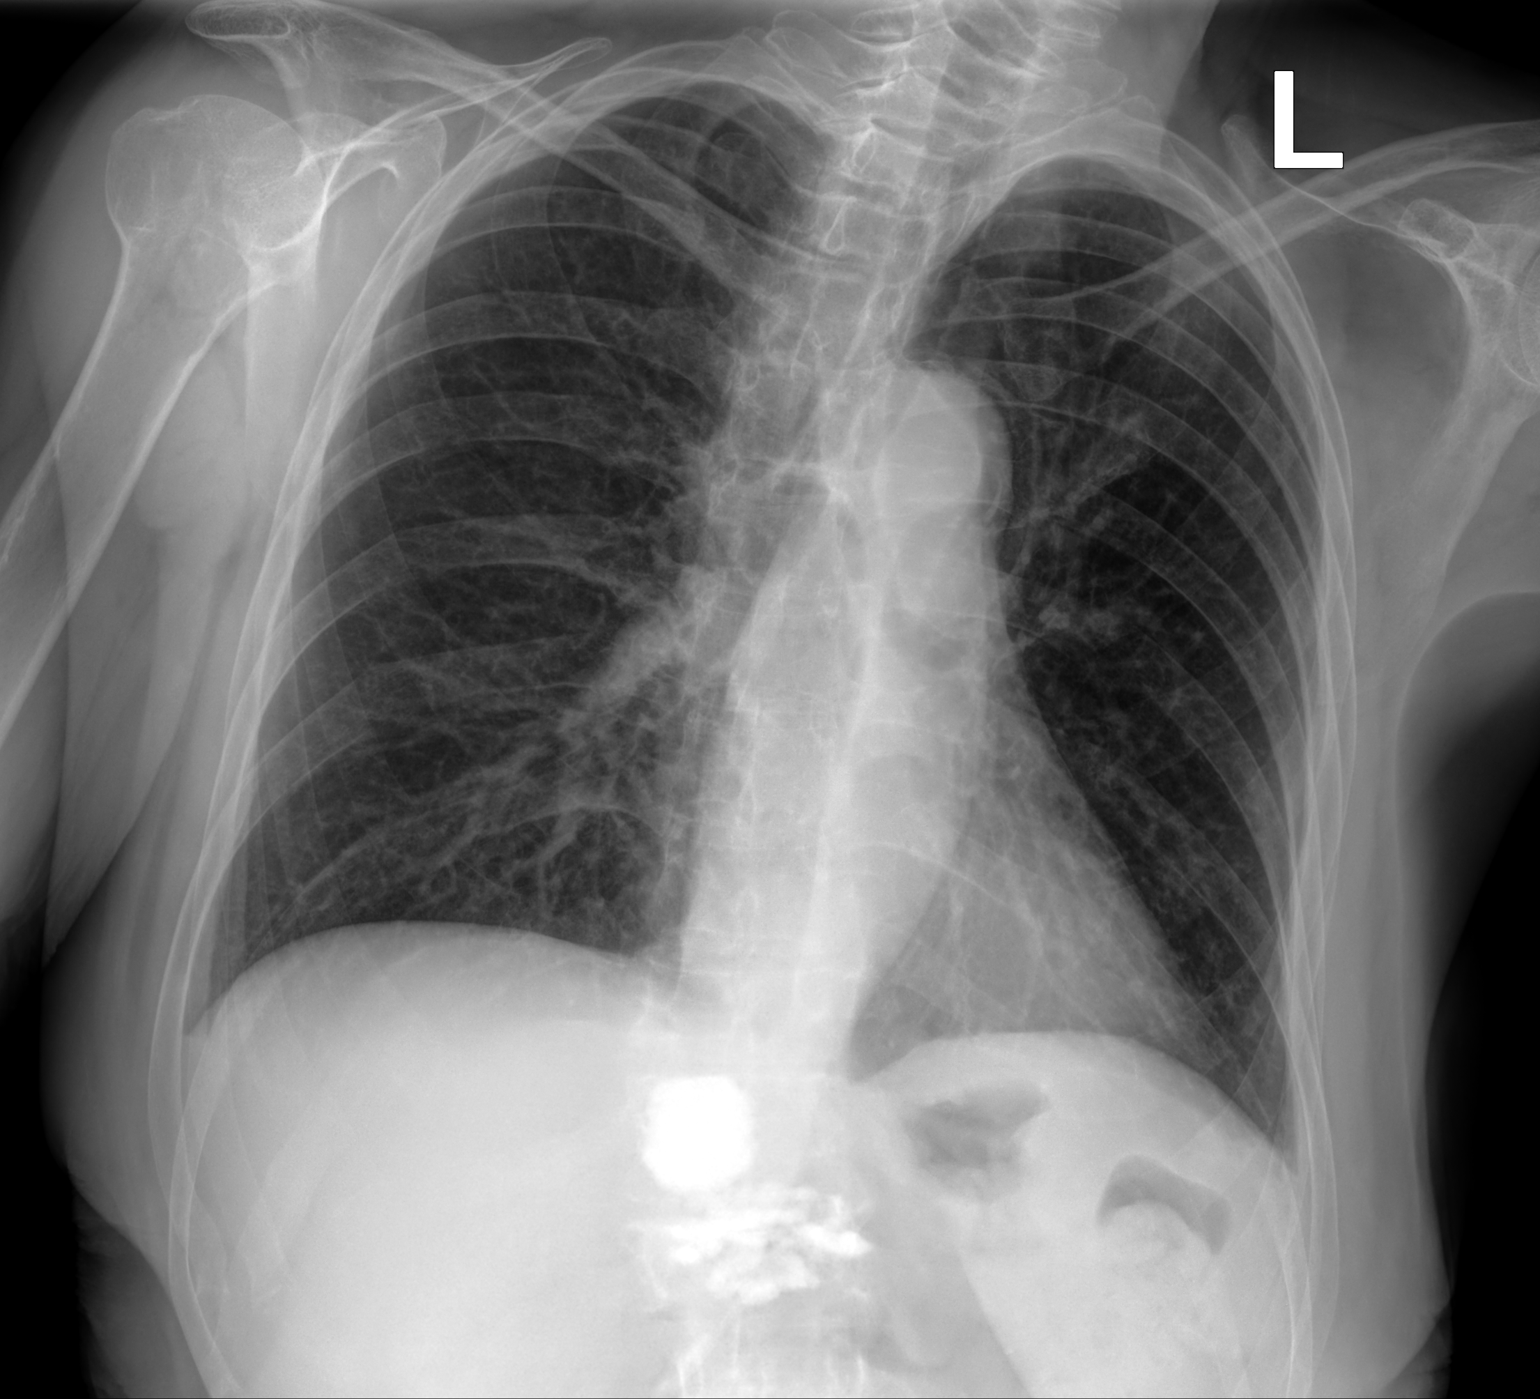

[hemithorax (ribs) ap]
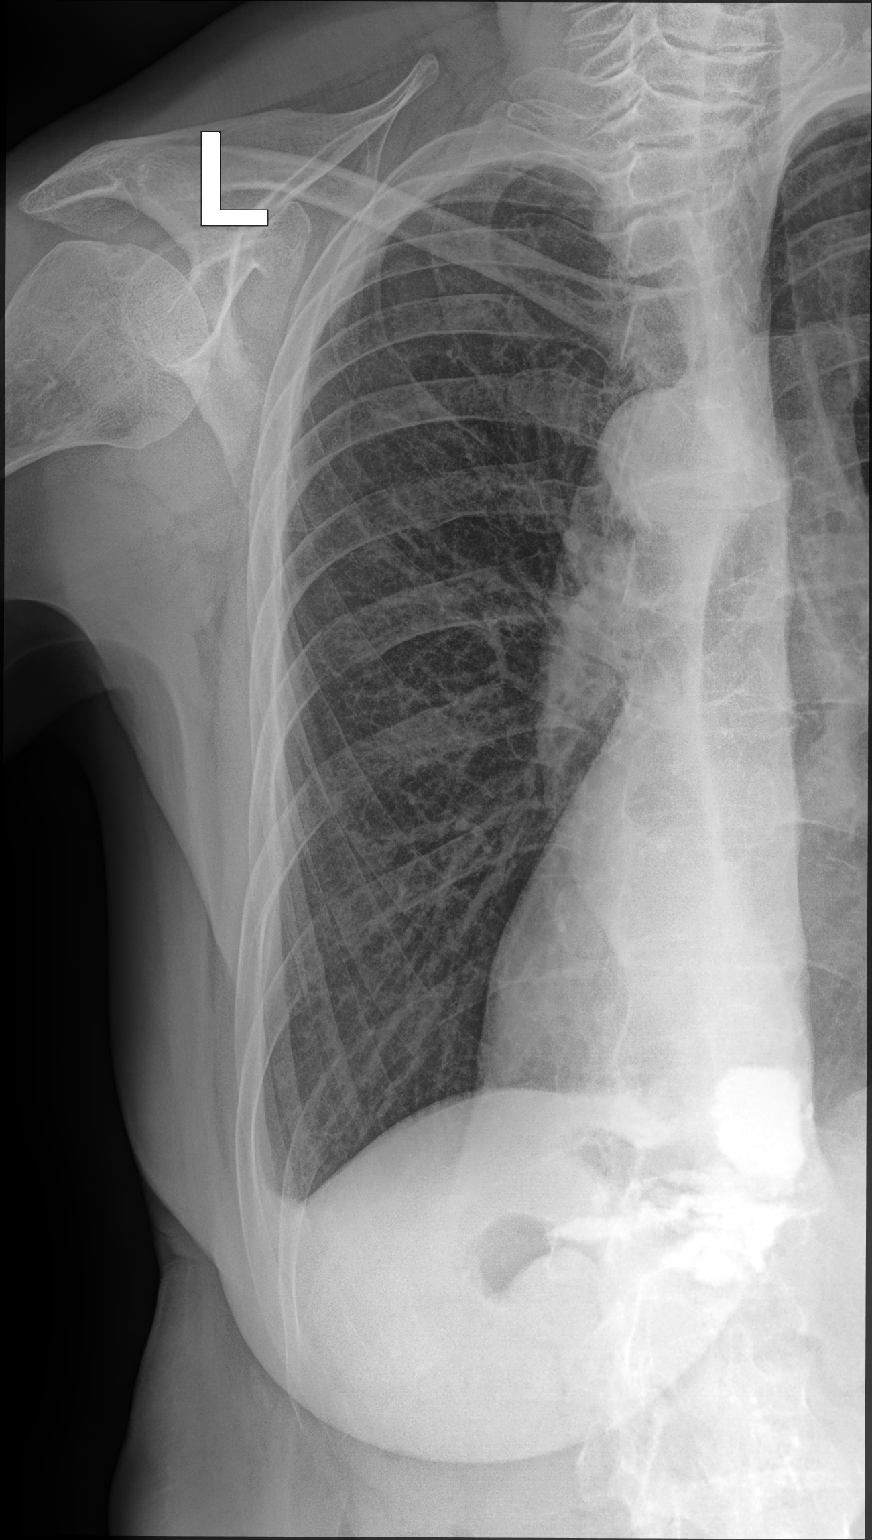

[hemithorax (ribs) mlo]
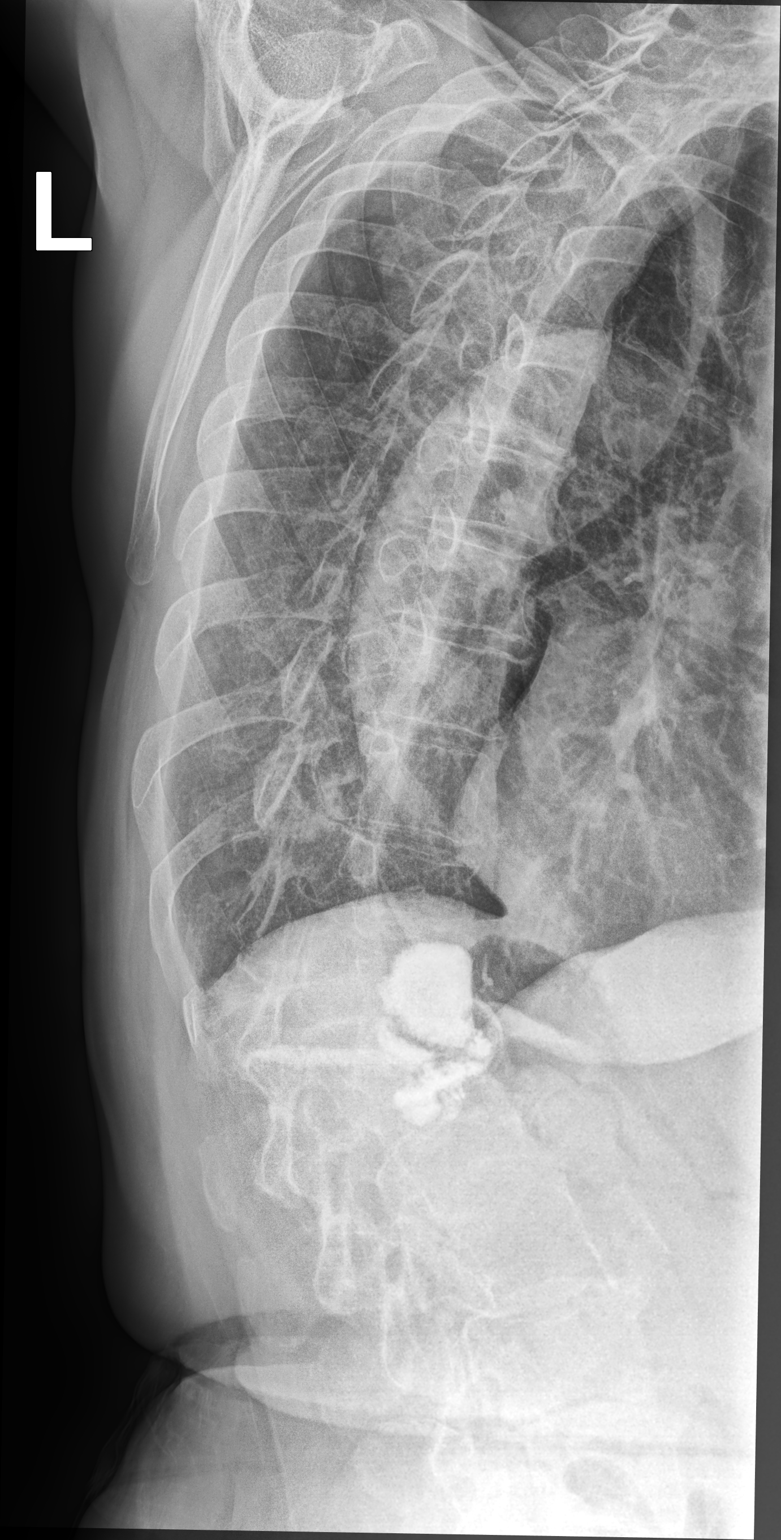

[3 of 3 positions shown; findings below may reference images not displayed]

FINDINGS: No fracture or other bone lesions are seen involving the ribs. There
is no evidence of pneumothorax or pleural effusion. Both lungs are
clear. Heart size and mediastinal contours are within normal limits.
IMPRESSION: Negative.

## 2021-07-01 MED ORDER — LIDOCAINE 5 % EX PTCH: 1.0000 | MEDICATED_PATCH | CUTANEOUS | 0 refills | Status: DC

## 2021-07-01 NOTE — ED Triage Notes (Signed)
Patient c/o fall that happened "last Friday".   Patient endorses " I was in my closet when I fell backwards".   Patient endorses LFT sided rib pain, patient states it worsens with ambulation, inhalation, and coughing.   History of MS   Patient used a CBD cream with no relief of symptoms.   Patient used gabapentin with no relief of symptoms.

## 2021-07-01 NOTE — Telephone Encounter (Signed)
I returned call to Gara Kroner - no answer - either # on Epic.  Had called and left a message for [place] to call her back.  Not sure what she needed.

## 2021-07-01 NOTE — Telephone Encounter (Signed)
She fell Friday and still having pain in left side. She wants to know if she should be worried.  She did not go to ER. This happened due to lack of balance. It hurts if she talks too loud and breathes too heavy. I told her she probably needs to be seen at her Primary care. She said she called and let them know as well

## 2021-07-01 NOTE — ED Provider Notes (Signed)
[facility]    CSN: [id number] Arrival date & time: 07/01/21  1416      History   Chief Complaint Chief Complaint  Patient presents with   Fall    HPI Pamela Roman is a 65 y.o. female.   Patient presenting today with 5 days of left lateral rib soreness that is worse with movement, deep breathing and coughing after a fall backwards onto a shelf in her closet.  She states that the shelf hit this area it is now sore during the fall but she did not injure anything else, hit her head or lose consciousness.  She has not noticed any bruising, difficulty breathing, chest pain and has not tried anything over-the-counter for pain.   Past Medical History:  Diagnosis Date   Depression    Phreesia 08/06/2020   Hx of migraines    Hyperglycemia    Hypothyroidism    Migraines    Multiple sclerosis ([place])     Patient Active Problem List   Diagnosis Date Noted   Pharyngoesophageal dysphagia 03/12/2021   Migraine 08/09/2020   Hypothyroidism 08/09/2020   HLD (hyperlipidemia) 08/09/2020   Osteoporosis 08/09/2020   Multiple falls 08/09/2020   Weight loss 08/09/2020   Depression, major, single episode, moderate ([place]) 08/09/2020   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   Spinal stenosis of lumbar region 03/21/2019   Multiple sclerosis ([place]) 11/15/2017   Thoracic compression fracture ([place]) 11/15/2017   Low back pain 11/15/2017    Past Surgical History:  Procedure Laterality Date   CATARACT EXTRACTION W/PHACO Left 01/01/2020   Procedure: CATARACT EXTRACTION PHACO AND INTRAOCULAR LENS PLACEMENT LEFT EYE;  Surgeon: Baruch Goldmann, MD;  Location: [facility];  Service: Ophthalmology;  Laterality: Left;  CDE: 3.37   CATARACT EXTRACTION W/PHACO Right 01/19/2020   Procedure: CATARACT EXTRACTION PHACO AND INTRAOCULAR LENS PLACEMENT RIGHT EYE;  Surgeon: Baruch Goldmann, MD;  Location: [facility];  Service: Ophthalmology;  Laterality: Right;  CDE: 3.16   TONSILLECTOMY      OB  History   No obstetric history on file.      Home Medications    Prior to Admission medications   Medication Sig Start Date End Date Taking? Authorizing Provider  alendronate (FOSAMAX) 70 MG tablet Take 1 tablet (70 mg total) by mouth once a week. Take with a full glass of water on an empty stomach. 11/28/20  Yes Lindell Spar, MD  gabapentin (NEURONTIN) 100 MG capsule Take 1 capsule (100 mg total) by mouth 3 (three) times daily. 01/14/21  Yes Tomi Likens, Adam R, DO  levothyroxine (SYNTHROID) 50 MCG tablet TAKE 1 TABLET DAILY BEFORE BREAKFAST 03/03/21  Yes Lindell Spar, MD  lidocaine (LIDODERM) 5 % Place 1 patch onto the skin daily. Remove & Discard patch within 12 hours or as directed by MD 07/01/21  Yes Volney American, PA-C  sertraline (ZOLOFT) 25 MG tablet TAKE 1 TABLET DAILY 04/02/21  Yes Lindell Spar, MD  simvastatin (ZOCOR) 20 MG tablet TAKE 1 TABLET DAILY 03/26/21  Yes Lindell Spar, MD  Siponimod Fumarate (MAYZENT) 1 MG TABS Take 1 mg by mouth daily. 02/07/21  Yes Jaffe, Adam R, DO  Vitamin D, Ergocalciferol, (DRISDOL) 1.25 MG (50000 UNIT) CAPS capsule TAKE 1 CAPSULE EVERY 7 DAYS 04/25/21  Yes Tomi Likens, Adam R, DO  zonisamide (ZONEGRAN) 100 MG capsule TAKE 1 CAPSULE DAILY 11/28/20  Yes Jaffe, Adam R, DO  diazepam (VALIUM) 5 MG tablet TAKE 1 TABLET BY MOUTH 30 TO 40  MINUTES BEFORE MRI 11/07/20   Pieter Partridge, DO  SUMAtriptan (IMITREX) 100 MG tablet TAKE 1 TABLET EVERY 2 HOURS AS NEEDED FOR MIGRAINE. MAY REPEAT IN 2 HOURS IF HEADACHE PERSISTS OR RECURS 05/21/21   Pieter Partridge, DO    Family History History reviewed. No pertinent family history.  Social History Social History   Tobacco Use   Smoking status: Never   Smokeless tobacco: Never  Vaping Use   Vaping Use: Never used  Substance Use Topics   Alcohol use: Not Currently   Drug use: Never     Allergies   Codeine   Review of Systems Review of Systems Per HPI  Physical Exam Triage Vital Signs ED Triage  Vitals  Enc Vitals Group     BP 07/01/21 1641 (!) 143/77     Pulse Rate 07/01/21 1641 75     Resp 07/01/21 1641 16     Temp 07/01/21 1641 97.9 F (36.6 C)     Temp Source 07/01/21 1641 Oral     SpO2 07/01/21 1641 95 %     Weight --      Height --      Head Circumference --      Peak Flow --      Pain Score 07/01/21 1644 5     Pain Loc --      Pain Edu? --      Excl. in [place]? --    No data found.  Updated Vital Signs BP (!) 143/77 (BP Location: Right Arm)    Pulse 75    Temp 97.9 F (36.6 C) (Oral)    Resp 16    SpO2 95%   Visual Acuity Right Eye Distance:   Left Eye Distance:   Bilateral Distance:    Right Eye Near:   Left Eye Near:    Bilateral Near:     Physical Exam Vitals and nursing note reviewed.  Constitutional:      Appearance: Normal appearance. She is not ill-appearing.  HENT:     Head: Atraumatic.     Mouth/Throat:     Mouth: Mucous membranes are moist.  Eyes:     Extraocular Movements: Extraocular movements intact.     Conjunctiva/sclera: Conjunctivae normal.  Cardiovascular:     Rate and Rhythm: Normal rate and regular rhythm.     Heart sounds: Normal heart sounds.  Pulmonary:     Effort: Pulmonary effort is normal.     Breath sounds: Normal breath sounds.     Comments: Chest rise symmetric bilaterally.  Lung sounds full and equal bilaterally Musculoskeletal:        General: Tenderness and signs of injury present. No swelling or deformity.     Cervical back: Normal range of motion and neck supple.     Comments: Range of motion at baseline Moderate tenderness to palpation over left lateral ribs diffusely.  No point tenderness, deformities palpable, decreased range of motion  Skin:    General: Skin is warm and dry.     Findings: No bruising.  Neurological:     Mental Status: She is alert and oriented to person, place, and time.  Psychiatric:        Mood and Affect: Mood normal.        Thought Content: Thought content normal.        Judgment:  Judgment normal.     UC Treatments / Results  Labs (all labs ordered are listed, but only abnormal results are displayed) Labs  Reviewed - No data to display  EKG   Radiology DG Ribs Unilateral W/Chest Left  Result Date: 07/01/2021 CLINICAL DATA:  Left rib pain after fall 5 days ago. EXAM: LEFT RIBS AND CHEST - 3+ VIEW COMPARISON:  None. FINDINGS: No fracture or other bone lesions are seen involving the ribs. There is no evidence of pneumothorax or pleural effusion. Both lungs are clear. Heart size and mediastinal contours are within normal limits. IMPRESSION: Negative. Electronically Signed   By: Lupita Raider M.D.   On: 07/01/2021 17:35    Procedures Procedures (including critical care time)  Medications Ordered in UC Medications - No data to display  Initial Impression / Assessment and Plan / UC Course  I have reviewed the triage vital signs and the nursing notes.  Pertinent labs & imaging results that were available during my care of the patient were reviewed by me and considered in my medical decision making (see chart for details).      Left rib x-ray negative for acute bony abnormality, suspect contusion from the fall.  Treat with lidocaine patches, heat, rest.  Return for worsening symptoms.  Final Clinical Impressions(s) / UC Diagnoses   Final diagnoses:  Rib contusion, left, initial encounter  Fall, initial encounter   Discharge Instructions   None    ED Prescriptions     Medication Sig Dispense Auth. Provider   lidocaine (LIDODERM) 5 % Place 1 patch onto the skin daily. Remove & Discard patch within 12 hours or as directed by MD 30 patch Particia Nearing, PA-C      PDMP not reviewed this encounter.   Particia Nearing, [place] 07/01/21 1757

## 2021-07-01 NOTE — Telephone Encounter (Signed)
Pt advised of Dr.Jaffe. Her PCP will not see her until Jan.3 rd. So she will go to the Urgent Care.

## 2021-07-01 NOTE — Telephone Encounter (Signed)
Spoke to the patient she fell on 06/27/21 in her closet. Unsure she bruised her ribs on the left side. Pt can feels the pain when she talks, cough and walks. Pt gave her PCP a call at 9 am this morning with no response.  Pt states she did not want to call the EMS unless she needs to. Hurts worse when she moves.   Advised pt if the pain gets worse please go to the ED.

## 2021-07-10 ENCOUNTER — Telehealth: Payer: Self-pay

## 2021-07-10 NOTE — Telephone Encounter (Signed)
PA needed for Mayzent. Covermeds key [id number]

## 2021-07-11 ENCOUNTER — Telehealth: Payer: Self-pay

## 2021-07-11 NOTE — Telephone Encounter (Signed)
New message   [employer] is reviewing your PA request and will respond within 24 hours for Medicaid or up to 72 hours for non-Medicaid plans, based on the required timeframe determined by state or federal regulations. To check for an update later, open this request from your dashboard.  Pamela Roman (Key: [id number]) Mayzent 1MG  tablets   Form [employer] Electronic PA Form [id number] NCPDP) Created 2 days ago Sent to Plan 14 minutes ago Plan Response 14 minutes ago Submit Clinical Questions 1 minute ago Determination Wait for Determination Please wait for [employer] 2017 to return a determination.

## 2021-07-11 NOTE — Telephone Encounter (Signed)
F/U  Appeal documentation completed send to plan under CoverMyMeds.

## 2021-07-11 NOTE — Telephone Encounter (Signed)
F/u  Message from Plan CaseId:[id number];Status:Denied;Review Type:;Appeal Information: Attention:ATTN: MEDICARE CLINICAL APPEALS [employer] [id number]. [phone] Phone:[phone] Fax:[phone] WebAddress:[email or web address]; Important - Please read the below note on eAppeals: Please reference the denial letter for information on the rights for an appeal, rationale for the denial, and how to submit an appeal including if any information is needed to support the appeal. Note about urgent situations - Generally, an urgent situation is one which, in the opinion of the provider, the health of the patient may be in serious jeopardy or may experience pain that cannot be adequately controlled while waiting for a decision on the appeal.;  Why did we deny your request? We denied this request under Medicare Part D because: The information received from your physician does not support approval of this drug under your Medicare  Part D benefit because the Food and Drug Administration (FDA) has not approved the use of the  requested medication for the diagnosis provided by your prescriber. Your plan's formulary has a  restriction on this drug. This restriction has been approved by the [employer] for Medicare and Medicaid  Services (CMS). Coverage of the requested medication is provided when the FDA has approved use of  the requested medication for the diagnosis provided by your prescriber. The FDA approved use for this  drug is for the treatment of relapsing forms of multiple sclerosis (MS), to include clinically isolated  syndrome, relapsing-remitting disease, and active secondary progressive disease in adults. Mayzent  must be prescribed by a physician who specializes in this treatment to be covered and should not be used  in combination with other disease-modifying agents used for multiple sclerosis. Patients who do not have  active secondary progressive MS and are new to therapy must  try one S1P drug (Gilenya or Zeposia) and  one fumarate product (generic dimethyl fumarate or Vumerity) prior to approval of Mayzent. Coverage is  provided for continuation if the patient has been established on Mayzent or if the patient has active  secondary progressive MS

## 2021-07-11 NOTE — Telephone Encounter (Signed)
F/u   Seward Speck (Key: [id number]) Mayzent 1MG  tablets   Form [employer] Electronic PA Form [phone] NCPDP) Created 2 days ago Sent to Plan 14 minutes ago Plan Response 14 minutes ago Submit Clinical Questions 1 minute ago Determination Wait for Determination Please wait for [employer] 2017 to return a determination.

## 2021-07-16 ENCOUNTER — Other Ambulatory Visit: Payer: Self-pay

## 2021-07-16 ENCOUNTER — Encounter: Payer: Self-pay | Admitting: Internal Medicine

## 2021-07-16 ENCOUNTER — Ambulatory Visit (INDEPENDENT_AMBULATORY_CARE_PROVIDER_SITE_OTHER): Payer: Medicare Other | Admitting: Internal Medicine

## 2021-07-16 VITALS — BP 138/86 | HR 71 | Resp 18 | Ht 63.0 in | Wt 125.1 lb

## 2021-07-16 DIAGNOSIS — Z1231 Encounter for screening mammogram for malignant neoplasm of breast: Secondary | ICD-10-CM

## 2021-07-16 DIAGNOSIS — G35 Multiple sclerosis: Secondary | ICD-10-CM

## 2021-07-16 DIAGNOSIS — Z23 Encounter for immunization: Secondary | ICD-10-CM | POA: Diagnosis not present

## 2021-07-16 DIAGNOSIS — E039 Hypothyroidism, unspecified: Secondary | ICD-10-CM | POA: Diagnosis not present

## 2021-07-16 DIAGNOSIS — D72829 Elevated white blood cell count, unspecified: Secondary | ICD-10-CM

## 2021-07-16 DIAGNOSIS — Z114 Encounter for screening for human immunodeficiency virus [HIV]: Secondary | ICD-10-CM

## 2021-07-16 DIAGNOSIS — Z09 Encounter for follow-up examination after completed treatment for conditions other than malignant neoplasm: Secondary | ICD-10-CM | POA: Diagnosis not present

## 2021-07-16 DIAGNOSIS — Z780 Asymptomatic menopausal state: Secondary | ICD-10-CM

## 2021-07-16 DIAGNOSIS — M810 Age-related osteoporosis without current pathological fracture: Secondary | ICD-10-CM | POA: Diagnosis not present

## 2021-07-16 DIAGNOSIS — R634 Abnormal weight loss: Secondary | ICD-10-CM | POA: Diagnosis not present

## 2021-07-16 DIAGNOSIS — Z1211 Encounter for screening for malignant neoplasm of colon: Secondary | ICD-10-CM

## 2021-07-16 DIAGNOSIS — F321 Major depressive disorder, single episode, moderate: Secondary | ICD-10-CM

## 2021-07-16 DIAGNOSIS — Z1159 Encounter for screening for other viral diseases: Secondary | ICD-10-CM

## 2021-07-16 NOTE — Assessment & Plan Note (Signed)
On Alendronate Vitamin D 50,000 IU once weekly 

## 2021-07-16 NOTE — Addendum Note (Signed)
Addended by: Ishmael Holter R on: 07/16/2021 11:50 AM   Modules accepted: Orders

## 2021-07-16 NOTE — Assessment & Plan Note (Signed)
Urgent care visit after a fall recently, visit note reviewed including imaging No fracture, but has localized bruising from fall Advised to take Tylenol as needed

## 2021-07-16 NOTE — Assessment & Plan Note (Signed)
On Mayzent 1 mg QD Progressive neurologic deficits Has paresthesia in the legs, physical deconditioning leading to wheelchair use Follows up with Neurologist - last visit note reviewed  She has been participating in physical training at [facility] and has been doing better now in terms of ambulation.

## 2021-07-16 NOTE — Assessment & Plan Note (Signed)
Well-controlled On Zoloft 

## 2021-07-16 NOTE — Assessment & Plan Note (Signed)
On Levothyroxine 50 mcg QD Check TSH and free T4 

## 2021-07-16 NOTE — Progress Notes (Signed)
Established Patient Office Visit  Subjective:  Patient ID: Pamela Roman, female    DOB: 11-13-1955  Age: 66 y.o. MRN: [id number]  CC:  Chief Complaint  Patient presents with   Follow-up    4 month follow up pt did fall a few weeks ago went and got xrays done didn't break anything still sore     HPI Pamela Roman is a 66 y.o. female with past medical history of progressive multiple sclerosis, physical deconditioning, osteoporosis, multiple falls, hypothyroidism and depression who presents for f/u of her chronic medical conditions.  She had a fall at home about 2 weeks ago.  She had left lateral rib soreness at that time, but her x-ray of the ribs was negative for any acute fracture.  Her pain has been improving slowly.  She denies any chest pain, dyspnea or palpitations currently.  Weight loss: Her weight is stable now. She takes Ensure supplements.  Hypothyroidism: Takes Levothyroxine regularly.   MS:  She takes Mayzent for MS and follows up with neurologist.  She reports having multiple falls in the last year, last one in 02/2020. She has been doing physical training at [facility] and her ambulation is better now.  She received PCV20 in the office today.  Past Medical History:  Diagnosis Date   Depression    Phreesia 08/06/2020   Hx of migraines    Hyperglycemia    Hypothyroidism    Migraines    Multiple sclerosis ([place])     Past Surgical History:  Procedure Laterality Date   CATARACT EXTRACTION W/PHACO Left 01/01/2020   Procedure: CATARACT EXTRACTION PHACO AND INTRAOCULAR LENS PLACEMENT LEFT EYE;  Surgeon: Baruch Goldmann, MD;  Location: [facility];  Service: Ophthalmology;  Laterality: Left;  CDE: 3.37   CATARACT EXTRACTION W/PHACO Right 01/19/2020   Procedure: CATARACT EXTRACTION PHACO AND INTRAOCULAR LENS PLACEMENT RIGHT EYE;  Surgeon: Baruch Goldmann, MD;  Location: [facility];  Service: Ophthalmology;  Laterality: Right;  CDE: 3.16   TONSILLECTOMY      History reviewed. No  pertinent family history.  Social History   Socioeconomic History   Marital status: Widowed    Spouse name: Not on file   Number of children: Not on file   Years of education: Not on file   Highest education level: Not on file  Occupational History   Not on file  Tobacco Use   Smoking status: Never   Smokeless tobacco: Never  Vaping Use   Vaping Use: Never used  Substance and Sexual Activity   Alcohol use: Not Currently   Drug use: Never   Sexual activity: Not on file  Other Topics Concern   Not on file  Social History Narrative   ** Merged History Encounter **       Lives with her sister two story home stays on the first floor Left handed   Social Determinants of Health   Financial Resource Strain: Low Risk    Difficulty of Paying Living Expenses: Not hard at all  Food Insecurity: No Food Insecurity   Worried About Charity fundraiser in the Last Year: Never true   [place] in the Last Year: Never true  Transportation Needs: No Transportation Needs   Lack of Transportation (Medical): No   Lack of Transportation (Non-Medical): No  Physical Activity: Insufficiently Active   Days of Exercise per Week: 2 days   Minutes of Exercise per Session: 30 min  Stress: No Stress Concern Present   Feeling  of Stress : Not at all  Social Connections: Moderately Integrated   Frequency of Communication with Friends and Family: More than three times a week   Frequency of Social Gatherings with Friends and Family: Three times a week   Attends Religious Services: 1 to 4 times per year   Active Member of Clubs or Organizations: Yes   Attends Archivist Meetings: More than 4 times per year   Marital Status: Widowed  Intimate Partner Violence: Not on file    Outpatient Medications Prior to Visit  Medication Sig Dispense Refill   alendronate (FOSAMAX) 70 MG tablet Take 1 tablet (70 mg total) by mouth once a week. Take with a full glass of water on an empty stomach. 90  tablet 0   diazepam (VALIUM) 5 MG tablet TAKE 1 TABLET BY MOUTH 30 TO 40 MINUTES BEFORE MRI 2 tablet 0   gabapentin (NEURONTIN) 100 MG capsule Take 1 capsule (100 mg total) by mouth 3 (three) times daily. 270 capsule 3   levothyroxine (SYNTHROID) 50 MCG tablet TAKE 1 TABLET DAILY BEFORE BREAKFAST 90 tablet 3   sertraline (ZOLOFT) 25 MG tablet TAKE 1 TABLET DAILY 90 tablet 3   simvastatin (ZOCOR) 20 MG tablet TAKE 1 TABLET DAILY 90 tablet 3   Siponimod Fumarate (MAYZENT) 1 MG TABS Take 1 mg by mouth daily. 90 tablet 1   SUMAtriptan (IMITREX) 100 MG tablet TAKE 1 TABLET EVERY 2 HOURS AS NEEDED FOR MIGRAINE. MAY REPEAT IN 2 HOURS IF HEADACHE PERSISTS OR RECURS 27 tablet 0   Vitamin D, Ergocalciferol, (DRISDOL) 1.25 MG (50000 UNIT) CAPS capsule TAKE 1 CAPSULE EVERY 7 DAYS 5 capsule 9   zonisamide (ZONEGRAN) 100 MG capsule TAKE 1 CAPSULE DAILY 90 capsule 3   lidocaine (LIDODERM) 5 % Place 1 patch onto the skin daily. Remove & Discard patch within 12 hours or as directed by MD 30 patch 0   No facility-administered medications prior to visit.    Allergies  Allergen Reactions   Codeine Nausea And Vomiting    ROS Review of Systems  Constitutional:  Negative for chills and fever.  HENT:  Negative for congestion, sinus pain, sore throat and voice change.   Eyes:  Positive for visual disturbance. Negative for pain.  Respiratory:  Negative for cough and shortness of breath.   Cardiovascular:  Negative for chest pain and palpitations.  Gastrointestinal:  Negative for constipation, diarrhea, nausea and vomiting.  Endocrine: Negative for polydipsia and polyuria.  Genitourinary:  Negative for dysuria and hematuria.  Musculoskeletal:  Negative for neck pain and neck stiffness.  Skin:  Negative for rash.  Neurological:  Positive for weakness and numbness. Negative for dizziness.  Psychiatric/Behavioral:  Negative for agitation and behavioral problems.      Objective:    Physical Exam Vitals  reviewed.  Constitutional:      General: She is not in acute distress.    Appearance: She is not diaphoretic.  HENT:     Head: Normocephalic and atraumatic.     Nose: Nose normal.     Mouth/Throat:     Mouth: Mucous membranes are moist.  Eyes:     General: No scleral icterus.    Extraocular Movements: Extraocular movements intact.  Cardiovascular:     Rate and Rhythm: Normal rate and regular rhythm.     Pulses: Normal pulses.     Heart sounds: Normal heart sounds. No murmur heard. Pulmonary:     Breath sounds: Normal breath sounds. No wheezing or  rales.  Abdominal:     Palpations: Abdomen is soft.     Tenderness: There is no abdominal tenderness.  Musculoskeletal:     Cervical back: Neck supple. No tenderness.     Right lower leg: No edema.     Left lower leg: No edema.  Skin:    General: Skin is warm.     Findings: Bruising (Right leg from fall) present. No rash.  Neurological:     General: No focal deficit present.     Mental Status: She is alert and oriented to person, place, and time.     Sensory: Sensory deficit (B/l LE) present.     Motor: Weakness (B/l UE and LE - 4/5) present.  Psychiatric:        Mood and Affect: Mood normal.        Behavior: Behavior normal.    BP 138/86 (BP Location: Left Arm, Patient Position: Sitting, Cuff Size: Normal)    Pulse 71    Resp 18    Ht 5' 3"  (1.6 m)    Wt 125 lb 1.9 oz (56.8 kg)    SpO2 100%    BMI 22.16 kg/m  Wt Readings from Last 3 Encounters:  07/16/21 125 lb 1.9 oz (56.8 kg)  06/05/21 125 lb (56.7 kg)  03/12/21 124 lb 12.8 oz (56.6 kg)    Lab Results  Component Value Date   TSH 1.470 11/06/2020   Lab Results  Component Value Date   WBC 5.6 06/05/2021   HGB 13.4 06/05/2021   HCT 40.6 06/05/2021   MCV 92.6 06/05/2021   PLT 264.0 06/05/2021   Lab Results  Component Value Date   NA 143 06/05/2021   K 4.4 06/05/2021   CO2 28 06/05/2021   GLUCOSE 103 (H) 06/05/2021   BUN 25 (H) 06/05/2021   CREATININE 0.78  06/05/2021   BILITOT 0.6 06/05/2021   ALKPHOS 101 06/05/2021   AST 21 06/05/2021   ALT 20 06/05/2021   PROT 6.8 06/05/2021   ALBUMIN 4.4 06/05/2021   CALCIUM 9.6 06/05/2021   EGFR 95 10/23/2020   GFR 79.97 06/05/2021   Lab Results  Component Value Date   CHOL 200 (H) 11/06/2020   Lab Results  Component Value Date   HDL 54 11/06/2020   Lab Results  Component Value Date   LDLCALC 123 (H) 11/06/2020   Lab Results  Component Value Date   TRIG 129 11/06/2020   Lab Results  Component Value Date   CHOLHDL 3.7 11/06/2020   No results found for: HGBA1C    Assessment & Plan:   Problem List Items Addressed This Visit       Endocrine   Hypothyroidism - Primary    On Levothyroxine 50 mcg QD Check TSH and free T4      Relevant Orders   Basic Metabolic Panel (BMET)   TSH + free T4     Nervous and Auditory   Multiple sclerosis (HCC)    On Mayzent 1 mg QD Progressive neurologic deficits Has paresthesia in the legs, physical deconditioning leading to wheelchair use Follows up with Neurologist - last visit note reviewed  She has been participating in physical training at [facility] and has been doing better now in terms of ambulation.        Musculoskeletal and Integument   Osteoporosis    On Alendronate Vitamin D 50,000 IU once weekly        Other   Weight loss    Now stable compared  to previous visit Appears to be due to nutritional deficiency Advised to continue Ensure supplement      Depression, major, single episode, moderate ([facility])    Well-controlled On Zoloft      Encounter for examination following treatment at hospital    Urgent care visit after a fall recently, visit note reviewed including imaging No fracture, but has localized bruising from fall Advised to take Tylenol as needed      Other Visit Diagnoses     Screening mammogram for breast cancer       Relevant Orders   MM 3D SCREEN BREAST BILATERAL   Special screening for malignant  neoplasms, colon       Relevant Orders   Cologuard   Post-menopausal       Relevant Orders   DG Bone Density   Encounter for screening for HIV       Relevant Orders   HIV antibody (with reflex)   Need for hepatitis C screening test       Relevant Orders   Hepatitis C Antibody   Leukocytosis, unspecified type       Relevant Orders   CBC with Differential/Platelet       No orders of the defined types were placed in this encounter.   Follow-up: Return in about 6 months (around 01/13/2022).    Lindell Spar, MD

## 2021-07-16 NOTE — Patient Instructions (Signed)
Please continue to take medications as prescribed.  Okay to take Tylenol for back pain.  You were given PCV20 - pneumococcal vaccine in the office today.  Please consider getting Shingrix vaccine at your local pharmacy.

## 2021-07-16 NOTE — Assessment & Plan Note (Signed)
Now stable compared to previous visit Appears to be due to nutritional deficiency Advised to continue Ensure supplement 

## 2021-07-17 LAB — CBC WITH DIFFERENTIAL/PLATELET
Basophils Absolute: 0 10*3/uL (ref 0.0–0.2)
Basos: 0 %
EOS (ABSOLUTE): 0.1 10*3/uL (ref 0.0–0.4)
Eos: 1 %
Hematocrit: 42.8 % (ref 34.0–46.6)
Hemoglobin: 14.2 g/dL (ref 11.1–15.9)
Immature Grans (Abs): 0 10*3/uL (ref 0.0–0.1)
Immature Granulocytes: 0 %
Lymphocytes Absolute: 0.3 10*3/uL — ABNORMAL LOW (ref 0.7–3.1)
Lymphs: 7 %
MCH: 30.5 pg (ref 26.6–33.0)
MCHC: 33.2 g/dL (ref 31.5–35.7)
MCV: 92 fL (ref 79–97)
Monocytes Absolute: 0.6 10*3/uL (ref 0.1–0.9)
Monocytes: 13 %
Neutrophils Absolute: 3.6 10*3/uL (ref 1.4–7.0)
Neutrophils: 79 %
Platelets: 258 10*3/uL (ref 150–450)
RBC: 4.65 x10E6/uL (ref 3.77–5.28)
RDW: 12.1 % (ref 11.7–15.4)
WBC: 4.5 10*3/uL (ref 3.4–10.8)

## 2021-07-17 LAB — BASIC METABOLIC PANEL
BUN/Creatinine Ratio: 29 — ABNORMAL HIGH (ref 12–28)
BUN: 20 mg/dL (ref 8–27)
CO2: 24 mmol/L (ref 20–29)
Calcium: 9.4 mg/dL (ref 8.7–10.3)
Chloride: 107 mmol/L — ABNORMAL HIGH (ref 96–106)
Creatinine, Ser: 0.7 mg/dL (ref 0.57–1.00)
Glucose: 91 mg/dL (ref 70–99)
Potassium: 4.6 mmol/L (ref 3.5–5.2)
Sodium: 143 mmol/L (ref 134–144)
eGFR: 96 mL/min/{1.73_m2} (ref >59)

## 2021-07-17 LAB — TSH+FREE T4
Free T4: 1.34 ng/dL (ref 0.82–1.77)
TSH: 2.23 u[IU]/mL (ref 0.450–4.500)

## 2021-07-17 LAB — HEPATITIS C ANTIBODY: Hep C Virus Ab: <0.1 s/co ratio (ref 0.0–0.9)

## 2021-07-17 LAB — HIV ANTIBODY (ROUTINE TESTING W REFLEX): HIV Screen 4th Generation wRfx: NONREACTIVE

## 2021-07-24 ENCOUNTER — Ambulatory Visit: Payer: Commercial Indemnity | Admitting: Neurology

## 2021-07-24 NOTE — Telephone Encounter (Signed)
Pamela Roman (Key: [id number]) Mayzent 1MG  tablets   Form [employer] Electronic PA Form [phone] NCPDP) Created 15 days ago Sent to Plan 13 days ago Plan Response 13 days ago Submit Clinical Questions 13 days ago Determination Favorable 13 days ago Message from Plan CaseId:[id number];Status:Denied;Review Type:;Appeal Information: Attention:ATTN: MEDICARE CLINICAL APPEALS [employer] [id number]. [phone] Phone:[phone] [phone] WebAddress:[email or web address]; Important - Please read the below note on eAppeals: Please reference the denial letter for information on the rights for an appeal, rationale for the denial, and how to submit an appeal including if any information is needed to support the appeal. Note about urgent situations - Generally, an urgent situation is one which, in the opinion of the provider, the health of the patient may be in serious jeopardy or may experience pain that cannot be adequately controlled while waiting for a decision on the appeal.; CaseId:[id number];Status:Approved;Review Type:Prior Auth;Coverage Start Date:07/06/2021;Coverage End Date:07/14/2022;

## 2021-08-08 LAB — COLOGUARD: COLOGUARD: NEGATIVE

## 2021-08-15 ENCOUNTER — Other Ambulatory Visit: Payer: Self-pay | Admitting: Neurology

## 2021-08-15 DIAGNOSIS — G35 Multiple sclerosis: Secondary | ICD-10-CM

## 2021-08-20 ENCOUNTER — Telehealth: Payer: Self-pay | Admitting: Neurology

## 2021-08-20 DIAGNOSIS — R200 Anesthesia of skin: Secondary | ICD-10-CM

## 2021-08-20 NOTE — Telephone Encounter (Signed)
Pt advised to call the company at  [phone]).  Dr.Jaffe please advise of her second question.

## 2021-08-20 NOTE — Telephone Encounter (Signed)
Pt called in stating she never heard back from the pharmaceutical company for her Mayzent. She was also supposed to get on a patient support and hasn't heard anything about that either.   She also wanted to let Dr. Everlena Cooper know she has had a problem with numbness and tingling in her feet. She says it seems to be affecting her legs more, mostly her calves. Right leg seems to be worse.

## 2021-08-20 NOTE — Telephone Encounter (Signed)
09/23/21 at 12:45 for EMG BLE

## 2021-08-20 NOTE — Telephone Encounter (Signed)
Patient advised of Dr.Jaffe, I do not think it is related to the MS.  She does have evidence of pinched nerves in the back, so it may be that.  It may also be nerve damage in the feet.  To evaluate further, we can order a nerve conduction study in which we test the nerves with electrical shocks to the legs and by sticking a thin needle into the muscles of the legs to evaluate the nerves.  We can check a B12 level to see if it is low, which may cause numbness and tingling.  I do not feel it is related to the MS.          Front desk please call the patient to schedule the EMG BLLE.

## 2021-09-05 ENCOUNTER — Telehealth: Payer: Self-pay | Admitting: Neurology

## 2021-09-05 NOTE — Telephone Encounter (Signed)
Pt called in stating she is still working on getting into the patient assistance program for the [place]. She only has a weeks worth left. ?

## 2021-09-05 NOTE — Telephone Encounter (Signed)
Please let Jasmine December know that it is okay for patient to receive temporary supply of medication. Thanks.  ?

## 2021-09-05 NOTE — Telephone Encounter (Signed)
Per Jasmine December from [place], Patient needs to turn in her tax return so they can finish the process of her application.  ?Patient was told on the 1st that she can get pills from the hub to hold her over until she can get approved for the assistance. Please say temp fill while she waits on the documents she needs.  ? ? ?Per Pt she sent in a statement from [employer] what her income is now. ?Patient is aware to call on Monday to see if she can get a temp fill and to know if she could qualify for assistance.  ?

## 2021-09-23 ENCOUNTER — Ambulatory Visit (INDEPENDENT_AMBULATORY_CARE_PROVIDER_SITE_OTHER): Payer: Medicare Other | Admitting: Neurology

## 2021-09-23 ENCOUNTER — Other Ambulatory Visit: Payer: Self-pay

## 2021-09-23 DIAGNOSIS — R202 Paresthesia of skin: Secondary | ICD-10-CM | POA: Diagnosis not present

## 2021-09-23 DIAGNOSIS — M5417 Radiculopathy, lumbosacral region: Secondary | ICD-10-CM

## 2021-09-23 DIAGNOSIS — R200 Anesthesia of skin: Secondary | ICD-10-CM | POA: Diagnosis not present

## 2021-09-23 NOTE — Procedures (Signed)
[facility]  ?[address], [address] ? [place], [place] [address] ?Tel: [phone] ?Fax:  [phone] ?Test Date:  09/23/2021 ? ?Patient: Pamela Roman DOB: [birth year] Physician: Nita Sickle, DO  ?Sex: Female Height: 5\' 3"  Ref Phys: , D.O.  ?ID#: [id number]   Technician:   ? ?Patient Complaints: ?This is a 66 year old female referred for evaluation of bilateral feet paresthesias. ? ?NCV & EMG Findings: ?Extensive electrodiagnostic testing of the right lower extremity and additional studies of the left shows:  ?Bilateral sural and superficial peroneal sensory responses are within normal limits. ?Bilateral peroneal motor responses at the extensor digitorum brevis showed reduced amplitude, and is normal at the tibialis anterior.  Bilateral tibial motor responses are within normal limits. ?Bilateral tibial H reflex studies are within normal limits. ?Chronic motor axonal loss changes are seen affecting the rectus femoris and abductor longus muscles, without accompanying active denervation. ? 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 ?There is no evidence of a large fiber sensorimotor polyneuropathy affecting the lower extremities. ? ? ?___________________________ ?76, DO ? ? ? ?Nerve Conduction Studies ?Anti Sensory Summary Table ? ? Stim Site NR Peak (ms) Norm Peak (ms) P-T Amp (?V) Norm P-T Amp  ?Left Sup Peroneal Anti Sensory (Ant Lat Mall)  32?C  ?12 cm    2.5 <4.6 6.4 >3  ?Right Sup Peroneal Anti Sensory (Ant Lat Mall)  32?C  ?12 cm    2.6 <4.6 6.3 >3  ?Left Sural Anti Sensory (Lat Mall)  32?C  ?Calf    3.0 <4.6 10.8 >3  ?Right Sural Anti Sensory (Lat Mall)  32?C  ?Calf    3.2 <4.6 12.4 >3  ? ?Motor Summary Table ? ? Stim Site NR Onset (ms) Norm Onset (ms) O-P Amp (mV) Norm O-P Amp Site1 Site2 Delta-0 (ms) Dist (cm) Vel (m/s) Norm Vel (m/s)  ?Left Peroneal Motor (Ext Dig Brev)  32?C  ?Ankle    5.3 <6.0 1.8 >2.5 B Fib Ankle 7.4 34.0  46 >40  ?B Fib    12.7  1.5  Poplt B Fib 1.4 7.0 50 >40  ?Poplt    14.1  1.4         ?Right Peroneal Motor (Ext Dig Brev)  32?C  ?Ankle    5.2 <6.0 1.5 >2.5 B Fib Ankle 7.7 34.0 44 >40  ?B Fib    12.9  1.5  Poplt B Fib 1.6 8.0 50 >40  ?Poplt    14.5  1.4         ?Left Peroneal TA Motor (Tib Ant)  32?C  ?Fib Head    2.5 <4.5 5.8 >3 Poplit Fib Head 1.2 7.0 58 >40  ?Poplit    3.7  5.7         ?Right Peroneal TA Motor (Tib Ant)  32?C  ?Fib Head    2.6 <4.5 5.2 >3 Poplit Fib Head 1.4 7.0 50 >40  ?Poplit    4.0  5.1         ?Left Tibial Motor (Abd Nita Sickle Brev)  32?C  ?Ankle    3.5 <6.0 14.9 >4 Knee Ankle 8.0 38.0 48 >40  ?Knee    11.5  11.2         ?Right Tibial Motor (Abd Margo Aye Brev)  32?C  ?Ankle    5.2 <6.0 15.9 >4 Knee Ankle 8.4 39.0 46 >40  ?Knee    13.6  10.7         ? ?H  Reflex Studies ? ? NR H-Lat (ms) Lat Norm (ms) L-R H-Lat (ms)  ?Left Tibial (Gastroc)  32?C  ?   29.93 <35 0.00  ?Right Tibial (Gastroc)  32?C  ?   29.93 <35 0.00  ? ?EMG ? ? Side Muscle Ins Act Fibs Psw Fasc Number Recrt Dur Dur. Amp Amp. Poly Poly. Comment  ?Left AntTibialis Nml Nml Nml Nml Nml Nml Nml Nml Nml Nml Nml Nml N/A  ?Left Gastroc Nml Nml Nml Nml Nml Nml Nml Nml Nml Nml Nml Nml N/A  ?Left RectFemoris Nml Nml Nml Nml 1- Rapid Many 1+ Many 1+ Some 1+ N/A  ?Left GluteusMed Nml Nml Nml Nml Nml Nml Nml Nml Nml Nml Nml Nml N/A  ?Left AdductorLong Nml Nml Nml Nml 1- Rapid Some 1+ Some 1+ Some 1+ N/A  ?Right AdductorLong Nml Nml Nml Nml 1- Rapid Some 1+ Some 1+ Some 1+ N/A  ?Right AntTibialis Nml Nml Nml Nml Nml Nml Nml Nml Nml Nml Nml Nml N/A  ?Right Gastroc Nml Nml Nml Nml Nml Nml Nml Nml Nml Nml Nml Nml N/A  ?Right Flex Dig Long Nml Nml Nml Nml Nml Nml Nml Nml Nml Nml Nml Nml N/A  ?Right RectFemoris Nml Nml Nml Nml 1- Rapid Many 1+ Many 1+ Many 1+ N/A  ?Right GluteusMed Nml Nml Nml Nml Nml Nml Nml Nml Nml Nml Nml Nml N/A  ? ? ? ? ?Waveforms: ?    ? ?    ? ?    ? ?    ? ? ?

## 2021-09-25 ENCOUNTER — Telehealth: Payer: Self-pay

## 2021-09-25 NOTE — Telephone Encounter (Signed)
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  If numbness in legs are painful or causing considerable discomfort, we can increase gabapentin to 200mg  three times daily. ?----- Message ----- ?From: Alda Berthold, DO ?Sent: 09/23/2021   1:21 PM EDT ?To: Pieter Partridge, DO ? ? ?

## 2021-09-25 NOTE — Telephone Encounter (Signed)
Pt called to go over EMG results no answer left a voice mail to call the office back  ?

## 2021-09-29 ENCOUNTER — Telehealth: Payer: Self-pay | Admitting: Neurology

## 2021-09-29 NOTE — Telephone Encounter (Signed)
Patient said since she has been taking gabapentin her legs have become weak. She is not in pain, but she has had some falling episodes. She is going to stop taking the gabapentin and wants jaffe to know ?

## 2021-09-29 NOTE — Telephone Encounter (Signed)
Per Patient she had doubled the Gabapentin as advised on Friday. ? ?Since then her legs are weak,and fatigued. So patient went back to her previous dose. ?Pt missed her Pt appt due to the weakness.  ? ?Please advise ?

## 2021-09-30 NOTE — Telephone Encounter (Signed)
Per Pt she getting a personal trainer who is studying to be Physical therapist. ? ?

## 2021-10-09 NOTE — Telephone Encounter (Signed)
Telephone call from Patient, Patient wanted to let Dr.Jaffe know that she would like to stop the Gabapentin.  ?Patient feels that it is the cause her leg weakness. She not having any pain. Per Patient by the end of the day she is unable to get her feet or legs to want to move, She having trouble lifting them up. The fatigue is so bad. Sometimes she feels like she do not want to go to her PT sessions.  ?

## 2021-11-11 ENCOUNTER — Telehealth: Payer: Self-pay | Admitting: Neurology

## 2021-11-11 DIAGNOSIS — G35 Multiple sclerosis: Secondary | ICD-10-CM

## 2021-11-11 MED ORDER — MAYZENT 1 MG PO TABS: ORAL_TABLET | ORAL | 0 refills | Status: DC

## 2021-11-11 NOTE — Telephone Encounter (Signed)
1. Which medications need refilled? (List name and dosage, if known) mayzent ? ?2. Which pharmacy/location is medication to be sent to? (include street and city if local pharmacy) She says she gets it through the [facility] ? ? ?

## 2021-11-17 ENCOUNTER — Telehealth: Payer: Self-pay | Admitting: Neurology

## 2021-11-17 ENCOUNTER — Other Ambulatory Visit (HOSPITAL_COMMUNITY): Payer: Self-pay

## 2021-11-17 DIAGNOSIS — G35 Multiple sclerosis: Secondary | ICD-10-CM

## 2021-11-17 MED ORDER — MAYZENT 1 MG PO TABS: ORAL_TABLET | ORAL | 0 refills | Status: DC

## 2021-11-17 NOTE — Telephone Encounter (Signed)
Refill sent to [facility].

## 2021-11-17 NOTE — Telephone Encounter (Signed)
Patient called and stated she just needs a new prescription # called in for [place].  The pharmacy # is [phone]. ?

## 2021-11-19 ENCOUNTER — Ambulatory Visit: Payer: Medicare Other

## 2021-11-20 ENCOUNTER — Telehealth: Payer: Self-pay | Admitting: Neurology

## 2021-11-20 DIAGNOSIS — G35 Multiple sclerosis: Secondary | ICD-10-CM

## 2021-11-20 MED ORDER — MAYZENT 1 MG PO TABS: ORAL_TABLET | ORAL | 0 refills | Status: DC

## 2021-11-20 NOTE — Telephone Encounter (Signed)
Pt needs prescription called into [employer]' Patient Assistance Program. She has a couple of weeks of medication left.

## 2021-11-20 NOTE — Telephone Encounter (Signed)
Called [employer] to call in a refill for Mayzent [phone].  Per rep please send it electronically to [facility].  Refill sent to last until her follow up in July.

## 2021-11-24 ENCOUNTER — Ambulatory Visit (INDEPENDENT_AMBULATORY_CARE_PROVIDER_SITE_OTHER): Payer: Medicare Other

## 2021-11-24 DIAGNOSIS — Z0000 Encounter for general adult medical examination without abnormal findings: Secondary | ICD-10-CM

## 2021-11-24 NOTE — Patient Instructions (Addendum)
  Pamela Roman , Thank you for taking time to come for your Medicare Wellness Visit. I appreciate your ongoing commitment to your health goals. Please review the following plan we discussed and let me know if I can assist you in the future.   Mammogram an bone density scheduled for June 26 9:45 am. Stop any calcium supplement 2 days prior to appt and no lotion powder or deodorant for the mammogram.    These are the goals we discussed:  Goals      DIET - INCREASE WATER INTAKE     Have 3 meals a day     Patient Stated     Staying active with personal trainer and the senior center      Prevent falls        This is a list of the screening recommended for you and due dates:  Health Maintenance  Topic Date Due   Tetanus Vaccine  Never done   Pap Smear  Never done   Zoster (Shingles) Vaccine (1 of 2) Never done   COVID-19 Vaccine (4 - Booster for Moderna series) 06/26/2020   DEXA scan (bone density measurement)  Never done   Flu Shot  02/03/2022   Mammogram  08/28/2022   Cologuard (Stool DNA test)  08/01/2024   Pneumonia Vaccine  Completed   Hepatitis C Screening: USPSTF Recommendation to screen - Ages 20-79 yo.  Completed   HIV Screening  Completed   HPV Vaccine  Aged Out

## 2021-11-24 NOTE — Progress Notes (Signed)
I connected with  Pamela Roman on 11/24/21 by a audio enabled telemedicine application and verified that I am speaking with the correct person using two identifiers.  Patient Location: Home  Provider Location: Office/Clinic  I discussed the limitations of evaluation and management by telemedicine. The patient expressed understanding and agreed to proceed.  Subjective:   Pamela Roman is a 66 y.o. female who presents for Medicare Annual (Subsequent) preventive examination.  Review of Systems     Cardiac Risk Factors include: dyslipidemia     Objective:    There were no vitals filed for this visit. There is no height or weight on file to calculate BMI.     06/05/2021   10:05 AM 10/29/2020    9:07 AM 03/22/2020    2:29 PM 01/19/2020    7:50 AM 01/01/2020   11:13 AM 11/13/2019   11:17 AM  Advanced Directives  Does Patient Have a Medical Advance Directive? No No No No No No  Would patient like information on creating a medical advance directive?    No - Patient declined No - Patient declined     Current Medications (verified) Outpatient Encounter Medications as of 11/24/2021  Medication Sig   alendronate (FOSAMAX) 70 MG tablet Take 1 tablet (70 mg total) by mouth once a week. Take with a full glass of water on an empty stomach.   diazepam (VALIUM) 5 MG tablet TAKE 1 TABLET BY MOUTH 30 TO 40 MINUTES BEFORE MRI   gabapentin (NEURONTIN) 100 MG capsule Take 1 capsule (100 mg total) by mouth 3 (three) times daily.   levothyroxine (SYNTHROID) 50 MCG tablet TAKE 1 TABLET DAILY BEFORE BREAKFAST   sertraline (ZOLOFT) 25 MG tablet TAKE 1 TABLET DAILY   simvastatin (ZOCOR) 20 MG tablet TAKE 1 TABLET DAILY   Siponimod Fumarate (MAYZENT) 1 MG TABS TAKE 1 TABLET (1 MG) DAILY   SUMAtriptan (IMITREX) 100 MG tablet TAKE 1 TABLET EVERY 2 HOURS AS NEEDED FOR MIGRAINE. MAY REPEAT IN 2 HOURS IF HEADACHE PERSISTS OR RECURS   Vitamin D, Ergocalciferol, (DRISDOL) 1.25 MG (50000 UNIT) CAPS capsule TAKE 1  CAPSULE EVERY 7 DAYS   zonisamide (ZONEGRAN) 100 MG capsule TAKE 1 CAPSULE DAILY   No facility-administered encounter medications on file as of 11/24/2021.    Allergies (verified) Codeine   History: Past Medical History:  Diagnosis Date   Depression    Phreesia 08/06/2020   Hx of migraines    Hyperglycemia    Hypothyroidism    Migraines    Multiple sclerosis (HCC)    Past Surgical History:  Procedure Laterality Date   CATARACT EXTRACTION W/PHACO Left 01/01/2020   Procedure: CATARACT EXTRACTION PHACO AND INTRAOCULAR LENS PLACEMENT LEFT EYE;  Surgeon: Fabio Pierce, MD;  Location: [facility];  Service: Ophthalmology;  Laterality: Left;  CDE: 3.37   CATARACT EXTRACTION W/PHACO Right 01/19/2020   Procedure: CATARACT EXTRACTION PHACO AND INTRAOCULAR LENS PLACEMENT RIGHT EYE;  Surgeon: Fabio Pierce, MD;  Location: [facility];  Service: Ophthalmology;  Laterality: Right;  CDE: 3.16   TONSILLECTOMY     No family history on file. Social History   Socioeconomic History   Marital status: Widowed    Spouse name: Not on file   Number of children: Not on file   Years of education: Not on file   Highest education level: Not on file  Occupational History   Not on file  Tobacco Use   Smoking status: Never   Smokeless tobacco: Never  Tobacco comments:    Lived with smoker  Vaping Use   Vaping Use: Never used  Substance and Sexual Activity   Alcohol use: Not Currently   Drug use: Never   Sexual activity: Not Currently  Other Topics Concern   Not on file  Social History Narrative   ** Merged History Encounter **       Lives with her sister two story home stays on the first floor Left handed   Social Determinants of Health   Financial Resource Strain: Low Risk    Difficulty of Paying Living Expenses: Not hard at all  Food Insecurity: No Food Insecurity   Worried About Programme researcher, broadcasting/film/video in the Last Year: Never true   Ran Out of Food in the Last Year: Never true  Transportation  Needs: No Transportation Needs   Lack of Transportation (Medical): No   Lack of Transportation (Non-Medical): No  Physical Activity: Insufficiently Active   Days of Exercise per Week: 4 days   Minutes of Exercise per Session: 30 min  Stress: Not on file  Social Connections: Moderately Integrated   Frequency of Communication with Friends and Family: More than three times a week   Frequency of Social Gatherings with Friends and Family: More than three times a week   Attends Religious Services: More than 4 times per year   Active Member of [employer] or Organizations: Yes   Attends Banker Meetings: 1 to 4 times per year   Marital Status: Widowed    Tobacco Counseling Counseling given: Not Answered Tobacco comments: Lived with smoker   Clinical Intake:  Pre-visit preparation completed: Yes  Pain : No/denies pain     Nutritional Status: BMI of 19-24  Normal Diabetes: No  How often do you need to have someone help you when you read instructions, pamphlets, or other written materials from your doctor or pharmacy?: 1 - Never What is the last grade level you completed in school?: graduate school  Diabetic? na  Interpreter Needed?: No      Activities of Daily Living    11/24/2021    8:06 AM 11/21/2021   10:30 AM  In your present state of health, do you have any difficulty performing the following activities:  Hearing? 1 0  Vision? 0 1  Difficulty concentrating or making decisions? 0 1  Walking or climbing stairs? 1 1  Dressing or bathing? 0 0  Doing errands, shopping? 1 1  Preparing Food and eating ? N N  Using the Toilet? N N  In the past six months, have you accidently leaked urine? N Y  Do you have problems with loss of bowel control? N Y  Managing your Medications? N N  Managing your Finances? N N  Housekeeping or managing your Housekeeping? Pamela Roman    Patient Care Team: Anabel Halon, MD as PCP - General (Internal Medicine) Patient, No Pcp Per  (Inactive) (General Practice)  Indicate any recent Medical Services you may have received from other than [facility] providers in the past year (date may be approximate).     Assessment:   This is a routine wellness examination for Pamela Roman.  Hearing/Vision screen No results found.  Dietary issues and exercise activities discussed: Current Exercise Habits: Structured exercise class, Time (Minutes): 30, Frequency (Times/Week): 4, Weekly Exercise (Minutes/Week): 120, Intensity: Mild, Exercise limited by: neurologic condition(s)   Goals Addressed             This Visit's Progress  DIET - INCREASE WATER INTAKE   On track    Have 3 meals a day   On track    Patient Stated       Staying active with personal trainer and the senior center      Prevent falls   Not on track      Depression Screen    07/16/2021   10:03 AM 03/12/2021    9:45 AM 11/06/2020    9:11 AM 08/09/2020    9:20 AM  PHQ 2/9 Scores  PHQ - 2 Score 0 0 0 0  PHQ- 9 Score 0 0 0     Fall Risk    11/24/2021    8:04 AM 11/21/2021   10:30 AM 11/20/2021    8:40 AM 11/15/2021   10:19 AM 07/16/2021   10:03 AM  Fall Risk   Falls in the past year? Number falls in past yr: Injury with Fall? Risk for fall due to :     History of fall(s);Impaired balance/gait;Impaired mobility  Follow up     Falls evaluation completed;Falls prevention discussed    FALL RISK PREVENTION PERTAINING TO THE HOME:  Any stairs in or around the home? Yes  If so, are there any without handrails? No  Home free of loose throw rugs in walkways, pet beds, electrical cords, etc? Yes  Adequate lighting in your home to reduce risk of falls? Yes   ASSISTIVE DEVICES UTILIZED TO PREVENT FALLS:  Life alert? No  Use of a cane, walker or w/c? Yes  walker Grab bars in the bathroom? No  Shower chair or bench in shower? No  Elevated toilet seat or a handicapped toilet? No    Cognitive Function:    03/12/2021    10:46 AM  MMSE - Mini Mental State Exam  Orientation to time 5  Orientation to Place 5  Registration 3  Attention/ Calculation 5  Recall 3  Language- name 2 objects 2  Language- repeat 1  Language- follow 3 step command 3  Language- read & follow direction 1  Write a sentence 1  Copy design 1  Total score 30        11/24/2021    8:10 AM 11/18/2020    9:49 AM  6CIT Screen  What Year? 0 points 0 points  What month? 0 points 0 points  What time? 0 points 0 points  Count back from 20 0 points 0 points  Months in reverse 0 points 0 points  Repeat phrase 0 points 0 points  Total Score 0 points 0 points    Immunizations Immunization History  Administered Date(s) Administered   Moderna Sars-Covid-2 Vaccination 09/19/2019, 10/17/2019, 05/01/2020   PNEUMOCOCCAL CONJUGATE-20 07/16/2021    TDAP status: Due, Education has been provided regarding the importance of this vaccine. Advised may receive this vaccine at local pharmacy or Health Dept. Aware to provide a copy of the vaccination record if obtained from local pharmacy or Health Dept. Verbalized acceptance and understanding.  Flu Vaccine status: Up to date  Pneumococcal vaccine status: Up to date  Covid-19 vaccine status: Completed vaccines  Qualifies for Shingles Vaccine? Yes   Zostavax completed No   Shingrix Completed?: No.    Education has been provided regarding the importance of this vaccine. Patient has been advised to call insurance company to determine out of pocket  expense if they have not yet received this vaccine. Advised may also receive vaccine at local pharmacy or Health Dept. Verbalized acceptance and understanding.  Screening Tests Health Maintenance  Topic Date Due   TETANUS/TDAP  Never done   PAP SMEAR-Modifier  Never done   Zoster Vaccines- Shingrix (1 of 2) Never done   COVID-19 Vaccine (4 - Booster for Moderna series) 06/26/2020   DEXA SCAN  Never done   INFLUENZA VACCINE  02/03/2022   MAMMOGRAM   08/28/2022   Fecal DNA (Cologuard)  08/01/2024   Pneumonia Vaccine 61+ Years old  Completed   Hepatitis C Screening  Completed   HIV Screening  Completed   HPV VACCINES  Aged Out    Health Maintenance  Health Maintenance Due  Topic Date Due   TETANUS/TDAP  Never done   PAP SMEAR-Modifier  Never done   Zoster Vaccines- Shingrix (1 of 2) Never done   COVID-19 Vaccine (4 - Booster for Moderna series) 06/26/2020   DEXA SCAN  Never done    Colorectal cancer screening: Type of screening: Cologuard. Completed 2023. Repeat every 3 years  Mammogram status: Ordered yes. Pt provided with contact info and advised to call to schedule appt.   Bone Density status: Ordered yes. Pt provided with contact info and advised to call to schedule appt.  Lung Cancer Screening: (Low Dose CT Chest recommended if Age 22-80 years, 30 pack-year currently smoking OR have quit w/in 15years.) does not qualify.   Lung Cancer Screening Referral: na  Additional Screening:  Hepatitis C Screening: does qualify; Completed yes  Vision Screening: Recommended annual ophthalmology exams for early detection of glaucoma and other disorders of the eye. Is the patient up to date with their annual eye exam?  Yes  Who is the provider or what is the name of the office in which the patient attends annual eye exams? [facility]  If pt is not established with a provider, would they like to be referred to a provider to establish care? No .   Dental Screening: Recommended annual dental exams for proper oral hygiene  Community Resource Referral / Chronic Care Management: CRR required this visit?  No   CCM required this visit?  No      Plan:     I have personally reviewed and noted the following in the patient's chart:   Medical and social history Use of alcohol, tobacco or illicit drugs  Current medications and supplements including opioid prescriptions.  Functional ability and status Nutritional  status Physical activity Advanced directives List of other physicians Hospitalizations, surgeries, and ER visits in previous 12 months Vitals Screenings to include cognitive, depression, and falls Referrals and appointments  In addition, I have reviewed and discussed with patient certain preventive protocols, quality metrics, and best practice recommendations. A written personalized care plan for preventive services as well as general preventive health recommendations were provided to patient.     Abner Greenspan, LPN   [id number]/52/[id number]   Nurse Notes:  Ms. Tuley , Thank you for taking time to come for your Medicare Wellness Visit. I appreciate your ongoing commitment to your health goals. Please review the following plan we discussed and let me know if I can assist you in the future.   These are the goals we discussed:  Goals      DIET - INCREASE WATER INTAKE     Have 3 meals a day     Patient Stated     Staying active  with personal trainer and the senior center      Prevent falls        This is a list of the screening recommended for you and due dates:  Health Maintenance  Topic Date Due   Tetanus Vaccine  Never done   Pap Smear  Never done   Zoster (Shingles) Vaccine (1 of 2) Never done   COVID-19 Vaccine (4 - Booster for Moderna series) 06/26/2020   DEXA scan (bone density measurement)  Never done   Flu Shot  02/03/2022   Mammogram  08/28/2022   Cologuard (Stool DNA test)  08/01/2024   Pneumonia Vaccine  Completed   Hepatitis C Screening: USPSTF Recommendation to screen - Ages 29-66 yo.  Completed   HIV Screening  Completed   HPV Vaccine  Aged Out

## 2021-12-02 ENCOUNTER — Telehealth: Payer: Self-pay | Admitting: Neurology

## 2021-12-02 NOTE — Telephone Encounter (Signed)
Patient to to get Viatmin D OTC.

## 2021-12-02 NOTE — Telephone Encounter (Signed)
Patient called and left a message that she found the vitamin D she needed over the counter. FYI only.

## 2021-12-02 NOTE — Telephone Encounter (Signed)
Patient called and stated that she is almost out of Vitamin D.  She states that insurance will not cover it.  She doesn't know what she should do.

## 2021-12-02 NOTE — Telephone Encounter (Signed)
Noted  

## 2021-12-03 ENCOUNTER — Telehealth: Payer: Self-pay | Admitting: Neurology

## 2021-12-03 NOTE — Telephone Encounter (Signed)
Script sent to wrong pharmacy. Correct pharmacy [facility].

## 2021-12-03 NOTE — Telephone Encounter (Signed)
The following message was left with [facility] on 12/03/21 at 1:28 PM.   Tammy from [facility] called to get clarification on Mayzent for the patient. Will the patient require titration first?  Reference #: [id number]

## 2021-12-04 ENCOUNTER — Telehealth: Payer: Self-pay | Admitting: Neurology

## 2021-12-04 NOTE — Telephone Encounter (Signed)
Spoke with [facility] they stated pt called an wanted her medication back there they are asking is she needs a loading dose?

## 2021-12-04 NOTE — Telephone Encounter (Signed)
Angelina from [facility] pharmacy called and LM. They have some questions regarding a medicine ( think she said mayzent? )   Ref number is [id number]

## 2021-12-05 NOTE — Telephone Encounter (Signed)
Spoke to patient, Patient is all set. She wants to keep it [place].

## 2021-12-08 ENCOUNTER — Telehealth: Payer: Self-pay

## 2021-12-08 ENCOUNTER — Other Ambulatory Visit: Payer: Self-pay | Admitting: Neurology

## 2021-12-08 NOTE — Telephone Encounter (Signed)
2nd attempt to cancel Mayzent with [facility].

## 2021-12-09 NOTE — Telephone Encounter (Signed)
Enough given until follow up July 12,2023

## 2021-12-10 ENCOUNTER — Other Ambulatory Visit: Payer: Self-pay | Admitting: Neurology

## 2021-12-18 ENCOUNTER — Telehealth: Payer: Self-pay | Admitting: Neurology

## 2021-12-18 ENCOUNTER — Other Ambulatory Visit: Payer: Self-pay

## 2021-12-18 DIAGNOSIS — G35 Multiple sclerosis: Secondary | ICD-10-CM

## 2021-12-18 NOTE — Telephone Encounter (Signed)
Spoke to Dr. Everlena Cooper who agreed he wants those done befrore the appt. Called insurance no PA needed called [place] got patietn scheduled called [facility] and

## 2021-12-18 NOTE — Telephone Encounter (Signed)
Patient called and asked if she is supposed to have an MRI before her next appointment that's in July? If so she would like it to be done in [place].

## 2021-12-23 ENCOUNTER — Other Ambulatory Visit: Payer: Self-pay | Admitting: Neurology

## 2021-12-25 ENCOUNTER — Telehealth: Payer: Self-pay | Admitting: Neurology

## 2021-12-25 NOTE — Telephone Encounter (Signed)
Pt called in and states she is supposed to have 2 MRI's on 12/31/21. She stated Dr. Everlena Cooper usually prescribes diazepam for her when she has to have these done and would like a prescription called in to [facility] [address] in [place]

## 2021-12-26 ENCOUNTER — Other Ambulatory Visit: Payer: Self-pay | Admitting: Neurology

## 2021-12-26 MED ORDER — DIAZEPAM 5 MG PO TABS
ORAL_TABLET | ORAL | 0 refills | Status: DC
Start: 2021-12-26 — End: 2022-03-31

## 2021-12-29 ENCOUNTER — Ambulatory Visit (HOSPITAL_COMMUNITY)
Admission: RE | Admit: 2021-12-29 | Discharge: 2021-12-29 | Disposition: A | Discharge status: Home / Self Care | Payer: Medicare Other | Source: Ambulatory Visit | Attending: Internal Medicine | Admitting: Internal Medicine

## 2021-12-29 DIAGNOSIS — Z780 Asymptomatic menopausal state: Secondary | ICD-10-CM | POA: Insufficient documentation

## 2021-12-29 DIAGNOSIS — Z1231 Encounter for screening mammogram for malignant neoplasm of breast: Secondary | ICD-10-CM | POA: Diagnosis present

## 2021-12-31 ENCOUNTER — Ambulatory Visit (HOSPITAL_COMMUNITY)
Admission: RE | Admit: 2021-12-31 | Discharge: 2021-12-31 | Disposition: A | Discharge status: Home / Self Care | Payer: Medicare Other | Source: Ambulatory Visit | Attending: Neurology | Admitting: Neurology

## 2021-12-31 DIAGNOSIS — G35 Multiple sclerosis: Secondary | ICD-10-CM | POA: Diagnosis present

## 2021-12-31 MED ORDER — GADOBUTROL 1 MMOL/ML IV SOLN
7.0000 mL | Freq: Once | INTRAVENOUS | Status: AC | PRN
  Administered 2021-12-31: 7 mL via INTRAVENOUS

## 2022-01-12 ENCOUNTER — Ambulatory Visit: Payer: Commercial Indemnity | Admitting: Neurology

## 2022-01-12 ENCOUNTER — Telehealth: Payer: Self-pay | Admitting: Neurology

## 2022-01-12 NOTE — Telephone Encounter (Signed)
Called Pamela Roman back with Dr. Everlena Cooper recommendations and reminded her she does have an appointment in three days were she can speak to Dr. Everlena Cooper about any options

## 2022-01-12 NOTE — Telephone Encounter (Signed)
Patient would like to know if she can get back on steroids to help her walk etc.

## 2022-01-13 ENCOUNTER — Ambulatory Visit (INDEPENDENT_AMBULATORY_CARE_PROVIDER_SITE_OTHER): Payer: Medicare Other | Admitting: Internal Medicine

## 2022-01-13 ENCOUNTER — Encounter: Payer: Self-pay | Admitting: Internal Medicine

## 2022-01-13 VITALS — BP 128/80 | HR 70 | Resp 18 | Ht 63.0 in | Wt 128.8 lb

## 2022-01-13 DIAGNOSIS — E039 Hypothyroidism, unspecified: Secondary | ICD-10-CM

## 2022-01-13 DIAGNOSIS — R634 Abnormal weight loss: Secondary | ICD-10-CM

## 2022-01-13 DIAGNOSIS — G43809 Other migraine, not intractable, without status migrainosus: Secondary | ICD-10-CM

## 2022-01-13 DIAGNOSIS — F321 Major depressive disorder, single episode, moderate: Secondary | ICD-10-CM

## 2022-01-13 DIAGNOSIS — M810 Age-related osteoporosis without current pathological fracture: Secondary | ICD-10-CM

## 2022-01-13 DIAGNOSIS — S22080S Wedge compression fracture of T11-T12 vertebra, sequela: Secondary | ICD-10-CM | POA: Diagnosis not present

## 2022-01-13 DIAGNOSIS — G35 Multiple sclerosis: Secondary | ICD-10-CM

## 2022-01-13 NOTE — Progress Notes (Unsigned)
NEUROLOGY FOLLOW UP OFFICE NOTE  Pamela Roman [id number]  Assessment/Plan:   1.  Multiple sclerosis 2.  Migraine without aura 3.  Pseudoexacerbation related to heat and overexertion  4.  Bowel and bladder dysfunction - no changes on MRI of brain and spinal cord to explain sudden incontinence.   1.  DMT:  Mayzent 1mg  daily 2.  Migraine prevention:  zonisamide 100mg  QD 3.  Migraine rescue:  Sumatriptan 100mg  4.  Neuropathic pain:  Gabapentin 100mg  TID 5.  D3 50,000 IU every 7 days.   6.  Repeat CBC with diff, CMP and vitamin D in 6 months 7   Would advise to follow up with PCP to first evaluate for other causes of incontinence 8   Follow up in 6 months (after repeat labs and MRI)   Subjective:  Pamela Roman is a 66 year old left-handed white female who follows up for multiple sclerosis.  She is accompanied by her sister who supplements history.   UPDATE: Current DMT:  Mayzent 1mg  daily (since July 2021). Other current medications:  Sertraline 25mg  daily; sumatriptan 100mg  PRN; D 50000 IU weekly; levothyroxine; Aleve BID for pain, gabapentin 100mg  TID; zonisamide 100mg  QD  Sometimes can't get to the personal trainer - works on upper body at least     Endorsed worsening weakness and balance. MRIs personally reviewed: 12/31/2021 MRI BRAIN W WO:  1. Findings consistent with widespread chronic demyelinating disease/multiple sclerosis. Overall, disease burden is not significantly changed from previous, with no definite new lesions. No evidence for active demyelination on today's exam. 2. Underlying mildly advanced cerebral atrophy for age. 12/31/2021 MRI C-SPINE W WO:  1. No significant interval change in extensive patchy signal abnormality throughout the cervical spinal cord, consistent with history of chronic multiple sclerosis. No new lesions or significant disease progression. No evidence for active demyelination.  2. New mild chronic compression deformity at the superior endplate of  T3.  3. Moderate spondylosis at C5-6 and C6-7 without significant stenosis. 12/31/2021 MRI T-SPINE W WO:  1. Diffuse patchy and hazy signal abnormality involving the majority of the thoracic cord, most pronounced at the level of T6-7. In comparison with prior exam, these changes are likely similar, although prior exam is degraded by motion, consistent with history of chronic demyelinating disease/multiple sclerosis. Overall, appearance is similar to prior, without evidence for significant disease progression. No definite new lesions. No active demyelination. 2. New chronic compression deformities involving the T3, T5, and T6 vertebral bodies. Additional chronic compression fractures at T11 through L2 are stable.   Vision:  If she looks at distance, she sees horizontal double vision.  Trouble writing and reading.  She had cataract surgery in 2021 and it didn't improve.  Likely related to INO. Motor: Felt weakness in legs may be due to gabapentin, so she discontinued gabapentin.  Continues to feel weak in legs with trouble walking.  Recently went on a trip to [place] 2021 to see family.  Worse during and after the trip. Weather has been hot Sensory:  Numbness and tingling in her calves and feet.  NCV-EMG of lower extremities on 09/23/2021 showed bilateral chronic L3-4 radiculopathy  Pain:  Low back pain.  Left hip pain.  Bilateral sharp stabbing pain in bilateral thighs.  She has degenerative disc disease of lumbar spine. Treats pain with Aleve.  She reports increased lumbar spinal pain recently and notes a protrusion as well.  At first she thought it was related to weight loss exposing her spine but  is not sure.  She is concerned.  She does have history of vertebral fractures.  MRI Lumbar spine on 11/15/2020 showed multilevel spondylosis with moderate spinal canal stenosis and severe bilateral lateral recess stenosis at L4-L5 as well as chronic compression deformities of T11, L1, and L2, including severe right  foraminal stenosis at L2-3.  Advised that if she has been experiencing lower extremity pain and weakness, then would refer to spine specialist.  She denies lower extremity pain however (mostly just localized low back pain) Headaches: Severe pressure-like bi-temporal/retro-orbital pain with nausea.  Responds to sumatriptan.  Increased frequency as above.   Gait:  Progressive gait decline.  Uses lift power scooter.  She needs assistance/walker now to ambulate.   Bowel/Bladder:  Reports both bowel and bladder incontinence.  Fairly new. Fatigue:  yes Cognition:  Word finding issues.   Mood:  Some depression.   Sister reportedly thinks she is very depressed and has a short-temper.   Migraines:  Started zonisamide in September.  Now infrequent  Aborts quickly with sumatriptan. On disability     HISTORY: She was diagnosed with multiple sclerosis in the early 1990s after experiencing right optic neuritis.  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  She underwent LP and MRIs, which were consistent with MS.  She initially started Betaseron and then Avonex until 2011, after which she was switched to Tysabri.  Within the last 5 years, she has had progression of disease, but particularly worse since moving here in March.  She has fallen 4 times.  She started using a walker about 5 years ago.     She had an MS flare in May 2021, describing increased difficulty walking and bilateral lower extremity numbness and tingling.  For paresthesias, she was started on gabapentin.  MRI of spinal cord showed acute lesions.  Started Mayzent in July 2021 but patient noted increased paresthesias in the legs.  She was prescribed high-dose prednisone.  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 She then was receiving Solu-Medrol every month. Other past medications: Gabapentin (weakness)    Imaging: 09/16/2020 MRI BRAIN W WO:  stable with resolution of previous small active lesion 09/09/2020 MRI C & T-SPINE W WO:  1. Unchanged distribution of demyelinating lesions throughout the cervical spinal cord. Motion degraded images of the thoracic spine,but grossly unchanged. 2. No evidence of active demyelination within the cervical or thoracic spine.  04/14/2020 MRI BRAIN W WO:  subcentimeter focus of enhancement within the left posterior medulla, consistent with active demyelination. 12/23/2019 MRI CERVICAL SPINE W WO:  1. Extensive patchy T2 signal abnormality throughout the cervical spinal cord, consistent with demyelinating disease/multiple sclerosis. Faint patchy postcontrast enhancement about a few lesions.  AT C4 AND C7, consistent with active demyelination.  2. Mild multilevel cervical spondylosis without significant spinal stenosis. Mild bilateral C6 foraminal narrowing. 12/23/2019 MRI THORACIC SPINE W WO:  1. Patchy signal abnormality throughout much of the thoracic spinal cord, consistent with demyelinating disease/multiple sclerosis. No evidence for active demyelination within the thoracic spinal cord.  2. Acute to subacute compression fracture extending through the inferior endplate of L2 with up to 40% height loss and 4 mm of bony retropulsion, incompletely assessed on this exam. Finding could be further evaluated with dedicated MRI of the lumbar spine as clinically desired.  3. Additional chronic lower thoracic compression fractures as above. 07/24/2019 MRI BRAIN  W WO:  1.  Bilateral periventricular and radial white matter hyperintensities involving the frontal parietal and temporal regions bilaterally stable compared to 04/25/19.  2.  Compared to prior study the white matter hyperintensities, T1 low signal foci associated with these, and the absence of contrast enhancement appears stable.  3.  No evidence of new plaques or contrast enhancement to suggest new inflammatory  foci. 04/25/2019 MRI BRAIN W WO:  CEREBRUM:  Extensive multiple sclerosis plaques are visualized.  These is seen adjacent to the tmporal horns bilaterally extending to surround the atria and occipital horns bilaterally and extensively throughout the periventricular white matter bilateral.  These also extend into the bifrontal corona radiata and centrum semiovale.  No involvement of brainstem or cerebellum.  PAST MEDICAL HISTORY: Past Medical History:  Diagnosis Date   Depression    Phreesia 08/06/2020   Hx of migraines    Hyperglycemia    Hypothyroidism    Migraines    Multiple sclerosis (HCC)     MEDICATIONS: Current Outpatient Medications on File Prior to Visit  Medication Sig Dispense Refill   alendronate (FOSAMAX) 70 MG tablet Take 1 tablet (70 mg total) by mouth once a week. Take with a full glass of water on an empty stomach. 90 tablet 0   diazepam (VALIUM) 5 MG tablet TAKE 1 TABLET BY MOUTH 30 TO 40 MINUTES BEFORE MRI 2 tablet 0   gabapentin (NEURONTIN) 100 MG capsule TAKE 1 CAPSULE THREE TIMES A DAY 108 capsule 0   levothyroxine (SYNTHROID) 50 MCG tablet TAKE 1 TABLET DAILY BEFORE BREAKFAST 90 tablet 3   sertraline (ZOLOFT) 25 MG tablet TAKE 1 TABLET DAILY 90 tablet 3   simvastatin (ZOCOR) 20 MG tablet TAKE 1 TABLET DAILY 90 tablet 3   Siponimod Fumarate (MAYZENT) 1 MG TABS TAKE 1 TABLET (1 MG) DAILY 90 tablet 0   SUMAtriptan (IMITREX) 100 MG tablet TAKE 1 TABLET EVERY 2 HOURS AS NEEDED FOR MIGRAINE. MAY REPEAT IN 2 HOURS IF HEADACHE PERSISTS OR RECURS 27 tablet 0   Vitamin D, Ergocalciferol, (DRISDOL) 1.25 MG (50000 UNIT) CAPS capsule TAKE 1 CAPSULE EVERY 7 DAYS 5 capsule 9   zonisamide (ZONEGRAN) 100 MG capsule TAKE 1 CAPSULE DAILY 30 capsule 0   No current facility-administered medications on file prior to visit.    ALLERGIES: Allergies  Allergen Reactions   Codeine Nausea And Vomiting    FAMILY HISTORY: No family history on file.    Objective:  Blood pressure  (!) 146/83, pulse 71, height 5\' 3"  (1.6 m), weight 128 lb (58.1 kg), SpO2 99 %. General: No acute distress.  Patient appears well-groomed.   Head:  Normocephalic/atraumatic Eyes:  Fundi examined but not visualized Neck: supple, no paraspinal tenderness, full range of motion Heart:  Regular rate and rhythm Back: No paraspinal tenderness Neurological Exam: alert and oriented to person, place, and time.  Speech fluent and not dysarthric, language intact.  Right APD, right INO.  Otherwise, CN II-XII intact. Muscle strength 4+/5 right hip flexion, 3+/5 left hip flexion, 4+/5 right ankle dorsiflexion.  Sensation to pinprick and vibration slightly reduced in right upper and lower extremities.  DTR 2+ throughout, toes downgoing.  Finger to nose testing with trace ataxia but able to meet my fingertip.  In wheelchair.  Able to stand.  Broad-based stance.  Requires assistance to ambulate.   , DO  CC: Shon Millet, MD

## 2022-01-13 NOTE — Assessment & Plan Note (Signed)
On Alendronate Vitamin D 50,000 IU once weekly

## 2022-01-13 NOTE — Assessment & Plan Note (Signed)
Now stable compared to previous visit Appears to be due to nutritional deficiency Advised to continue Ensure supplement

## 2022-01-13 NOTE — Assessment & Plan Note (Signed)
Well-controlled On Zoloft

## 2022-01-13 NOTE — Patient Instructions (Addendum)
Please continue taking medications as prescribed.  Please consider getting Shingrix and Tdap vaccines at your local pharmacy.

## 2022-01-13 NOTE — Assessment & Plan Note (Signed)
Noted on MRI of thoracic spine, chronic Has history of osteoporosis, on Fosamax and vitamin D supplement

## 2022-01-13 NOTE — Assessment & Plan Note (Signed)
On Sumatriptan PRN On Zonisamide for ppx Follows up with Neurologist

## 2022-01-13 NOTE — Assessment & Plan Note (Addendum)
On Levothyroxine 50 mcg QD Check TSH and free T4 as she has recent heat insensitivity

## 2022-01-13 NOTE — Progress Notes (Signed)
Established Patient Office Visit  Subjective:  Patient ID: Pamela Roman, female    DOB: 12-24-1955  Age: 66 y.o. MRN: [id number]  CC:  Chief Complaint  Patient presents with   Follow-up    6 month follow up Pt is losing balance as she is having a flare up of MS     HPI Pamela Roman is a 66 y.o. female with past medical history of progressive multiple sclerosis, physical deconditioning, osteoporosis, multiple falls, hypothyroidism and depression who presents for f/u of her chronic medical conditions.  MS: She recently had MRI of brain and thoracic lumbar spine.   She takes Mayzent for MS and follows up with neurologist. She reports having multiple falls in the past. She had been doing physical training at [facility] and her ambulation was getting better. She complains of recent worsening of fatigue and leg weakness.  She reports recent travel to [place] and feels that she exhausted herself during the visit.  She is going to see her neurologist tomorrow for f/u of MS.  Weight loss: Her weight is stable now. She takes Ensure supplements.   Hypothyroidism: Takes Levothyroxine regularly.  She reports heat insensitivity recently.    Past Medical History:  Diagnosis Date   Depression    Phreesia 08/06/2020   Hx of migraines    Hyperglycemia    Hypothyroidism    Migraines    Multiple sclerosis ([place])     Past Surgical History:  Procedure Laterality Date   CATARACT EXTRACTION W/PHACO Left 01/01/2020   Procedure: CATARACT EXTRACTION PHACO AND INTRAOCULAR LENS PLACEMENT LEFT EYE;  Surgeon: Baruch Goldmann, MD;  Location: [facility];  Service: Ophthalmology;  Laterality: Left;  CDE: 3.37   CATARACT EXTRACTION W/PHACO Right 01/19/2020   Procedure: CATARACT EXTRACTION PHACO AND INTRAOCULAR LENS PLACEMENT RIGHT EYE;  Surgeon: Baruch Goldmann, MD;  Location: [facility];  Service: Ophthalmology;  Laterality: Right;  CDE: 3.16   TONSILLECTOMY      History reviewed. No pertinent family history.  Social  History   Socioeconomic History   Marital status: Widowed    Spouse name: Not on file   Number of children: Not on file   Years of education: Not on file   Highest education level: Not on file  Occupational History   Not on file  Tobacco Use   Smoking status: Never   Smokeless tobacco: Never   Tobacco comments:    Lived with smoker  Vaping Use   Vaping Use: Never used  Substance and Sexual Activity   Alcohol use: Not Currently   Drug use: Never   Sexual activity: Not Currently  Other Topics Concern   Not on file  Social History Narrative   ** Merged History Encounter **       Lives with her sister two story home stays on the first floor Left handed   Social Determinants of Health   Financial Resource Strain: Low Risk  (11/24/2021)   Overall Financial Resource Strain (CARDIA)    Difficulty of Paying Living Expenses: Not hard at all  Food Insecurity: No Food Insecurity (11/24/2021)   Hunger Vital Sign    Worried About Running Out of Food in the Last Year: Never true    [place] in the Last Year: Never true  Transportation Needs: No Transportation Needs (11/24/2021)   PRAPARE - Hydrologist (Medical): No    Lack of Transportation (Non-Medical): No  Physical Activity: Insufficiently Active (11/24/2021)  Exercise Vital Sign    Days of Exercise per Week: 4 days    Minutes of Exercise per Session: 30 min  Stress: No Stress Concern Present (11/18/2020)   [place]    Feeling of Stress : Not at all  Social Connections: Moderately Integrated (11/24/2021)   Social Connection and Isolation Panel [NHANES]    Frequency of Communication with Friends and Family: More than three times a week    Frequency of Social Gatherings with Friends and Family: More than three times a week    Attends Religious Services: More than 4 times per year    Active Member of [employer] or Organizations: Yes     Attends Archivist Meetings: 1 to 4 times per year    Marital Status: Widowed  Intimate Partner Violence: Not on file    Outpatient Medications Prior to Visit  Medication Sig Dispense Refill   alendronate (FOSAMAX) 70 MG tablet Take 1 tablet (70 mg total) by mouth once a week. Take with a full glass of water on an empty stomach. 90 tablet 0   diazepam (VALIUM) 5 MG tablet TAKE 1 TABLET BY MOUTH 30 TO 40 MINUTES BEFORE MRI 2 tablet 0   gabapentin (NEURONTIN) 100 MG capsule TAKE 1 CAPSULE THREE TIMES A DAY 108 capsule 0   levothyroxine (SYNTHROID) 50 MCG tablet TAKE 1 TABLET DAILY BEFORE BREAKFAST 90 tablet 3   sertraline (ZOLOFT) 25 MG tablet TAKE 1 TABLET DAILY 90 tablet 3   simvastatin (ZOCOR) 20 MG tablet TAKE 1 TABLET DAILY 90 tablet 3   Siponimod Fumarate (MAYZENT) 1 MG TABS TAKE 1 TABLET (1 MG) DAILY 90 tablet 0   SUMAtriptan (IMITREX) 100 MG tablet TAKE 1 TABLET EVERY 2 HOURS AS NEEDED FOR MIGRAINE. MAY REPEAT IN 2 HOURS IF HEADACHE PERSISTS OR RECURS 27 tablet 0   Vitamin D, Ergocalciferol, (DRISDOL) 1.25 MG (50000 UNIT) CAPS capsule TAKE 1 CAPSULE EVERY 7 DAYS 5 capsule 9   zonisamide (ZONEGRAN) 100 MG capsule TAKE 1 CAPSULE DAILY 30 capsule 0   No facility-administered medications prior to visit.    Allergies  Allergen Reactions   Codeine Nausea And Vomiting    ROS Review of Systems  Constitutional:  Negative for chills and fever.  HENT:  Negative for congestion, sinus pain, sore throat and voice change.   Eyes:  Positive for visual disturbance. Negative for pain.  Respiratory:  Negative for cough and shortness of breath.   Cardiovascular:  Negative for chest pain and palpitations.  Gastrointestinal:  Negative for constipation, diarrhea, nausea and vomiting.  Endocrine: Negative for polydipsia and polyuria.  Genitourinary:  Negative for dysuria and hematuria.  Musculoskeletal:  Negative for neck pain and neck stiffness.  Skin:  Negative for rash.   Neurological:  Positive for weakness and numbness. Negative for dizziness.  Psychiatric/Behavioral:  Negative for agitation and behavioral problems.       Objective:    Physical Exam Vitals reviewed.  Constitutional:      General: She is not in acute distress.    Appearance: She is not diaphoretic.     Comments: Has a rolling walker  HENT:     Head: Normocephalic and atraumatic.     Nose: Nose normal.     Mouth/Throat:     Mouth: Mucous membranes are moist.  Eyes:     General: No scleral icterus.    Extraocular Movements: Extraocular movements intact.  Cardiovascular:     Rate  and Rhythm: Normal rate and regular rhythm.     Pulses: Normal pulses.     Heart sounds: Normal heart sounds. No murmur heard. Pulmonary:     Breath sounds: Normal breath sounds. No wheezing or rales.  Musculoskeletal:     Cervical back: Neck supple. No tenderness.     Right lower leg: No edema.     Left lower leg: No edema.  Skin:    General: Skin is warm.     Findings: No rash.  Neurological:     General: No focal deficit present.     Mental Status: She is alert and oriented to person, place, and time.     Sensory: Sensory deficit (B/l LE) present.     Motor: Weakness (B/l UE and LE - 4/5) present.  Psychiatric:        Mood and Affect: Mood normal.        Behavior: Behavior normal.     BP 128/80 (BP Location: Right Arm, Patient Position: Sitting, Cuff Size: Normal)   Pulse 70   Resp 18   Ht _0  (1.6 m)   Wt 128 lb 12.8 oz (58.4 kg)   SpO2 100%   BMI 22.82 kg/m  Wt Readings from Last 3 Encounters:  01/13/22 128 lb 12.8 oz (58.4 kg)  07/16/21 125 lb 1.9 oz (56.8 kg)  06/05/21 125 lb (56.7 kg)    Lab Results  Component Value Date   TSH 2.230 07/16/2021   Lab Results  Component Value Date   WBC 4.5 07/16/2021   HGB 14.2 07/16/2021   HCT 42.8 07/16/2021   MCV 92 07/16/2021   PLT 258 07/16/2021   Lab Results  Component Value Date   NA 143 07/16/2021   K 4.6 07/16/2021    CO2 24 07/16/2021   GLUCOSE 91 07/16/2021   BUN 20 07/16/2021   CREATININE 0.70 07/16/2021   BILITOT 0.6 06/05/2021   ALKPHOS 101 06/05/2021   AST 21 06/05/2021   ALT 20 06/05/2021   PROT 6.8 06/05/2021   ALBUMIN 4.4 06/05/2021   CALCIUM 9.4 07/16/2021   EGFR 96 07/16/2021   GFR 79.97 06/05/2021   Lab Results  Component Value Date   CHOL 200 (H) 11/06/2020   Lab Results  Component Value Date   HDL 54 11/06/2020   Lab Results  Component Value Date   LDLCALC 123 (H) 11/06/2020   Lab Results  Component Value Date   TRIG 129 11/06/2020   Lab Results  Component Value Date   CHOLHDL 3.7 11/06/2020   No results found for: "HGBA1C"    Assessment & Plan:   Problem List Items Addressed This Visit       Cardiovascular and Mediastinum   Migraine    On Sumatriptan PRN On Zonisamide for ppx Follows up with Neurologist        Endocrine   Hypothyroidism    On Levothyroxine 50 mcg QD Check TSH and free T4 as she has recent heat insensitivity      Relevant Orders   TSH + free T4     Nervous and Auditory   Multiple sclerosis ([place]) - Primary    On Mayzent 1 mg QD Progressive neurologic deficits Has paresthesia in the legs, physical deconditioning leading to wheelchair/rolling walker use Follows up with Neurologist - last visit note reviewed        Musculoskeletal and Integument   Osteoporosis    On Alendronate Vitamin D 50,000 IU once weekly  Thoracic compression fracture (HCC)    Noted on MRI of thoracic spine, chronic Has history of osteoporosis, on Fosamax and vitamin D supplement        Other   Weight loss    Now stable compared to previous visit Appears to be due to nutritional deficiency Advised to continue Ensure supplement      Depression, major, single episode, moderate (HCC)    Well-controlled On Zoloft       No orders of the defined types were placed in this encounter.   Follow-up: Return in about 6 months (around 07/16/2022).     Lindell Spar, MD

## 2022-01-13 NOTE — Assessment & Plan Note (Addendum)
On Mayzent 1 mg QD Progressive neurologic deficits Has paresthesia in the legs, physical deconditioning leading to wheelchair/rolling walker use Follows up with Neurologist - last visit note reviewed 

## 2022-01-14 ENCOUNTER — Encounter: Payer: Self-pay | Admitting: Neurology

## 2022-01-14 ENCOUNTER — Ambulatory Visit (INDEPENDENT_AMBULATORY_CARE_PROVIDER_SITE_OTHER): Payer: Medicare Other | Admitting: Neurology

## 2022-01-14 VITALS — BP 146/83 | HR 71 | Ht 63.0 in | Wt 128.0 lb

## 2022-01-14 DIAGNOSIS — E559 Vitamin D deficiency, unspecified: Secondary | ICD-10-CM

## 2022-01-14 DIAGNOSIS — G35 Multiple sclerosis: Secondary | ICD-10-CM

## 2022-01-14 DIAGNOSIS — G43009 Migraine without aura, not intractable, without status migrainosus: Secondary | ICD-10-CM

## 2022-01-14 NOTE — Patient Instructions (Signed)
Check CBC with diff, CMP, vit D as well as thyroid test today. No change in management at this time Follow up with PCP regarding bowel and bladder dysfunction Follow up with me in 6 months.

## 2022-01-16 ENCOUNTER — Telehealth: Payer: Self-pay | Admitting: Neurology

## 2022-01-16 DIAGNOSIS — G35 Multiple sclerosis: Secondary | ICD-10-CM

## 2022-01-16 DIAGNOSIS — R200 Anesthesia of skin: Secondary | ICD-10-CM

## 2022-01-16 NOTE — Telephone Encounter (Signed)
Patient prefers Home health to come out for PT,OT. Referral sent last yr need a new one.   Please advise

## 2022-01-16 NOTE — Telephone Encounter (Signed)
PT/OT home health order sent to [facility].

## 2022-01-16 NOTE — Telephone Encounter (Signed)
Patient called and stated she wanted to see about getting some kind of physical therapy or occupational therapy.

## 2022-01-19 ENCOUNTER — Encounter: Payer: Self-pay | Admitting: Neurology

## 2022-01-19 ENCOUNTER — Telehealth: Payer: Self-pay | Admitting: Neurology

## 2022-01-19 NOTE — Telephone Encounter (Signed)
She stated she was calling to see if her in home physical therapy had been set up?  She was following up.

## 2022-01-19 NOTE — Telephone Encounter (Signed)
Telephone call to patient, Is there anything else that needs to be mentioned on the letter. Per Patient, Yes. Advised patient I will call Pamela Roman just to make sure that we have everything in the letter that is needed to help patient get approved.  LMOVM for [name], Please give [place] a call back.

## 2022-01-19 NOTE — Telephone Encounter (Signed)
Waiting on Response from [facility].

## 2022-01-19 NOTE — Telephone Encounter (Signed)
Pt called in stating she is trying to get a grant for some modifications for her home. She needs a confirmation of her MS diagnosis sent to the [facility] [employer]. Their fax number is [phone]. ATTNCephus Slater.

## 2022-01-19 NOTE — Telephone Encounter (Signed)
Patient called and said she needs some more information faxed to the MS Society for modifications to be done to her home for her disability.  Needs a ramp outside of home Needs larger doorways (handicap accessible) inside of the home.  [employer] [phone], fax Attn: Vicenta Aly

## 2022-01-19 NOTE — Telephone Encounter (Signed)
LMOVM letter prepared and faxed to Cephus Slater

## 2022-01-20 ENCOUNTER — Encounter: Payer: Self-pay | Admitting: Neurology

## 2022-01-20 NOTE — Telephone Encounter (Signed)
[place] returned call to [facility].  Please include:  An Rx from Dr. Everlena Cooper stating the patient needs a ramp for accessibility purposes and also home modification.  Fax: [phone]

## 2022-01-26 ENCOUNTER — Other Ambulatory Visit: Payer: Self-pay | Admitting: Internal Medicine

## 2022-02-02 ENCOUNTER — Telehealth: Payer: Self-pay | Admitting: Neurology

## 2022-02-02 NOTE — Telephone Encounter (Signed)
Patient has had 2 falls this weekend  and has knee laceration  please call Loraine Leriche at [facility] [phone]

## 2022-02-02 NOTE — Telephone Encounter (Signed)
Just an FYI he stated 2 falls while walking at home.

## 2022-02-04 ENCOUNTER — Telehealth: Payer: Self-pay | Admitting: Neurology

## 2022-02-04 NOTE — Telephone Encounter (Signed)
Pt called in stating she would like to see if she can get a power wheelchair?

## 2022-02-05 NOTE — Telephone Encounter (Signed)
Pt scheduled an appointment for the power wheelchair assessment on 02/17/22. She would like to get it at [facility]. Their phone number is [phone].

## 2022-02-09 ENCOUNTER — Telehealth: Payer: Self-pay | Admitting: Neurology

## 2022-02-09 NOTE — Telephone Encounter (Signed)
Spoke to the patient,  she fell trying to go to the bathroom and while trying to carry something as well.  Patient states she do not have any injuries.

## 2022-02-09 NOTE — Telephone Encounter (Signed)
Mark from [facility] called and stated Pamela Roman experienced a fall this morning with no injuries. [phone].

## 2022-02-10 ENCOUNTER — Emergency Department (HOSPITAL_COMMUNITY)
Admission: EM | Admit: 2022-02-10 | Discharge: 2022-02-12 | Disposition: A | Discharge status: Other Acute Inpatient Hospital | Payer: Medicare Other | Attending: Emergency Medicine | Admitting: Emergency Medicine

## 2022-02-10 ENCOUNTER — Other Ambulatory Visit: Payer: Self-pay

## 2022-02-10 ENCOUNTER — Encounter (HOSPITAL_COMMUNITY): Payer: Self-pay | Admitting: Emergency Medicine

## 2022-02-10 DIAGNOSIS — Z79899 Other long term (current) drug therapy: Secondary | ICD-10-CM | POA: Diagnosis not present

## 2022-02-10 DIAGNOSIS — T43202A Poisoning by unspecified antidepressants, intentional self-harm, initial encounter: Secondary | ICD-10-CM | POA: Insufficient documentation

## 2022-02-10 DIAGNOSIS — Z20822 Contact with and (suspected) exposure to covid-19: Secondary | ICD-10-CM | POA: Diagnosis not present

## 2022-02-10 DIAGNOSIS — X838XXA Intentional self-harm by other specified means, initial encounter: Secondary | ICD-10-CM | POA: Diagnosis not present

## 2022-02-10 DIAGNOSIS — F332 Major depressive disorder, recurrent severe without psychotic features: Secondary | ICD-10-CM | POA: Diagnosis not present

## 2022-02-10 DIAGNOSIS — R45851 Suicidal ideations: Secondary | ICD-10-CM | POA: Insufficient documentation

## 2022-02-10 DIAGNOSIS — T50902A Poisoning by unspecified drugs, medicaments and biological substances, intentional self-harm, initial encounter: Secondary | ICD-10-CM

## 2022-02-10 DIAGNOSIS — F32A Depression, unspecified: Secondary | ICD-10-CM | POA: Diagnosis present

## 2022-02-10 DIAGNOSIS — E039 Hypothyroidism, unspecified: Secondary | ICD-10-CM | POA: Diagnosis not present

## 2022-02-10 DIAGNOSIS — R531 Weakness: Secondary | ICD-10-CM | POA: Diagnosis not present

## 2022-02-10 LAB — SALICYLATE LEVEL: Salicylate Lvl: <7 mg/dL — ABNORMAL LOW (ref 7.0–30.0)

## 2022-02-10 LAB — CBC WITH DIFFERENTIAL/PLATELET
Abs Immature Granulocytes: 0.06 10*3/uL (ref 0.00–0.07)
Basophils Absolute: 0 10*3/uL (ref 0.0–0.1)
Basophils Relative: 0 %
Eosinophils Absolute: 0.1 10*3/uL (ref 0.0–0.5)
Eosinophils Relative: 1 %
HCT: 42.6 % (ref 36.0–46.0)
Hemoglobin: 13.9 g/dL (ref 12.0–15.0)
Immature Granulocytes: 1 %
Lymphocytes Relative: 8 %
Lymphs Abs: 0.8 10*3/uL (ref 0.7–4.0)
MCH: 31.2 pg (ref 26.0–34.0)
MCHC: 32.6 g/dL (ref 30.0–36.0)
MCV: 95.5 fL (ref 80.0–100.0)
Monocytes Absolute: 1.1 10*3/uL — ABNORMAL HIGH (ref 0.1–1.0)
Monocytes Relative: 10 %
Neutro Abs: 8.3 10*3/uL — ABNORMAL HIGH (ref 1.7–7.7)
Neutrophils Relative %: 80 %
Platelets: 240 10*3/uL (ref 150–400)
RBC: 4.46 MIL/uL (ref 3.87–5.11)
RDW: 13.1 % (ref 11.5–15.5)
WBC: 10.3 10*3/uL (ref 4.0–10.5)
nRBC: 0 % (ref 0.0–0.2)

## 2022-02-10 LAB — COMPREHENSIVE METABOLIC PANEL
ALT: 30 U/L (ref 0–44)
AST: 31 U/L (ref 15–41)
Albumin: 4.3 g/dL (ref 3.5–5.0)
Alkaline Phosphatase: 106 U/L (ref 38–126)
Anion gap: 7 (ref 5–15)
BUN: 27 mg/dL — ABNORMAL HIGH (ref 8–23)
CO2: 26 mmol/L (ref 22–32)
Calcium: 9.3 mg/dL (ref 8.9–10.3)
Chloride: 108 mmol/L (ref 98–111)
Creatinine, Ser: 0.78 mg/dL (ref 0.44–1.00)
GFR, Estimated: >60 mL/min (ref >60)
Glucose, Bld: 178 mg/dL — ABNORMAL HIGH (ref 70–99)
Potassium: 3.3 mmol/L — ABNORMAL LOW (ref 3.5–5.1)
Sodium: 141 mmol/L (ref 135–145)
Total Bilirubin: 0.7 mg/dL (ref 0.3–1.2)
Total Protein: 7.4 g/dL (ref 6.5–8.1)

## 2022-02-10 LAB — MAGNESIUM: Magnesium: 2.2 mg/dL (ref 1.7–2.4)

## 2022-02-10 LAB — ETHANOL: Alcohol, Ethyl (B): <10 mg/dL (ref <10)

## 2022-02-10 LAB — ACETAMINOPHEN LEVEL: Acetaminophen (Tylenol), Serum: <10 ug/mL — ABNORMAL LOW (ref 10–30)

## 2022-02-10 MED ORDER — CHARCOAL ACTIVATED PO LIQD
50.0000 g | Freq: Once | ORAL | Status: AC
  Administered 2022-02-10: 50 g via ORAL
  Filled 2022-02-10: qty 240

## 2022-02-10 NOTE — ED Notes (Signed)
Pt wanded by security 1 bag personal belongings in security lock up

## 2022-02-10 NOTE — ED Notes (Signed)
Pt incontinent of Bowl on arrival via EMS. Pt cleaned up in bathroom, and pt placed in burgundy

## 2022-02-10 NOTE — ED Notes (Signed)
Per poison control they recommend activated charcoa without sorbitol 1g/kg if pt is willing to take it. Pt will need repeat tylenol level at 2300 and repeat ekg at 0100. Watch for CNS depression, agitation, seizures, hyponatremia and QTC prolong. Treat seizures with benzos and if QTC>500 treat by bringing magnesium and potassium to high normal level. Pt needs to be watched closely for a minimum of 6 hours. EDP notified.

## 2022-02-10 NOTE — ED Provider Notes (Signed)
[facility] EMERGENCY DEPARTMENT Provider Note   CSN: [id number] Arrival date & time: 02/10/22  2033     History  Chief Complaint  Patient presents with   V70.1    Pamela Roman is a 66 y.o. female with a history of MS, multiple falls, history of depression, hypothyroidism, hyperlipidemia, migraine headaches and osteoporosis presenting for evaluation of overdose.  She endorses simply feeling "lost", sad and depressed and endorses taking a full 90 tablet bottle of Zoloft 25 mg strength tablets around 7 PM this evening.  She elaborates that her husband died 4 years ago, and she was thinking how nice it would be to be able to be with him.  She denies any physical symptoms currently.  She did have an urgent bowel movement immediately after arrival, has no other complaints at this time.  She states that she has never attempted suicide in the past and states she would never do this again.   Of note, at baseline patient uses a wheelchair and a rolling walker for ambulation.  She lives with a sister.  She does have home health care.  The history is provided by the patient.       Home Medications Prior to Admission medications   Medication Sig Start Date End Date Taking? Authorizing Provider  alendronate (FOSAMAX) 70 MG tablet TAKE 1 TABLET ONCE A WEEK WITH A FULL GLASS OF WATER ON AN EMPTY STOMACH 01/26/22   Anabel Halon, MD  diazepam (VALIUM) 5 MG tablet TAKE 1 TABLET BY MOUTH 30 TO 40 MINUTES BEFORE MRI Patient not taking: Reported on 01/14/2022 12/26/21   Drema Dallas, DO  gabapentin (NEURONTIN) 100 MG capsule TAKE 1 CAPSULE THREE TIMES A DAY Patient not taking: Reported on 01/14/2022 12/09/21   Drema Dallas, DO  levothyroxine (SYNTHROID) 50 MCG tablet TAKE 1 TABLET DAILY BEFORE BREAKFAST 03/03/21   Anabel Halon, MD  sertraline (ZOLOFT) 25 MG tablet TAKE 1 TABLET DAILY 04/02/21   Anabel Halon, MD  simvastatin (ZOCOR) 20 MG tablet TAKE 1 TABLET DAILY 03/26/21   Anabel Halon, MD   Siponimod Fumarate (MAYZENT) 1 MG TABS TAKE 1 TABLET (1 MG) DAILY 11/20/21   Jaffe, Adam R, DO  SUMAtriptan (IMITREX) 100 MG tablet TAKE 1 TABLET EVERY 2 HOURS AS NEEDED FOR MIGRAINE. MAY REPEAT IN 2 HOURS IF HEADACHE PERSISTS OR RECURS 05/21/21   Drema Dallas, DO  Vitamin D, Ergocalciferol, (DRISDOL) 1.25 MG (50000 UNIT) CAPS capsule TAKE 1 CAPSULE EVERY 7 DAYS 04/25/21   Drema Dallas, DO  zonisamide (ZONEGRAN) 100 MG capsule TAKE 1 CAPSULE DAILY 12/10/21   Drema Dallas, DO      Allergies    Codeine    Review of Systems   Review of Systems  Constitutional:  Negative for fever.  HENT:  Negative for congestion and sore throat.   Eyes: Negative.   Respiratory:  Negative for chest tightness and shortness of breath.   Cardiovascular:  Negative for chest pain and palpitations.  Gastrointestinal:  Positive for nausea and vomiting. Negative for abdominal pain.       Patient reports she became nauseated and vomited once after she was given activated charcoal here.  She did not have the symptoms prior to receiving this treatment.  Genitourinary: Negative.   Musculoskeletal:  Negative for arthralgias, joint swelling and neck pain.  Skin: Negative.  Negative for rash and wound.  Neurological:  Negative for dizziness, weakness, light-headedness, numbness and headaches.  Psychiatric/Behavioral:  Positive for self-injury and suicidal ideas.     Physical Exam Updated Vital Signs BP (!) 141/73   Pulse 82   Resp 19   Ht 5\' 3"  (1.6 m)   Wt 58.1 kg   SpO2 95%   BMI 22.69 kg/m  Physical Exam Vitals and nursing note reviewed.  Constitutional:      Appearance: She is well-developed.  HENT:     Head: Normocephalic and atraumatic.  Eyes:     Conjunctiva/sclera: Conjunctivae normal.  Cardiovascular:     Rate and Rhythm: Normal rate and regular rhythm.     Heart sounds: Normal heart sounds.  Pulmonary:     Effort: Pulmonary effort is normal.     Breath sounds: Normal breath sounds. No  wheezing.  Abdominal:     General: Bowel sounds are normal.     Palpations: Abdomen is soft.     Tenderness: There is no abdominal tenderness. There is no guarding.  Musculoskeletal:        General: Normal range of motion.  Skin:    General: Skin is warm and dry.  Neurological:     Mental Status: She is alert. Mental status is at baseline.     Motor: Weakness present.     Gait: Gait abnormal.     ED Results / Procedures / Treatments   Labs (all labs ordered are listed, but only abnormal results are displayed) Labs Reviewed  COMPREHENSIVE METABOLIC PANEL - Abnormal; Notable for the following components:      Result Value   Potassium 3.3 (*)    Glucose, Bld 178 (*)    BUN 27 (*)    All other components within normal limits  CBC WITH DIFFERENTIAL/PLATELET - Abnormal; Notable for the following components:   Neutro Abs 8.3 (*)    Monocytes Absolute 1.1 (*)    All other components within normal limits  SALICYLATE LEVEL - Abnormal; Notable for the following components:   Salicylate Lvl <7.0 (*)    All other components within normal limits  ACETAMINOPHEN LEVEL - Abnormal; Notable for the following components:   Acetaminophen (Tylenol), Serum <10 (*)    All other components within normal limits  SARS CORONAVIRUS 2 BY RT PCR  ETHANOL  MAGNESIUM  RAPID URINE DRUG SCREEN, HOSP PERFORMED  ACETAMINOPHEN LEVEL    EKG EKG Interpretation  Date/Time:  Tuesday February 10 2022 21:20:35 EDT Ventricular Rate:  85 PR Interval:  138 QRS Duration: 95 QT Interval:  381 QTC Calculation: 453 R Axis:   69 Text Interpretation: Sinus rhythm Ventricular premature complex Low voltage, precordial leads No old tracing to compare Confirmed by [id number]) on 02/10/2022 9:27:46 PM  Radiology No results found.  Procedures Procedures    Medications Ordered in ED Medications  charcoal activated (NO SORBITOL) (ACTIDOSE-AQUA) suspension 50 g (50 g Oral Given 02/10/22 2144)    ED  Course/ Medical Decision Making/ A&P Clinical Course as of 02/10/22 2338  Tue Feb 10, 2022  2052 She is brought in from home via EMS after an ingestion of a bottle of Zoloft around 7 PM tonight.  She is awake and alert.  Getting lab work monitoring and consult to poison control.  Disposition per results of testing. [MB]  2336 OD on 90 zoloft tabs, med clear at 0300 [MK]    Clinical Course User Index [MB] 2337, MD [MK] Kommor, Terrilee Files, MD  Medical Decision Making Patient with a history of MS, depression with ingestion of Zoloft around 7 PM, stating she took 90 tablets 25 mg strength.  Denies history of prior suicide attempt.  Our charge nurse contacted poison control who advised activated charcoal without sorbitol, this was given, patient did not tolerate this well although was willing to try, she did vomit at least a portion of the charcoal.  Will need a repeat Tylenol level at 2300 and repeat EKG at 1 AM.  Will need 6-hour observation prior to medical clearance.  Watch for CNS depression, agitation, seizures hyponatremia and QTc prolongation.  If QTc prolongs greater than 500 increase magnesium and potassium to high normal levels.  Patient signed out to Dr. Posey Rea.   Amount and/or Complexity of Data Reviewed Labs: ordered.           Final Clinical Impression(s) / ED Diagnoses Final diagnoses:  Intentional overdose, initial encounter [facility])    Rx / DC Orders ED Discharge Orders     None         Victoriano Lain 02/10/22 2338    Terrilee Files, MD 02/11/22 1233

## 2022-02-10 NOTE — ED Triage Notes (Signed)
Pt took 90 25mg  of zoloft around 1900 to kill herself.

## 2022-02-11 ENCOUNTER — Other Ambulatory Visit: Payer: Self-pay | Admitting: Internal Medicine

## 2022-02-11 ENCOUNTER — Encounter (HOSPITAL_COMMUNITY): Payer: Self-pay

## 2022-02-11 DIAGNOSIS — F332 Major depressive disorder, recurrent severe without psychotic features: Secondary | ICD-10-CM | POA: Diagnosis not present

## 2022-02-11 LAB — SARS CORONAVIRUS 2 BY RT PCR: SARS Coronavirus 2 by RT PCR: NEGATIVE

## 2022-02-11 LAB — RAPID URINE DRUG SCREEN, HOSP PERFORMED
Amphetamines: NOT DETECTED
Barbiturates: NOT DETECTED
Benzodiazepines: NOT DETECTED
Cocaine: NOT DETECTED
Opiates: NOT DETECTED
Tetrahydrocannabinol: NOT DETECTED

## 2022-02-11 LAB — ACETAMINOPHEN LEVEL: Acetaminophen (Tylenol), Serum: <10 ug/mL — ABNORMAL LOW (ref 10–30)

## 2022-02-11 MED ORDER — ACETAMINOPHEN 325 MG PO TABS
650.0000 mg | ORAL_TABLET | ORAL | Status: DC | PRN
  Administered 2022-02-11: 650 mg via ORAL
  Filled 2022-02-11: qty 2

## 2022-02-11 NOTE — ED Notes (Signed)
This paramedic spoke with Vikki Ports  RN  with [place] poison control. She advised that it was over the 6 hour mark for observation and was closing her notes out regarding the incident.

## 2022-02-11 NOTE — BH Assessment (Addendum)
Comprehensive Clinical Assessment (CCA) Note  02/11/2022 Pamela Roman [id number]  Disposition: Pamela Goldsmith, NP, recommended inpatient [facility] psychiatric treatment. Disposition Social Worker will seek placement for patient.   Chief Complaint:  Chief Complaint  Patient presents with   V70.1   Psychiatric Evaluation   Visit Diagnosis:  Major Depressive Disorder, Recurrent, Severe, w/o psychotic features.   Pamela Roman is a 66 y.o. female with a history of MS, multiple falls, history of depression, hypothyroidism, hyperlipidemia, migraine headaches and osteoporosis presenting for evaluation of overdose.    The H&P notes the following: "She endorses simply feeling "lost", sad and depressed and endorses taking a full 90 tablet bottle of Zoloft 25 mg strength tablets around 7 PM this evening.  She elaborates that her husband died 4 years ago, and she was thinking how nice it would be to be able to be with him.  She denies any physical symptoms currently.  She did have an urgent bowel movement immediately after arrival, has no other complaints at this time.  She states that she has never attempted suicide in the past and states she would never do this again".  Clinician met with patient via teleassessment. Her complaint is that she overdosed. The overdose was intentional and her intentions were to end her life. States that she took a 90-day supply of her depression medications. The incident was triggered by "Feeling alone". She explains that her spouse "Pamela Roman" of 32 years passed away 4 years ago. Following his passing she moved to [place] from [place] to live with her sister. She speaks of missing her home in [place] that she eventually sold after her transition to [place].  Patient shares that she missed her spouse dearly. Recently patient decided to travel to [place] to visit his children (her step children). She was happy to see her step children. However, the visit triggered her depression  symptoms.   Upon returning home she pondered on not having any friends in [place]. She has attempted to make friends by frequenting the [facility]. However, hasn't met anyone to befriend. Additionally, she has muscle sclerosis and says that her symptoms are worsening. She complains of severe fatigue as one of those symptoms. In addition, to difficulty hearing and visual issues.  Patient has felt suicidal for several months. States that she has been thinking about how she would end her life due to all her stressors noted above.  Although, she did overdose prior to arrival to [place],  she  no longer feels suicidal. She denies a prior history of suicide attempts and/or gestures. No history of self-injurious behaviors. No access to firearms.  Current depressive symptoms include hopelessness, tearful, isolates self from others, worthlessness, and fatigue. Appetite has been poor over the last several days.  She has loss 50-60 pounds in the past 2.5 years.  Therefore, she drinks Ensure to help maintain her weight.  Her sleep routine is poor. States that she wakes up frequently throughout the night. However, she sleeps at least 7 hours per night.    Denies homicidal ideations and AVH's. She does not have a therapist currently. However, has tried online therapy in the past to help her with her husbands passing. She does not have a psychiatrist. States that her PCP prescribes her psychotropic. She is prescribed Sertraline by her PCP and has been compliant with her medications for the past 2 years. No history of inpatient psychiatric treatment. No history of alcohol and/or drug use.   She is a widow x4  years, no children. However, has 5 stepchildren by her deceased spouse. Unemployed and receives disability for her MS. No history trauma and/or abuse.  Denies that she has a family history of mental health illnesses.    Of note, at baseline patient uses a wheelchair and a rolling walker for ambulation. States that she also uses a  hearing aid. She does not have the hearing aid with her during the assessment and did have difficulty hearing the questions being asked. She lives with a sister.  She does have home health care.  CCA Screening, Triage and Referral (STR)  Patient Reported Information How did you hear about [place]? Family/Friend  What Is the Reason for Your Visit/Call Today? Pamela Roman is a 66 y.o. female with a history of MS, multiple falls, history of depression, hypothyroidism, hyperlipidemia, migraine headaches and osteoporosis presenting for evaluation of overdose.  Per H&P notes: "She endorses simply feeling "lost", sad and depressed and endorses taking a full 90 tablet bottle of Zoloft 25 mg strength tablets around 7 PM this evening.  She elaborates that her husband died 4 years ago, and she was thinking how nice it would be to be able to be with him.  She denies any physical symptoms currently.  She did have an urgent bowel movement immediately after arrival, has no other complaints at this time.  She states that she has never attempted suicide in the past and states she would never do this again".  How Long Has This Been Causing You Problems? > than 6 months  What Do You Feel Would Help You the Most Today? Treatment for Depression or other mood problem; Medication(s)   Have You Recently Had Any Thoughts About Hurting Yourself? Yes  Are You Planning to Commit Suicide/Harm Yourself At This time? Yes   Have you Recently Had Thoughts About Hurting Someone Pamela Roman? No  Are You Planning to Harm Someone at This Time? No  Explanation: No data recorded  Have You Used Any Alcohol or Drugs in the Past 24 Hours? No  How Long Ago Did You Use Drugs or Alcohol? No data recorded What Did You Use and How Much? No data recorded  Do You Currently Have a Therapist/Psychiatrist? No  Name of Therapist/Psychiatrist: No data recorded  Have You Been Recently Discharged From Any Office Practice or Programs?  No  Explanation of Discharge From Practice/Program: No data recorded    CCA Screening Triage Referral Assessment Type of Contact: Face-to-Face  Telemedicine Service Delivery:   Is this Initial or Reassessment? No data recorded Date Telepsych consult ordered in [facility]:  No data recorded Time Telepsych consult ordered in [facility]:  No data recorded Location of Assessment: [facility]  Provider Location: [facility] Assessment Services   Collateral Involvement: No data recorded  Does Patient Have a [place]? No data recorded Name and Contact of Legal Guardian: No data recorded If Minor and Not Living with Parent(s), Who has Custody? No data recorded Is CPS involved or ever been involved? Never  Is APS involved or ever been involved? Never   Patient Determined To Be At Risk for Harm To Self or Others Based on Review of Patient Reported Information or Presenting Complaint? No  Method: No data recorded Availability of Means: No data recorded Intent: No data recorded Notification Required: No data recorded Additional Information for Danger to Others Potential: No data recorded Additional Comments for Danger to Others Potential: No data recorded Are There Guns or Other Weapons  in [place]? No data recorded Types of Guns/Weapons: No data recorded Are These Weapons Safely Secured?                            No data recorded Who Could Verify You Are Able To Have These Secured: No data recorded Do You Have any Outstanding Charges, Pending Court Dates, Parole/Probation? No data recorded Contacted To Inform of Risk of Harm To Self or Others: No data recorded   Does Patient Present under Involuntary Commitment? No  IVC Papers Initial File Date: No data recorded  [place] of Residence: [place]   Patient Currently Receiving the Following Services: -- (Patient has no psychiatric services in place at this time.)   Determination of Need: Emergent (2  hours)   Options For Referral: Inpatient Hospitalization; Medication Management     CCA Biopsychosocial Patient Reported Schizophrenia/Schizoaffective Diagnosis in Past: No   Strengths: No data recorded  Mental Health Symptoms Depression:   Change in energy/activity; Fatigue; Hopelessness; Difficulty Concentrating; Increase/decrease in appetite; Tearfulness; Weight gain/loss; Worthlessness   Duration of Depressive symptoms:  Duration of Depressive Symptoms: Greater than two weeks   Mania:   None   Anxiety:    Worrying; Difficulty concentrating   Psychosis:   None   Duration of Psychotic symptoms:    Trauma:   None   Obsessions:   None   Compulsions:   None   Inattention:   None   Hyperactivity/Impulsivity:   None   Oppositional/Defiant Behaviors:   None   Emotional Irregularity:   N/A   Other Mood/Personality Symptoms:   Depressed Mood    Mental Status Exam Appearance and self-care  Stature:   Average   Weight:   Average weight   Clothing:   Neat/clean   Grooming:   Normal   Cosmetic use:   Age appropriate   Posture/gait:   Normal   Motor activity:   Not Remarkable   Sensorium  Attention:   Normal   Concentration:   Normal   Orientation:   Time; Situation; Place; Person; Object   Recall/memory:   Normal   Affect and Mood  Affect:   Depressed; Flat   Mood:   Depressed   Relating  Eye contact:   Normal   Facial expression:   Depressed; Sad   Attitude toward examiner:   Cooperative   Thought and Language  Speech flow:  Clear and Coherent   Thought content:  No data recorded  Preoccupation:   Suicide   Hallucinations:   None   Organization:  No data recorded  [employer] of Knowledge:   Fair   Intelligence:   Average   Abstraction:   Normal   Judgement:   Impaired   Reality Testing:   Adequate   Insight:   Fair   Decision Making:   Impulsive   Social Functioning   Social Maturity:   Responsible   Social Judgement:   Normal   Stress  Stressors:   Grief/losses; Transitions   Coping Ability:   Programme researcher, broadcasting/film/video Deficits:   Self-care   Supports:   Family     Religion: Religion/Spirituality Are You A Religious Person?: No  Leisure/Recreation: Leisure / Recreation Do You Have Hobbies?: No  Exercise/Diet: Exercise/Diet Do You Exercise?: Yes How Many Times a Week Do You Exercise?: 1-3 times a week Have You Gained or Lost A Significant Amount of Weight in the Past Six Months?: Yes-Lost  Number of Pounds Lost?:  (50-60 pounds in the past 2 years.) Do You Follow a Special Diet?: No Do You Have Any Trouble Sleeping?: Yes Explanation of Sleeping Difficulties: .  Her sleep routine is poor. States that she wakes up frequently throughout the night. However, she sleeps at least 7 hours per night.   CCA Employment/Education Employment/Work Situation: Employment / Work Technical sales engineer: On disability Why is Patient on Disability: MS diagnosis How Long has Patient Been on Disability: Since the age of 66 years old. Has Patient ever Been in the [employer]?: No  Education: Education Is Patient Currently Attending School?: No Did You Attend College?:  (unkown) Did You Have An Individualized Education Program (IIEP): No Did You Have Any Difficulty At School?: No Patient's Education Has Been Impacted by Current Illness: No   CCA Family/Childhood History Family and Relationship History: Family history Marital status: Widowed Widowed, when?: Her spouse "Pamela Roman" of 32 years passed away 4 years ago. Does patient have children?: No (No biological. However, has 5 step children.)  Childhood History:  Childhood History By whom was/is the patient raised?: Mother Did patient suffer any verbal/emotional/physical/sexual abuse as a child?: No Did patient suffer from severe childhood neglect?: No Has patient ever been sexually  abused/assaulted/raped as an adolescent or adult?: No Was the patient ever a victim of a crime or a disaster?: No Witnessed domestic violence?: No Has patient been affected by domestic violence as an adult?: No  Child/Adolescent Assessment:     CCA Substance Use Alcohol/Drug Use: Alcohol / Drug Use Pain Medications: SEE MAR Prescriptions: SEE MAR Over the Counter: SEE MAr History of alcohol / drug use?: No history of alcohol / drug abuse                         ASAM's:  Six Dimensions of Multidimensional Assessment  Dimension 1:  Acute Intoxication and/or Withdrawal Potential:      Dimension 2:  Biomedical Conditions and Complications:      Dimension 3:  Emotional, Behavioral, or Cognitive Conditions and Complications:     Dimension 4:  Readiness to Change:     Dimension 5:  Relapse, Continued use, or Continued Problem Potential:     Dimension 6:  Recovery/Living Environment:     ASAM Severity Score:    ASAM Recommended Level of Treatment:     Substance use Disorder (SUD)    Recommendations for Services/Supports/Treatments: Recommendations for Services/Supports/Treatments Recommendations For Services/Supports/Treatments: Medication Management, Inpatient Hospitalization, Partial Hospitalization, Individual Therapy  Discharge Disposition:    DSM5 Diagnoses: Patient Active Problem List   Diagnosis Date Noted   Encounter for examination following treatment at hospital 07/16/2021   Pharyngoesophageal dysphagia 03/12/2021   Migraine 08/09/2020   Hypothyroidism 08/09/2020   HLD (hyperlipidemia) 08/09/2020   Osteoporosis 08/09/2020   Multiple falls 08/09/2020   Weight loss 08/09/2020   Depression, major, single episode, moderate ([place]) 08/09/2020   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

## 2022-02-11 NOTE — ED Provider Notes (Signed)
  Physical Exam  BP (!) 162/82   Pulse 99   Resp 18   Ht 5\' 3"  (1.6 m)   Wt 58.1 kg   SpO2 97%   BMI 22.69 kg/m   Physical Exam Vitals and nursing note reviewed.  Constitutional:      General: She is not in acute distress.    Appearance: She is well-developed.  HENT:     Head: Normocephalic and atraumatic.  Eyes:     Conjunctiva/sclera: Conjunctivae normal.  Cardiovascular:     Rate and Rhythm: Normal rate and regular rhythm.     Heart sounds: No murmur heard. Pulmonary:     Effort: Pulmonary effort is normal. No respiratory distress.  Musculoskeletal:        General: No swelling.     Cervical back: Neck supple.  Skin:    General: Skin is warm and dry.     Capillary Refill: Capillary refill takes less than 2 seconds.  Neurological:     Mental Status: She is alert.  Psychiatric:        Mood and Affect: Mood normal.     Procedures  Procedures  ED Course / MDM   Clinical Course as of 02/11/22 0614  Tue Feb 10, 2022  2052 She is brought in from home via EMS after an ingestion of a bottle of Zoloft around 7 PM tonight.  She is awake and alert.  Getting lab work monitoring and consult to poison control.  Disposition per results of testing. [MB]  2336 OD on 90 zoloft tabs, med clear at 0300 [MK]    Clinical Course User Index [MB] 2337, MD [MK] Livio Ledwith, Terrilee Files, MD   Medical Decision Making Amount and/or Complexity of Data Reviewed Labs: ordered.  Risk OTC drugs.   Patient received in handoff.  Intentional Zoloft ingestion.  Plan is for medical clearance at 3 AM if no symptoms.  Patient with no symptoms overnight and patient currently is medically clear for psychiatric evaluation.       Wyn Forster, MD 02/11/22 [phone]

## 2022-02-11 NOTE — BH Assessment (Signed)
@   2233, requested patient's nurse Dorathy Daft, RN) to set up the TTS machine.

## 2022-02-12 NOTE — ED Notes (Signed)
Pt says shes feeling much better that she no longer needs the purewick and is able to get up to the bedside commode.

## 2022-02-12 NOTE — Progress Notes (Signed)
Pt was accepted to [facility] Today 02/12/22; Bed Assignment Erlene Quan  Pt meets inpatient criteria per Chinwendu Royston Bake, NP  Attending Physician will be Dr. Forrestine Him   Report can be called to: [phone] or [phone]  Pt can arrive: BED IS READY  Care Team notified: Gerlean Ren, RN, and Hart Rochester, NT.  Kelton Pillar, LCSWA 02/12/2022 @ 1:39 PM

## 2022-02-12 NOTE — Progress Notes (Signed)
Inpatient Behavioral Health Placement  Pt meets inpatient criteria per Chinwendu Royston Bake, NP. There are no available bed at this time at [facility].  Referral was sent to the following facilities;    Destination Service Provider Address Phone Fax  [facility]  [address], [place] [address] [phone]  [facility] [address]  [address] [place] [address] [phone]  [facility]  [address] [place] [address] [phone]  [facility]  [address] [place], [place] [address] [phone]  [facility]  [address] [place], [place] [address] [phone]  [facility]  [address] [place] [address] [phone]  [facility]  [address], [place] [address] [phone]  [facility]  [address] [place] [address] [phone]  [facility]  [address], [place] [address] [phone]  [facility]  [address] [place] [address] [phone]  [facility]  [address] [place] [address] [phone]  [facility]  [address] [place] [address] [phone]  [facility]  [address] [place], [place] [address] [phone]  [facility]  [address], [place] [address] [phone]  [facility]  [address] [place] [address] [phone]   Situation ongoing,  CSW will follow up.   Maryjean Ka, MSW, [facility] 02/12/2022  @ 12:29 PM

## 2022-02-12 NOTE — ED Notes (Signed)
Pt finished lunch.  Assisted pt in standing/taking a few steps to make sure pt had no pain or issues from an earlier fall. Pt has no complaints of pain at this time. Pt is unsteady and needs assistance in standing and taking steps.  Pt is relaxing in bed with lights off watching tv.  Fresh water an snack at bedside.

## 2022-02-12 NOTE — Progress Notes (Signed)
CSW has requested that [facility] review per discussion in morning. CSW will assist and follow with placement.     Maryjean Ka, MSW, [facility] 02/12/2022 10:56 AM

## 2022-02-12 NOTE — ED Notes (Signed)
Pt resting in bed, no complaints of pain. Fresh water at bedside.

## 2022-02-12 NOTE — ED Notes (Signed)
[facility] called to transport to [facility] at this time.

## 2022-02-12 NOTE — ED Notes (Signed)
Pt asked to use the bathroom. noticed pt was a little unsteady so pt was asked to remain seated until I got a wheelchair. As I approached the pts room with the wheelchair the pt. Stood up and lost her balance resulting to falling on her bottom. Pt was assisted from the floor and into wheelchair. No complaints of pain anywhere at this time. Nurse was notified and vitals gotten.

## 2022-02-12 NOTE — ED Notes (Signed)
Pt

## 2022-02-16 ENCOUNTER — Telehealth: Payer: Self-pay | Admitting: Internal Medicine

## 2022-02-16 NOTE — Telephone Encounter (Signed)
[facility] called in on patient behalf.   Patient is expected to DC on 02/17/22.   TOC appt made for 8/23   [facility] call needed    [employer] [phone]

## 2022-02-17 ENCOUNTER — Ambulatory Visit: Payer: Medicare Other | Admitting: Neurology

## 2022-02-18 ENCOUNTER — Telehealth: Payer: Self-pay | Admitting: Internal Medicine

## 2022-02-18 ENCOUNTER — Telehealth: Payer: Self-pay | Admitting: *Deleted

## 2022-02-18 ENCOUNTER — Telehealth: Payer: Self-pay | Admitting: Neurology

## 2022-02-18 NOTE — Telephone Encounter (Signed)
TOC call made 

## 2022-02-18 NOTE — Telephone Encounter (Signed)
Transition Care Management Follow-up Telephone Call Date of discharge and from where: 02-17-22 [facility] How have you been since you were released from the hospital? Doing ok  Any questions or concerns? Yes  Items Reviewed: Did the pt receive and understand the discharge instructions provided? Yes  Medications obtained and verified? Yes  Other? No  Any new allergies since your discharge? No  Dietary orders reviewed? Yes Do you have support at home? Yes   Home Care and Equipment/Supplies: Were home health services ordered? not applicable If so, what is the name of the agency? NA  Has the agency set up a time to come to the patient's home? not applicable Were any new equipment or medical supplies ordered?  No What is the name of the medical supply agency? NA Were you able to get the supplies/equipment? not applicable Do you have any questions related to the use of the equipment or supplies? No  Functional Questionnaire: (I = Independent and D = Dependent) ADLs: i  Bathing/Dressing- i  Meal Prep- i  Eating- i  Maintaining continence- i  Transferring/Ambulation- i  Managing Meds- i  Follow up appointments reviewed:  PCP Hospital f/u appt confirmed? Yes  Scheduled to see 8-23 on [place] . Specialist Hospital f/u appt confirmed? Yes   Are transportation arrangements needed? No  If their condition worsens, is the pt aware to call PCP or go to the Emergency Dept.? Yes Was the patient provided with contact information for the PCP's office or ED? Yes Was to pt encouraged to call back with questions or concerns? Yes

## 2022-02-18 NOTE — Telephone Encounter (Signed)
Spoke with patient she is afraid to take this as she took to many of these which is why she ended up there she does follow up with psych 8-28 and has TOC scheduled with you 8-23 please advise

## 2022-02-18 NOTE — Telephone Encounter (Signed)
[facility], [phone]  Called stating pt is afraid to take the sertraline (ZOLOFT) 25 MG tablet. Her dosage was increased to 50mg  when she was discharged. She states pt is afraid to take this medication because she thinks it is what got her in trouble to begin with. Please advise.

## 2022-02-18 NOTE — Telephone Encounter (Signed)
Patient called and asked if she could have a return phone call.  She was scheduled to see Dr yesterday, had to cancel appt.  She wants to go over some things.

## 2022-02-18 NOTE — Telephone Encounter (Signed)
Patient advised with verbal understanding  

## 2022-02-19 NOTE — Telephone Encounter (Signed)
Called patient and she reported she missed her appointment due to going to the hospital because she tried to kill herself and then going to rehab after. She was unable to get to phone. She would like to get an appointment to be able to get the wheel chair that she needs to be able to get around the house better. I encouraged to call us or someone if her ever fills that way again. Hospital set up an appointment with Phychiatrist..Marland Kitchen

## 2022-02-20 NOTE — Progress Notes (Unsigned)
NEUROLOGY FOLLOW UP OFFICE NOTE  Pamela Roman [id number]  Assessment/Plan:   1.  Multiple sclerosis 2.  Migraine without aura 3.  Pseudoexacerbation related to heat and overexertion  4.  Bowel and bladder dysfunction - no changes on MRI of brain and spinal cord to explain sudden incontinence.   1.  DMT:  Mayzent 1mg  daily 2.  Migraine prevention:  zonisamide 100mg  QD 3.  Migraine rescue:  Sumatriptan 100mg  4.  Neuropathic pain:  Gabapentin 100mg  TID 5.  D3 50,000 IU every 7 days.   6.  Repeat CBC with diff, CMP and vitamin D in 6 months 7   Would advise to follow up with PCP to first evaluate for other causes of incontinence 8   Follow up in 6 months (after repeat labs and MRI)   Subjective:  Pamela Roman is a 66 year old left-handed white female who follows up for multiple sclerosis and assessment for power chair.  She is accompanied by her sister who supplements history.   UPDATE: Current DMT:  Mayzent 1mg  daily (since July 2021). Other current medications:  Sertraline 25mg  daily; sumatriptan 100mg  PRN; D 50000 IU weekly; levothyroxine; Aleve BID for pain, gabapentin 100mg  TID; zonisamide 100mg  QD  Sometimes can't get to the personal trainer - works on upper body at least     Endorsed worsening weakness and balance. MRIs personally reviewed:    Vision:  If she looks at distance, she sees horizontal double vision.  Trouble writing and reading.  She had cataract surgery in 2021 and it didn't improve.  Likely related to INO. Motor: Felt weakness in legs may be due to gabapentin, so she discontinued gabapentin.  Continues to feel weak in legs with trouble walking.  Recently went on a trip to [place] to see family.  Worse during and after the trip. Weather has been hot Sensory:  Numbness and tingling in her calves and feet.  NCV-EMG of lower extremities on 09/23/2021 showed bilateral chronic L3-4 radiculopathy  Pain:  Low back pain.  Left hip pain.  Bilateral sharp stabbing pain  in bilateral thighs.  She has degenerative disc disease of lumbar spine. Treats pain with Aleve.  She reports increased lumbar spinal pain recently and notes a protrusion as well.  At first she thought it was related to weight loss exposing her spine but is not sure.  She is concerned.  She does have history of vertebral fractures.  MRI Lumbar spine on 11/15/2020 showed multilevel spondylosis with moderate spinal canal stenosis and severe bilateral lateral recess stenosis at L4-L5 as well as chronic compression deformities of T11, L1, and L2, including severe right foraminal stenosis at L2-3.  Advised that if she has been experiencing lower extremity pain and weakness, then would refer to spine specialist.  She denies lower extremity pain however (mostly just localized low back pain) Headaches: Severe pressure-like bi-temporal/retro-orbital pain with nausea.  Responds to sumatriptan.  Increased frequency as above.   Gait:  Progressive gait decline.  Uses lift power scooter.  She needs assistance/walker now to ambulate.   Bowel/Bladder:  Reports both bowel and bladder incontinence.  Fairly new. Fatigue:  yes Cognition:  Word finding issues.   Mood:  Some depression.   Sister reportedly thinks she is very depressed and has a short-temper.   Migraines:  Started zonisamide in September.  Now infrequent  Aborts quickly with sumatriptan. On disability     HISTORY: She was diagnosed with multiple sclerosis in the early 1990s after experiencing  right optic neuritis.  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  She underwent LP and MRIs, which were consistent with MS.  She initially started Betaseron and then Avonex until 2011, after which she was switched to Tysabri.  Within the last 5 years, she has had progression of disease, but particularly worse since moving here in March.  She has fallen 4 times.  She started using a walker about 5 years ago.     She had an MS flare in May 2021, describing  increased difficulty walking and bilateral lower extremity numbness and tingling.  For paresthesias, she was started on gabapentin.  MRI of spinal cord showed acute lesions.  Started Mayzent in July 2021 but patient noted increased paresthesias in the legs.  She was prescribed high-dose prednisone.  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 She then was receiving Solu-Medrol every month. Other past medications: Gabapentin (weakness)   Imaging: 12/31/2021 MRI BRAIN W WO:  1. Findings consistent with widespread chronic demyelinating disease/multiple sclerosis. Overall, disease burden is not significantly changed from previous, with no definite new lesions. No evidence for active demyelination on today's exam. 2. Underlying mildly advanced cerebral atrophy for age. 12/31/2021 MRI C-SPINE W WO:  1. No significant interval change in extensive patchy signal abnormality throughout the cervical spinal cord, consistent with history of chronic multiple sclerosis. No new lesions or significant disease progression. No evidence for active demyelination.  2. New mild chronic compression deformity at the superior endplate of T3.  3. Moderate spondylosis at C5-6 and C6-7 without significant stenosis. 12/31/2021 MRI T-SPINE W WO:  1. Diffuse patchy and hazy signal abnormality involving the majority of the thoracic cord, most pronounced at the level of T6-7. In comparison with prior exam, these changes are likely similar, although prior exam is degraded by motion, consistent with history of chronic demyelinating disease/multiple sclerosis. Overall, appearance is similar to prior, without evidence for significant disease progression. No definite new lesions. No active demyelination. 2. New chronic compression deformities involving the T3, T5, and T6 vertebral bodies. Additional chronic compression fractures at T11  through L2 are stable. 09/16/2020 MRI BRAIN W WO:  stable with resolution of previous small active lesion 09/09/2020 MRI C & T-SPINE W WO:  1. Unchanged distribution of demyelinating lesions throughout the cervical spinal cord. Motion degraded images of the thoracic spine,but grossly unchanged. 2. No evidence of active demyelination within the cervical or thoracic spine.  04/14/2020 MRI BRAIN W WO:  subcentimeter focus of enhancement within the left posterior medulla, consistent with active demyelination. 12/23/2019 MRI CERVICAL SPINE W WO:  1. Extensive patchy T2 signal abnormality throughout the cervical spinal cord, consistent with demyelinating disease/multiple sclerosis. Faint patchy postcontrast enhancement about a few lesions.  AT C4 AND C7, consistent with active demyelination.  2. Mild multilevel cervical spondylosis without significant spinal stenosis. Mild bilateral C6 foraminal narrowing. 12/23/2019 MRI THORACIC SPINE W WO:  1. Patchy signal abnormality throughout much of the thoracic spinal cord, consistent with demyelinating disease/multiple sclerosis. No evidence for active demyelination within the thoracic spinal cord.  2. Acute to subacute compression fracture extending through the inferior endplate of L2 with up to 40% height loss and 4 mm of bony retropulsion, incompletely assessed on this exam. Finding could be further evaluated with dedicated MRI of the lumbar spine as clinically desired.  3. Additional chronic lower thoracic compression  fractures as above. 07/24/2019 MRI BRAIN W WO:  1.  Bilateral periventricular and radial white matter hyperintensities involving the frontal parietal and temporal regions bilaterally stable compared to 04/25/19.  2.  Compared to prior study the white matter hyperintensities, T1 low signal foci associated with these, and the absence of contrast enhancement appears stable.  3.  No evidence of new plaques or contrast enhancement to suggest new inflammatory  foci. 04/25/2019 MRI BRAIN W WO:  CEREBRUM:  Extensive multiple sclerosis plaques are visualized.  These is seen adjacent to the tmporal horns bilaterally extending to surround the atria and occipital horns bilaterally and extensively throughout the periventricular white matter bilateral.  These also extend into the bifrontal corona radiata and centrum semiovale.  No involvement of brainstem or cerebellum.  PAST MEDICAL HISTORY: Past Medical History:  Diagnosis Date   Depression    Phreesia 08/06/2020   Hx of migraines    Hyperglycemia    Hypothyroidism    Migraines    Multiple sclerosis (HCC)     MEDICATIONS: Current Outpatient Medications on File Prior to Visit  Medication Sig Dispense Refill   alendronate (FOSAMAX) 70 MG tablet TAKE 1 TABLET ONCE A WEEK WITH A FULL GLASS OF WATER ON AN EMPTY STOMACH (Patient taking differently: Take 70 mg by mouth once a week.) 12 tablet 3   diazepam (VALIUM) 5 MG tablet TAKE 1 TABLET BY MOUTH 30 TO 40 MINUTES BEFORE MRI 2 tablet 0   levothyroxine (SYNTHROID) 50 MCG tablet TAKE 1 TABLET DAILY BEFORE BREAKFAST 90 tablet 3   sertraline (ZOLOFT) 25 MG tablet TAKE 1 TABLET DAILY 90 tablet 3   simvastatin (ZOCOR) 20 MG tablet TAKE 1 TABLET DAILY 90 tablet 3   Siponimod Fumarate (MAYZENT) 1 MG TABS TAKE 1 TABLET (1 MG) DAILY 90 tablet 0   SUMAtriptan (IMITREX) 100 MG tablet TAKE 1 TABLET EVERY 2 HOURS AS NEEDED FOR MIGRAINE. MAY REPEAT IN 2 HOURS IF HEADACHE PERSISTS OR RECURS (Patient taking differently: Take 100 mg by mouth every 2 (two) hours as needed for migraine.) 27 tablet 0   Vitamin D, Ergocalciferol, (DRISDOL) 1.25 MG (50000 UNIT) CAPS capsule TAKE 1 CAPSULE EVERY 7 DAYS 5 capsule 9   zonisamide (ZONEGRAN) 100 MG capsule TAKE 1 CAPSULE DAILY 30 capsule 0   No current facility-administered medications on file prior to visit.    ALLERGIES: Allergies  Allergen Reactions   Codeine Nausea And Vomiting    FAMILY HISTORY: Family History   Problem Relation Age of Onset   Dementia Mother    Dementia Father       Objective:  Blood pressure (!) 146/83, pulse 71, height 5\' 3"  (1.6 m), weight 128 lb (58.1 kg), SpO2 99 %. General: No acute distress.  Patient appears well-groomed.   Head:  Normocephalic/atraumatic Eyes:  Fundi examined but not visualized Neck: supple, no paraspinal tenderness, full range of motion Heart:  Regular rate and rhythm Back: No paraspinal tenderness Neurological Exam: alert and oriented to person, place, and time.  Speech fluent and not dysarthric, language intact.  Right APD, right INO.  Otherwise, CN II-XII intact. Muscle strength 4+/5 right hip flexion, 3+/5 left hip flexion, 4+/5 right ankle dorsiflexion.  Sensation to pinprick and vibration slightly reduced in right upper and lower extremities.  DTR 2+ throughout, toes downgoing.  Finger to nose testing with trace ataxia but able to meet my fingertip.  In wheelchair.  Able to stand.  Broad-based stance.  Requires assistance to ambulate.    Everlena Cooper, DO  CC: Trena Platt, MD

## 2022-02-23 ENCOUNTER — Telehealth: Payer: Self-pay | Admitting: Neurology

## 2022-02-23 ENCOUNTER — Ambulatory Visit (INDEPENDENT_AMBULATORY_CARE_PROVIDER_SITE_OTHER): Payer: Medicare Other | Admitting: Neurology

## 2022-02-23 ENCOUNTER — Encounter: Payer: Self-pay | Admitting: Neurology

## 2022-02-23 VITALS — BP 148/80 | HR 76 | Ht 63.0 in | Wt 128.0 lb

## 2022-02-23 DIAGNOSIS — G35 Multiple sclerosis: Secondary | ICD-10-CM | POA: Diagnosis not present

## 2022-02-23 NOTE — Telephone Encounter (Signed)
Order faxed for pt

## 2022-02-23 NOTE — Patient Instructions (Addendum)
Find out which company you want to use for the power chair and have them send me a paper with what needs to be documented in my note Will get Home Health  Until then, follow up as scheduled in January

## 2022-02-23 NOTE — Telephone Encounter (Signed)
[employer] on [address] [phone] Fax: [phone]  Patient stated the company above will work with Medicare to help her get a wheelchair. They will need a faxed order to get started.

## 2022-02-25 ENCOUNTER — Ambulatory Visit (INDEPENDENT_AMBULATORY_CARE_PROVIDER_SITE_OTHER): Payer: Medicare Other | Admitting: Internal Medicine

## 2022-02-25 ENCOUNTER — Encounter: Payer: Self-pay | Admitting: Internal Medicine

## 2022-02-25 VITALS — BP 134/76 | HR 81 | Ht 63.0 in | Wt 126.2 lb

## 2022-02-25 DIAGNOSIS — F331 Major depressive disorder, recurrent, moderate: Secondary | ICD-10-CM

## 2022-02-25 DIAGNOSIS — Z09 Encounter for follow-up examination after completed treatment for conditions other than malignant neoplasm: Secondary | ICD-10-CM

## 2022-02-25 DIAGNOSIS — R296 Repeated falls: Secondary | ICD-10-CM

## 2022-02-25 DIAGNOSIS — G35 Multiple sclerosis: Secondary | ICD-10-CM

## 2022-02-25 DIAGNOSIS — R5381 Other malaise: Secondary | ICD-10-CM

## 2022-02-25 DIAGNOSIS — E039 Hypothyroidism, unspecified: Secondary | ICD-10-CM

## 2022-02-25 NOTE — Progress Notes (Signed)
Established Patient Office Visit  Subjective:  Patient ID: Pamela Roman, female    DOB: 10-11-1955  Age: 66 y.o. MRN: [id number]  CC:  Chief Complaint  Patient presents with   Hospitalization Follow-up    TOC d/c 02/17/22 intentional overdose on zoloft feeling better now still sad, unsure if she wants to refill medication.    HPI Pamela Roman is a 66 y.o. female with past medical history of progressive multiple sclerosis, physical deconditioning, osteoporosis, multiple falls, hypothyroidism and depression who presents for f/u after recent hospitalization for suicidal attempt.  She went to ER on 02/10/2022 after intentional overdose of Zoloft.  She was kept in observation for 2 days and was later transferred to inpatient psychiatry unit for suicidal ideations.  She was discharged from [facility] facility on 02/17/2022.  Her dose of Zoloft was actually increased to 50 mg daily for uncontrolled depression/adjustment disorder.  Of note, she has not been taking Zoloft, as she thinks that it is what she had overdosed.  She prefers to do [facility] therapy alone for depression for now.  She denies any SI or HI currently.  MS: She recently had MRI of brain and thoracic lumbar spine.   She takes Mayzent for MS and follows up with neurologist. She reports having multiple falls in the past. She complains of recent worsening of fatigue and leg weakness. She has difficulty ambulating in her home due to progressive leg weakness.  She is able to operate wheelchair and is willing to operate a powered wheelchair for better mobility and to reduce her risk of recurrent falls.    Past Medical History:  Diagnosis Date   Depression    Phreesia 08/06/2020   Hx of migraines    Hyperglycemia    Hypothyroidism    Migraines    Multiple sclerosis ([place])     Past Surgical History:  Procedure Laterality Date   CATARACT EXTRACTION W/PHACO Left 01/01/2020   Procedure: CATARACT EXTRACTION PHACO AND  INTRAOCULAR LENS PLACEMENT LEFT EYE;  Surgeon: Baruch Goldmann, MD;  Location: [facility];  Service: Ophthalmology;  Laterality: Left;  CDE: 3.37   CATARACT EXTRACTION W/PHACO Right 01/19/2020   Procedure: CATARACT EXTRACTION PHACO AND INTRAOCULAR LENS PLACEMENT RIGHT EYE;  Surgeon: Baruch Goldmann, MD;  Location: [facility];  Service: Ophthalmology;  Laterality: Right;  CDE: 3.16   TONSILLECTOMY      Family History  Problem Relation Age of Onset   Dementia Mother    Dementia Father     Social History   Socioeconomic History   Marital status: Widowed    Spouse name: Not on file   Number of children: Not on file   Years of education: Not on file   Highest education level: Not on file  Occupational History   Not on file  Tobacco Use   Smoking status: Never   Smokeless tobacco: Never   Tobacco comments:    Lived with smoker  Vaping Use   Vaping Use: Never used  Substance and Sexual Activity   Alcohol use: Not Currently   Drug use: Never   Sexual activity: Not Currently  Other Topics Concern   Not on file  Social History Narrative   ** Merged History Encounter ** Lives with her sister two story home stays on the first floor   Left handed   Caffeine none   Social Determinants of Health   Financial Resource Strain: Low Risk  (11/24/2021)   Overall Financial Resource Strain (CARDIA)  Difficulty of Paying Living Expenses: Not hard at all  Food Insecurity: No Food Insecurity (11/24/2021)   Hunger Vital Sign    Worried About Running Out of Food in the Last Year: Never true    Ran Out of Food in the Last Year: Never true  Transportation Needs: No Transportation Needs (11/24/2021)   PRAPARE - Hydrologist (Medical): No    Lack of Transportation (Non-Medical): No  Physical Activity: Insufficiently Active (11/24/2021)   Exercise Vital Sign    Days of Exercise per Week: 4 days    Minutes of Exercise per Session: 30 min  Stress: No Stress Concern Present  (11/18/2020)   [place]    Feeling of Stress : Not at all  Social Connections: Moderately Integrated (11/24/2021)   Social Connection and Isolation Panel [NHANES]    Frequency of Communication with Friends and Family: More than three times a week    Frequency of Social Gatherings with Friends and Family: More than three times a week    Attends Religious Services: More than 4 times per year    Active Member of [employer] or Organizations: Yes    Attends Archivist Meetings: 1 to 4 times per year    Marital Status: Widowed  Human resources officer Violence: Not on file    Outpatient Medications Prior to Visit  Medication Sig Dispense Refill   alendronate (FOSAMAX) 70 MG tablet TAKE 1 TABLET ONCE A WEEK WITH A FULL GLASS OF WATER ON AN EMPTY STOMACH (Patient taking differently: Take 70 mg by mouth once a week.) 12 tablet 3   diazepam (VALIUM) 5 MG tablet TAKE 1 TABLET BY MOUTH 30 TO 40 MINUTES BEFORE MRI 2 tablet 0   levothyroxine (SYNTHROID) 50 MCG tablet TAKE 1 TABLET DAILY BEFORE BREAKFAST 90 tablet 3   sertraline (ZOLOFT) 25 MG tablet TAKE 1 TABLET DAILY 90 tablet 3   simvastatin (ZOCOR) 20 MG tablet TAKE 1 TABLET DAILY 90 tablet 3   Siponimod Fumarate (MAYZENT) 1 MG TABS TAKE 1 TABLET (1 MG) DAILY 90 tablet 0   SUMAtriptan (IMITREX) 100 MG tablet TAKE 1 TABLET EVERY 2 HOURS AS NEEDED FOR MIGRAINE. MAY REPEAT IN 2 HOURS IF HEADACHE PERSISTS OR RECURS (Patient taking differently: Take 100 mg by mouth every 2 (two) hours as needed for migraine.) 27 tablet 0   Vitamin D, Ergocalciferol, (DRISDOL) 1.25 MG (50000 UNIT) CAPS capsule TAKE 1 CAPSULE EVERY 7 DAYS 5 capsule 9   zonisamide (ZONEGRAN) 100 MG capsule TAKE 1 CAPSULE DAILY 30 capsule 0   No facility-administered medications prior to visit.    Allergies  Allergen Reactions   Codeine Nausea And Vomiting    ROS Review of Systems  Constitutional:  Negative for chills  and fever.  HENT:  Negative for congestion, sinus pain, sore throat and voice change.   Eyes:  Positive for visual disturbance. Negative for pain.  Respiratory:  Negative for cough and shortness of breath.   Cardiovascular:  Negative for chest pain and palpitations.  Gastrointestinal:  Negative for constipation, diarrhea, nausea and vomiting.  Endocrine: Negative for polydipsia and polyuria.  Genitourinary:  Negative for dysuria and hematuria.  Musculoskeletal:  Positive for arthralgias, back pain and gait problem. Negative for neck pain and neck stiffness.  Skin:  Negative for rash.  Neurological:  Positive for weakness and numbness. Negative for dizziness.  Psychiatric/Behavioral:  Negative for agitation and behavioral problems.  Objective:    Physical Exam Vitals reviewed.  Constitutional:      General: She is not in acute distress.    Appearance: She is not diaphoretic.     Comments: Has a rolling walker  HENT:     Head: Normocephalic and atraumatic.     Nose: Nose normal.     Mouth/Throat:     Mouth: Mucous membranes are moist.  Eyes:     General: No scleral icterus.    Extraocular Movements: Extraocular movements intact.  Cardiovascular:     Rate and Rhythm: Normal rate and regular rhythm.     Pulses: Normal pulses.     Heart sounds: Normal heart sounds. No murmur heard. Pulmonary:     Breath sounds: Normal breath sounds. No wheezing or rales.  Musculoskeletal:     Cervical back: Neck supple. No tenderness.     Right lower leg: No edema.     Left lower leg: No edema.  Skin:    General: Skin is warm.     Findings: No rash.  Neurological:     General: No focal deficit present.     Mental Status: She is alert and oriented to person, place, and time.     Sensory: Sensory deficit (B/l LE) present.     Motor: Weakness (B/l UE and LE - 3/5) present.  Psychiatric:        Mood and Affect: Mood normal.        Behavior: Behavior normal.     BP 134/76 (BP  Location: Left Arm, Cuff Size: Normal)   Pulse 81   Ht 5' 3"  (1.6 m)   Wt 126 lb 3.2 oz (57.2 kg)   SpO2 96%   BMI 22.36 kg/m  Wt Readings from Last 3 Encounters:  02/25/22 126 lb 3.2 oz (57.2 kg)  02/23/22 128 lb (58.1 kg)  02/10/22 128 lb 1.4 oz (58.1 kg)    Lab Results  Component Value Date   TSH 2.230 07/16/2021   Lab Results  Component Value Date   WBC 10.3 02/10/2022   HGB 13.9 02/10/2022   HCT 42.6 02/10/2022   MCV 95.5 02/10/2022   PLT 240 02/10/2022   Lab Results  Component Value Date   NA 141 02/10/2022   K 3.3 (L) 02/10/2022   CO2 26 02/10/2022   GLUCOSE 178 (H) 02/10/2022   BUN 27 (H) 02/10/2022   CREATININE 0.78 02/10/2022   BILITOT 0.7 02/10/2022   ALKPHOS 106 02/10/2022   AST 31 02/10/2022   ALT 30 02/10/2022   PROT 7.4 02/10/2022   ALBUMIN 4.3 02/10/2022   CALCIUM 9.3 02/10/2022   ANIONGAP 7 02/10/2022   EGFR 96 07/16/2021   GFR 79.97 06/05/2021   Lab Results  Component Value Date   CHOL 200 (H) 11/06/2020   Lab Results  Component Value Date   HDL 54 11/06/2020   Lab Results  Component Value Date   LDLCALC 123 (H) 11/06/2020   Lab Results  Component Value Date   TRIG 129 11/06/2020   Lab Results  Component Value Date   CHOLHDL 3.7 11/06/2020   No results found for: "HGBA1C"    Assessment & Plan:   Problem List Items Addressed This Visit       Endocrine   Hypothyroidism    On Levothyroxine 50 mcg QD        Nervous and Auditory   Multiple sclerosis (HCC)    On Mayzent 1 mg QD Progressive neurologic deficits Has paresthesia in  the legs, physical deconditioning leading to wheelchair/rolling walker use Follows up with Neurologist - last visit note reviewed      Relevant Orders   For home use only DME Wheelchair electric   AMB Referral to [facility] [place]     Other   Multiple falls    Due to progressive MS leading to physical deconditioning Had PT Uses walker and scooter for ambulation, but still has  falls Would benefit from powered wheelchair to reduce fall risk      Relevant Orders   For home use only DME Wheelchair electric   Physical deconditioning    Due to MS related physical deconditioning and lack of mobility Dependent for ADLs  Has used manual wheelchair, but needs an electric wheelchair for better mobility in her home independently, which will improve her quality of life  She can safely use electric wheelchair. She is willing and motivated to use the power mobility device in the home.      Relevant Orders   For home use only DME Wheelchair electric   Hospital discharge follow-up    Hospital chart reviewed, including discharge summary Medications reconciled and reviewed with the patient in detail.      MDD (major depressive disorder), recurrent episode, moderate ([place]) - Primary    Had recent suicidal attempt with Zoloft overdose Was admitted at [facility] behavioral health facility Was placed on Zoloft 50 mg daily, but does not prefer to take it Referred to [facility] for [facility] therapy      Relevant Orders   AMB Referral to [place]    No orders of the defined types were placed in this encounter.   Follow-up: Return if symptoms worsen or fail to improve.    Lindell Spar, MD

## 2022-02-25 NOTE — Patient Instructions (Addendum)
Please continue taking medications as prescribed.  We will try to involve behavioral health therapy for depression.  Please considered Shingrix and Tdap vaccine at local pharmacy.

## 2022-02-25 NOTE — Assessment & Plan Note (Signed)
On Levothyroxine 50 mcg QD

## 2022-02-25 NOTE — Assessment & Plan Note (Signed)
Due to progressive MS leading to physical deconditioning Had PT Uses walker and scooter for ambulation, but still has falls Would benefit from powered wheelchair to reduce fall risk

## 2022-02-25 NOTE — Assessment & Plan Note (Signed)
Due to MS related physical deconditioning and lack of mobility Dependent for ADLs  Has used manual wheelchair, but needs an electric wheelchair for better mobility in her home independently, which will improve her quality of life  She can safely use electric wheelchair. She is willing and motivated to use the power mobility device in the home.

## 2022-02-25 NOTE — Assessment & Plan Note (Signed)
On Mayzent 1 mg QD Progressive neurologic deficits Has paresthesia in the legs, physical deconditioning leading to wheelchair/rolling walker use Follows up with Neurologist - last visit note reviewed 

## 2022-02-25 NOTE — Assessment & Plan Note (Signed)
Hospital chart reviewed, including discharge summary Medications reconciled and reviewed with the patient in detail 

## 2022-02-25 NOTE — Assessment & Plan Note (Signed)
Had recent suicidal attempt with Zoloft overdose Was admitted at [facility] behavioral health facility Was placed on Zoloft 50 mg daily, but does not prefer to take it Referred to [facility] for [facility] therapy

## 2022-02-26 ENCOUNTER — Encounter (HOSPITAL_COMMUNITY): Payer: Self-pay | Admitting: Psychiatry

## 2022-02-26 ENCOUNTER — Ambulatory Visit (HOSPITAL_BASED_OUTPATIENT_CLINIC_OR_DEPARTMENT_OTHER): Payer: Medicare Other | Admitting: Psychiatry

## 2022-02-26 DIAGNOSIS — F332 Major depressive disorder, recurrent severe without psychotic features: Secondary | ICD-10-CM

## 2022-02-26 MED ORDER — SERTRALINE HCL 25 MG PO TABS: 25.0000 mg | ORAL_TABLET | Freq: Every day | ORAL | 3 refills | Status: DC

## 2022-02-26 NOTE — Patient Instructions (Signed)
Thank you for attending your appointment today.  - Restart Zoloft 25 mg QD - Connect with a therapist - Follow up in a month   Please do not make any changes to medications without first discussing with your provider. If you are experiencing a psychiatric emergency, please call 911 or present to your nearest emergency department. Additional crisis, medication management, and therapy resources are included below.  [facility]  [address] [place], [place] [address] [phone] or [phone] [facility] 24/7 FOR ANYONE [address] [place], [place]  [phone] Fax: [phone] [email or web address] *Interpreters available *Accepts all insurance and uninsured for Urgent Care needs *Accepts Medicaid and uninsured for outpatient treatment (below)

## 2022-02-26 NOTE — Progress Notes (Signed)
Psychiatric Initial Adult Assessment   Patient Identification: Pamela Roman MRN:  [id number] Date of Evaluation:  02/26/2022 Referral Source: Dr. Trena Platt Chief Complaint:   Chief Complaint  Patient presents with   Depression   Visit Diagnosis:    ICD-10-CM   1. Severe episode of recurrent major depressive disorder, without psychotic features (HCC)  F33.2 sertraline (ZOLOFT) 25 MG tablet     Assessment:  Pamela Roman is a 66 y.o. y.o. female with a history of MDD who presents in person to [facility] for initial evaluation following a suicide attempt by intentional overdose on 02/10/22. She currently does not have a therapist, but believes that Dr. Allena Katz is referring her to a therapist connected to his practice.  Patient reports that she has been struggling with depressed mood, fatigue, isolated, and hopeless about her future leading up to the suicide attempt. She denies any prior self harm or thoughts of suicide prior to that along with and AVH, manic symptoms, paranoia, or feelings of being a burden. Of note patient has MS that has been progressing in severity which has in turn left her feeling isolated. Since discharging from the inpatient psychiatric facility patient reports feeling more hopeful, future oriented, less isolated (family in touch and visiting more), and motivated to make adjustments in her life so she can engage in the activities she enjoys. Patient denies any suicidality at this time and was able to engage in making a safety plan with several steps for if depression/suicidality were to progress. She was also open to having only a few weeks of medications at a time and to restarting Zoloft 25 mg.   Plan: - Restart Zoloft 25 mg QD, will keep script to 15 days and no medication scripts should exceed 30 days - TSH, Vit D WNL - Crisis response plan discussed, along with crisis resources - Patient to be connected with a therapist through her  PCP, will refer if that does not improve - Supportive interviewing techniques employed, patient working towards isolating less and connecting with more social supports -  Follow up in 4 weeks  History of Present Illness: Patient presents reporting that 2 weeks ago she took all of her 90 day sertraline script. She had just given up at that time. Patient has MS and feels like this is the worse it has ever been, especially with the loss of her independence/ life that she saw for herself. Contributing to this is her feelings of isolation due to her limited mobility and her sisters not being as involved in her life. These all led to patient making an intentional impulsive suicide attempt by overdosing. Her sister returned home to find Terrea and upon discovering the overdose brought her to the ED. At that time patient was happy to be in the ED and alive as she does not believe it is her time to go yet. She was subsequently hospitalized at [facility] and reports that by time of discharge her mood was improving and she had no further suicidal ideation.  She was motivated to make changes in her life to address her isolation,, hopelessness, and feeling like her future was over. She also has had a number of family members reach out to her and visit which has also made her feel more connected. Patient is working to get a motorized foldable wheel chair so she can be more involved in the community. In addition to looking into joining a MS group or  disability group to socialize at.   Patient reports that she did stop her Zoloft after discharge from the hospital as she felt that she did not need it anymore. This was discussed and the severity of her depression was expressed. Patient was open to restarting on Zoloft 25 mg but declined further increase or alternative medications. Darnisha was open to having shorter duration scripts going forward. She was also able to make a safety plan with steps if she were  to feel more depressed including playing with her dog and calling a family member who has been more supportive lately.   Associated Signs/Symptoms: Depression Symptoms:  depressed mood, fatigue, hopelessness,  Past Psychiatric History: Patient reports that she had no prior psych history until 2 years ago. At that time she was struggling with grief and depression following the passing of her husband and was started on Zoloft 25 mg, which she continued up until she overdosed on 02/10/22. Following her inpatient admission to [facility] patient ws discharged on Zoloft 50 mg QD. She reported stopping the medication after her discharge as she was feeling better. She denies any trials of other psychotropic medications.   Previous Psychotropic Medications: Yes   Substance Abuse History in the last 12 months:  No.  Consequences of Substance Abuse: NA  Past Medical History:  Past Medical History:  Diagnosis Date   Depression    Phreesia 08/06/2020   Hx of migraines    Hyperglycemia    Hypothyroidism    Migraines    Multiple sclerosis (HCC)     Past Surgical History:  Procedure Laterality Date   CATARACT EXTRACTION W/PHACO Left 01/01/2020   Procedure: CATARACT EXTRACTION PHACO AND INTRAOCULAR LENS PLACEMENT LEFT EYE;  Surgeon: Fabio Pierce, MD;  Location: [facility];  Service: Ophthalmology;  Laterality: Left;  CDE: 3.37   CATARACT EXTRACTION W/PHACO Right 01/19/2020   Procedure: CATARACT EXTRACTION PHACO AND INTRAOCULAR LENS PLACEMENT RIGHT EYE;  Surgeon: Fabio Pierce, MD;  Location: [facility];  Service: Ophthalmology;  Laterality: Right;  CDE: 3.16   TONSILLECTOMY      Family Psychiatric History: Denies  Family History:  Family History  Problem Relation Age of Onset   Dementia Mother    Dementia Father     Social History:   Social History   Socioeconomic History   Marital status: Widowed    Spouse name: Not on file   Number of children: Not on file   Years of  education: Not on file   Highest education level: Not on file  Occupational History   Not on file  Tobacco Use   Smoking status: Never   Smokeless tobacco: Never   Tobacco comments:    Lived with smoker  Vaping Use   Vaping Use: Never used  Substance and Sexual Activity   Alcohol use: Not Currently   Drug use: Never   Sexual activity: Not Currently  Other Topics Concern   Not on file  Social History Narrative   ** Merged History Encounter ** Lives with her sister two story home stays on the first floor   Left handed   Caffeine none   Social Determinants of Health   Financial Resource Strain: Low Risk  (11/24/2021)   Overall Financial Resource Strain (CARDIA)    Difficulty of Paying Living Expenses: Not hard at all  Food Insecurity: No Food Insecurity (11/24/2021)   Hunger Vital Sign    Worried About Running Out of Food in the Last Year: Never true  Ran Out of Food in the Last Year: Never true  Transportation Needs: No Transportation Needs (11/24/2021)   PRAPARE - Administrator, Civil Service (Medical): No    Lack of Transportation (Non-Medical): No  Physical Activity: Insufficiently Active (11/24/2021)   Exercise Vital Sign    Days of Exercise per Week: 4 days    Minutes of Exercise per Session: 30 min  Stress: No Stress Concern Present (11/18/2020)   [employer] of Occupational Health - Occupational Stress Questionnaire    Feeling of Stress : Not at all  Social Connections: Moderately Integrated (11/24/2021)   Social Connection and Isolation Panel [NHANES]    Frequency of Communication with Friends and Family: More than three times a week    Frequency of Social Gatherings with Friends and Family: More than three times a week    Attends Religious Services: More than 4 times per year    Active Member of [employer] or Organizations: Yes    Attends Banker Meetings: 1 to 4 times per year    Marital Status: Widowed    Additional Social History:  Darene has been here 2.5 years now, but reports that she has no real connections. Was living in [place] prior, but did not feel like home after her husband passed a few years ago. She moved here with her sister and they live in a house together. Shaniyah and her younger sister moved here to be closer to their older sister. While her younger sister is happy, Dawt feels like she never really found her place. The sister she lives with can be distant at times, while her older sister does not seem to have much time for her. [facility] and has PT come by a couple times a week.   Allergies:   Allergies  Allergen Reactions   Codeine Nausea And Vomiting    Metabolic Disorder Labs: No results found for: "HGBA1C", "MPG" No results found for: "PROLACTIN" Lab Results  Component Value Date   CHOL 200 (H) 11/06/2020   TRIG 129 11/06/2020   HDL 54 11/06/2020   CHOLHDL 3.7 11/06/2020   LDLCALC 123 (H) 11/06/2020   Lab Results  Component Value Date   TSH 2.230 07/16/2021    Therapeutic Level Labs: No results found for: "LITHIUM" No results found for: "CBMZ" No results found for: "VALPROATE"  Current Medications: Current Outpatient Medications  Medication Sig Dispense Refill   cyanocobalamin (VITAMIN B12) 100 MCG tablet Take 100 mcg by mouth daily.     sertraline (ZOLOFT) 25 MG tablet Take 1 tablet (25 mg total) by mouth daily. 15 tablet 3   alendronate (FOSAMAX) 70 MG tablet TAKE 1 TABLET ONCE A WEEK WITH A FULL GLASS OF WATER ON AN EMPTY STOMACH (Patient taking differently: Take 70 mg by mouth once a week.) 12 tablet 3   diazepam (VALIUM) 5 MG tablet TAKE 1 TABLET BY MOUTH 30 TO 40 MINUTES BEFORE MRI 2 tablet 0   levothyroxine (SYNTHROID) 50 MCG tablet TAKE 1 TABLET DAILY BEFORE BREAKFAST 90 tablet 3   simvastatin (ZOCOR) 20 MG tablet TAKE 1 TABLET DAILY 90 tablet 3   Siponimod Fumarate (MAYZENT) 1 MG TABS TAKE 1 TABLET (1 MG) DAILY 90 tablet 0   SUMAtriptan (IMITREX)  100 MG tablet TAKE 1 TABLET EVERY 2 HOURS AS NEEDED FOR MIGRAINE. MAY REPEAT IN 2 HOURS IF HEADACHE PERSISTS OR RECURS (Patient taking differently: Take 100 mg by mouth every 2 (two) hours as  needed for migraine.) 27 tablet 0   Vitamin D, Ergocalciferol, (DRISDOL) 1.25 MG (50000 UNIT) CAPS capsule TAKE 1 CAPSULE EVERY 7 DAYS 5 capsule 9   zonisamide (ZONEGRAN) 100 MG capsule TAKE 1 CAPSULE DAILY 30 capsule 0   No current facility-administered medications for this visit.    Psychiatric Specialty Exam: Review of Systems limited by virtual exam  There were no vitals taken for this visit.There is no height or weight on file to calculate BMI.  General Appearance: Well Groomed  Eye Contact:  Good  Speech:  Clear and Coherent and Normal Rate  Volume:  Normal  Mood:  Depressed and Euthymic  Affect:  Depressed, Labile, and Tearful  Thought Process:  Goal Directed and Linear  Orientation:  Full (Time, Place, and Person)  Thought Content:  Logical  Suicidal Thoughts:  No  Homicidal Thoughts:  No  Memory:  Immediate;   Good  Judgement:  Fair  Insight:  Fair  Psychomotor Activity:  NA  Concentration:  Concentration: Good  Recall:  Good  Fund of Knowledge:Good  Language: Good  Akathisia:  NA  Handed:    AIMS (if indicated):  not done  Assets:  Desire for Improvement Housing Resilience  ADL's:  Impaired  Cognition: WNL  Sleep:  Good   Screenings: Mini-Mental    Flowsheet Row Office Visit from 03/12/2021 in [facility]  Total Score (max 30 points ) 30      PHQ2-9    Flowsheet Row Office Visit from 02/26/2022 in [facility] Office Visit from 02/25/2022 in [facility] Office Visit from 01/13/2022 in [facility] Office Visit from 07/16/2021 in [facility] Office Visit from 03/12/2021 in [facility]  PHQ-2 Total Score 4 6 1  0 0  PHQ-9 Total Score 8 9 1  0 0      Flowsheet Row Office Visit  from 02/26/2022 in [facility] ED from 02/10/2022 in [facility] ED from 07/01/2021 in [facility] at   C-SSRS RISK CATEGORY High Risk High Risk No Risk        Collaboration of Care: Medication Management AEB Zoloft prescirption  Patient/Guardian was advised Release of Information must be obtained prior to any record release in order to collaborate their care with an outside provider. Patient/Guardian was advised if they have not already done so to contact the registration department to sign all necessary forms in order for 07/03/2021 to release information regarding their care.   Consent: Patient/Guardian gives verbal consent for treatment and assignment of benefits for services provided during this visit. Patient/Guardian expressed understanding and agreed to proceed.   [facility], MD 8/24/20232:25 PM    Virtual Visit via Video Note  I connected withNAME@ on 02/26/22 at  1:00 PM EDT by a video enabled telemedicine application and verified that I am speaking with the correct person using two identifiers.  Location: Patient: Home Provider: Home office   I discussed the limitations of evaluation and management by telemedicine and the availability of in person appointments. The patient expressed understanding and agreed to proceed.   I discussed the assessment and treatment plan with the patient. The patient was provided an opportunity to ask questions and all were answered. The patient agreed with the plan and demonstrated an understanding of the instructions.   The patient was advised to call back or seek an in-person evaluation if the symptoms worsen or if the condition fails to improve  as anticipated.  I provided 53 minutes of non-face-to-face time during this encounter.   Stasia Cavalier, MD

## 2022-02-27 ENCOUNTER — Telehealth: Payer: Self-pay | Admitting: Neurology

## 2022-02-27 DIAGNOSIS — G35 Multiple sclerosis: Secondary | ICD-10-CM

## 2022-02-27 DIAGNOSIS — R200 Anesthesia of skin: Secondary | ICD-10-CM

## 2022-02-27 NOTE — Telephone Encounter (Signed)
Patient states that she needs a RX  for a travel portable wheelchair for the MS society. They are going to get her a Wheelchair she would like to speak to someone about this

## 2022-02-27 NOTE — Telephone Encounter (Signed)
Need a Wheelchair. Per patient please fax an RX over for a Psychologist, occupational.   Please advise.

## 2022-03-02 ENCOUNTER — Telehealth: Payer: Self-pay | Admitting: Neurology

## 2022-03-02 ENCOUNTER — Ambulatory Visit (HOSPITAL_COMMUNITY): Payer: Self-pay | Admitting: Psychiatry

## 2022-03-02 NOTE — Telephone Encounter (Signed)
Mark w/ [facility] called to report a fall yesterday while stepson was present, no injuries and doing okay since then. FYI only.

## 2022-03-03 ENCOUNTER — Other Ambulatory Visit: Payer: Self-pay | Admitting: Internal Medicine

## 2022-03-03 DIAGNOSIS — F332 Major depressive disorder, recurrent severe without psychotic features: Secondary | ICD-10-CM

## 2022-03-10 ENCOUNTER — Telehealth: Payer: Self-pay | Admitting: Neurology

## 2022-03-10 DIAGNOSIS — G35 Multiple sclerosis: Secondary | ICD-10-CM

## 2022-03-10 MED ORDER — MAYZENT 1 MG PO TABS: ORAL_TABLET | ORAL | 1 refills | Status: DC

## 2022-03-10 NOTE — Telephone Encounter (Signed)
Patient LM on machine, needs a refill on her Mayzent. I called her back but no answer.

## 2022-03-10 NOTE — Telephone Encounter (Signed)
Refill sent.

## 2022-03-11 ENCOUNTER — Telehealth: Payer: Self-pay | Admitting: Neurology

## 2022-03-11 DIAGNOSIS — G35 Multiple sclerosis: Secondary | ICD-10-CM

## 2022-03-11 MED ORDER — MAYZENT 1 MG PO TABS: ORAL_TABLET | ORAL | 1 refills | Status: DC

## 2022-03-11 NOTE — Telephone Encounter (Signed)
Patient needs a refill on the mayzent medication she uses the patient asst program to get her medication

## 2022-03-11 NOTE — Telephone Encounter (Signed)
Patient left message with the after hour service on 03-11-22 @ 12:29 pm  Caller states that they need their RX called in faxed [phone]. They did not get the name of the medication

## 2022-03-12 ENCOUNTER — Telehealth: Payer: Self-pay | Admitting: Internal Medicine

## 2022-03-12 NOTE — Telephone Encounter (Signed)
Patient advised this was not a good idea.

## 2022-03-12 NOTE — Telephone Encounter (Signed)
Patient friends has tested positive for Covid 9/6, friend is  supposed to come to patients home Sunday. Patient wants to know if its okay if friend comes or is it a bad idea. Wants a call back

## 2022-03-13 ENCOUNTER — Telehealth: Payer: Self-pay | Admitting: Neurology

## 2022-03-13 NOTE — Telephone Encounter (Signed)
Patient would like to speak to someone about her leg weakness and her being very fatigue. She states that her legs are the weakness that they have ever been  please call

## 2022-03-16 NOTE — Telephone Encounter (Signed)
Patient returned call and was informed that Physical Therapy is all Dr. Everlena Cooper can offer. Patient stated she has a physical therapist that comes to her home so she will continue with that. Patient had no further questions or concerns.

## 2022-03-16 NOTE — Telephone Encounter (Signed)
Called patient and left a message for a call back.  

## 2022-03-17 ENCOUNTER — Telehealth: Payer: Self-pay | Admitting: Neurology

## 2022-03-17 ENCOUNTER — Telehealth: Payer: Self-pay | Admitting: Internal Medicine

## 2022-03-17 ENCOUNTER — Other Ambulatory Visit: Payer: Self-pay | Admitting: Internal Medicine

## 2022-03-17 DIAGNOSIS — E039 Hypothyroidism, unspecified: Secondary | ICD-10-CM

## 2022-03-17 DIAGNOSIS — R296 Repeated falls: Secondary | ICD-10-CM

## 2022-03-17 DIAGNOSIS — G35 Multiple sclerosis: Secondary | ICD-10-CM

## 2022-03-17 DIAGNOSIS — F331 Major depressive disorder, recurrent, moderate: Secondary | ICD-10-CM

## 2022-03-17 MED ORDER — MAYZENT 1 MG PO TABS: ORAL_TABLET | ORAL | 1 refills | Status: DC

## 2022-03-17 NOTE — Telephone Encounter (Signed)
Pt called stating that she was supposed to have a referral for counseling and she has not been contacted. Can you please refer?

## 2022-03-17 NOTE — Telephone Encounter (Signed)
Advised patient script sent three times.  Please give me the number and I will call them myself to speak to a pharmacist.  [phone].     Telephone call to Patient assistance for Mayzent, Correct pharmacy is [facility] for the [facility].  Updated Pharmacy, Script sent to [facility].

## 2022-03-17 NOTE — Telephone Encounter (Signed)
Pt called in again, stated she needs something done. She woke up this AM and her legs were dead. She doesn't know what to do.

## 2022-03-17 NOTE — Telephone Encounter (Signed)
Patient called and said she is completely out of her Mayzent, [employer] Patient Assistance  Patient's legs are hurting her today. She waiting for her wheelchair from the MS Society.

## 2022-03-17 NOTE — Telephone Encounter (Signed)
Please let her know this has been resent

## 2022-03-17 NOTE — Telephone Encounter (Signed)
Patient advised of Dr.Jaffe note, She can come in to be evaluated.  If that isn't an option, we can place an order for Solu-Medrol 1000mg  IV daily for 5 days for MS flare.  I would like to get MRIs of the brain, cervical and thoracic spine with and without contrast ASAP as well.  Patient unable to come to the office for evaluation nor go to an infusion center for the infusion. Patient has in home PT. Patitent wants to know if she could the infusion at home.  Will look into the home health Agency she has Nurse aide and Pt with.  Spoke to rep , at [facility] unsure if they office that services will give me a call back.

## 2022-03-17 NOTE — Telephone Encounter (Signed)
[facility] called back, Please send order over and send order to [facility] as well to have the medication sent over to the patient's home.  Order sent to [facility] as requested. Rep Kathlene November will call to make sure the order is received.

## 2022-03-18 ENCOUNTER — Telehealth: Payer: Self-pay | Admitting: Neurology

## 2022-03-18 NOTE — Telephone Encounter (Signed)
Patient called to confirm a call from an infusion place was initiated by Dr. Everlena Cooper for home infusion of steroids for 5 days. Please call back to confirm.

## 2022-03-18 NOTE — Telephone Encounter (Signed)
Pt found out the cost of the infusion at home is extremely high. The cost would be covered if she has it done at a facility. She would like for an order to be sent to [facility].

## 2022-03-18 NOTE — Telephone Encounter (Signed)
Patient advised order sent to [facility] and [facility]

## 2022-03-19 ENCOUNTER — Telehealth: Payer: Self-pay | Admitting: Neurology

## 2022-03-19 MED ORDER — PREDNISONE 50 MG PO TABS: ORAL_TABLET | ORAL | 0 refills | Status: DC

## 2022-03-19 NOTE — Telephone Encounter (Signed)
Pamela Roman offers infusions Tuesdays and Thursdays. Sheena checking with Dr. Everlena Cooper is infusions can be split for patient since she is needing 5 days.

## 2022-03-19 NOTE — Telephone Encounter (Signed)
Josh from [facility] called and LM with AN. He is concerned about the dosage of her prednisone, it seems a little high. He needs to clarify this order [phone]

## 2022-03-19 NOTE — Telephone Encounter (Signed)
Per Patient she do not have transportation to get to the infusion here in [place].

## 2022-03-19 NOTE — Telephone Encounter (Signed)
Pharmacy called and dose verified

## 2022-03-20 DIAGNOSIS — N3946 Mixed incontinence: Secondary | ICD-10-CM | POA: Diagnosis not present

## 2022-03-20 DIAGNOSIS — E559 Vitamin D deficiency, unspecified: Secondary | ICD-10-CM

## 2022-03-20 DIAGNOSIS — F32A Depression, unspecified: Secondary | ICD-10-CM

## 2022-03-20 DIAGNOSIS — G35 Multiple sclerosis: Secondary | ICD-10-CM | POA: Diagnosis not present

## 2022-03-20 DIAGNOSIS — E039 Hypothyroidism, unspecified: Secondary | ICD-10-CM

## 2022-03-20 DIAGNOSIS — G43909 Migraine, unspecified, not intractable, without status migrainosus: Secondary | ICD-10-CM | POA: Diagnosis not present

## 2022-03-20 DIAGNOSIS — M792 Neuralgia and neuritis, unspecified: Secondary | ICD-10-CM | POA: Diagnosis not present

## 2022-03-23 ENCOUNTER — Telehealth: Payer: Self-pay | Admitting: Neurology

## 2022-03-23 NOTE — Telephone Encounter (Signed)
Pt LM stating she was doing much better. She wanted to thank our office for the help. We dont have to call her back, she just wanted to let the office know

## 2022-03-30 ENCOUNTER — Telehealth: Payer: Self-pay

## 2022-03-30 NOTE — Chronic Care Management (AMB) (Signed)
  Care Coordination   Note   03/30/2022 Name: Pamela Roman MRN: [id number] DOB: 1956/03/28  Pamela Roman is a 66 y.o. year old female who sees Lindell Spar, MD for primary care. I reached out to [name] by phone today to offer care coordination services.  Pamela Roman was given information about Care Coordination services today including:   The Care Coordination services include support from the care team which includes your Nurse Coordinator, Clinical Social Worker, or Pharmacist.  The Care Coordination team is here to help remove barriers to the health concerns and goals most important to you. Care Coordination services are voluntary, and the patient may decline or stop services at any time by request to their care team member.   Care Coordination Consent Status: Patient agreed to services and verbal consent obtained.   Follow up plan:  Telephone appointment with care coordination team member scheduled for:  04/01/2022  Encounter Outcome:  Pt. Scheduled  Noreene Larsson, [place],  [address] Direct Dial: [phone] [email or web address]

## 2022-03-31 ENCOUNTER — Encounter (HOSPITAL_COMMUNITY): Payer: Self-pay | Admitting: Psychiatry

## 2022-03-31 ENCOUNTER — Telehealth (HOSPITAL_BASED_OUTPATIENT_CLINIC_OR_DEPARTMENT_OTHER): Payer: Medicare Other | Admitting: Psychiatry

## 2022-03-31 DIAGNOSIS — F332 Major depressive disorder, recurrent severe without psychotic features: Secondary | ICD-10-CM | POA: Diagnosis not present

## 2022-03-31 DIAGNOSIS — G35 Multiple sclerosis: Secondary | ICD-10-CM

## 2022-03-31 MED ORDER — SERTRALINE HCL 25 MG PO TABS
25.0000 mg | ORAL_TABLET | Freq: Every day | ORAL | 3 refills | Status: DC
Start: 2022-03-31 — End: 2022-04-30

## 2022-03-31 NOTE — Progress Notes (Signed)
[facility] MD/PA/NP OP Progress Note  03/31/2022 11:46 AM Pamela Roman  MRN:  [id number]  Visit Diagnosis:    ICD-10-CM   1. Severe episode of recurrent major depressive disorder, without psychotic features ([place])  F33.2     2. Multiple sclerosis ([place])  G35       Assessment: Pamela Roman is a 66 y.o. y.o. female with a history of MDD who presented to [facility] for initial evaluation on 02/26/22 following a suicide attempt by intentional overdose on 02/10/22.   At initial evaluation patient reported that she had been struggling with depressed mood, fatigue, isolated, and hopelessness about her future leading up to the suicide attempt. She denied any prior self harm or thoughts of suicide prior to that along with and AVH, manic symptoms, paranoia, or feelings of being a burden. Of note patient has MS that has been progressing in severity which had in turn left her feeling isolated. Since discharging from the inpatient psychiatric facility patient reported feeling more hopeful, future oriented, less isolated (family in touch and visiting more), and motivated to make adjustments in her life so she can engage in the activities she enjoys. Patient denied any suicidality at time of initial evaluation and was able to engage in making a safety plan with several steps for if depression/suicidality were to progress.   Pamela Roman presents for follow-up evaluation. Today, 03/31/22, patient reports that she has been doing well over the past month and has found the Zoloft to be beneficial. She denies any adverse side effect from the medication. Patient has been able to increase her mobility and socialization, in addition to starting to look toward assisted living options for the future. Patient is scheduled to connect with a therapist tomorrow.    Plan: - Continue Zoloft 25 mg QD, will keep script to 15 days and no medication scripts should exceed 30 days - TSH, Vit D WNL - Crisis response plan  discussed, along with crisis resources - Patient has a therapy appointment scheduled for 04/01/22 - Supportive interviewing techniques employed, patient working towards isolating less and connecting with more social supports -  Follow up in 4 weeks   Chief Complaint:  Chief Complaint  Patient presents with   Follow-up   HPI: Patient reports that she is doing well mentally and there is not a whole lot going on. Her MS symptoms/mobility seem to be getting worse and she had a flare up over the past month. She has been a bit emotional, as she has been deciding that she can not take care of herself and has to start looking at assisted living for next year. While this has been a difficult thing to think about she has been more accepting of the fact she will eventually need assisting living. Pamela Roman is also planning to go to the senior regularly for increased socialization and support.  The MS society came through and she now has a portable foldable wheel chair. She is now working on how to get it in the care and has someone coming on Friday to help get that set up.  She is searching for an MS help group, as her current support group does not seem to be experiencing the same things she does.    Past Psychiatric History: Patient reports that she had no prior psych history until 2 years ago. At that time she was struggling with grief and depression following the passing of her husband and was started on Zoloft 25  mg, which she continued up until she overdosed on 02/10/22. Following her inpatient admission to [facility] patient ws discharged on Zoloft 50 mg QD. She reported stopping the medication after her discharge as she was feeling better. She denies any trials of other psychotropic medications.   Past Medical History:  Past Medical History:  Diagnosis Date   Depression    Phreesia 08/06/2020   Hx of migraines    Hyperglycemia    Hypothyroidism    Migraines    Multiple sclerosis  ([place])     Past Surgical History:  Procedure Laterality Date   CATARACT EXTRACTION W/PHACO Left 01/01/2020   Procedure: CATARACT EXTRACTION PHACO AND INTRAOCULAR LENS PLACEMENT LEFT EYE;  Surgeon: Baruch Goldmann, MD;  Location: [facility];  Service: Ophthalmology;  Laterality: Left;  CDE: 3.37   CATARACT EXTRACTION W/PHACO Right 01/19/2020   Procedure: CATARACT EXTRACTION PHACO AND INTRAOCULAR LENS PLACEMENT RIGHT EYE;  Surgeon: Baruch Goldmann, MD;  Location: [facility];  Service: Ophthalmology;  Laterality: Right;  CDE: 3.16   TONSILLECTOMY      Family Psychiatric History: Denies  Family History:  Family History  Problem Relation Age of Onset   Dementia Mother    Dementia Father     Social History:  Social History   Socioeconomic History   Marital status: Widowed    Spouse name: Not on file   Number of children: Not on file   Years of education: Not on file   Highest education level: Not on file  Occupational History   Not on file  Tobacco Use   Smoking status: Never   Smokeless tobacco: Never   Tobacco comments:    Lived with smoker  Vaping Use   Vaping Use: Never used  Substance and Sexual Activity   Alcohol use: Not Currently   Drug use: Never   Sexual activity: Not Currently  Other Topics Concern   Not on file  Social History Narrative   ** Merged History Encounter ** Lives with her sister two story home stays on the first floor   Left handed   Caffeine none   Social Determinants of Health   Financial Resource Strain: Low Risk  (11/24/2021)   Overall Financial Resource Strain (CARDIA)    Difficulty of Paying Living Expenses: Not hard at all  Food Insecurity: No Food Insecurity (11/24/2021)   Hunger Vital Sign    Worried About Running Out of Food in the Last Year: Never true    [place] in the Last Year: Never true  Transportation Needs: No Transportation Needs (11/24/2021)   PRAPARE - Hydrologist (Medical): No    Lack of  Transportation (Non-Medical): No  Physical Activity: Insufficiently Active (11/24/2021)   Exercise Vital Sign    Days of Exercise per Week: 4 days    Minutes of Exercise per Session: 30 min  Stress: No Stress Concern Present (11/18/2020)   [place]    Feeling of Stress : Not at all  Social Connections: Moderately Integrated (11/24/2021)   Social Connection and Isolation Panel [NHANES]    Frequency of Communication with Friends and Family: More than three times a week    Frequency of Social Gatherings with Friends and Family: More than three times a week    Attends Religious Services: More than 4 times per year    Active Member of [employer] or Organizations: Yes    Attends Archivist  Meetings: 1 to 4 times per year    Marital Status: Widowed    Allergies:  Allergies  Allergen Reactions   Codeine Nausea And Vomiting    Current Medications: Current Outpatient Medications  Medication Sig Dispense Refill   alendronate (FOSAMAX) 70 MG tablet TAKE 1 TABLET ONCE A WEEK WITH A FULL GLASS OF WATER ON AN EMPTY STOMACH (Patient taking differently: Take 70 mg by mouth once a week.) 12 tablet 3   cyanocobalamin (VITAMIN B12) 100 MCG tablet Take 100 mcg by mouth daily.     diazepam (VALIUM) 5 MG tablet TAKE 1 TABLET BY MOUTH 30 TO 40 MINUTES BEFORE MRI 2 tablet 0   levothyroxine (SYNTHROID) 50 MCG tablet TAKE 1 TABLET DAILY BEFORE BREAKFAST 90 tablet 3   predniSONE (DELTASONE) 50 MG tablet take 10 tablets for days 1 to 3, then 8 tablets on day 4, then 6 tablets on day 5, then 4 tablets on day 6, then 2 tablets on day 7, then 1 tablet on day 8, then 1/2 tablet on day 9 52 tablet 0   sertraline (ZOLOFT) 25 MG tablet Take 1 tablet (25 mg total) by mouth daily. 15 tablet 3   simvastatin (ZOCOR) 20 MG tablet TAKE 1 TABLET DAILY 90 tablet 3   Siponimod Fumarate (MAYZENT) 1 MG TABS TAKE 1 TABLET (1 MG) DAILY 90 tablet 1   SUMAtriptan  (IMITREX) 100 MG tablet TAKE 1 TABLET EVERY 2 HOURS AS NEEDED FOR MIGRAINE. MAY REPEAT IN 2 HOURS IF HEADACHE PERSISTS OR RECURS (Patient taking differently: Take 100 mg by mouth every 2 (two) hours as needed for migraine.) 27 tablet 0   Vitamin D, Ergocalciferol, (DRISDOL) 1.25 MG (50000 UNIT) CAPS capsule TAKE 1 CAPSULE EVERY 7 DAYS 5 capsule 9   zonisamide (ZONEGRAN) 100 MG capsule TAKE 1 CAPSULE DAILY 30 capsule 0   No current facility-administered medications for this visit.     Psychiatric Specialty Exam: Review of Systems  There were no vitals taken for this visit.There is no height or weight on file to calculate BMI.  General Appearance: Well Groomed  Eye Contact:  Good  Speech:  Clear and Coherent  Volume:  Normal  Mood:  Euthymic  Affect:  Congruent  Thought Process:  Coherent and Goal Directed  Orientation:  Full (Time, Place, and Person)  Thought Content: Logical   Suicidal Thoughts:  No  Homicidal Thoughts:  No  Memory:  Immediate;   Good  Judgement:  Good  Insight:  Good  Psychomotor Activity:  NA  Concentration:  Concentration: Good  Recall:  Good  Fund of Knowledge: Good  Language: Good  Akathisia:  No    AIMS (if indicated): not done  Assets:  Communication Skills Desire for Improvement  ADL's:  Intact  Cognition: WNL  Sleep:  Good   Metabolic Disorder Labs: No results found for: "HGBA1C", "MPG" No results found for: "PROLACTIN" Lab Results  Component Value Date   CHOL 200 (H) 11/06/2020   TRIG 129 11/06/2020   HDL 54 11/06/2020   CHOLHDL 3.7 11/06/2020   LDLCALC 123 (H) 11/06/2020   Lab Results  Component Value Date   TSH 2.230 07/16/2021   TSH 1.470 11/06/2020    Therapeutic Level Labs: No results found for: "LITHIUM" No results found for: "VALPROATE" No results found for: "CBMZ"   Screenings: Mini-Mental    Flowsheet Row Office Visit from 03/12/2021 in [facility]  Total Score (max 30 points ) 30  [place] Office Visit from 02/26/2022 in [facility] Office Visit from 02/25/2022 in [facility] Office Visit from 01/13/2022 in [facility] Office Visit from 07/16/2021 in [facility] Office Visit from 03/12/2021 in [facility]  PHQ-2 Total Score 4 6 1  0 0  PHQ-9 Total Score 8 9 1  0 0      Flowsheet Row Office Visit from 02/26/2022 in [facility] from 02/10/2022 in [facility] from 07/01/2021 in [facility] High Risk High Risk No Risk       Collaboration of Care: Collaboration of Care: Medication Management AEB medication management  Patient/Guardian was advised Release of Information must be obtained prior to any record release in order to collaborate their care with an outside provider. Patient/Guardian was advised if they have not already done so to contact the registration department to sign all necessary forms in order for [place] to release information regarding their care.   Consent: Patient/Guardian gives verbal consent for treatment and assignment of benefits for services provided during this visit. Patient/Guardian expressed understanding and agreed to proceed.    Vista Mink, MD 03/31/2022, 11:46 AM   Virtual Visit via Video Note  I connected with Pamela Roman on 03/31/22 at  1:00 PM EDT by a video enabled telemedicine application and verified that I am speaking with the correct person using two identifiers.  Location: Patient: Home Provider: Home Office   I discussed the limitations of evaluation and management by telemedicine and the availability of in person appointments. The patient expressed understanding and agreed to proceed.   I discussed the assessment and treatment plan with the patient. The patient was provided an opportunity to ask questions and all were answered.  The patient agreed with the plan and demonstrated an understanding of the instructions.   The patient was advised to call back or seek an in-person evaluation if the symptoms worsen or if the condition fails to improve as anticipated.  I provided 25 minutes of non-face-to-face time during this encounter.   Vista Mink, MD

## 2022-04-01 ENCOUNTER — Ambulatory Visit: Payer: Self-pay | Admitting: *Deleted

## 2022-04-01 ENCOUNTER — Encounter: Payer: Self-pay | Admitting: *Deleted

## 2022-04-01 NOTE — Patient Instructions (Signed)
Visit Information  Thank you for taking time to visit with me today. Please don't hesitate to contact me if I can be of assistance to you.   Following are the goals we discussed today:   Goals Addressed               This Visit's Progress     Reduce and Manage Symptoms of Depression, Isolation and Loneliness. (pt-stated)   On track     Care Coordination Interventions:  Assessed Social Determinant of Health Barriers. Provided Education to Patient About Advanced Directives Completion. Discussed Plans with Patient for Ongoing Care Management Follow Up. Provided Patient with Direct Contact Information for Care Management Team. Screening for Signs and Symptoms of Depression Related to Chronic Disease State.  DEY8/XKG8 Depression Screen Completed & Results Reviewed with Patient.  Suicidal Ideation/Homicidal Ideation Assessed - None Present, But History of Suicide Attempt by Prescription Overdose. Solution-Focused Strategies Employed. Deep Breathing Exercises, Relaxation Techniques & Mindfulness Meditation Strategies Taught & Encouraged Daily.   Active Listening/Reflection Utilized.  Emotional Support Provided. Verbalization of Feelings Encouraged.  Problem Solving/Task Centered Solutions Developed.   Provided Psychoeducation for Mental Health Concerns. Reviewed Mental Health Medications & Discussed Importance of Compliance. Quality of Sleep Assessed & Sleep Hygiene Techniques Promoted. Participation in Counseling Encouraged. Review List of [employer] and Resources, Emailed on 04/01/2022:  ~ Counseling Agencies in [place]  ~ [place] and Substance Abuse Resources  ~ Marriage, Relationship, Individual and Family Counseling Services  ~ Grief Share Brochure Review List of [employer] and Resources, Emailed on 04/01/2022: ~ [place] and Facilities in [place]  ~ [place] ~ [place]  to [place] During Next Scheduled Telephone [employer].           Our next appointment is by telephone on 04/08/2022 at 12:45 pm.  Please call the care guide team at [phone] if you need to cancel or reschedule your appointment.   If you are experiencing a Mental Health or [place] or need someone to talk to, please call the Suicide and Crisis Lifeline: 988 call the [place] National Suicide Prevention Lifeline: 339-867-9877 or TTY: 6705580993 TTY (478)512-3384) to talk to a trained counselor call 1-800-273-TALK (toll free, 24 hour hotline) go to [facility] [address], [place] [phone]) call the [place]: (817) [phone] call 911  Patient verbalizes understanding of instructions and care plan provided today and agrees to view in [place]. Active MyChart status and patient understanding of how to access instructions and care plan via MyChart confirmed with patient.     Telephone follow up appointment with care management team member scheduled for:  04/08/2022 at 12:45 pm.  Nat Christen, BSW, MSW, [place]  Licensed Clinical Social Worker  [place]  Mailing [place]. [address], [place], [place] [address] Physical Address-[address] [place], [place] [address] Toll Free Main # [phone] Fax # [phone] Cell # [phone] [email or web address]

## 2022-04-01 NOTE — Patient Outreach (Signed)
  Care Coordination   Initial Visit Note   04/01/2022  Name: Pamela Roman MRN: [id number] DOB: [birth year]  Pamela Roman is a 66 y.o. year old female who sees Pamela Spar, MD for primary care. I spoke with Pamela Roman by phone today.  What matters to the patients health and wellness today?  Reduce and Manage Symptoms of Depression, Isolation and Loneliness.    Goals Addressed               This Visit's Progress     Reduce and Manage Symptoms of Depression, Isolation and Loneliness. (pt-stated)   On track     Care Coordination Interventions:  Assessed Social Determinant of Health Barriers. Provided Education to Patient About Advanced Directives Completion. Discussed Plans with Patient for Ongoing Care Management Follow Up. Provided Patient with Direct Contact Information for Care Management Team. Screening for Signs and Symptoms of Depression Related to Chronic Disease State.  KXF8/HWE9 Depression Screen Completed & Results Reviewed with Patient.  Suicidal Ideation/Homicidal Ideation Assessed - None Present, But History of Suicide Attempt by Prescription Overdose. Solution-Focused Strategies Employed. Deep Breathing Exercises, Relaxation Techniques & Mindfulness Meditation Strategies Taught & Encouraged Daily.   Active Listening/Reflection Utilized.  Emotional Support Provided. Verbalization of Feelings Encouraged.  Problem Solving/Task Centered Solutions Developed.   Provided Psychoeducation for Mental Health Concerns. Reviewed Mental Health Medications & Discussed Importance of Compliance. Quality of Sleep Assessed & Sleep Hygiene Techniques Promoted. Participation in Counseling Encouraged. Review List of [employer] and Resources, Emailed on 04/01/2022:  ~ Counseling Agencies in [place]  ~ [place] and Substance Abuse Resources  ~ Marriage, Relationship, Individual and Family Counseling Services  ~ Grief Share Brochure Review List of [employer] and Resources, Emailed on 04/01/2022: ~ [place] and Facilities in [place]  ~ [place] ~ [place] at [place] to [place] During Next Scheduled Telephone [employer].           SDOH assessments and interventions completed:  Yes.  SDOH Interventions Today    Flowsheet Row Most Recent Value  SDOH Interventions   Food Insecurity Interventions Intervention Not Indicated  Housing Interventions Intervention Not Indicated  Transportation Interventions Intervention Not Indicated  Utilities Interventions Intervention Not Indicated  Alcohol Usage Interventions Intervention Not Indicated (Score <7)  Depression Interventions/Treatment  Referral to Psychiatry, Medication, Counseling  Financial Strain Interventions Intervention Not Indicated  Physical Activity Interventions Intervention Not Indicated, [employer]  Stress Interventions Offered [employer] Resources, Provide Counseling  Social Connections Interventions Intervention Not Indicated        Care Coordination Interventions Activated:  Yes.   Care Coordination Interventions:  Yes, provided.   Follow up plan: Follow up call scheduled for 04/08/2022 at 12:45 pm.  Encounter Outcome:  Pt. Visit Completed.   Nat Christen, BSW, MSW, LCSW  Licensed Education officer, environmental Health System  Mailing [place] [address] [place], [place] [address] Physical Address-[address], [place], [place] [address] Toll Free Main # [phone] Fax # [phone] Cell # [phone] [email or web address]

## 2022-04-02 ENCOUNTER — Telehealth: Payer: Self-pay | Admitting: Internal Medicine

## 2022-04-02 DIAGNOSIS — Z9181 History of falling: Secondary | ICD-10-CM

## 2022-04-02 DIAGNOSIS — G35 Multiple sclerosis: Secondary | ICD-10-CM | POA: Diagnosis not present

## 2022-04-02 DIAGNOSIS — M792 Neuralgia and neuritis, unspecified: Secondary | ICD-10-CM | POA: Diagnosis not present

## 2022-04-02 DIAGNOSIS — G43909 Migraine, unspecified, not intractable, without status migrainosus: Secondary | ICD-10-CM | POA: Diagnosis not present

## 2022-04-02 DIAGNOSIS — E559 Vitamin D deficiency, unspecified: Secondary | ICD-10-CM

## 2022-04-02 DIAGNOSIS — E039 Hypothyroidism, unspecified: Secondary | ICD-10-CM

## 2022-04-02 DIAGNOSIS — N3946 Mixed incontinence: Secondary | ICD-10-CM | POA: Diagnosis not present

## 2022-04-02 DIAGNOSIS — F32A Depression, unspecified: Secondary | ICD-10-CM

## 2022-04-02 NOTE — Telephone Encounter (Signed)
[facility] called to report patient fall.    Call back [phone]

## 2022-04-08 ENCOUNTER — Ambulatory Visit: Payer: Self-pay | Admitting: *Deleted

## 2022-04-08 ENCOUNTER — Encounter: Payer: Self-pay | Admitting: *Deleted

## 2022-04-08 NOTE — Patient Instructions (Signed)
Visit Information  Thank you for taking time to visit with me today. Please don't hesitate to contact me if I can be of assistance to you.   Following are the goals we discussed today:   Goals Addressed               This Visit's Progress     Reduce and Manage Symptoms of Depression, Isolation and Loneliness. (pt-stated)   On track     Care Coordination Interventions:  Deep Breathing Exercises, Relaxation Techniques & Mindfulness Meditation Strategies Reviewed & Encouraged Daily.   Active Listening/Reflection Utilized.  Emotional Support Provided. Verbalization of Feelings Encouraged.  Problem Solving/Task Centered Solutions Employed  Reviewed the Following List of Counseling Agencies and Resources:  ~ Surveyor, minerals in [place]  ~ [place] and Substance Abuse Resources  ~ Marriage, Relationship, Individual and Family Counseling Services  ~ Grief Share Brochure Reviewed the Following List of [place] and Resources: ~ Careers adviser and Facilities in [place]  ~ [employer] and Engineer, maintenance (IT) ~ [place] 1 Day Per Week [phone]), 4 Hours Per Day, at a Rate of $24.00 Per Hour, Through [facility]. Continue to Participate in Specialized Exercise Program at the [facility] Every Monday. Continue to Attend Services Offered at the [facility] Every Tuesday and Thursday.            Our next appointment is by telephone on 04/16/2022 at 11:00 am.  Please call the care guide team at [phone] if you need to cancel or reschedule your appointment.   If you are experiencing a Mental Health or [place] or need someone to talk to, please call the Suicide and Crisis Lifeline: 988 call the [place] National Suicide Prevention Lifeline: 631-480-8163 or TTY: 801-707-1754 TTY 909-059-7078) to talk to a trained counselor call 1-800-273-TALK (toll free, 24 hour  hotline) go to [facility] [address] [place] [phone]) call the [place]: ([phone] [phone] call 911  Patient verbalizes understanding of instructions and care plan provided today and agrees to view in [place]. Active MyChart status and patient understanding of how to access instructions and care plan via MyChart confirmed with patient.     Telephone follow up appointment with care management team member scheduled for:  04/16/2022 at 11:00 am.  Nat Christen, BSW, MSW, [place]  Licensed Clinical Social Worker  [place]  Mailing [place]. [address], [place], [place] [address] Physical Address-[address], [place], [place] [address] Toll Free Main # [phone] Fax # [phone] Cell # [phone] [email or web address]

## 2022-04-08 NOTE — Patient Outreach (Signed)
  Care Coordination   Follow Up Visit Note   04/08/2022  Name: BRIANNIE GUTIERREZ MRN: [id number] DOB: [birth year]  Pamela Roman is a 66 y.o. year old female who sees Lindell Spar, MD for primary care. I spoke with [name] by phone today.  What matters to the patients health and wellness today?   Reduce and Manage Symptoms of Depression, Isolation and Loneliness.   Goals Addressed               This Visit's Progress     Reduce and Manage Symptoms of Depression, Isolation and Loneliness. (pt-stated)   On track     Care Coordination Interventions:  Deep Breathing Exercises, Relaxation Techniques & Mindfulness Meditation Strategies Reviewed & Encouraged Daily.   Active Listening/Reflection Utilized.  Emotional Support Provided. Verbalization of Feelings Encouraged.  Problem Solving/Task Centered Solutions Employed  Reviewed the Following List of Counseling Agencies and Resources:  ~ Surveyor, minerals in [place]  ~ [place] and Substance Abuse Resources  ~ Marriage, Relationship, Individual and Family Counseling Services  ~ Grief Share Brochure Reviewed the Following List of [place] and Resources: ~ Careers adviser and Facilities in [place]  ~ [employer] and Engineer, maintenance (IT) ~ [place] 1 Day Per Week [phone]), 4 Hours Per Day, at a Rate of $24.00 Per Hour, Through [facility]. Continue to Participate in Specialized Exercise Program at the [facility] Every Monday. Continue to Attend Services Offered at the [facility] Every Tuesday and Thursday.          SDOH assessments and interventions completed:  Yes.  Care Coordination Interventions Activated:  Yes.   Care Coordination Interventions:  Yes, provided.   Follow up plan: Follow up call scheduled for 04/16/2022 at 11:00 am.  Encounter Outcome:  Pt. Visit Completed.   Nat Christen, BSW, MSW, LCSW  Licensed  Education officer, environmental Health System  Mailing [place] [address], [place], [place] [address] Physical Address-[address] [place], [place] [address] Toll Free Main # [phone] Fax # [phone] Cell # [phone] [email or web address]

## 2022-04-14 ENCOUNTER — Telehealth: Payer: Self-pay | Admitting: Neurology

## 2022-04-14 NOTE — Telephone Encounter (Signed)
Patient called and said she wanting to speak with someone about her zonisamide. She said she is almost out of the medicine but is not sure Dr. Tomi Likens wants her to continue it. She'd like a call back.

## 2022-04-16 ENCOUNTER — Other Ambulatory Visit: Payer: Self-pay

## 2022-04-16 ENCOUNTER — Encounter: Payer: Self-pay | Admitting: *Deleted

## 2022-04-16 ENCOUNTER — Ambulatory Visit: Payer: Self-pay | Admitting: *Deleted

## 2022-04-16 MED ORDER — ZONISAMIDE 100 MG PO CAPS: 100.0000 mg | ORAL_CAPSULE | Freq: Every day | ORAL | 5 refills | Status: DC

## 2022-04-16 NOTE — Patient Outreach (Signed)
  Care Coordination   Follow Up Visit Note   04/16/2022  Name: Pamela Roman MRN: [id number] DOB: 1955/12/06  Pamela Roman is a 65 y.o. year old female who sees Pamela Spar, MD for primary care. I spoke with Pamela Roman by phone today.  What matters to the patients health and wellness today?  Reduce and Manage Symptoms of Depression, Isolation and Loneliness.    Goals Addressed               This Visit's Progress     Reduce and Manage Symptoms of Depression, Isolation and Loneliness. (pt-stated)   On track     Care Coordination Interventions:  Deep Breathing Exercises, Relaxation Techniques & Mindfulness Meditation Strategies Emphasized Daily.    Emotional Support Provided. Verbalization of Feelings Employed.  Patient Agreed to Pamela Roman, in An Effort to [employer]. Encouraged Patient to [name] and Resources of Interest, to [name], Availability, Minimum Time Requirements, Etc. Continue to [name], 1 Day Per Week [phone]), 4 Hours Per Day, Through [facility]. Continued Participation in Specialized Exercise Program at the [facility], Every Monday, for 1 Hour. Continued Participation in Services Offered at the [facility], Every Tuesday and Thursday, for 3 Hours.          SDOH assessments and interventions completed:  Yes.   Care Coordination Interventions Activated:  Yes.    Care Coordination Interventions:  Yes, provided.    Follow up plan: Follow up call scheduled for 04/30/2022 at 11:30 am.   Encounter Outcome:  Pt. Visit Completed.    Pamela Roman, BSW, MSW, LCSW  Licensed Education officer, environmental Health System  Mailing Pamela Roman [address]. [address] [place], [place] [address] Physical Address-[address] [place], [place] [address] Toll Free Main # [phone] Fax #  [phone] Cell # [phone] [email or web address]

## 2022-04-16 NOTE — Patient Instructions (Signed)
Visit Information  Thank you for taking time to visit with me today. Please don't hesitate to contact me if I can be of assistance to you.   Following are the goals we discussed today:   Goals Addressed               This Visit's Progress     Reduce and Manage Symptoms of Depression, Isolation and Loneliness. (pt-stated)   On track     Care Coordination Interventions:  Deep Breathing Exercises, Relaxation Techniques & Mindfulness Meditation Strategies Emphasized Daily.    Emotional Support Provided. Verbalization of Feelings Employed.  Patient Agreed to [place], in An Effort to [employer]. Encouraged Patient to [place] and Resources of Interest, to [name], Availability, Minimum Time Requirements, Etc. Continue to [name], 1 Day Per Week [phone]), 4 Hours Per Day, Through [facility]. Continued Participation in Specialized Exercise Program at the [facility], Every Monday, for 1 Hour. Continued Participation in Services Offered at the [facility], Every Tuesday and Thursday, for 3 Hours.            Our next appointment is by telephone on 04/30/2022 at 11:30 am.   Please call the care guide team at [phone] if you need to cancel or reschedule your appointment.   If you are experiencing a Mental Health or [place] or need someone to talk to, please call the Suicide and Crisis Lifeline: 988 call the [place] National Suicide Prevention Lifeline: 6394920501 or TTY: (816) 822-0063 TTY 915 507 9338) to talk to a trained counselor call 1-800-273-TALK (toll free, 24 hour hotline) go to [facility] [address], [place] [phone]) call the [place] [facility]: ([phone] call 911  Patient verbalizes understanding of instructions and care plan provided today and agrees to  view in [place]. Active MyChart status and patient understanding of how to access instructions and care plan via MyChart confirmed with patient.     Telephone follow up appointment with care management team member scheduled for:  04/30/2022 at 11:30 am.  Nat Christen, BSW, MSW, [place]  Licensed Clinical Social Worker  [place]  Mailing [place]. [address], [place], [place] [address] Physical Address-[address] [place], [place] [address] Toll Free Main # [phone] Fax # [phone] Cell # [phone] [email or web address]

## 2022-04-16 NOTE — Telephone Encounter (Signed)
Called Pt and informed her of answer Per Dr. Tomi Likens. [facility] to [facility].

## 2022-04-21 ENCOUNTER — Encounter: Payer: Self-pay | Admitting: Neurology

## 2022-04-21 ENCOUNTER — Telehealth: Payer: Self-pay | Admitting: Neurology

## 2022-04-21 NOTE — Telephone Encounter (Signed)
Patient wants to know if Dr.Jaffe could type a letter to have transportation to the [facility] for the Personal trainer she has for Upper body strengthen.   The letter has to say she needs the transportation because the Trainer is need for MS and Strengthen class.   Patient has her motorize scooter.

## 2022-04-21 NOTE — Telephone Encounter (Signed)
Patient advised letter we will go in the mail.

## 2022-04-21 NOTE — Telephone Encounter (Signed)
Patient states that she goes to the [facility] and needs to see if someone can call her back to see if Dr Tomi Likens will do a letter for her

## 2022-04-30 ENCOUNTER — Telehealth (HOSPITAL_BASED_OUTPATIENT_CLINIC_OR_DEPARTMENT_OTHER): Payer: Medicare Other | Admitting: Psychiatry

## 2022-04-30 ENCOUNTER — Encounter: Payer: Self-pay | Admitting: *Deleted

## 2022-04-30 ENCOUNTER — Encounter (HOSPITAL_COMMUNITY): Payer: Self-pay | Admitting: Psychiatry

## 2022-04-30 ENCOUNTER — Ambulatory Visit: Payer: Self-pay | Admitting: *Deleted

## 2022-04-30 DIAGNOSIS — G35 Multiple sclerosis: Secondary | ICD-10-CM

## 2022-04-30 DIAGNOSIS — F332 Major depressive disorder, recurrent severe without psychotic features: Secondary | ICD-10-CM

## 2022-04-30 MED ORDER — SERTRALINE HCL 25 MG PO TABS: 25.0000 mg | ORAL_TABLET | Freq: Every day | ORAL | 3 refills | Status: DC

## 2022-04-30 NOTE — Patient Instructions (Signed)
Visit Information  Thank you for taking time to visit with me today. Please don't hesitate to contact me if I can be of assistance to you.   Following are the goals we discussed today:   Goals Addressed               This Visit's Progress     COMPLETED: Reduce and Manage Symptoms of Depression, Isolation and Loneliness. (pt-stated)   On track     Care Coordination Interventions:  Verbalization of Feelings Encouraged.  Emotional Support Provided. Cognitive Behavioral Therapy Initiated. Continue Building control surveyor, 1 Day Per Week, Every Wednesday, for 4 Hours Per Day, Through [facility]. Continue Participation in Specialized Exercise Program at the [facility], Every Monday, Wednesday and Friday, for 1 Hour. Continue Participation in [place] at the [facility], Every Tuesday and Thursday, for 3 Hours.  Congratulations on Approval for Motorized Wheelchair to [employer]. Continue to Receive Psychotropic Medication Management and Psychotherapeutic Counseling Services Monthly, Through Charlette Caffey, Psychiatrist with [place] at [place].  ~ Follow-Up Appointment Scheduled on 04/30/2022 at 2:00 pm.         Please call the care guide team at [phone] if you need to cancel or reschedule your appointment.   If you are experiencing a Mental Health or [place] or need someone to talk to, please call the Suicide and Crisis Lifeline: 988 call the [place] National Suicide Prevention Lifeline: 959 275 3665 or TTY: 564-414-4509 TTY 564-345-8294) to talk to a trained counselor call 1-800-273-TALK (toll free, 24 hour hotline) go to [facility] [address] [place] [phone]) call the [place]: ([phone] call 911  Patient verbalizes understanding of instructions and care plan provided today and agrees to view in [place]. Active MyChart  status and patient understanding of how to access instructions and care plan via MyChart confirmed with patient.     No further follow up required.  Pamela Roman, BSW, MSW, LCSW  Licensed Education officer, environmental Health System  Mailing [employer] [address] [place], [place] [address] Physical Address-[address] [place],  [address] Toll Free Main # [phone] Fax # [phone] Cell # [phone] [email or web address]

## 2022-04-30 NOTE — Progress Notes (Addendum)
BH MD/PA/NP OP Progress Note  04/30/2022 2:17 PM Pamela Roman  MRN:  [id number]  Visit Diagnosis:    ICD-10-CM   1. Severe episode of recurrent major depressive disorder, without psychotic features (HCC)  F33.2 sertraline (ZOLOFT) 25 MG tablet    2. Multiple sclerosis (HCC)  G35       Assessment: Pamela Roman is a 66 y.o. y.o. female with a history of MDD who presented to [facility] for initial evaluation on 02/26/22 following a suicide attempt by intentional overdose on 02/10/22.   At initial evaluation patient reported that she had been struggling with depressed mood, fatigue, isolated, and hopelessness about her future leading up to the suicide attempt. She denied any prior self harm or thoughts of suicide prior to that along with and AVH, manic symptoms, paranoia, or feelings of being a burden. Of note patient has MS that has been progressing in severity which had in turn left her feeling isolated. Since discharging from the inpatient psychiatric facility patient reported feeling more hopeful, future oriented, less isolated (family in touch and visiting more), and motivated to make adjustments in her life so she can engage in the activities she enjoys. Patient denied any suicidality at time of initial evaluation and was able to engage in making a safety plan with several steps for if depression/suicidality were to progress.   Pamela Roman presents for follow-up evaluation. Today, 04/30/22, patient reports that the last month has gone really well. Her mood has been good and she has felt a lot more support from her family and friends around her. Patient has also had a caregiver that comes once a week to help around the house in addition to connecting with a therapist once a week. She reports a good response to the medication and denies any adverse side effects.   Plan: - Continue Zoloft 25 mg QD, will keep script to 15 days and no medication scripts should exceed 30 days -  TSH, Vit D WNL - Crisis response plan discussed, along with crisis resources - Therapy/care coordination with [facility] once a week - Supportive interviewing techniques employed, patient working towards isolating less and connecting with more social supports -  Follow up in 7 weeks   Chief Complaint:  Chief Complaint  Patient presents with   Follow-up   HPI: Things have been going very well for her. Pamela Roman reports that her niece and her husband came to help around the house. They helped to set up her wheelchair and her house so she can take it around without getting stuck in the door ways. She has had lots of calls and visits from her kids and is feeling much more connected. They all came together and helped her get a new TV one when her current one died. Pamela Roman also has a care giver who goes in once a week and talks to a Social worker once a week. Pamela Roman still goes to the MS group and has a support she can talk to there regularly who lives in [place].   Past Psychiatric History: Patient reports that she had no prior psych history until 2021. At that time she was struggling with grief and depression following the passing of her husband and was started on Zoloft 25 mg, which she continued up until she overdosed on 02/10/22. [facility] patient ws discharged on Zoloft 50 mg QD. She reported stopping the medication after her discharge as she was  feeling better. She denies any trials of other psychotropic medications.   Past Medical History:  Past Medical History:  Diagnosis Date   Depression    Phreesia 08/06/2020   Hx of migraines    Hyperglycemia    Hypothyroidism    Migraines    Multiple sclerosis ([place])     Past Surgical History:  Procedure Laterality Date   CATARACT EXTRACTION W/PHACO Left 01/01/2020   Procedure: CATARACT EXTRACTION PHACO AND INTRAOCULAR LENS PLACEMENT LEFT EYE;  Surgeon: Baruch Goldmann, MD;   Location: [facility];  Service: Ophthalmology;  Laterality: Left;  CDE: 3.37   CATARACT EXTRACTION W/PHACO Right 01/19/2020   Procedure: CATARACT EXTRACTION PHACO AND INTRAOCULAR LENS PLACEMENT RIGHT EYE;  Surgeon: Baruch Goldmann, MD;  Location: [facility];  Service: Ophthalmology;  Laterality: Right;  CDE: 3.16   TONSILLECTOMY      Family Psychiatric History: Denies  Family History:  Family History  Problem Relation Age of Onset   Dementia Mother    Dementia Father     Social History:  Social History   Socioeconomic History   Marital status: Widowed    Spouse name: Not on file   Number of children: 0   Years of education: 16   Highest education level: 12th grade  Occupational History   Not on file  Tobacco Use   Smoking status: Never    Passive exposure: Never   Smokeless tobacco: Never   Tobacco comments:    Lived with smoker  Vaping Use   Vaping Use: Never used  Substance and Sexual Activity   Alcohol use: Not Currently   Drug use: Never   Sexual activity: Not Currently    Partners: Male  Other Topics Concern   Not on file  Social History Narrative   ** Merged History Encounter ** Lives with her sister two story home stays on the first floor   Left handed   Caffeine none   Social Determinants of Health   Financial Resource Strain: Low Risk  (04/01/2022)   Overall Financial Resource Strain (CARDIA)    Difficulty of Paying Living Expenses: Not hard at all  Food Insecurity: No Food Insecurity (04/01/2022)   Hunger Vital Sign    Worried About Running Out of Food in the Last Year: Never true    Ran Out of Food in the Last Year: Never true  Transportation Needs: No Transportation Needs (04/01/2022)   PRAPARE - Hydrologist (Medical): No    Lack of Transportation (Non-Medical): No  Physical Activity: Sufficiently Active (04/01/2022)   Exercise Vital Sign    Days of Exercise per Week: 5 days    Minutes of Exercise per Session: 30 min  Stress:  Stress Concern Present (04/01/2022)   [place]    Feeling of Stress : Rather much  Social Connections: Moderately Integrated (04/01/2022)   Social Connection and Isolation Panel [NHANES]    Frequency of Communication with Friends and Family: More than three times a week    Frequency of Social Gatherings with Friends and Family: More than three times a week    Attends Religious Services: More than 4 times per year    Active Member of [employer] or Organizations: Yes    Attends Archivist Meetings: More than 4 times per year    Marital Status: Widowed    Allergies:  Allergies  Allergen Reactions   Codeine Nausea And Vomiting    Current Medications: Current  Outpatient Medications  Medication Sig Dispense Refill   alendronate (FOSAMAX) 70 MG tablet TAKE 1 TABLET ONCE A WEEK WITH A FULL GLASS OF WATER ON AN EMPTY STOMACH (Patient taking differently: Take 70 mg by mouth once a week.) 12 tablet 3   cyanocobalamin (VITAMIN B12) 100 MCG tablet Take 100 mcg by mouth daily.     levothyroxine (SYNTHROID) 50 MCG tablet TAKE 1 TABLET DAILY BEFORE BREAKFAST 90 tablet 3   predniSONE (DELTASONE) 50 MG tablet take 10 tablets for days 1 to 3, then 8 tablets on day 4, then 6 tablets on day 5, then 4 tablets on day 6, then 2 tablets on day 7, then 1 tablet on day 8, then 1/2 tablet on day 9 52 tablet 0   sertraline (ZOLOFT) 25 MG tablet Take 1 tablet (25 mg total) by mouth daily. 15 tablet 3   simvastatin (ZOCOR) 20 MG tablet TAKE 1 TABLET DAILY 90 tablet 3   Siponimod Fumarate (MAYZENT) 1 MG TABS TAKE 1 TABLET (1 MG) DAILY 90 tablet 1   SUMAtriptan (IMITREX) 100 MG tablet TAKE 1 TABLET EVERY 2 HOURS AS NEEDED FOR MIGRAINE. MAY REPEAT IN 2 HOURS IF HEADACHE PERSISTS OR RECURS (Patient taking differently: Take 100 mg by mouth every 2 (two) hours as needed for migraine.) 27 tablet 0   Vitamin D, Ergocalciferol, (DRISDOL) 1.25 MG (50000  UNIT) CAPS capsule TAKE 1 CAPSULE EVERY 7 DAYS 5 capsule 9   zonisamide (ZONEGRAN) 100 MG capsule Take 1 capsule (100 mg total) by mouth daily. 30 capsule 5   No current facility-administered medications for this visit.     Psychiatric Specialty Exam: Review of Systems  There were no vitals taken for this visit.There is no height or weight on file to calculate BMI.  General Appearance: Well Groomed  Eye Contact:  Good  Speech:  Clear and Coherent  Volume:  Normal  Mood:  Euthymic  Affect:  Congruent  Thought Process:  Coherent and Goal Directed  Orientation:  Full (Time, Place, and Person)  Thought Content: Logical   Suicidal Thoughts:  No  Homicidal Thoughts:  No  Memory:  Immediate;   Good  Judgement:  Good  Insight:  Good  Psychomotor Activity:  NA  Concentration:  Concentration: Good  Recall:  Good  Fund of Knowledge: Good  Language: Good  Akathisia:  No    AIMS (if indicated): not done  Assets:  Communication Skills Desire for Improvement  ADL's:  Intact  Cognition: WNL  Sleep:  Good   Metabolic Disorder Labs: No results found for: "HGBA1C", "MPG" No results found for: "PROLACTIN" Lab Results  Component Value Date   CHOL 200 (H) 11/06/2020   TRIG 129 11/06/2020   HDL 54 11/06/2020   CHOLHDL 3.7 11/06/2020   LDLCALC 123 (H) 11/06/2020   Lab Results  Component Value Date   TSH 2.230 07/16/2021   TSH 1.470 11/06/2020    Therapeutic Level Labs: No results found for: "LITHIUM" No results found for: "VALPROATE" No results found for: "CBMZ"   Screenings: Mini-Mental    Flowsheet Row Office Visit from 03/12/2021 in [facility]  Total Score (max 30 points ) 30      PHQ2-9    Flowsheet Row Care Coordination from 04/01/2022 in [facility] Office Visit from [facility]20[facility] Office Visit from 02/25/2022 in [facility] Office Visit from 01/13/2022  in [facility] Office Visit from 07/16/2021 in [facility]  PHQ-2 Total Score 4 4 6 1  0  PHQ-9 Total Score 10 8 9 1  0      Flowsheet Row [facility] from 02/10/2022 in [facility] EMERGENCY [facility] ED from 07/01/2021 in [facility] at [facility] RISK CATEGORY High Risk High Risk No Risk       Collaboration of Care: Collaboration of Care: Medication Management AEB medication management  Patient/Guardian was advised Release of Information must be obtained prior to any record release in order to collaborate their care with an outside provider. Patient/Guardian was advised if they have not already done so to contact the registration department to sign all necessary forms in order for [facility] to release information regarding their care.   Consent: Patient/Guardian gives verbal consent for treatment and assignment of benefits for services provided during this visit. Patient/Guardian expressed understanding and agreed to proceed.    [facility], MD 04/30/2022, 2:17 PM   Virtual Visit via Video Note  I connected with Stasia Cavalier on 04/30/22 at  2:00 PM EDT by a video enabled telemedicine application and verified that I am speaking with the correct person using two identifiers.  Location: Patient: Home Provider: Home Office   I discussed the limitations of evaluation and management by telemedicine and the availability of in person appointments. The patient expressed understanding and agreed to proceed.   I discussed the assessment and treatment plan with the patient. The patient was provided an opportunity to ask questions and all were answered. The patient agreed with the plan and demonstrated an understanding of the instructions.   The patient was advised to call back or seek an in-person evaluation if the symptoms worsen or if the condition fails to improve as  anticipated.  I provided 30 minutes of non-face-to-face time during this encounter.   Gara Kroner, MD

## 2022-04-30 NOTE — Patient Outreach (Signed)
  Care Coordination   Follow Up Visit Note   04/30/2022  Name: AKIKO SCHEXNIDER MRN: [id number] DOB: 10-30-1955  ILEANA CHALUPA is a 66 y.o. year old female who sees Lindell Spar, MD for primary care. I spoke with [name] by phone today.  What matters to the patients health and wellness today?  Reduce and Manage Symptoms of Depression, Isolation and Loneliness.    Goals Addressed               This Visit's Progress     COMPLETED: Reduce and Manage Symptoms of Depression, Isolation and Loneliness. (pt-stated)   On track     Care Coordination Interventions:  Verbalization of Feelings Encouraged.  Emotional Support Provided. Cognitive Behavioral Therapy Initiated. Continue Building control surveyor, 1 Day Per Week, Every Wednesday, for 4 Hours Per Day, Through [facility]. Continue Participation in Specialized Exercise Program at the [facility], Every Monday, Wednesday and Friday, for 1 Hour. Continue Participation in [place] at the [facility], Every Tuesday and Thursday, for 3 Hours.  Congratulations on Approval for Motorized Wheelchair to [employer]. Continue to Receive Psychotropic Medication Management and Psychotherapeutic Counseling Services Monthly, Through Charlette Caffey, Psychiatrist with [facility] at [place].  ~ Follow-Up Appointment Scheduled on 04/30/2022 at 2:00 pm.         SDOH assessments and interventions completed:  Yes.  Care Coordination Interventions Activated:  Yes.   Care Coordination Interventions:  Yes, Provided.   Follow up plan: No Further Intervention Required.  Encounter Outcome:  Pt. Visit Completed.   Nat Christen, BSW, MSW, LCSW  Licensed Education officer, environmental Health System  Mailing [place] [address] [place], [place] [address] Physical Address-[address] [place], [place] [address] Toll Free Main #  [phone] Fax # [phone] Cell # [phone] [email or web address]

## 2022-06-09 ENCOUNTER — Telehealth: Payer: Self-pay | Admitting: Anesthesiology

## 2022-06-09 NOTE — Telephone Encounter (Signed)
Pt called stating she would like to let Dr Everlena Cooper know that yesterday 12/4 she was not able to move her legs/were very weak, was not able to stand on her own where she needed help to move around, the weakness got better overnight, she is feeling better today however she is very worried about this episode she had. Requests call back.

## 2022-06-09 NOTE — Telephone Encounter (Signed)
When people of chronic symptoms of MS, there may be fluctuations - sometimes a person may have a day when they are weaker than other days.  If she is back to baseline, I don't think any acute management is warranted.    Patient advised of Dr.Jaffe note above

## 2022-06-10 ENCOUNTER — Other Ambulatory Visit: Payer: Self-pay | Admitting: Neurology

## 2022-06-10 ENCOUNTER — Other Ambulatory Visit (HOSPITAL_COMMUNITY): Payer: Self-pay | Admitting: Psychiatry

## 2022-06-10 DIAGNOSIS — F332 Major depressive disorder, recurrent severe without psychotic features: Secondary | ICD-10-CM

## 2022-06-14 ENCOUNTER — Other Ambulatory Visit: Payer: Self-pay

## 2022-06-14 ENCOUNTER — Inpatient Hospital Stay (HOSPITAL_COMMUNITY)
Admission: EM | Admit: 2022-06-14 | Discharge: 2022-06-18 | DRG: 059 | Disposition: A | Discharge status: Skilled Nursing Facility | Payer: Medicare Other | Attending: Internal Medicine | Admitting: Internal Medicine

## 2022-06-14 ENCOUNTER — Emergency Department (HOSPITAL_COMMUNITY): Payer: Medicare Other

## 2022-06-14 ENCOUNTER — Encounter (HOSPITAL_COMMUNITY): Payer: Self-pay | Admitting: Emergency Medicine

## 2022-06-14 ENCOUNTER — Encounter (HOSPITAL_COMMUNITY): Payer: Self-pay

## 2022-06-14 DIAGNOSIS — M4854XA Collapsed vertebra, not elsewhere classified, thoracic region, initial encounter for fracture: Secondary | ICD-10-CM | POA: Diagnosis present

## 2022-06-14 DIAGNOSIS — M5136 Other intervertebral disc degeneration, lumbar region: Secondary | ICD-10-CM | POA: Diagnosis not present

## 2022-06-14 DIAGNOSIS — Z7983 Long term (current) use of bisphosphonates: Secondary | ICD-10-CM

## 2022-06-14 DIAGNOSIS — R159 Full incontinence of feces: Secondary | ICD-10-CM | POA: Diagnosis present

## 2022-06-14 DIAGNOSIS — Z79890 Hormone replacement therapy (postmenopausal): Secondary | ICD-10-CM

## 2022-06-14 DIAGNOSIS — Z885 Allergy status to narcotic agent status: Secondary | ICD-10-CM

## 2022-06-14 DIAGNOSIS — Z9151 Personal history of suicidal behavior: Secondary | ICD-10-CM

## 2022-06-14 DIAGNOSIS — R64 Cachexia: Secondary | ICD-10-CM | POA: Diagnosis present

## 2022-06-14 DIAGNOSIS — G8929 Other chronic pain: Secondary | ICD-10-CM

## 2022-06-14 DIAGNOSIS — Z79899 Other long term (current) drug therapy: Secondary | ICD-10-CM

## 2022-06-14 DIAGNOSIS — M545 Low back pain, unspecified: Secondary | ICD-10-CM | POA: Diagnosis present

## 2022-06-14 DIAGNOSIS — E559 Vitamin D deficiency, unspecified: Secondary | ICD-10-CM | POA: Diagnosis present

## 2022-06-14 DIAGNOSIS — R296 Repeated falls: Secondary | ICD-10-CM

## 2022-06-14 DIAGNOSIS — F331 Major depressive disorder, recurrent, moderate: Secondary | ICD-10-CM | POA: Diagnosis present

## 2022-06-14 DIAGNOSIS — E785 Hyperlipidemia, unspecified: Secondary | ICD-10-CM | POA: Diagnosis present

## 2022-06-14 DIAGNOSIS — G43909 Migraine, unspecified, not intractable, without status migrainosus: Secondary | ICD-10-CM | POA: Diagnosis present

## 2022-06-14 DIAGNOSIS — R29898 Other symptoms and signs involving the musculoskeletal system: Secondary | ICD-10-CM | POA: Diagnosis not present

## 2022-06-14 DIAGNOSIS — M48061 Spinal stenosis, lumbar region without neurogenic claudication: Secondary | ICD-10-CM | POA: Diagnosis present

## 2022-06-14 DIAGNOSIS — R5381 Other malaise: Secondary | ICD-10-CM | POA: Diagnosis present

## 2022-06-14 DIAGNOSIS — G35 Multiple sclerosis: Secondary | ICD-10-CM | POA: Diagnosis not present

## 2022-06-14 DIAGNOSIS — G35D Multiple sclerosis, unspecified: Secondary | ICD-10-CM | POA: Diagnosis present

## 2022-06-14 DIAGNOSIS — S22000A Wedge compression fracture of unspecified thoracic vertebra, initial encounter for closed fracture: Secondary | ICD-10-CM | POA: Diagnosis present

## 2022-06-14 DIAGNOSIS — M810 Age-related osteoporosis without current pathological fracture: Secondary | ICD-10-CM | POA: Diagnosis present

## 2022-06-14 DIAGNOSIS — M51369 Other intervertebral disc degeneration, lumbar region without mention of lumbar back pain or lower extremity pain: Secondary | ICD-10-CM | POA: Diagnosis present

## 2022-06-14 DIAGNOSIS — Z6822 Body mass index (BMI) 22.0-22.9, adult: Secondary | ICD-10-CM

## 2022-06-14 DIAGNOSIS — R1314 Dysphagia, pharyngoesophageal phase: Secondary | ICD-10-CM | POA: Diagnosis present

## 2022-06-14 DIAGNOSIS — E039 Hypothyroidism, unspecified: Secondary | ICD-10-CM | POA: Diagnosis present

## 2022-06-14 DIAGNOSIS — R32 Unspecified urinary incontinence: Secondary | ICD-10-CM | POA: Diagnosis present

## 2022-06-14 LAB — URINALYSIS, ROUTINE W REFLEX MICROSCOPIC
Bilirubin Urine: NEGATIVE
Glucose, UA: NEGATIVE mg/dL
Hgb urine dipstick: NEGATIVE
Ketones, ur: NEGATIVE mg/dL
Leukocytes,Ua: NEGATIVE
Nitrite: NEGATIVE
Protein, ur: NEGATIVE mg/dL
Specific Gravity, Urine: 1.017 (ref 1.005–1.030)
pH: 8 (ref 5.0–8.0)

## 2022-06-14 LAB — CBC WITH DIFFERENTIAL/PLATELET
Abs Immature Granulocytes: 0.02 10*3/uL (ref 0.00–0.07)
Basophils Absolute: 0 10*3/uL (ref 0.0–0.1)
Basophils Relative: 0 %
Eosinophils Absolute: 0 10*3/uL (ref 0.0–0.5)
Eosinophils Relative: 0 %
HCT: 40.6 % (ref 36.0–46.0)
Hemoglobin: 13.2 g/dL (ref 12.0–15.0)
Immature Granulocytes: 0 %
Lymphocytes Relative: 2 %
Lymphs Abs: 0.1 10*3/uL — ABNORMAL LOW (ref 0.7–4.0)
MCH: 30.9 pg (ref 26.0–34.0)
MCHC: 32.5 g/dL (ref 30.0–36.0)
MCV: 95.1 fL (ref 80.0–100.0)
Monocytes Absolute: 0.7 10*3/uL (ref 0.1–1.0)
Monocytes Relative: 13 %
Neutro Abs: 4.4 10*3/uL (ref 1.7–7.7)
Neutrophils Relative %: 85 %
Platelets: 197 10*3/uL (ref 150–400)
RBC: 4.27 MIL/uL (ref 3.87–5.11)
RDW: 13.1 % (ref 11.5–15.5)
WBC: 5.3 10*3/uL (ref 4.0–10.5)
nRBC: 0 % (ref 0.0–0.2)

## 2022-06-14 LAB — CBC
HCT: 40.7 % (ref 36.0–46.0)
Hemoglobin: 13.3 g/dL (ref 12.0–15.0)
MCH: 30.7 pg (ref 26.0–34.0)
MCHC: 32.7 g/dL (ref 30.0–36.0)
MCV: 94 fL (ref 80.0–100.0)
Platelets: 200 10*3/uL (ref 150–400)
RBC: 4.33 MIL/uL (ref 3.87–5.11)
RDW: 12.9 % (ref 11.5–15.5)
WBC: 4.7 10*3/uL (ref 4.0–10.5)
nRBC: 0 % (ref 0.0–0.2)

## 2022-06-14 LAB — COMPREHENSIVE METABOLIC PANEL
ALT: 22 U/L (ref 0–44)
AST: 28 U/L (ref 15–41)
Albumin: 4.1 g/dL (ref 3.5–5.0)
Alkaline Phosphatase: 104 U/L (ref 38–126)
Anion gap: 7 (ref 5–15)
BUN: 16 mg/dL (ref 8–23)
CO2: 23 mmol/L (ref 22–32)
Calcium: 8.9 mg/dL (ref 8.9–10.3)
Chloride: 109 mmol/L (ref 98–111)
Creatinine, Ser: 0.74 mg/dL (ref 0.44–1.00)
GFR, Estimated: >60 mL/min (ref >60)
Glucose, Bld: 122 mg/dL — ABNORMAL HIGH (ref 70–99)
Potassium: 4.1 mmol/L (ref 3.5–5.1)
Sodium: 139 mmol/L (ref 135–145)
Total Bilirubin: 0.6 mg/dL (ref 0.3–1.2)
Total Protein: 6.8 g/dL (ref 6.5–8.1)

## 2022-06-14 LAB — CREATININE, SERUM
Creatinine, Ser: 0.72 mg/dL (ref 0.44–1.00)
GFR, Estimated: >60 mL/min (ref >60)

## 2022-06-14 LAB — CBG MONITORING, ED: Glucose-Capillary: 127 mg/dL — ABNORMAL HIGH (ref 70–99)

## 2022-06-14 MED ORDER — HYDRALAZINE HCL 20 MG/ML IJ SOLN: 5.0000 mg | INTRAMUSCULAR | Status: DC | PRN

## 2022-06-14 MED ORDER — ZONISAMIDE 100 MG PO CAPS
100.0000 mg | ORAL_CAPSULE | Freq: Every morning | ORAL | Status: DC
  Administered 2022-06-15 – 2022-06-18 (×4): 100 mg via ORAL
  Filled 2022-06-14 (×5): qty 1

## 2022-06-14 MED ORDER — HYDROCODONE-ACETAMINOPHEN 5-325 MG PO TABS
1.0000 | ORAL_TABLET | Freq: Once | ORAL | Status: AC
  Administered 2022-06-14: 1 via ORAL
  Filled 2022-06-14: qty 1

## 2022-06-14 MED ORDER — SIPONIMOD FUMARATE 1 MG PO TABS: 1.0000 mg | ORAL_TABLET | Freq: Every morning | ORAL | Status: DC

## 2022-06-14 MED ORDER — SIPONIMOD FUMARATE 1 MG PO TABS
1.0000 mg | ORAL_TABLET | Freq: Every morning | ORAL | Status: DC
  Administered 2022-06-15 – 2022-06-18 (×4): 1 mg via ORAL
  Filled 2022-06-14 (×2): qty 1

## 2022-06-14 MED ORDER — SUMATRIPTAN SUCCINATE 6 MG/0.5ML ~~LOC~~ SOLN
6.0000 mg | Freq: Once | SUBCUTANEOUS | Status: AC
  Administered 2022-06-14: 6 mg via SUBCUTANEOUS
  Filled 2022-06-14: qty 0.5

## 2022-06-14 MED ORDER — SIMVASTATIN 20 MG PO TABS
20.0000 mg | ORAL_TABLET | Freq: Every day | ORAL | Status: DC
  Administered 2022-06-14 – 2022-06-17 (×4): 20 mg via ORAL
  Filled 2022-06-14: qty 2
  Filled 2022-06-14 (×3): qty 1

## 2022-06-14 MED ORDER — ONDANSETRON HCL 4 MG PO TABS: 4.0000 mg | ORAL_TABLET | Freq: Four times a day (QID) | ORAL | Status: DC | PRN

## 2022-06-14 MED ORDER — OXYCODONE HCL 5 MG PO TABS
5.0000 mg | ORAL_TABLET | Freq: Four times a day (QID) | ORAL | Status: DC | PRN
  Administered 2022-06-15 – 2022-06-17 (×7): 5 mg via ORAL
  Filled 2022-06-14 (×7): qty 1

## 2022-06-14 MED ORDER — ACETAMINOPHEN 325 MG PO TABS: 650.0000 mg | ORAL_TABLET | Freq: Four times a day (QID) | ORAL | Status: DC | PRN

## 2022-06-14 MED ORDER — FENTANYL CITRATE PF 50 MCG/ML IJ SOSY
12.5000 ug | PREFILLED_SYRINGE | INTRAMUSCULAR | Status: DC | PRN
  Administered 2022-06-16: 12.5 ug via INTRAVENOUS
  Filled 2022-06-14: qty 1

## 2022-06-14 MED ORDER — ONDANSETRON 4 MG PO TBDP
4.0000 mg | ORAL_TABLET | Freq: Once | ORAL | Status: AC
  Administered 2022-06-14: 4 mg via ORAL
  Filled 2022-06-14: qty 1

## 2022-06-14 MED ORDER — ZOLPIDEM TARTRATE 5 MG PO TABS
5.0000 mg | ORAL_TABLET | Freq: Every evening | ORAL | Status: DC | PRN
  Administered 2022-06-16: 5 mg via ORAL
  Filled 2022-06-14: qty 1

## 2022-06-14 MED ORDER — BISACODYL 5 MG PO TBEC
5.0000 mg | DELAYED_RELEASE_TABLET | Freq: Every day | ORAL | Status: DC | PRN
  Administered 2022-06-16: 5 mg via ORAL
  Filled 2022-06-14: qty 1

## 2022-06-14 MED ORDER — SERTRALINE HCL 50 MG PO TABS
25.0000 mg | ORAL_TABLET | Freq: Every morning | ORAL | Status: DC
  Administered 2022-06-15 – 2022-06-18 (×4): 25 mg via ORAL
  Filled 2022-06-14 (×4): qty 1

## 2022-06-14 MED ORDER — ENOXAPARIN SODIUM 40 MG/0.4ML IJ SOSY
40.0000 mg | PREFILLED_SYRINGE | INTRAMUSCULAR | Status: DC
  Administered 2022-06-15 – 2022-06-18 (×4): 40 mg via SUBCUTANEOUS
  Filled 2022-06-14 (×4): qty 0.4

## 2022-06-14 MED ORDER — LEVOTHYROXINE SODIUM 50 MCG PO TABS
50.0000 ug | ORAL_TABLET | Freq: Every day | ORAL | Status: DC
  Administered 2022-06-15 – 2022-06-18 (×4): 50 ug via ORAL
  Filled 2022-06-14 (×4): qty 1

## 2022-06-14 MED ORDER — ONDANSETRON HCL 4 MG/2ML IJ SOLN: 4.0000 mg | Freq: Four times a day (QID) | INTRAMUSCULAR | Status: DC | PRN

## 2022-06-14 MED ORDER — ACETAMINOPHEN 650 MG RE SUPP: 650.0000 mg | Freq: Four times a day (QID) | RECTAL | Status: DC | PRN

## 2022-06-14 NOTE — ED Triage Notes (Addendum)
Pt c/o bilateral leg weakness. Denies any loss of sensation. Pt has active dx of MS and is followed by [facility]. Pt reports she is in the process of changing medication from PM to AM. Pt called 911 due to inability to ambulate to the restroom. RN is recommending a TOC, PT and OT consult.

## 2022-06-14 NOTE — Hospital Course (Signed)
66 y/o female with longstanding MS and followed by Dr. Everlena Cooper with [facility] since relocating from [place] presented to ED today with progressive weakness in lower extremities.  She is noted to have progressive multiple sclerosis, migraines, bowel and bladder dysfunction, chronic neuropathic pain, pseudo exacerbation related to heat and overexertion, vitamin D deficiency, hypothyroidism, hyperlipidemia, multiple falls.  She has degeneration of the lumbar disks, chronic low back pain, osteoporosis, pharyngeal esophageal dysphagia, spinal stenosis and thoracic compression fracture.  She reports that her baseline is that she is able to ambulate with the use of her motorized wheelchair with the assistance of her sister whom she lives with and she manages most of her ADLs.  She reports that she has been treated with Betaseron; Avonex; Tysabri from around 2011 until September 2020 due to positive JC virus antibody.  She says she had an 23-month period of taking no medication after that.  Patient reports that she has been taking Mayzent for maintenance of MS since July 2021.  She reportedly had a severe exacerbation of her MS several months ago requiring a high-dose prednisone taper and it was associated with a suicide attempt that she believes was related to the MS flare.  Patient has known chronic debilitating depression of chronic illness and takes antidepressants.    After completing the prednisone taper for the MS flare she felt like she was back to baseline.  Unfortunately approximately 1 week ago she had an episode where she had severe lower extremity weakness but it only lasted 1 day.  When she woke up the next morning she felt back to her baseline.  She reported she felt well for several days and then yesterday she became very weak in her lower extremities again but this was more severe than the prior episode.  She went to bed but when she woke up her symptoms had not resolved like they had before.   She was not able to get to the bathroom because she was so severely weak and in her legs.  She lost strength in her ankles and lower legs.   She reports intermittent bowel and bladder incontinence but this is not new.  Her main symptom to this as she has not been strong enough to lift her body onto a seat or chair to use the bathroom.  She reports that her vision and her hearing continues to deteriorate.  She was evaluated in the ED by Nehemiah Settle small who had a consultation with neurologist Dr. Selina Cooley who recommended that patient be admitted to [facility] for MRI brain, cervical and thoracic spine with and without contrast.

## 2022-06-14 NOTE — ED Provider Notes (Cosign Needed Addendum)
[facility] EMERGENCY DEPARTMENT Provider Note   CSN: [id number] Arrival date & time: 06/14/22  0848     History  Chief Complaint  Patient presents with   Extremity Weakness    Bilateral leg weakness    Pamela Roman is a 66 y.o. female, history of MS, who presents to the ED secondary to complaint of bilateral lower extremity weakness that has been going on for the past day.  She states that she had an episode of this couple days ago, but it resolved after she went to bed 1 night.  Has not been able to use her power chair given her reduced mobility, and weakness of legs.  Notes that she did have 1 episode of urinary incontinence yesterday, but has not had any bowel incontinence, or other episodes of urinary incontinence ever since.  Notes that she feels very weak bilateral lower extremities, no increased visual deficits, headache (that is different than her chronic migraine), worsening back pain.  She does note chronic groin pain, but she states it just hurts more when she presses on her groin.  No other complaints.  Denies any drooping of the face difficulty with speech, dizziness.  States left-sided body is typically weaker, but both are more weak than usual.  Follows with [facility] neurology.  Called EMS that she is unable to ambulate to the bathroom.  Denies any recent falls. Last fall 1 month ago per pt.    Home Medications Prior to Admission medications   Medication Sig Start Date End Date Taking? Authorizing Provider  alendronate (FOSAMAX) 70 MG tablet TAKE 1 TABLET ONCE A WEEK WITH A FULL GLASS OF WATER ON AN EMPTY STOMACH Patient taking differently: Take 70 mg by mouth once a week. 01/26/22   Anabel Halon, MD  cyanocobalamin (VITAMIN B12) 100 MCG tablet Take 100 mcg by mouth daily.    [provider]  levothyroxine (SYNTHROID) 50 MCG tablet TAKE 1 TABLET DAILY BEFORE BREAKFAST 02/11/22   Anabel Halon, MD  predniSONE (DELTASONE) 50 MG tablet take 10 tablets for days 1  to 3, then 8 tablets on day 4, then 6 tablets on day 5, then 4 tablets on day 6, then 2 tablets on day 7, then 1 tablet on day 8, then 1/2 tablet on day 9 03/19/22   Everlena Cooper, Adam R, DO  sertraline (ZOLOFT) 25 MG tablet TAKE 1 TABLET DAILY 06/14/22   Stasia Cavalier, MD  simvastatin (ZOCOR) 20 MG tablet TAKE 1 TABLET DAILY 02/11/22   Anabel Halon, MD  Siponimod Fumarate (MAYZENT) 1 MG TABS TAKE 1 TABLET (1 MG) DAILY 03/17/22   Everlena Cooper, Adam R, DO  SUMAtriptan (IMITREX) 100 MG tablet TAKE 1 TABLET EVERY 2 HOURS AS NEEDED FOR MIGRAINE. MAY REPEAT IN 2 HOURS IF HEADACHE PERSISTS OR RECURS Patient taking differently: Take 100 mg by mouth every 2 (two) hours as needed for migraine. 05/21/21   Drema Dallas, DO  Vitamin D, Ergocalciferol, (DRISDOL) 1.25 MG (50000 UNIT) CAPS capsule TAKE 1 CAPSULE EVERY 7 DAYS 04/25/21   Drema Dallas, DO  zonisamide (ZONEGRAN) 100 MG capsule Take 1 capsule (100 mg total) by mouth daily. 04/16/22   Drema Dallas, DO      Allergies    Codeine    Review of Systems   Review of Systems  Musculoskeletal:  Positive for extremity weakness.  Neurological:  Positive for weakness. Negative for dizziness, facial asymmetry and numbness.    Physical Exam Updated Vital Signs  BP 113/72 (BP Location: Left Arm)   Pulse 78   Temp 98.7 F (37.1 C) (Oral)   Resp 14   Ht 5\' 3"  (1.6 m)   Wt 57.2 kg   SpO2 100%   BMI 22.34 kg/m  Physical Exam Vitals and nursing note reviewed.  Constitutional:      General: She is not in acute distress.    Appearance: She is well-developed.  HENT:     Head: Normocephalic and atraumatic.  Eyes:     Conjunctiva/sclera: Conjunctivae normal.  Cardiovascular:     Rate and Rhythm: Normal rate and regular rhythm.     Heart sounds: No murmur heard. Pulmonary:     Effort: Pulmonary effort is normal. No respiratory distress.     Breath sounds: Normal breath sounds.  Abdominal:     Palpations: Abdomen is soft.     Tenderness: There is no abdominal  tenderness.  Musculoskeletal:        General: No swelling.     Cervical back: Neck supple.     Comments: +TTP of ASIS of R hip. No stepoffs.   Skin:    General: Skin is warm and dry.     Capillary Refill: Capillary refill takes less than 2 seconds.     Comments: +multiple ecchymoses on BLE, left foot  Neurological:     Mental Status: She is alert and oriented to person, place, and time.     Sensory: No sensory deficit.     Motor: Weakness present.     Coordination: Finger-Nose-Finger Test normal.     Comments: 3/5 strength of RLE, 4/5 strength of LLE. Decreased sensation of LLE. Sensation intact of RLE.   Psychiatric:        Mood and Affect: Mood normal.     ED Results / Procedures / Treatments   Labs (all labs ordered are listed, but only abnormal results are displayed) Labs Reviewed  CBC WITH DIFFERENTIAL/PLATELET - Abnormal; Notable for the following components:      Result Value   Lymphs Abs 0.1 (*)    All other components within normal limits  COMPREHENSIVE METABOLIC PANEL - Abnormal; Notable for the following components:   Glucose, Bld 122 (*)    All other components within normal limits  URINALYSIS, ROUTINE W REFLEX MICROSCOPIC - Abnormal; Notable for the following components:   APPearance CLOUDY (*)    All other components within normal limits  CBG MONITORING, ED - Abnormal; Notable for the following components:   Glucose-Capillary 127 (*)    All other components within normal limits    EKG EKG Interpretation  Date/Time:  Sunday June 14 2022 08:57:24 EST Ventricular Rate:  76 PR Interval:  161 QRS Duration: 101 QT Interval:  385 QTC Calculation: 433 R Axis:   75 Text Interpretation: Sinus rhythm no acute ST/T changes similar to Aug 2023 Confirmed by Pricilla Loveless [phone]) on 06/14/2022 9:25:23 AM  Radiology DG Hip Unilat W or Wo Pelvis 2-3 Views Right  Result Date: 06/14/2022 CLINICAL DATA:  Bilateral leg weakness. EXAM: DG HIP (WITH OR WITHOUT  PELVIS) 2-3V RIGHT COMPARISON:  None Available. FINDINGS: There is no evidence of hip fracture or dislocation. Degenerative disc disease noted within the imaged portions of the lumbar spine. Moderate retained stool identified within the rectum. Calcified uterine fibroid noted in the pelvis. IMPRESSION: 1. No acute findings. 2. Calcified uterine fibroid. 3. Moderate retained stool within the rectum. Electronically Signed   By: Signa Kell M.D.   On: 06/14/2022  10:20   CT Lumbar Spine Wo Contrast  Result Date: 06/14/2022 CLINICAL DATA:  Colles weakness. New incontinence. Patient has a history of a mass. EXAM: CT LUMBAR SPINE WITHOUT CONTRAST TECHNIQUE: Multidetector CT imaging of the lumbar spine was performed without intravenous contrast administration. Multiplanar CT image reconstructions were also generated. RADIATION DOSE REDUCTION: This exam was performed according to the departmental dose-optimization program which includes automated exposure control, adjustment of the mA and/or kV according to patient size and/or use of iterative reconstruction technique. COMPARISON:  Thoracic spine MRI dated 12/31/2021. FINDINGS: Segmentation: 5 lumbar type vertebrae. Alignment: Slight retrolisthesis of L2 on L3. Mild levoscoliosis, apex at L2-L3. Vertebrae: Chronic fractures of T11, T12, L1 and L2, T12 and L1 previously treated with vertebroplasty. These findings are stable from the prior thoracic MRI. No other fractures.  No bone lesions. Paraspinal and other soft tissues: No soft tissue mass. No spinal canal mass or fluid collection. Skin aortic atherosclerotic calcifications. Disc levels: T12-L1: Posterior aspect of the fractured L1 vertebra mildly indents the ventral thecal sac, without significant spinal canal narrowing. Neural foramina are well preserved. No disc bulging or disc herniation. L1-L2: No disc bulging. No evidence of a disc herniation. Central spinal canal and neural foramina are well preserved.  L2-L3: Lower endplate of the fractured L2 vertebra mildly indents the ventral thecal sac without significant spinal canal narrowing. Moderate bilateral neural foraminal narrowing due to a combination of the prominent lower endplate and facet degenerative changes and the mild degenerative retrolisthesis. No convincing disc herniation. L3-L4: Minor disc bulging. No disc herniation. No significant stenosis. L4-L5: Mild diffuse disc bulging with endplate spurring. Mild facet degenerative change. Mild narrowing of the central spinal canal. Lateral recesses relatively well preserved. Moderate bilateral neural foraminal narrowing. No disc herniation. L5-S1: Minor disc bulging. No disc herniation. No significant stenosis. IMPRESSION: 1. No acute fracture or acute finding. 2. Chronic fractures of T11, T12, L1 and L2. 3. Degenerative changes as detailed. No disc herniation. Mild central spinal canal narrowing at L4-L5. No findings, however, to account for new incontinence. Electronically Signed   By: Amie Portland M.D.   On: 06/14/2022 10:00   CT Head Wo Contrast  Result Date: 06/14/2022 CLINICAL DATA:  History of multiple sclerosis. Worsening bilateral lower extremity weakness. EXAM: CT HEAD WITHOUT CONTRAST TECHNIQUE: Contiguous axial images were obtained from the base of the skull through the vertex without intravenous contrast. RADIATION DOSE REDUCTION: This exam was performed according to the departmental dose-optimization program which includes automated exposure control, adjustment of the mA and/or kV according to patient size and/or use of iterative reconstruction technique. COMPARISON:  Brain MRI, 12/31/2021. FINDINGS: Brain: No evidence of acute infarction, hemorrhage, hydrocephalus, extra-axial collection or mass lesion/mass effect. Bilateral patchy areas of periventricular white matter hypoattenuation consistent with MS plaques and without significant change from the prior brain MRI. Vascular: No hyperdense  vessel or unexpected calcification. Skull: Normal. Negative for fracture or focal lesion. Sinuses/Orbits: Globes and orbits are unremarkable. Mild scattered ethmoid sinus mucosal thickening. Other: None. IMPRESSION: 1. No acute intracranial abnormalities. 2. White matter changes consistent with the history of MS, and without significant change from the brain MRI dated 12/31/2021. Electronically Signed   By: Amie Portland M.D.   On: 06/14/2022 09:53    Procedures Procedures    Medications Ordered in ED Medications  HYDROcodone-acetaminophen (NORCO/VICODIN) 5-325 MG per tablet 1 tablet (has no administration in time range)  SUMAtriptan (IMITREX) injection 6 mg (6 mg Subcutaneous Given 06/14/22 1139)  ondansetron (  ZOFRAN-ODT) disintegrating tablet 4 mg (4 mg Oral Given 06/14/22 1136)    ED Course/ Medical Decision Making/ A&P                           Medical Decision Making Patient presents to the ED secondary to bilateral leg weakness, is unable to transfer to her power chair given reduced mobility and weakness of legs.  Also reports 1 episode of urinary incontinence.  Denies any increased back pain, discussed with patient, we will obtain CT lumbar spine, to eval for stenosis, additionally CT head for further evaluation of neurosymptoms.  Labs including urinalysis to eval we wait for UTI.  Amount and/or Complexity of Data Reviewed Labs: ordered.    Details: Unremarkable Radiology: ordered.    Details: No acute changes. Discussion of management or test interpretation with external provider(s): Discussed with Dr. Selina Cooley, neurology, she recommends MRI of brain, cervical, thoracic, with and without, discussed with Dr. Gwenlyn Fudge he recommends to admit to hospitalist, for transfer and secondary to patient inability to go home, secondary to inability to move from bed to power chair.  Admitted to Dr. Laural Benes, with plan to transfer to [facility], for MRI brain, cervical, thoracic with and without.  She will  need further neuroevaluation, as well as the MRI and she is unable to go home after this secondary to limited ADLs thus admission is required.  Risk Prescription drug management. Decision regarding hospitalization.   Final Clinical Impression(s) / ED Diagnoses Final diagnoses:  Bilateral leg weakness    Rx / DC Orders ED Discharge Orders     None         Cherylee Rawlinson, [place], PA 06/14/22 1410    Marcea Rojek, [place], [place] 06/14/22 1410    Pricilla Loveless, MD 06/17/22 [phone]

## 2022-06-14 NOTE — ED Notes (Signed)
Pt states she uses a wheelchair to get around at home

## 2022-06-14 NOTE — H&P (Signed)
History and Physical  [place] [id number] DOB: January 07, 1956 DOA: 06/14/2022  PCP: Lindell Spar, MD  Patient coming from: Home by EMS  Level of care: Telemetry Medical  I have personally briefly reviewed patient's old medical records in [place]  Chief Complaint: progressive weakness in legs, inability to ambulate  HPI: Pamela Roman is a 66 y/o female with longstanding MS and followed by Dr. Tomi Likens with [facility] since relocating from [place] presented to ED today with progressive weakness in lower extremities.  She is noted to have progressive multiple sclerosis, migraines, bowel and bladder dysfunction, chronic neuropathic pain, pseudo exacerbation related to heat and overexertion, vitamin D deficiency, hypothyroidism, hyperlipidemia, multiple falls.  She has degeneration of the lumbar disks, chronic low back pain, osteoporosis, pharyngeal esophageal dysphagia, spinal stenosis and thoracic compression fracture.  She reports that her baseline is that she is able to ambulate with the use of her motorized wheelchair with the assistance of her sister whom she lives with and she manages most of her ADLs.  She reports that she has been treated with Betaseron; Avonex; Tysabri from around 2011 until September 2020 due to positive JC virus antibody.  She says she had an 103-month period of taking no medication after that.  Patient reports that she has been taking Mayzent for maintenance of MS since July 2021.  She reportedly had a severe exacerbation of her MS several months ago requiring a high-dose prednisone taper and it was associated with a suicide attempt that she believes was related to the MS flare.  Patient has known chronic debilitating depression of chronic illness and takes antidepressants.    After completing the prednisone taper for the MS flare she felt like she was back to baseline.  Unfortunately approximately 1 week ago she had an episode where  she had severe lower extremity weakness but it only lasted 1 day.  When she woke up the next morning she felt back to her baseline.  She reported she felt well for several days and then yesterday she became very weak in her lower extremities again but this was more severe than the prior episode.  She went to bed but when she woke up her symptoms had not resolved like they had before.  She was not able to get to the bathroom because she was so severely weak and in her legs.  She lost strength in her ankles and lower legs.   She reports intermittent bowel and bladder incontinence but this is not new.  Her main symptom to this as she has not been strong enough to lift her body onto a seat or chair to use the bathroom.  She reports that her vision and her hearing continues to deteriorate.  She was evaluated in the ED by Jerene Pitch small who had a consultation with neurologist Dr. Quinn Axe who recommended that patient be admitted to [facility] for MRI brain, cervical and thoracic spine with and without contrast.     Past Medical History:  Diagnosis Date   Depression    Phreesia 08/06/2020   Hx of migraines    Hyperglycemia    Hypothyroidism    Migraines    Multiple sclerosis [facility])     Past Surgical History:  Procedure Laterality Date   CATARACT EXTRACTION W/PHACO Left 01/01/2020   Procedure: CATARACT EXTRACTION PHACO AND INTRAOCULAR LENS PLACEMENT LEFT EYE;  Surgeon: Baruch Goldmann, MD;  Location: [facility];  Service: Ophthalmology;  Laterality: Left;  CDE: 3.37   CATARACT EXTRACTION W/PHACO Right 01/19/2020   Procedure: CATARACT EXTRACTION PHACO AND INTRAOCULAR LENS PLACEMENT RIGHT EYE;  Surgeon: Baruch Goldmann, MD;  Location: [facility];  Service: Ophthalmology;  Laterality: Right;  CDE: 3.16   TONSILLECTOMY       reports that she has never smoked. She has never been exposed to tobacco smoke. She has never used smokeless tobacco. She reports that she does not currently use alcohol. She reports that she  does not use drugs.  Allergies  Allergen Reactions   Codeine Nausea And Vomiting    Family History  Problem Relation Age of Onset   Dementia Mother    Dementia Father     Prior to Admission medications   Medication Sig Start Date End Date Taking? Authorizing Provider  alendronate (FOSAMAX) 70 MG tablet TAKE 1 TABLET ONCE A WEEK WITH A FULL GLASS OF WATER ON AN EMPTY STOMACH Patient taking differently: Take 70 mg by mouth once a week. 01/26/22   Lindell Spar, MD  cyanocobalamin (VITAMIN B12) 100 MCG tablet Take 100 mcg by mouth daily.    [provider]  levothyroxine (SYNTHROID) 50 MCG tablet TAKE 1 TABLET DAILY BEFORE BREAKFAST 02/11/22   Lindell Spar, MD  predniSONE (DELTASONE) 50 MG tablet take 10 tablets for days 1 to 3, then 8 tablets on day 4, then 6 tablets on day 5, then 4 tablets on day 6, then 2 tablets on day 7, then 1 tablet on day 8, then 1/2 tablet on day 9 03/19/22   Tomi Likens, Adam R, DO  sertraline (ZOLOFT) 25 MG tablet TAKE 1 TABLET DAILY 06/14/22   Vista Mink, MD  simvastatin (ZOCOR) 20 MG tablet TAKE 1 TABLET DAILY 02/11/22   Lindell Spar, MD  Siponimod Fumarate (MAYZENT) 1 MG TABS TAKE 1 TABLET (1 MG) DAILY 03/17/22   Tomi Likens, Adam R, DO  SUMAtriptan (IMITREX) 100 MG tablet TAKE 1 TABLET EVERY 2 HOURS AS NEEDED FOR MIGRAINE. MAY REPEAT IN 2 HOURS IF HEADACHE PERSISTS OR RECURS Patient taking differently: Take 100 mg by mouth every 2 (two) hours as needed for migraine. 05/21/21   Pieter Partridge, DO  Vitamin D, Ergocalciferol, (DRISDOL) 1.25 MG (50000 UNIT) CAPS capsule TAKE 1 CAPSULE EVERY 7 DAYS 04/25/21   Pieter Partridge, DO  zonisamide (ZONEGRAN) 100 MG capsule Take 1 capsule (100 mg total) by mouth daily. 04/16/22   Pieter Partridge, DO    Physical Exam: Vitals:   06/14/22 1200 06/14/22 1230 06/14/22 1251 06/14/22 1311  BP: (!) 163/83 (!) 154/87  113/72  Pulse: 72 74  78  Resp: 15 13  14   Temp:   98.4 F (36.9 C) 98.7 F (37.1 C)  TempSrc:   Oral Oral   SpO2: 99% 98%  100%  Weight:      Height:       Constitutional: cachectic appearing female, awake, alert, oriented x 3, NAD, calm, comfortable Eyes: PERRL, lids and conjunctivae normal ENMT: Mucous membranes are moist. Posterior pharynx clear of any exudate or lesions.Normal dentition.  Neck: normal, supple, no masses, no thyromegaly Respiratory: clear to auscultation bilaterally, no wheezing, no crackles. Normal respiratory effort. No accessory muscle use.  Cardiovascular: normal s1, s2 sounds, no murmurs / rubs / gallops. No extremity edema. 2+ pedal pulses. No carotid bruits.  Abdomen: no tenderness, no masses palpated. No hepatosplenomegaly. Bowel sounds positive.  Musculoskeletal: no clubbing / cyanosis. No joint deformity upper and lower extremities. Good ROM, no  contractures. Diminished muscle tone in bilateral LEs.   Skin: no rashes, lesions, ulcers. No induration Neurologic: CN 2-12 grossly intact. Sensation diminished LLE. Strength 3-4/5 in RLE/4/5 LLE.  Psychiatric: Normal judgment and insight. Alert and oriented x 3. Normal mood.   Labs on Admission: I have personally reviewed following labs and imaging studies  CBC: Recent Labs  Lab 06/14/22 0948  WBC 5.3  NEUTROABS 4.4  HGB 13.2  HCT 40.6  MCV 95.1  PLT [id number]   Basic Metabolic Panel: Recent Labs  Lab 06/14/22 0948  NA 139  K 4.1  CL 109  CO2 23  GLUCOSE 122*  BUN 16  CREATININE 0.74  CALCIUM 8.9   GFR: Estimated Creatinine Clearance: 58 mL/min (by C-G formula based on SCr of 0.74 mg/dL). Liver Function Tests: Recent Labs  Lab 06/14/22 0948  AST 28  ALT 22  ALKPHOS 104  BILITOT 0.6  PROT 6.8  ALBUMIN 4.1   No results for input(s): "LIPASE", "AMYLASE" in the last 168 hours. No results for input(s): "AMMONIA" in the last 168 hours. Coagulation Profile: No results for input(s): "INR", "PROTIME" in the last 168 hours. Cardiac Enzymes: No results for input(s): "CKTOTAL", "CKMB", "CKMBINDEX",  "TROPONINI" in the last 168 hours. BNP (last 3 results) No results for input(s): "PROBNP" in the last 8760 hours. HbA1C: No results for input(s): "HGBA1C" in the last 72 hours. CBG: Recent Labs  Lab 06/14/22 0900  GLUCAP 127*   Lipid Profile: No results for input(s): "CHOL", "HDL", "LDLCALC", "TRIG", "CHOLHDL", "LDLDIRECT" in the last 72 hours. Thyroid Function Tests: No results for input(s): "TSH", "T4TOTAL", "FREET4", "T3FREE", "THYROIDAB" in the last 72 hours. Anemia Panel: No results for input(s): "VITAMINB12", "FOLATE", "FERRITIN", "TIBC", "IRON", "RETICCTPCT" in the last 72 hours. Urine analysis:    Component Value Date/Time   COLORURINE YELLOW 06/14/2022 1057   APPEARANCEUR CLOUDY (A) 06/14/2022 1057   LABSPEC 1.017 06/14/2022 1057   PHURINE 8.0 06/14/2022 1057   GLUCOSEU NEGATIVE 06/14/2022 1057   HGBUR NEGATIVE 06/14/2022 1057   BILIRUBINUR NEGATIVE 06/14/2022 1057   KETONESUR NEGATIVE 06/14/2022 1057   PROTEINUR NEGATIVE 06/14/2022 1057   NITRITE NEGATIVE 06/14/2022 1057   LEUKOCYTESUR NEGATIVE 06/14/2022 1057    Radiological Exams on Admission: DG Hip Unilat W or Wo Pelvis 2-3 Views Right  Result Date: 06/14/2022 CLINICAL DATA:  Bilateral leg weakness. EXAM: DG HIP (WITH OR WITHOUT PELVIS) 2-3V RIGHT COMPARISON:  None Available. FINDINGS: There is no evidence of hip fracture or dislocation. Degenerative disc disease noted within the imaged portions of the lumbar spine. Moderate retained stool identified within the rectum. Calcified uterine fibroid noted in the pelvis. IMPRESSION: 1. No acute findings. 2. Calcified uterine fibroid. 3. Moderate retained stool within the rectum. Electronically Signed   By: Kerby Moors M.D.   On: 06/14/2022 10:20   CT Lumbar Spine Wo Contrast  Result Date: 06/14/2022 CLINICAL DATA:  Colles weakness. New incontinence. Patient has a history of a mass. EXAM: CT LUMBAR SPINE WITHOUT CONTRAST TECHNIQUE: Multidetector CT imaging of the  lumbar spine was performed without intravenous contrast administration. Multiplanar CT image reconstructions were also generated. RADIATION DOSE REDUCTION: This exam was performed according to the departmental dose-optimization program which includes automated exposure control, adjustment of the mA and/or kV according to patient size and/or use of iterative reconstruction technique. COMPARISON:  Thoracic spine MRI dated 12/31/2021. FINDINGS: Segmentation: 5 lumbar type vertebrae. Alignment: Slight retrolisthesis of L2 on L3. Mild levoscoliosis, apex at L2-L3. Vertebrae: Chronic fractures of T11, T12,  L1 and L2, T12 and L1 previously treated with vertebroplasty. These findings are stable from the prior thoracic MRI. No other fractures.  No bone lesions. Paraspinal and other soft tissues: No soft tissue mass. No spinal canal mass or fluid collection. Skin aortic atherosclerotic calcifications. Disc levels: T12-L1: Posterior aspect of the fractured L1 vertebra mildly indents the ventral thecal sac, without significant spinal canal narrowing. Neural foramina are well preserved. No disc bulging or disc herniation. L1-L2: No disc bulging. No evidence of a disc herniation. Central spinal canal and neural foramina are well preserved. L2-L3: Lower endplate of the fractured L2 vertebra mildly indents the ventral thecal sac without significant spinal canal narrowing. Moderate bilateral neural foraminal narrowing due to a combination of the prominent lower endplate and facet degenerative changes and the mild degenerative retrolisthesis. No convincing disc herniation. L3-L4: Minor disc bulging. No disc herniation. No significant stenosis. L4-L5: Mild diffuse disc bulging with endplate spurring. Mild facet degenerative change. Mild narrowing of the central spinal canal. Lateral recesses relatively well preserved. Moderate bilateral neural foraminal narrowing. No disc herniation. L5-S1: Minor disc bulging. No disc herniation. No  significant stenosis. IMPRESSION: 1. No acute fracture or acute finding. 2. Chronic fractures of T11, T12, L1 and L2. 3. Degenerative changes as detailed. No disc herniation. Mild central spinal canal narrowing at L4-L5. No findings, however, to account for new incontinence. Electronically Signed   By: Lajean Manes M.D.   On: 06/14/2022 10:00   CT Head Wo Contrast  Result Date: 06/14/2022 CLINICAL DATA:  History of multiple sclerosis. Worsening bilateral lower extremity weakness. EXAM: CT HEAD WITHOUT CONTRAST TECHNIQUE: Contiguous axial images were obtained from the base of the skull through the vertex without intravenous contrast. RADIATION DOSE REDUCTION: This exam was performed according to the departmental dose-optimization program which includes automated exposure control, adjustment of the mA and/or kV according to patient size and/or use of iterative reconstruction technique. COMPARISON:  Brain MRI, 12/31/2021. FINDINGS: Brain: No evidence of acute infarction, hemorrhage, hydrocephalus, extra-axial collection or mass lesion/mass effect. Bilateral patchy areas of periventricular white matter hypoattenuation consistent with MS plaques and without significant change from the prior brain MRI. Vascular: No hyperdense vessel or unexpected calcification. Skull: Normal. Negative for fracture or focal lesion. Sinuses/Orbits: Globes and orbits are unremarkable. Mild scattered ethmoid sinus mucosal thickening. Other: None. IMPRESSION: 1. No acute intracranial abnormalities. 2. White matter changes consistent with the history of MS, and without significant change from the brain MRI dated 12/31/2021. Electronically Signed   By: Lajean Manes M.D.   On: 06/14/2022 09:53    EKG: Independently reviewed.   Assessment/Plan Principal Problem:   Bilateral leg weakness Active Problems:   Migraine   Hypothyroidism   Multiple sclerosis (HCC)   HLD (hyperlipidemia)   Osteoporosis   Multiple falls   Thoracic  compression fracture (HCC)   Degeneration of lumbar intervertebral disc   Low back pain   Spinal stenosis of lumbar region   Pharyngoesophageal dysphagia   Physical deconditioning   MDD (major depressive disorder), recurrent episode, moderate (HCC)   Progressive Multiple Sclerosis - possibly an acute MS flare - discussed with Dr. Quinn Axe who recommended transfer to [facility] - MRI brain, Cspine, Tspine with and without contrast - neuro checks - neurology consultation  - further recommendations to follow  Progressive bilateral LE weakness  - possibly related to MS flare - obtain PT evaluation  - MRI studies ordered as above  - neurology consultation   Physical deconditioning/Frequent Falls - PT eval -  fall precautions recommended  Major Depressive Disorder - pt reports suicide attempt with last MS flare - continue home antidepressants  - pt denies suicidal thoughts or plans and contracted for safety  Hypothyroidism  - resume home levothyroxine  Pharyngoesophageal dysphagia  - chronic - dysphagia 3 diet ordered - aspiration precautions  - elevate head of bed   Chronic back pain / DDD  - pain medication ordered as needed   DVT prophylaxis: enoxaparin   Code Status: Full   Family Communication:   Disposition Plan: TBD   Consults called: neurology   Admission status: OBS  Level of care: Telemetry Medical Standley Dakins MD [facility] How to contact the [facility] Attending or Consulting provider 7A - 7P or covering provider during after hours 7P -7A, for this patient?  Check the care team in [facility] and look for a) attending/consulting [facility] provider listed and b) the [facility] team listed Log into [email or web address] and use [place]'s universal password to access. If you do not have the password, please contact the hospital operator. Locate the [facility] provider you are looking for under [facility] and page to a number that you can be directly reached. If you still have difficulty  reaching the provider, please page the [facility] (Director on Call) for the Hospitalists listed on amion for assistance.   If 7PM-7AM, please contact night-coverage [email or web address] Password [email or web address]  06/14/2022, 2:35 PM

## 2022-06-15 ENCOUNTER — Observation Stay (HOSPITAL_COMMUNITY): Payer: Medicare Other

## 2022-06-15 ENCOUNTER — Telehealth: Payer: Self-pay | Admitting: Neurology

## 2022-06-15 ENCOUNTER — Other Ambulatory Visit: Payer: Self-pay

## 2022-06-15 DIAGNOSIS — R1314 Dysphagia, pharyngoesophageal phase: Secondary | ICD-10-CM | POA: Diagnosis present

## 2022-06-15 DIAGNOSIS — Z79890 Hormone replacement therapy (postmenopausal): Secondary | ICD-10-CM | POA: Diagnosis not present

## 2022-06-15 DIAGNOSIS — G35 Multiple sclerosis: Secondary | ICD-10-CM | POA: Diagnosis present

## 2022-06-15 DIAGNOSIS — R296 Repeated falls: Secondary | ICD-10-CM | POA: Diagnosis present

## 2022-06-15 DIAGNOSIS — Z7983 Long term (current) use of bisphosphonates: Secondary | ICD-10-CM | POA: Diagnosis not present

## 2022-06-15 DIAGNOSIS — R159 Full incontinence of feces: Secondary | ICD-10-CM | POA: Diagnosis present

## 2022-06-15 DIAGNOSIS — M545 Low back pain, unspecified: Secondary | ICD-10-CM | POA: Diagnosis present

## 2022-06-15 DIAGNOSIS — R64 Cachexia: Secondary | ICD-10-CM | POA: Diagnosis present

## 2022-06-15 DIAGNOSIS — Z79899 Other long term (current) drug therapy: Secondary | ICD-10-CM | POA: Diagnosis not present

## 2022-06-15 DIAGNOSIS — M48061 Spinal stenosis, lumbar region without neurogenic claudication: Secondary | ICD-10-CM | POA: Diagnosis present

## 2022-06-15 DIAGNOSIS — G35D Multiple sclerosis, unspecified: Secondary | ICD-10-CM | POA: Diagnosis present

## 2022-06-15 DIAGNOSIS — R29898 Other symptoms and signs involving the musculoskeletal system: Secondary | ICD-10-CM | POA: Diagnosis present

## 2022-06-15 DIAGNOSIS — Z885 Allergy status to narcotic agent status: Secondary | ICD-10-CM | POA: Diagnosis not present

## 2022-06-15 DIAGNOSIS — G43909 Migraine, unspecified, not intractable, without status migrainosus: Secondary | ICD-10-CM | POA: Diagnosis present

## 2022-06-15 DIAGNOSIS — G8929 Other chronic pain: Secondary | ICD-10-CM | POA: Diagnosis present

## 2022-06-15 DIAGNOSIS — E785 Hyperlipidemia, unspecified: Secondary | ICD-10-CM | POA: Diagnosis present

## 2022-06-15 DIAGNOSIS — R5381 Other malaise: Secondary | ICD-10-CM | POA: Diagnosis not present

## 2022-06-15 DIAGNOSIS — R32 Unspecified urinary incontinence: Secondary | ICD-10-CM | POA: Diagnosis present

## 2022-06-15 DIAGNOSIS — F331 Major depressive disorder, recurrent, moderate: Secondary | ICD-10-CM | POA: Diagnosis present

## 2022-06-15 DIAGNOSIS — Z6822 Body mass index (BMI) 22.0-22.9, adult: Secondary | ICD-10-CM | POA: Diagnosis not present

## 2022-06-15 DIAGNOSIS — E039 Hypothyroidism, unspecified: Secondary | ICD-10-CM | POA: Diagnosis present

## 2022-06-15 DIAGNOSIS — M4854XA Collapsed vertebra, not elsewhere classified, thoracic region, initial encounter for fracture: Secondary | ICD-10-CM | POA: Diagnosis present

## 2022-06-15 DIAGNOSIS — E559 Vitamin D deficiency, unspecified: Secondary | ICD-10-CM | POA: Diagnosis present

## 2022-06-15 DIAGNOSIS — M810 Age-related osteoporosis without current pathological fracture: Secondary | ICD-10-CM | POA: Diagnosis present

## 2022-06-15 DIAGNOSIS — Z9151 Personal history of suicidal behavior: Secondary | ICD-10-CM | POA: Diagnosis not present

## 2022-06-15 DIAGNOSIS — M5136 Other intervertebral disc degeneration, lumbar region: Secondary | ICD-10-CM | POA: Diagnosis present

## 2022-06-15 LAB — BASIC METABOLIC PANEL
Anion gap: 11 (ref 5–15)
BUN: 18 mg/dL (ref 8–23)
CO2: 19 mmol/L — ABNORMAL LOW (ref 22–32)
Calcium: 8.5 mg/dL — ABNORMAL LOW (ref 8.9–10.3)
Chloride: 107 mmol/L (ref 98–111)
Creatinine, Ser: 0.75 mg/dL (ref 0.44–1.00)
GFR, Estimated: >60 mL/min (ref >60)
Glucose, Bld: 126 mg/dL — ABNORMAL HIGH (ref 70–99)
Potassium: 3.6 mmol/L (ref 3.5–5.1)
Sodium: 137 mmol/L (ref 135–145)

## 2022-06-15 LAB — MAGNESIUM: Magnesium: 1.9 mg/dL (ref 1.7–2.4)

## 2022-06-15 MED ORDER — SODIUM CHLORIDE 0.9 % IV SOLN
1000.0000 mg | Freq: Every day | INTRAVENOUS | Status: AC
  Administered 2022-06-15 – 2022-06-17 (×3): 1000 mg via INTRAVENOUS
  Filled 2022-06-15 (×3): qty 16

## 2022-06-15 MED ORDER — GADOBUTROL 1 MMOL/ML IV SOLN
5.0000 mL | Freq: Once | INTRAVENOUS | Status: AC | PRN
  Administered 2022-06-15: 5 mL via INTRAVENOUS

## 2022-06-15 NOTE — Consult Note (Signed)
[facility] Telemedicine Consult   Requesting Provider: Maryln Manuel Consult Participants: Patient Location of the provider: [place], [place] Location of the patient: [facility]  This consult was provided via telemedicine with 2-way video and audio communication. The patient/family was informed that care would be provided in this way and agreed to receive care in this manner.    Chief Complaint: Lower Extremity Weakness  HPI: 66 year old female with a longstanding history of multiple sclerosis since the early 90s who presents with lower extremity weakness.  She states that on Monday, she noticed some beginning she was with her ability to get up out of the wheelchair and transfer.  She states on Tuesday it was significantly worse, now she is not really able to transfer.  She had an MRI this morning which does show some areas of enhancement in the thoracic spinal cord,    Exam: Vitals:   06/15/22 1122 06/15/22 1205  BP:  132/78  Pulse: 85 84  Resp: 15 17  Temp:  98.9 F (37.2 C)  SpO2: 95% 94%    General: In bed, NAD She is oriented to month and year, but she initially states that she is in [facility] when asked where she is, but she does correct her self to [facility] when prompted  1A: Level of Consciousness - 0 1B: Ask Month and Age - 0 1C: 'Blink Eyes' & 'Squeeze Hands' - 0 2: Test Horizontal Extraocular Movements - 0 3: Test Visual Fields - --- unable to assess 4: Test Facial Palsy - 1 left facial weakness 5A: Test Left Arm Motor Drift - 0 5B: Test Right Arm Motor Drift - 0 6A: Test Left Leg Motor Drift -she is able to bend her knees against gravity bilaterally, but unable to lift her ankle off the bed 6B: Test Right Leg Motor Drift -as above 7: Test Limb Ataxia -unable to assess 8: Test Sensation -she reports no new numbness 9: Test Language/Aphasia-no evidence of aphasia 10: Test Dysarthria -zero 11: Test Extinction/Inattention  -unable to assess  Imaging Reviewed: MRI thoracic spine-patchy areas that appear enhancing  Labs reviewed in epic and pertinent values follow: Urinalysis is negative SARS is negative   Assessment: 66 year old female who is presenting with bilateral lower extremity weakness with patchy thoracic cord enhancement consistent with an acute multiple sclerosis flare.  Given the imaging findings, I do think it is reasonable to pursue a short course of IV Solu-Medrol, her most recent was in September.  Recommendations: 1) IV Solu-Medrol 1 g daily for 3 days 2) PT, OT, ST 3) follow-up with Dr. Everlena Cooper as outpatient 4) neurology will continue to be available as any questions arise.  Please call if needed.   Ritta Slot, MD [facility] [phone]  If 7pm- 7am, please page neurology on call as listed in AMION.

## 2022-06-15 NOTE — Telephone Encounter (Signed)
Pamela Roman wants to let dr.jaffe know she is currently in the hospital.

## 2022-06-15 NOTE — ED Notes (Signed)
Pt was given breakfast tray, Pt was not in the room at this time.

## 2022-06-15 NOTE — Evaluation (Signed)
Physical Therapy Evaluation Patient Details Name: Pamela Roman MRN: [id number] DOB: 07/01/1956 Today's Date: 06/15/2022  History of Present Illness  66 y.o. F admitted to [facility] on 12/10 due to bilateral LE weakness. Pt has progressive multiple sclerosis, with intermittent short episodes of weakness, however this time she reports it is worse and has lasted longer than normal. PMH significant for multiple sclerosis, migraines, bowel and bladder dysfunction, chronic neuropathic pain, pseudo exacerbation related to heat and overexertion, vitamin D deficiency, hypothyroidism, hyperlipidemia, multiple falls.   Clinical Impression  Patient demonstrates slow labored movement for sitting up at bedside, very unsteady on feet requiring Mod/max assist to stand pivot transfer to chair and unable to maintain standing balance or takes steps using RW.  Patient tolerated sitting up in chair after therapy - RN notified.  Patient will benefit from continued skilled physical therapy in hospital and recommended venue below to increase strength, balance, endurance for safe ADLs and gait.          Recommendations for follow up therapy are one component of a multi-disciplinary discharge planning process, led by the attending physician.  Recommendations may be updated based on patient status, additional functional criteria and insurance authorization.  Follow Up Recommendations Skilled nursing-short term rehab (<3 hours/day) Can patient physically be transported by private vehicle: No    Assistance Recommended at Discharge Set up Supervision/Assistance  Patient can return home with the following  A lot of help with bathing/dressing/bathroom;A lot of help with walking and/or transfers;Help with stairs or ramp for entrance;Assistance with cooking/housework    Equipment Recommendations None recommended by PT  Recommendations for Other Services       Functional Status Assessment Patient has had a recent decline in  their functional status and demonstrates the ability to make significant improvements in function in a reasonable and predictable amount of time.     Precautions / Restrictions Precautions Precautions: Fall Restrictions Weight Bearing Restrictions: No      Mobility  Bed Mobility Overal bed mobility: Needs Assistance Bed Mobility: Supine to Sit     Supine to sit: Max assist     General bed mobility comments: slow labored movement    Transfers Overall transfer level: Needs assistance Equipment used: Rolling walker (2 wheels), None, 1 person hand held assist Transfers: Sit to/from Stand, Bed to chair/wheelchair/BSC Sit to Stand: Max assist Stand pivot transfers: Mod assist, Max assist         General transfer comment: unable to maintain balance when attempting to use RW, required stand pivot with knees blocked    Ambulation/Gait                  Stairs            Wheelchair Mobility    Modified Rankin (Stroke Patients Only)       Balance Overall balance assessment: Needs assistance Sitting-balance support: Feet supported, No upper extremity supported Sitting balance-Leahy Scale: Poor Sitting balance - Comments: fair/poor seated at EOB   Standing balance support: During functional activity, No upper extremity supported Standing balance-Leahy Scale: Poor Standing balance comment: poor/zero using RW                             Pertinent Vitals/Pain Pain Assessment Pain Assessment: Faces Faces Pain Scale: Hurts a little bit Pain Location: Both legs Pain Descriptors / Indicators: Aching, Grimacing Pain Intervention(s): Limited activity within patient's tolerance, Monitored during session, Repositioned  Home Living Family/patient expects to be discharged to:: Private residence Living Arrangements: Other relatives Available Help at Discharge: Family;Available PRN/intermittently Type of Home: House Home Access: Ramped entrance        Home Layout: Two level;Able to live on main level with bedroom/bathroom Home Equipment: Rolling Walker (2 wheels);Shower seat - built in;Grab bars - toilet;Grab bars - tub/shower;Hand held shower head;Wheelchair - power      Prior Function Prior Level of Function : Needs assist       Physical Assist : Mobility (physical);ADLs (physical) Mobility (physical): Bed mobility;Transfers;Gait;Stairs   Mobility Comments: Uses power WC for all mobility, able to use RW for very short ambulation and transfers ADLs Comments: Reports independence with increased time.     Hand Dominance   Dominant Hand: Right    Extremity/Trunk Assessment   Upper Extremity Assessment Upper Extremity Assessment: Defer to OT evaluation    Lower Extremity Assessment Lower Extremity Assessment: Generalized weakness    Cervical / Trunk Assessment Cervical / Trunk Assessment: Kyphotic  Communication   Communication: No difficulties  Cognition Arousal/Alertness: Awake/alert Behavior During Therapy: WFL for tasks assessed/performed Overall Cognitive Status: Within Functional Limits for tasks assessed                                          General Comments General comments (skin integrity, edema, etc.): VSS on RA    Exercises     Assessment/Plan    PT Assessment Patient needs continued PT services  PT Problem List Decreased strength;Decreased activity tolerance;Decreased balance;Decreased mobility       PT Treatment Interventions DME instruction;Gait training;Functional mobility training;Therapeutic activities;Therapeutic exercise;Wheelchair mobility training;Patient/family education;Balance training    PT Goals (Current goals can be found in the Care Plan section)  Acute Rehab PT Goals Patient Stated Goal: return home after rehab PT Goal Formulation: With patient Time For Goal Achievement: 06/29/22 Potential to Achieve Goals: Good    Frequency Min 3X/week      Co-evaluation PT/OT/SLP Co-Evaluation/Treatment: Yes Reason for Co-Treatment: To address functional/ADL transfers;Complexity of the patient's impairments (multi-system involvement) PT goals addressed during session: Mobility/safety with mobility;Balance;Proper use of DME OT goals addressed during session: ADL's and self-care;Strengthening/ROM       AM-PAC PT "6 Clicks" Mobility  Outcome Measure Help needed turning from your back to your side while in a flat bed without using bedrails?: A Lot Help needed moving from lying on your back to sitting on the side of a flat bed without using bedrails?: A Lot Help needed moving to and from a bed to a chair (including a wheelchair)?: A Lot Help needed standing up from a chair using your arms (e.g., wheelchair or bedside chair)?: A Lot Help needed to walk in hospital room?: Total Help needed climbing 3-5 steps with a railing? : Total 6 Click Score: 10    End of Session   Activity Tolerance: Patient tolerated treatment well;Patient limited by fatigue Patient left: in chair;with call bell/phone within reach Nurse Communication: Mobility status PT Visit Diagnosis: Unsteadiness on feet (R26.81);Other abnormalities of gait and mobility (R26.89);Muscle weakness (generalized) (M62.81)    Time: 2694-8546 PT Time Calculation (min) (ACUTE ONLY): 20 min   Charges:   PT Evaluation $PT Eval Moderate Complexity: 1 Mod PT Treatments $Therapeutic Activity: 8-22 mins        1:49 PM, 06/15/22 Ocie Bob, MPT Physical Therapist with [facility] [phone]  [phone] office [phone] mobile phone

## 2022-06-15 NOTE — Progress Notes (Signed)
PROGRESS NOTE   Pamela Roman  [id number] DOB: [birth year] DOA: 06/14/2022 PCP: Lindell Spar, MD   Chief Complaint  Patient presents with   Extremity Weakness    Bilateral leg weakness   Level of care: Med-Surg  Brief Admission History:  66 y/o female with longstanding MS and followed by Dr. Tomi Likens with [facility] since relocating from [place] presented to ED today with progressive weakness in lower extremities.  She is noted to have progressive multiple sclerosis, migraines, bowel and bladder dysfunction, chronic neuropathic pain, pseudo exacerbation related to heat and overexertion, vitamin D deficiency, hypothyroidism, hyperlipidemia, multiple falls.  She has degeneration of the lumbar disks, chronic low back pain, osteoporosis, pharyngeal esophageal dysphagia, spinal stenosis and thoracic compression fracture.  She reports that her baseline is that she is able to ambulate with the use of her motorized wheelchair with the assistance of her sister whom she lives with and she manages most of her ADLs.  She reports that she has been treated with Betaseron; Avonex; Tysabri from around 2011 until September 2020 due to positive JC virus antibody.  She says she had an 11-month period of taking no medication after that.  Patient reports that she has been taking Mayzent for maintenance of MS since July 2021.  She reportedly had a severe exacerbation of her MS several months ago requiring a high-dose prednisone taper and it was associated with a suicide attempt that she believes was related to the MS flare.  Patient has known chronic debilitating depression of chronic illness and takes antidepressants.    After completing the prednisone taper for the MS flare she felt like she was back to baseline.  Unfortunately approximately 1 week ago she had an episode where she had severe lower extremity weakness but it only lasted 1 day.  When she woke up the next morning she felt back to her baseline.   She reported she felt well for several days and then yesterday she became very weak in her lower extremities again but this was more severe than the prior episode.  She went to bed but when she woke up her symptoms had not resolved like they had before.  She was not able to get to the bathroom because she was so severely weak and in her legs.  She lost strength in her ankles and lower legs.   She reports intermittent bowel and bladder incontinence but this is not new.  Her main symptom to this as she has not been strong enough to lift her body onto a seat or chair to use the bathroom.  She reports that her vision and her hearing continues to deteriorate.  She was evaluated in the ED by Jerene Pitch small who had a consultation with neurologist Dr. Quinn Axe who recommended that patient be admitted to [facility] for MRI brain, cervical and thoracic spine with and without contrast.     Assessment and Plan:  Progressive Multiple Sclerosis - possibly an acute MS flare - discussed with Dr. Quinn Axe who recommended transfer to [facility] - discussed with Dr. Leonel Ramsay 12/11, ok to keep patient at [facility] and will do teleneurology consult  - MRI brain, Cspine, Tspine with and without contrast for 12/11 at [facility] - neuro checks - neurology consultation appreciated - further recommendations to follow   Progressive bilateral LE weakness  - possibly related to MS flare - obtain PT evaluation  - MRI studies ordered as above  - neurology consultation    Physical deconditioning/Frequent Falls - PT  eval - fall precautions recommended   Major Depressive Disorder - pt reports suicide attempt with last MS flare - continue home antidepressants  - pt denies suicidal thoughts or plans and contracted for safety   Hypothyroidism  - resume home levothyroxine   Pharyngoesophageal dysphagia  - chronic - dysphagia 3 diet ordered - aspiration precautions  - elevate head of bed    Chronic back pain / DDD  - pain medication  ordered as needed    DVT prophylaxis: enoxaparin   Code Status: Full   Family Communication:  t/c to sister 12/11 Disposition Plan: TBD   Consults called: neurology     Consultants:  teleneurology Procedures:   Antimicrobials:    Subjective: Pt reports not back to baseline but she is feeling better today overall.    Objective: Vitals:   06/15/22 1047 06/15/22 1100 06/15/22 1122 06/15/22 1205  BP: (!) 110/90 125/85  132/78  Pulse: 95 89 85 84  Resp: 16 15 15 17   Temp:    98.9 F (37.2 C)  TempSrc:    Oral  SpO2: 98% 92% 95% 94%  Weight:      Height:       No intake or output data in the 24 hours ending 06/15/22 1315 Filed Weights   06/14/22 0905  Weight: 57.2 kg   Examination:  General exam: Appears calm and comfortable  Respiratory system: Clear to auscultation. Respiratory effort normal. Cardiovascular system: normal S1 & S2 heard. No JVD, murmurs, rubs, gallops or clicks. No pedal edema. Gastrointestinal system: Abdomen is nondistended, soft and nontender. No organomegaly or masses felt. Normal bowel sounds heard. Central nervous system: Alert and oriented. Sensation diminished LLE. Strength 3-4/5 in RLE/4/5 LLE.  Extremities: Symmetric 5 x 5 power. Skin: No rashes, lesions or ulcers. Psychiatry: Judgement and insight appear normal. Mood & affect appropriate.   Data Reviewed: I have personally reviewed following labs and imaging studies  CBC: Recent Labs  Lab 06/14/22 0948 06/14/22 1458  WBC 5.3 4.7  NEUTROABS 4.4  --   HGB 13.2 13.3  HCT 40.6 40.7  MCV 95.1 94.0  PLT 197 [id number]    Basic Metabolic Panel: Recent Labs  Lab 06/14/22 0948 06/14/22 1458 06/15/22 0516  NA 139  --  137  K 4.1  --  3.6  CL 109  --  107  CO2 23  --  19*  GLUCOSE 122*  --  126*  BUN 16  --  18  CREATININE 0.74 0.72 0.75  CALCIUM 8.9  --  8.5*  MG  --   --  1.9    CBG: Recent Labs  Lab 06/14/22 0900  GLUCAP 127*    No results found for this or any previous  visit (from the past 240 hour(s)).   Radiology Studies: MR Brain W and Wo Contrast  Result Date: 06/15/2022 CLINICAL DATA:  MS EXAM: MRI HEAD WITHOUT AND WITH CONTRAST MRI CERVICAL SPINE WITHOUT AND WITH CONTRAST MRI CERVICAL THORACIC WITHOUT AND CONTRAST CONTRAST:  53mL GADAVIST GADOBUTROL 1 MMOL/ML IV SOLN TECHNIQUE: Multiplanar, multiecho pulse sequences of the brain and surrounding structures, and cervical and thoracic spine were obtained without and with intravenous contrast. COMPARISON:  12/31/21 MRI Brain, Cervical Spine, and Thoracic SPine FINDINGS: MRI HEAD FINDINGS Brain: No acute infarction, hemorrhage, hydrocephalus, extra-axial collection. Redemonstrated are extensive periventricular and juxtacortical T2/FLAIR hyperintense lesions compatible with the patient's history of demyelinating disease. No new lesions are visualized. No evidence of diffusion restriction or contrast enhancement to suggest  active demyelination. There are several T1 black holes in the bilateral cerebral hemispheres, suggestive chronic volume loss . Vascular: Normal flow voids. Skull and upper cervical spine: Normal marrow signal. Sinuses/Orbits: Bilateral lens replacement. Mild bilateral ethmoid sinus mucosal thickening. Other: None MRI CERVICAL SPINE FINDINGS Limitations: Assessment is slightly limited due to the presence of motion artifact. Alignment: There is trace retrolisthesis of C2 on C3 and C3 on C4. Vertebrae: No fracture, evidence of discitis, or bone lesion. Cord: Redemonstrated are findings of extensive demyelinating lesions in the cervical spinal cord. No definite contrast-enhancing lesions are visualized to suggest active demyelination. T2 hyperintense lesions are seen at the: Craniocervical junction (series 3, image 7), at the C2-C3 level (series 3, image 7), C3-C4 level (series 4, image 10), C7-T1 level (series 4, image 25). There is chronic volume loss of the C7-T1 level, unchanged from prior exam. Posterior  Fossa, vertebral arteries, paraspinal tissues: Negative. Disc levels: No evidence of high-grade spinal canal stenosis. MRI THORACIC SPINE FINDINGS Limitations: Assessment for the presence of new lesions is limtied due to motion artifact. Alignment: There is grade 1 anterolisthesis of T9 on T10. Is grade 1 anterolisthesis of T12 on L1. Mild retrolisthesis of L2 on L3 and L3 on L4, unchanged from prior exam. Vertebrae: Compared to prior exam there is a new superior endplate compression deformity of the T10 level. Post kyphoplasty changes at T12 and L1. Unchanged pincer type compression deformity at the L1 level. Unchanged chronic compression deformities at T5 and T6. Unchanged chronic superior endplate compression deformity at T3. There is likely osseous hemangioma at T2. Cord: There are multiple demyelinating lesions throughout the thoracic spinal cord with near confluence T2 hyperintense signal in the upper thoracic spinal cord extending to the T11 level. Compared to prior exam a new more discrete focal lesion is seen at the T12-L1 level (series 22, image 34-36). There may be a focal region of contrast enhancement at the T5-T6 level (series 25, image 13). Paraspinal and other soft tissues: Negative. Disc levels: There is mild spinal canal narrowing at the L1 level secondary to retropulsion of the fracture fragment, unchanged from prior. IMPRESSION: 1. Findings consistent with the patient's history of multiple sclerosis. No new lesions are visualized in the brain or cervical spine. No evidence of active demyelination in the cervical spine. 2. Extensive demyelinating lesions throughout the thoracic spinal cord, with a new demyelinating lesion seen at the T12-L1 level. There may be a focal region of contrast enhancement at the T5-T6 level, which could suggest suggesting active demyelination. 3. New acute to subacute superior endplate compression deformity at the T10 level. Electronically Signed   By: Lorenza Cambridge M.D.    On: 06/15/2022 09:44   MR Cervical Spine W or Wo Contrast  Result Date: 06/15/2022 CLINICAL DATA:  MS EXAM: MRI HEAD WITHOUT AND WITH CONTRAST MRI CERVICAL SPINE WITHOUT AND WITH CONTRAST MRI CERVICAL THORACIC WITHOUT AND CONTRAST CONTRAST:  9mL GADAVIST GADOBUTROL 1 MMOL/ML IV SOLN TECHNIQUE: Multiplanar, multiecho pulse sequences of the brain and surrounding structures, and cervical and thoracic spine were obtained without and with intravenous contrast. COMPARISON:  12/31/21 MRI Brain, Cervical Spine, and Thoracic SPine FINDINGS: MRI HEAD FINDINGS Brain: No acute infarction, hemorrhage, hydrocephalus, extra-axial collection. Redemonstrated are extensive periventricular and juxtacortical T2/FLAIR hyperintense lesions compatible with the patient's history of demyelinating disease. No new lesions are visualized. No evidence of diffusion restriction or contrast enhancement to suggest active demyelination. There are several T1 black holes in the bilateral cerebral hemispheres, suggestive chronic  volume loss . Vascular: Normal flow voids. Skull and upper cervical spine: Normal marrow signal. Sinuses/Orbits: Bilateral lens replacement. Mild bilateral ethmoid sinus mucosal thickening. Other: None MRI CERVICAL SPINE FINDINGS Limitations: Assessment is slightly limited due to the presence of motion artifact. Alignment: There is trace retrolisthesis of C2 on C3 and C3 on C4. Vertebrae: No fracture, evidence of discitis, or bone lesion. Cord: Redemonstrated are findings of extensive demyelinating lesions in the cervical spinal cord. No definite contrast-enhancing lesions are visualized to suggest active demyelination. T2 hyperintense lesions are seen at the: Craniocervical junction (series 3, image 7), at the C2-C3 level (series 3, image 7), C3-C4 level (series 4, image 10), C7-T1 level (series 4, image 25). There is chronic volume loss of the C7-T1 level, unchanged from prior exam. Posterior Fossa, vertebral arteries,  paraspinal tissues: Negative. Disc levels: No evidence of high-grade spinal canal stenosis. MRI THORACIC SPINE FINDINGS Limitations: Assessment for the presence of new lesions is limtied due to motion artifact. Alignment: There is grade 1 anterolisthesis of T9 on T10. Is grade 1 anterolisthesis of T12 on L1. Mild retrolisthesis of L2 on L3 and L3 on L4, unchanged from prior exam. Vertebrae: Compared to prior exam there is a new superior endplate compression deformity of the T10 level. Post kyphoplasty changes at T12 and L1. Unchanged pincer type compression deformity at the L1 level. Unchanged chronic compression deformities at T5 and T6. Unchanged chronic superior endplate compression deformity at T3. There is likely osseous hemangioma at T2. Cord: There are multiple demyelinating lesions throughout the thoracic spinal cord with near confluence T2 hyperintense signal in the upper thoracic spinal cord extending to the T11 level. Compared to prior exam a new more discrete focal lesion is seen at the T12-L1 level (series 22, image 34-36). There may be a focal region of contrast enhancement at the T5-T6 level (series 25, image 13). Paraspinal and other soft tissues: Negative. Disc levels: There is mild spinal canal narrowing at the L1 level secondary to retropulsion of the fracture fragment, unchanged from prior. IMPRESSION: 1. Findings consistent with the patient's history of multiple sclerosis. No new lesions are visualized in the brain or cervical spine. No evidence of active demyelination in the cervical spine. 2. Extensive demyelinating lesions throughout the thoracic spinal cord, with a new demyelinating lesion seen at the T12-L1 level. There may be a focal region of contrast enhancement at the T5-T6 level, which could suggest suggesting active demyelination. 3. New acute to subacute superior endplate compression deformity at the T10 level. Electronically Signed   By: Marin Roberts M.D.   On: 06/15/2022 09:44    MR THORACIC SPINE W WO CONTRAST  Result Date: 06/15/2022 CLINICAL DATA:  MS EXAM: MRI HEAD WITHOUT AND WITH CONTRAST MRI CERVICAL SPINE WITHOUT AND WITH CONTRAST MRI CERVICAL THORACIC WITHOUT AND CONTRAST CONTRAST:  62mL GADAVIST GADOBUTROL 1 MMOL/ML IV SOLN TECHNIQUE: Multiplanar, multiecho pulse sequences of the brain and surrounding structures, and cervical and thoracic spine were obtained without and with intravenous contrast. COMPARISON:  12/31/21 MRI Brain, Cervical Spine, and Thoracic SPine FINDINGS: MRI HEAD FINDINGS Brain: No acute infarction, hemorrhage, hydrocephalus, extra-axial collection. Redemonstrated are extensive periventricular and juxtacortical T2/FLAIR hyperintense lesions compatible with the patient's history of demyelinating disease. No new lesions are visualized. No evidence of diffusion restriction or contrast enhancement to suggest active demyelination. There are several T1 black holes in the bilateral cerebral hemispheres, suggestive chronic volume loss . Vascular: Normal flow voids. Skull and upper cervical spine: Normal marrow signal. Sinuses/Orbits:  Bilateral lens replacement. Mild bilateral ethmoid sinus mucosal thickening. Other: None MRI CERVICAL SPINE FINDINGS Limitations: Assessment is slightly limited due to the presence of motion artifact. Alignment: There is trace retrolisthesis of C2 on C3 and C3 on C4. Vertebrae: No fracture, evidence of discitis, or bone lesion. Cord: Redemonstrated are findings of extensive demyelinating lesions in the cervical spinal cord. No definite contrast-enhancing lesions are visualized to suggest active demyelination. T2 hyperintense lesions are seen at the: Craniocervical junction (series 3, image 7), at the C2-C3 level (series 3, image 7), C3-C4 level (series 4, image 10), C7-T1 level (series 4, image 25). There is chronic volume loss of the C7-T1 level, unchanged from prior exam. Posterior Fossa, vertebral arteries, paraspinal tissues:  Negative. Disc levels: No evidence of high-grade spinal canal stenosis. MRI THORACIC SPINE FINDINGS Limitations: Assessment for the presence of new lesions is limtied due to motion artifact. Alignment: There is grade 1 anterolisthesis of T9 on T10. Is grade 1 anterolisthesis of T12 on L1. Mild retrolisthesis of L2 on L3 and L3 on L4, unchanged from prior exam. Vertebrae: Compared to prior exam there is a new superior endplate compression deformity of the T10 level. Post kyphoplasty changes at T12 and L1. Unchanged pincer type compression deformity at the L1 level. Unchanged chronic compression deformities at T5 and T6. Unchanged chronic superior endplate compression deformity at T3. There is likely osseous hemangioma at T2. Cord: There are multiple demyelinating lesions throughout the thoracic spinal cord with near confluence T2 hyperintense signal in the upper thoracic spinal cord extending to the T11 level. Compared to prior exam a new more discrete focal lesion is seen at the T12-L1 level (series 22, image 34-36). There may be a focal region of contrast enhancement at the T5-T6 level (series 25, image 13). Paraspinal and other soft tissues: Negative. Disc levels: There is mild spinal canal narrowing at the L1 level secondary to retropulsion of the fracture fragment, unchanged from prior. IMPRESSION: 1. Findings consistent with the patient's history of multiple sclerosis. No new lesions are visualized in the brain or cervical spine. No evidence of active demyelination in the cervical spine. 2. Extensive demyelinating lesions throughout the thoracic spinal cord, with a new demyelinating lesion seen at the T12-L1 level. There may be a focal region of contrast enhancement at the T5-T6 level, which could suggest suggesting active demyelination. 3. New acute to subacute superior endplate compression deformity at the T10 level. Electronically Signed   By: Marin Roberts M.D.   On: 06/15/2022 09:44   DG Hip Unilat W or  Wo Pelvis 2-3 Views Right  Result Date: 06/14/2022 CLINICAL DATA:  Bilateral leg weakness. EXAM: DG HIP (WITH OR WITHOUT PELVIS) 2-3V RIGHT COMPARISON:  None Available. FINDINGS: There is no evidence of hip fracture or dislocation. Degenerative disc disease noted within the imaged portions of the lumbar spine. Moderate retained stool identified within the rectum. Calcified uterine fibroid noted in the pelvis. IMPRESSION: 1. No acute findings. 2. Calcified uterine fibroid. 3. Moderate retained stool within the rectum. Electronically Signed   By: Kerby Moors M.D.   On: 06/14/2022 10:20   CT Lumbar Spine Wo Contrast  Result Date: 06/14/2022 CLINICAL DATA:  Colles weakness. New incontinence. Patient has a history of a mass. EXAM: CT LUMBAR SPINE WITHOUT CONTRAST TECHNIQUE: Multidetector CT imaging of the lumbar spine was performed without intravenous contrast administration. Multiplanar CT image reconstructions were also generated. RADIATION DOSE REDUCTION: This exam was performed according to the departmental dose-optimization program which includes automated  exposure control, adjustment of the mA and/or kV according to patient size and/or use of iterative reconstruction technique. COMPARISON:  Thoracic spine MRI dated 12/31/2021. FINDINGS: Segmentation: 5 lumbar type vertebrae. Alignment: Slight retrolisthesis of L2 on L3. Mild levoscoliosis, apex at L2-L3. Vertebrae: Chronic fractures of T11, T12, L1 and L2, T12 and L1 previously treated with vertebroplasty. These findings are stable from the prior thoracic MRI. No other fractures.  No bone lesions. Paraspinal and other soft tissues: No soft tissue mass. No spinal canal mass or fluid collection. Skin aortic atherosclerotic calcifications. Disc levels: T12-L1: Posterior aspect of the fractured L1 vertebra mildly indents the ventral thecal sac, without significant spinal canal narrowing. Neural foramina are well preserved. No disc bulging or disc herniation.  L1-L2: No disc bulging. No evidence of a disc herniation. Central spinal canal and neural foramina are well preserved. L2-L3: Lower endplate of the fractured L2 vertebra mildly indents the ventral thecal sac without significant spinal canal narrowing. Moderate bilateral neural foraminal narrowing due to a combination of the prominent lower endplate and facet degenerative changes and the mild degenerative retrolisthesis. No convincing disc herniation. L3-L4: Minor disc bulging. No disc herniation. No significant stenosis. L4-L5: Mild diffuse disc bulging with endplate spurring. Mild facet degenerative change. Mild narrowing of the central spinal canal. Lateral recesses relatively well preserved. Moderate bilateral neural foraminal narrowing. No disc herniation. L5-S1: Minor disc bulging. No disc herniation. No significant stenosis. IMPRESSION: 1. No acute fracture or acute finding. 2. Chronic fractures of T11, T12, L1 and L2. 3. Degenerative changes as detailed. No disc herniation. Mild central spinal canal narrowing at L4-L5. No findings, however, to account for new incontinence. Electronically Signed   By: Lajean Manes M.D.   On: 06/14/2022 10:00   CT Head Wo Contrast  Result Date: 06/14/2022 CLINICAL DATA:  History of multiple sclerosis. Worsening bilateral lower extremity weakness. EXAM: CT HEAD WITHOUT CONTRAST TECHNIQUE: Contiguous axial images were obtained from the base of the skull through the vertex without intravenous contrast. RADIATION DOSE REDUCTION: This exam was performed according to the departmental dose-optimization program which includes automated exposure control, adjustment of the mA and/or kV according to patient size and/or use of iterative reconstruction technique. COMPARISON:  Brain MRI, 12/31/2021. FINDINGS: Brain: No evidence of acute infarction, hemorrhage, hydrocephalus, extra-axial collection or mass lesion/mass effect. Bilateral patchy areas of periventricular white matter  hypoattenuation consistent with MS plaques and without significant change from the prior brain MRI. Vascular: No hyperdense vessel or unexpected calcification. Skull: Normal. Negative for fracture or focal lesion. Sinuses/Orbits: Globes and orbits are unremarkable. Mild scattered ethmoid sinus mucosal thickening. Other: None. IMPRESSION: 1. No acute intracranial abnormalities. 2. White matter changes consistent with the history of MS, and without significant change from the brain MRI dated 12/31/2021. Electronically Signed   By: Lajean Manes M.D.   On: 06/14/2022 09:53    Scheduled Meds:  enoxaparin (LOVENOX) injection  40 mg Subcutaneous Q24H   levothyroxine  50 mcg Oral QAC breakfast   sertraline  25 mg Oral q AM   simvastatin  20 mg Oral q1800   Siponimod Fumarate  1 mg Oral q morning   zonisamide  100 mg Oral q AM   Continuous Infusions:   LOS: 0 days   Time spent: 35 mins  Ellarie Picking Wynetta Emery, MD How to contact the [facility] Attending or Consulting provider [place] or covering provider during after hours [place], for this patient?  Check the care team in [facility] and look for  a) attending/consulting [place] provider listed and b) the [place] team listed Log into [email or web address] and use [place] universal password to access. If you do not have the password, please contact the hospital operator. Locate the [facility] provider you are looking for under [facility] and page to a number that you can be directly reached. If you still have difficulty reaching the provider, please page the [facility] (Director on Call) for the Hospitalists listed on amion for assistance.  06/15/2022, 1:15 PM

## 2022-06-15 NOTE — ED Notes (Signed)
Attempted report, bed is not assigned to nurse yet.

## 2022-06-15 NOTE — Telephone Encounter (Signed)
noted 

## 2022-06-15 NOTE — ED Notes (Signed)
Pt requested and provided pain medication prior to MRI.

## 2022-06-15 NOTE — Evaluation (Signed)
Occupational Therapy Evaluation Patient Details Name: Pamela Roman MRN: [id number] DOB: 15-Apr-1956 Today's Date: 06/15/2022   History of Present Illness 66 y.o. F admitted to [facility] on 12/10 due to bilateral LE weakness. Pt has progressive multiple sclerosis, with intermittent short episodes of weakness, however this time she reports it is worse and has lasted longer than normal. PMH significant for multiple sclerosis, migraines, bowel and bladder dysfunction, chronic neuropathic pain, pseudo exacerbation related to heat and overexertion, vitamin D deficiency, hypothyroidism, hyperlipidemia, multiple falls.   Clinical Impression   Pt admitted for concerns listed above. PTA pt reported that she was able to complete all ADL's with no assist, except for getting into and out of bed to her power WC. At this time, she is unable to stand without max A+2, as well as to complete scoot transfers. Due to poor sitting and standing balance and weakness, she is requiring maximal assist +2 for most ADL's at this time. Recommending SNF level therapies to maximize independence and safety before returning home.       Recommendations for follow up therapy are one component of a multi-disciplinary discharge planning process, led by the attending physician.  Recommendations may be updated based on patient status, additional functional criteria and insurance authorization.   Follow Up Recommendations  Skilled nursing-short term rehab (<3 hours/day)     Assistance Recommended at Discharge Frequent or constant Supervision/Assistance  Patient can return home with the following Two people to help with walking and/or transfers;Two people to help with bathing/dressing/bathroom;Assistance with cooking/housework;Help with stairs or ramp for entrance    Functional Status Assessment  Patient has had a recent decline in their functional status and/or demonstrates limited ability to make significant improvements in function in  a reasonable and predictable amount of time  Equipment Recommendations  None recommended by OT    Recommendations for Other Services       Precautions / Restrictions Precautions Precautions: Fall Restrictions Weight Bearing Restrictions: No      Mobility Bed Mobility Overal bed mobility: Needs Assistance Bed Mobility: Supine to Sit     Supine to sit: Max assist     General bed mobility comments: Max A to move BLE to the EOB and assist with BUE to pull up to sitting and scoot to EOB    Transfers Overall transfer level: Needs assistance Equipment used: Rolling walker (2 wheels) Transfers: Sit to/from Stand, Bed to chair/wheelchair/BSC Sit to Stand: Max assist          Lateral/Scoot Transfers: Max assist, +2 safety/equipment, From elevated surface General transfer comment: Unable to fully tolerate 100% weight bearing on BLE      Balance Overall balance assessment: Needs assistance Sitting-balance support: Bilateral upper extremity supported, Feet unsupported Sitting balance-Leahy Scale: Poor Sitting balance - Comments: Unable to tolerate weight shifts Postural control: Posterior lean, Left lateral lean Standing balance support: Bilateral upper extremity supported, Reliant on assistive device for balance Standing balance-Leahy Scale: Poor Standing balance comment: Poor/zero with RW and max assist                           ADL either performed or assessed with clinical judgement   ADL Overall ADL's : Needs assistance/impaired Eating/Feeding: Set up;Sitting   Grooming: Minimal assistance;Sitting   Upper Body Bathing: Moderate assistance;Sitting   Lower Body Bathing: Maximal assistance;+2 for physical assistance;+2 for safety/equipment;Sit to/from stand;Sitting/lateral leans   Upper Body Dressing : Moderate assistance;Sitting   Lower Body Dressing:  Maximal assistance;+2 for physical assistance;+2 for safety/equipment;Sit to/from stand;Sitting/lateral  leans   Toilet Transfer: Maximal assistance;+2 for physical assistance;+2 for safety/equipment;Stand-pivot   Toileting- Clothing Manipulation and Hygiene: Maximal assistance;+2 for safety/equipment;+2 for physical assistance;Sitting/lateral lean;Sit to/from stand       Functional mobility during ADLs: Maximal assistance;+2 for safety/equipment;+2 for physical assistance;Rolling walker (2 wheels) General ADL Comments: Limited due to weakness and balance     Vision Baseline Vision/History: 0 No visual deficits Ability to See in Adequate Light: 0 Adequate Patient Visual Report: No change from baseline Vision Assessment?: No apparent visual deficits     Perception Perception Perception Tested?: No   Praxis Praxis Praxis tested?: Not tested    Pertinent Vitals/Pain Pain Assessment Pain Assessment: Faces Faces Pain Scale: Hurts a little bit Pain Location: Both legs Pain Descriptors / Indicators: Aching, Grimacing Pain Intervention(s): Limited activity within patient's tolerance, Monitored during session, Repositioned     Hand Dominance Right   Extremity/Trunk Assessment Upper Extremity Assessment Upper Extremity Assessment: Generalized weakness   Lower Extremity Assessment Lower Extremity Assessment: Generalized weakness   Cervical / Trunk Assessment Cervical / Trunk Assessment: Kyphotic   Communication Communication Communication: No difficulties   Cognition Arousal/Alertness: Awake/alert Behavior During Therapy: WFL for tasks assessed/performed Overall Cognitive Status: Within Functional Limits for tasks assessed                                       General Comments  VSS on RA    Exercises     Shoulder Instructions      Home Living Family/patient expects to be discharged to:: Private residence Living Arrangements: Other relatives (sister) Available Help at Discharge: Family;Available PRN/intermittently Type of Home: House Home Access:  Ramped entrance     Home Layout: Two level;Able to live on main level with bedroom/bathroom     Bathroom Shower/Tub: Producer, television/film/video: Standard Bathroom Accessibility: Yes How Accessible: Accessible via wheelchair Home Equipment: Rolling Walker (2 wheels);Shower seat - built in;Grab bars - toilet;Grab bars - tub/shower;Hand held shower head;Wheelchair - power          Prior Functioning/Environment Prior Level of Function : Needs assist             Mobility Comments: Uses power WC for all mobility, able to use RW for very short ambulation and transfers ADLs Comments: Reports independence with increased time.        OT Problem List: Decreased strength;Decreased activity tolerance;Impaired balance (sitting and/or standing);Decreased coordination;Decreased safety awareness;Impaired UE functional use      OT Treatment/Interventions: Self-care/ADL training;Therapeutic exercise;DME and/or AE instruction;Energy conservation;Neuromuscular education;Therapeutic activities;Balance training;Patient/family education    OT Goals(Current goals can be found in the care plan section) Acute Rehab OT Goals Patient Stated Goal: To get back to prior level of independence OT Goal Formulation: With patient Time For Goal Achievement: 06/29/22 Potential to Achieve Goals: Fair ADL Goals Pt Will Perform Grooming: with modified independence;sitting Pt Will Perform Upper Body Bathing: with modified independence;sitting Pt Will Perform Lower Body Bathing: with mod assist;with adaptive equipment;sitting/lateral leans;sit to/from stand Pt Will Perform Upper Body Dressing: with modified independence;sitting Pt Will Perform Lower Body Dressing: with mod assist;with adaptive equipment;sitting/lateral leans;sit to/from stand Pt Will Transfer to Toilet: with mod assist;stand pivot transfer  OT Frequency: Min 1X/week    Co-evaluation PT/OT/SLP Co-Evaluation/Treatment: Yes Reason for  Co-Treatment: Complexity of the patient's impairments (multi-system involvement);To address functional/ADL  transfers;For patient/therapist safety   OT goals addressed during session: ADL's and self-care;Strengthening/ROM      AM-PAC OT "6 Clicks" Daily Activity     Outcome Measure Help from another person eating meals?: A Little Help from another person taking care of personal grooming?: A Little Help from another person toileting, which includes using toliet, bedpan, or urinal?: A Lot Help from another person bathing (including washing, rinsing, drying)?: A Lot Help from another person to put on and taking off regular upper body clothing?: A Lot Help from another person to put on and taking off regular lower body clothing?: A Lot 6 Click Score: 14   End of Session Equipment Utilized During Treatment: Gait belt;Rolling walker (2 wheels) Nurse Communication: Mobility status  Activity Tolerance: Patient tolerated treatment well Patient left: in chair;with call bell/phone within reach;with chair alarm set  OT Visit Diagnosis: Unsteadiness on feet (R26.81);Other abnormalities of gait and mobility (R26.89);Muscle weakness (generalized) (M62.81)                Time: 5625-6389 OT Time Calculation (min): 19 min Charges:  OT General Charges $OT Visit: 1 Visit OT Evaluation $OT Eval Moderate Complexity: 1 Mod  Sevag Shearn Bing Plume, OTR/L [facility] Acute Rehab  Devonna Oboyle Elane Bing Plume 06/15/2022, 11:47 AM

## 2022-06-15 NOTE — Plan of Care (Signed)
  Problem: Acute Rehab PT Goals(only PT should resolve) Goal: Pt Will Go Supine/Side To Sit Outcome: Progressing Flowsheets (Taken 06/15/2022 1350) Pt will go Supine/Side to Sit:  with minimal assist  with moderate assist Goal: Patient Will Transfer Sit To/From Stand Outcome: Progressing Flowsheets (Taken 06/15/2022 1350) Patient will transfer sit to/from stand:  with minimal assist  with moderate assist Goal: Pt Will Transfer Bed To Chair/Chair To Bed Outcome: Progressing Flowsheets (Taken 06/15/2022 1350) Pt will Transfer Bed to Chair/Chair to Bed:  with min assist  with mod assist Goal: Pt Will Ambulate Outcome: Progressing Flowsheets (Taken 06/15/2022 1350) Pt will Ambulate:  10 feet  with moderate assist  with rolling walker   1:51 PM, 06/15/22 Ocie Bob, MPT Physical Therapist with [facility] [phone] office [phone] mobile phone

## 2022-06-15 NOTE — ED Notes (Signed)
Pt to MRI

## 2022-06-16 NOTE — H&P (Signed)
Transition of Care (TOC) -30 day Note       Patient Details  Name: Pamela Roman Date of Birth: [birth year]   Transition of Care [facility] [place] CM/SW Contact  Name: Pamela Roman Phone Number: [phone] Date: 06/16/2022 Time: 1636     To Whom it May Concern:   Please be advised that the above patient will require a short-term nursing home stay, anticipated 30 days or less rehabilitation and strengthening. The plan is for return home.

## 2022-06-16 NOTE — Progress Notes (Signed)
PROGRESS NOTE   Pamela Roman  YPP:[id number] DOB: 1955/12/28 DOA: 06/14/2022 PCP: Anabel Halon, MD   Chief Complaint  Patient presents with   Extremity Weakness    Bilateral leg weakness   Level of care: Med-Surg  Brief Admission History:  66 y/o female with longstanding MS and followed by Dr. Everlena Cooper with [facility] since relocating from [place] presented to ED today with progressive weakness in lower extremities.  She is noted to have progressive multiple sclerosis, migraines, bowel and bladder dysfunction, chronic neuropathic pain, pseudo exacerbation related to heat and overexertion, vitamin D deficiency, hypothyroidism, hyperlipidemia, multiple falls.  She has degeneration of the lumbar disks, chronic low back pain, osteoporosis, pharyngeal esophageal dysphagia, spinal stenosis and thoracic compression fracture.  She reports that her baseline is that she is able to ambulate with the use of her motorized wheelchair with the assistance of her sister whom she lives with and she manages most of her ADLs.  She reports that she has been treated with Betaseron; Avonex; Tysabri from around 2011 until September 2020 due to positive JC virus antibody.  She says she had an 5-month period of taking no medication after that.  Patient reports that she has been taking Mayzent for maintenance of MS since July 2021.  She reportedly had a severe exacerbation of her MS several months ago requiring a high-dose prednisone taper and it was associated with a suicide attempt that she believes was related to the MS flare.  Patient has known chronic debilitating depression of chronic illness and takes antidepressants.    After completing the prednisone taper for the MS flare she felt like she was back to baseline.  Unfortunately approximately 1 week ago she had an episode where she had severe lower extremity weakness but it only lasted 1 day.  When she woke up the next morning she felt back to her baseline.   She reported she felt well for several days and then yesterday she became very weak in her lower extremities again but this was more severe than the prior episode.  She went to bed but when she woke up her symptoms had not resolved like they had before.  She was not able to get to the bathroom because she was so severely weak and in her legs.  She lost strength in her ankles and lower legs.   She reports intermittent bowel and bladder incontinence but this is not new.  Her main symptom to this as she has not been strong enough to lift her body onto a seat or chair to use the bathroom.  She reports that her vision and her hearing continues to deteriorate.  She was evaluated in the ED by Nehemiah Settle small who had a consultation with neurologist Dr. Selina Cooley who recommended that patient be admitted to [facility] for MRI brain, cervical and thoracic spine with and without contrast.     Assessment and Plan:  Progressive Multiple Sclerosis - acute MS flare confirmed by imaging  - discussed with Dr. Amada Jupiter 12/11, ok to keep patient at [facility] and will do teleneurology consult  - MRI brain, Cspine, Tspine with and without contrast for 12/11 at [facility] with patchy areas of enhancement  - neuro started on IV solumedrol 1 gm daily x 3 doses  - neurology consultation appreciated   Progressive bilateral LE weakness  - related to MS flare - obtain PT evaluation  - MRI studies ordered as above  - hopefully symptoms will improve after high dose steroid  treatments   Physical deconditioning/Frequent Falls - PT eval recommending SNF - fall precautions recommended   Major Depressive Disorder - pt reports suicide attempt with last MS flare - continue home antidepressants  - pt denies suicidal thoughts or plans and contracted for safety   Hypothyroidism  - resume home levothyroxine   Pharyngoesophageal dysphagia  - chronic - dysphagia 3 diet ordered - aspiration precautions  - elevate head of bed    Chronic  back pain / DDD  - pain medication ordered as needed    DVT prophylaxis: enoxaparin   Code Status: Full   Family Communication:  t/c to sister 12/11 Disposition Plan: anticipate DC after final dose of IV solumedrol    Consults called: neurology    Consultants:  Teleneurology Dr. Amada Jupiter  Procedures:   Antimicrobials:    Subjective: Pt reports not back to baseline but tolerating high dose steroids, no suicidal ideations today   Objective: Vitals:   06/15/22 2017 06/16/22 0035 06/16/22 0307 06/16/22 0513  BP: (!) 111/59 (!) 144/78 116/74 119/68  Pulse: 65 (!) 58  (!) 57  Resp: Temp: 97.9 F (36.6 C) 98.2 F (36.8 C)  98.8 F (37.1 C)  TempSrc: Oral     SpO2: 94% 97%  99%  Weight:      Height:        Intake/Output Summary (Last 24 hours) at 06/16/2022 0930 Last data filed at 06/16/2022 0500 Gross per 24 hour  Intake 220 ml  Output 400 ml  Net -180 ml   Filed Weights   06/14/22 0905  Weight: 57.2 kg   Examination:  General exam: Appears calm and comfortable  Respiratory system: Clear to auscultation. Respiratory effort normal. Cardiovascular system: normal S1 & S2 heard. No JVD, murmurs, rubs, gallops or clicks. No pedal edema. Gastrointestinal system: Abdomen is nondistended, soft and nontender. No organomegaly or masses felt. Normal bowel sounds heard. Central nervous system: Alert and oriented. Sensation diminished LLE. Strength 3-4/5 in RLE/4/5 LLE.  Extremities: Symmetric 5 x 5 power. Skin: No rashes, lesions or ulcers. Psychiatry: Judgement and insight appear normal. Mood & affect appropriate.   Data Reviewed: I have personally reviewed following labs and imaging studies  CBC: Recent Labs  Lab 06/14/22 0948 06/14/22 1458  WBC 5.3 4.7  NEUTROABS 4.4  --   HGB 13.2 13.3  HCT 40.6 40.7  MCV 95.1 94.0  PLT 197 200    Basic Metabolic Panel: Recent Labs  Lab 06/14/22 0948 06/14/22 1458 06/15/22 0516  NA 139  --  137  K 4.1  --   3.6  CL 109  --  107  CO2 23  --  19*  GLUCOSE 122*  --  126*  BUN 16  --  18  CREATININE 0.74 0.72 0.75  CALCIUM 8.9  --  8.5*  MG  --   --  1.9    CBG: Recent Labs  Lab 06/14/22 0900  GLUCAP 127*    No results found for this or any previous visit (from the past 240 hour(s)).   Radiology Studies: MR Brain W and Wo Contrast  Result Date: 06/15/2022 CLINICAL DATA:  MS EXAM: MRI HEAD WITHOUT AND WITH CONTRAST MRI CERVICAL SPINE WITHOUT AND WITH CONTRAST MRI CERVICAL THORACIC WITHOUT AND CONTRAST CONTRAST:  5mL GADAVIST GADOBUTROL 1 MMOL/ML IV SOLN TECHNIQUE: Multiplanar, multiecho pulse sequences of the brain and surrounding structures, and cervical and thoracic spine were obtained without and with intravenous contrast. COMPARISON:  12/31/21  MRI Brain, Cervical Spine, and Thoracic SPine FINDINGS: MRI HEAD FINDINGS Brain: No acute infarction, hemorrhage, hydrocephalus, extra-axial collection. Redemonstrated are extensive periventricular and juxtacortical T2/FLAIR hyperintense lesions compatible with the patient's history of demyelinating disease. No new lesions are visualized. No evidence of diffusion restriction or contrast enhancement to suggest active demyelination. There are several T1 black holes in the bilateral cerebral hemispheres, suggestive chronic volume loss . Vascular: Normal flow voids. Skull and upper cervical spine: Normal marrow signal. Sinuses/Orbits: Bilateral lens replacement. Mild bilateral ethmoid sinus mucosal thickening. Other: None MRI CERVICAL SPINE FINDINGS Limitations: Assessment is slightly limited due to the presence of motion artifact. Alignment: There is trace retrolisthesis of C2 on C3 and C3 on C4. Vertebrae: No fracture, evidence of discitis, or bone lesion. Cord: Redemonstrated are findings of extensive demyelinating lesions in the cervical spinal cord. No definite contrast-enhancing lesions are visualized to suggest active demyelination. T2 hyperintense lesions  are seen at the: Craniocervical junction (series 3, image 7), at the C2-C3 level (series 3, image 7), C3-C4 level (series 4, image 10), C7-T1 level (series 4, image 25). There is chronic volume loss of the C7-T1 level, unchanged from prior exam. Posterior Fossa, vertebral arteries, paraspinal tissues: Negative. Disc levels: No evidence of high-grade spinal canal stenosis. MRI THORACIC SPINE FINDINGS Limitations: Assessment for the presence of new lesions is limtied due to motion artifact. Alignment: There is grade 1 anterolisthesis of T9 on T10. Is grade 1 anterolisthesis of T12 on L1. Mild retrolisthesis of L2 on L3 and L3 on L4, unchanged from prior exam. Vertebrae: Compared to prior exam there is a new superior endplate compression deformity of the T10 level. Post kyphoplasty changes at T12 and L1. Unchanged pincer type compression deformity at the L1 level. Unchanged chronic compression deformities at T5 and T6. Unchanged chronic superior endplate compression deformity at T3. There is likely osseous hemangioma at T2. Cord: There are multiple demyelinating lesions throughout the thoracic spinal cord with near confluence T2 hyperintense signal in the upper thoracic spinal cord extending to the T11 level. Compared to prior exam a new more discrete focal lesion is seen at the T12-L1 level (series 22, image 34-36). There may be a focal region of contrast enhancement at the T5-T6 level (series 25, image 13). Paraspinal and other soft tissues: Negative. Disc levels: There is mild spinal canal narrowing at the L1 level secondary to retropulsion of the fracture fragment, unchanged from prior. IMPRESSION: 1. Findings consistent with the patient's history of multiple sclerosis. No new lesions are visualized in the brain or cervical spine. No evidence of active demyelination in the cervical spine. 2. Extensive demyelinating lesions throughout the thoracic spinal cord, with a new demyelinating lesion seen at the T12-L1  level. There may be a focal region of contrast enhancement at the T5-T6 level, which could suggest suggesting active demyelination. 3. New acute to subacute superior endplate compression deformity at the T10 level. Electronically Signed   By: Lorenza Cambridge M.D.   On: 06/15/2022 09:44   MR Cervical Spine W or Wo Contrast  Result Date: 06/15/2022 CLINICAL DATA:  MS EXAM: MRI HEAD WITHOUT AND WITH CONTRAST MRI CERVICAL SPINE WITHOUT AND WITH CONTRAST MRI CERVICAL THORACIC WITHOUT AND CONTRAST CONTRAST:  61mL GADAVIST GADOBUTROL 1 MMOL/ML IV SOLN TECHNIQUE: Multiplanar, multiecho pulse sequences of the brain and surrounding structures, and cervical and thoracic spine were obtained without and with intravenous contrast. COMPARISON:  12/31/21 MRI Brain, Cervical Spine, and Thoracic SPine FINDINGS: MRI HEAD FINDINGS Brain: No acute infarction,  hemorrhage, hydrocephalus, extra-axial collection. Redemonstrated are extensive periventricular and juxtacortical T2/FLAIR hyperintense lesions compatible with the patient's history of demyelinating disease. No new lesions are visualized. No evidence of diffusion restriction or contrast enhancement to suggest active demyelination. There are several T1 black holes in the bilateral cerebral hemispheres, suggestive chronic volume loss . Vascular: Normal flow voids. Skull and upper cervical spine: Normal marrow signal. Sinuses/Orbits: Bilateral lens replacement. Mild bilateral ethmoid sinus mucosal thickening. Other: None MRI CERVICAL SPINE FINDINGS Limitations: Assessment is slightly limited due to the presence of motion artifact. Alignment: There is trace retrolisthesis of C2 on C3 and C3 on C4. Vertebrae: No fracture, evidence of discitis, or bone lesion. Cord: Redemonstrated are findings of extensive demyelinating lesions in the cervical spinal cord. No definite contrast-enhancing lesions are visualized to suggest active demyelination. T2 hyperintense lesions are seen at the:  Craniocervical junction (series 3, image 7), at the C2-C3 level (series 3, image 7), C3-C4 level (series 4, image 10), C7-T1 level (series 4, image 25). There is chronic volume loss of the C7-T1 level, unchanged from prior exam. Posterior Fossa, vertebral arteries, paraspinal tissues: Negative. Disc levels: No evidence of high-grade spinal canal stenosis. MRI THORACIC SPINE FINDINGS Limitations: Assessment for the presence of new lesions is limtied due to motion artifact. Alignment: There is grade 1 anterolisthesis of T9 on T10. Is grade 1 anterolisthesis of T12 on L1. Mild retrolisthesis of L2 on L3 and L3 on L4, unchanged from prior exam. Vertebrae: Compared to prior exam there is a new superior endplate compression deformity of the T10 level. Post kyphoplasty changes at T12 and L1. Unchanged pincer type compression deformity at the L1 level. Unchanged chronic compression deformities at T5 and T6. Unchanged chronic superior endplate compression deformity at T3. There is likely osseous hemangioma at T2. Cord: There are multiple demyelinating lesions throughout the thoracic spinal cord with near confluence T2 hyperintense signal in the upper thoracic spinal cord extending to the T11 level. Compared to prior exam a new more discrete focal lesion is seen at the T12-L1 level (series 22, image 34-36). There may be a focal region of contrast enhancement at the T5-T6 level (series 25, image 13). Paraspinal and other soft tissues: Negative. Disc levels: There is mild spinal canal narrowing at the L1 level secondary to retropulsion of the fracture fragment, unchanged from prior. IMPRESSION: 1. Findings consistent with the patient's history of multiple sclerosis. No new lesions are visualized in the brain or cervical spine. No evidence of active demyelination in the cervical spine. 2. Extensive demyelinating lesions throughout the thoracic spinal cord, with a new demyelinating lesion seen at the T12-L1 level. There may be a  focal region of contrast enhancement at the T5-T6 level, which could suggest suggesting active demyelination. 3. New acute to subacute superior endplate compression deformity at the T10 level. Electronically Signed   By: Lorenza Cambridge M.D.   On: 06/15/2022 09:44   MR THORACIC SPINE W WO CONTRAST  Result Date: 06/15/2022 CLINICAL DATA:  MS EXAM: MRI HEAD WITHOUT AND WITH CONTRAST MRI CERVICAL SPINE WITHOUT AND WITH CONTRAST MRI CERVICAL THORACIC WITHOUT AND CONTRAST CONTRAST:  5mL GADAVIST GADOBUTROL 1 MMOL/ML IV SOLN TECHNIQUE: Multiplanar, multiecho pulse sequences of the brain and surrounding structures, and cervical and thoracic spine were obtained without and with intravenous contrast. COMPARISON:  12/31/21 MRI Brain, Cervical Spine, and Thoracic SPine FINDINGS: MRI HEAD FINDINGS Brain: No acute infarction, hemorrhage, hydrocephalus, extra-axial collection. Redemonstrated are extensive periventricular and juxtacortical T2/FLAIR hyperintense lesions compatible with the  patient's history of demyelinating disease. No new lesions are visualized. No evidence of diffusion restriction or contrast enhancement to suggest active demyelination. There are several T1 black holes in the bilateral cerebral hemispheres, suggestive chronic volume loss . Vascular: Normal flow voids. Skull and upper cervical spine: Normal marrow signal. Sinuses/Orbits: Bilateral lens replacement. Mild bilateral ethmoid sinus mucosal thickening. Other: None MRI CERVICAL SPINE FINDINGS Limitations: Assessment is slightly limited due to the presence of motion artifact. Alignment: There is trace retrolisthesis of C2 on C3 and C3 on C4. Vertebrae: No fracture, evidence of discitis, or bone lesion. Cord: Redemonstrated are findings of extensive demyelinating lesions in the cervical spinal cord. No definite contrast-enhancing lesions are visualized to suggest active demyelination. T2 hyperintense lesions are seen at the: Craniocervical junction  (series 3, image 7), at the C2-C3 level (series 3, image 7), C3-C4 level (series 4, image 10), C7-T1 level (series 4, image 25). There is chronic volume loss of the C7-T1 level, unchanged from prior exam. Posterior Fossa, vertebral arteries, paraspinal tissues: Negative. Disc levels: No evidence of high-grade spinal canal stenosis. MRI THORACIC SPINE FINDINGS Limitations: Assessment for the presence of new lesions is limtied due to motion artifact. Alignment: There is grade 1 anterolisthesis of T9 on T10. Is grade 1 anterolisthesis of T12 on L1. Mild retrolisthesis of L2 on L3 and L3 on L4, unchanged from prior exam. Vertebrae: Compared to prior exam there is a new superior endplate compression deformity of the T10 level. Post kyphoplasty changes at T12 and L1. Unchanged pincer type compression deformity at the L1 level. Unchanged chronic compression deformities at T5 and T6. Unchanged chronic superior endplate compression deformity at T3. There is likely osseous hemangioma at T2. Cord: There are multiple demyelinating lesions throughout the thoracic spinal cord with near confluence T2 hyperintense signal in the upper thoracic spinal cord extending to the T11 level. Compared to prior exam a new more discrete focal lesion is seen at the T12-L1 level (series 22, image 34-36). There may be a focal region of contrast enhancement at the T5-T6 level (series 25, image 13). Paraspinal and other soft tissues: Negative. Disc levels: There is mild spinal canal narrowing at the L1 level secondary to retropulsion of the fracture fragment, unchanged from prior. IMPRESSION: 1. Findings consistent with the patient's history of multiple sclerosis. No new lesions are visualized in the brain or cervical spine. No evidence of active demyelination in the cervical spine. 2. Extensive demyelinating lesions throughout the thoracic spinal cord, with a new demyelinating lesion seen at the T12-L1 level. There may be a focal region of contrast  enhancement at the T5-T6 level, which could suggest suggesting active demyelination. 3. New acute to subacute superior endplate compression deformity at the T10 level. Electronically Signed   By: Lorenza Cambridge M.D.   On: 06/15/2022 09:44   DG Hip Unilat W or Wo Pelvis 2-3 Views Right  Result Date: 06/14/2022 CLINICAL DATA:  Bilateral leg weakness. EXAM: DG HIP (WITH OR WITHOUT PELVIS) 2-3V RIGHT COMPARISON:  None Available. FINDINGS: There is no evidence of hip fracture or dislocation. Degenerative disc disease noted within the imaged portions of the lumbar spine. Moderate retained stool identified within the rectum. Calcified uterine fibroid noted in the pelvis. IMPRESSION: 1. No acute findings. 2. Calcified uterine fibroid. 3. Moderate retained stool within the rectum. Electronically Signed   By: Signa Kell M.D.   On: 06/14/2022 10:20   CT Lumbar Spine Wo Contrast  Result Date: 06/14/2022 CLINICAL DATA:  Colles weakness. New  incontinence. Patient has a history of a mass. EXAM: CT LUMBAR SPINE WITHOUT CONTRAST TECHNIQUE: Multidetector CT imaging of the lumbar spine was performed without intravenous contrast administration. Multiplanar CT image reconstructions were also generated. RADIATION DOSE REDUCTION: This exam was performed according to the departmental dose-optimization program which includes automated exposure control, adjustment of the mA and/or kV according to patient size and/or use of iterative reconstruction technique. COMPARISON:  Thoracic spine MRI dated 12/31/2021. FINDINGS: Segmentation: 5 lumbar type vertebrae. Alignment: Slight retrolisthesis of L2 on L3. Mild levoscoliosis, apex at L2-L3. Vertebrae: Chronic fractures of T11, T12, L1 and L2, T12 and L1 previously treated with vertebroplasty. These findings are stable from the prior thoracic MRI. No other fractures.  No bone lesions. Paraspinal and other soft tissues: No soft tissue mass. No spinal canal mass or fluid collection. Skin  aortic atherosclerotic calcifications. Disc levels: T12-L1: Posterior aspect of the fractured L1 vertebra mildly indents the ventral thecal sac, without significant spinal canal narrowing. Neural foramina are well preserved. No disc bulging or disc herniation. L1-L2: No disc bulging. No evidence of a disc herniation. Central spinal canal and neural foramina are well preserved. L2-L3: Lower endplate of the fractured L2 vertebra mildly indents the ventral thecal sac without significant spinal canal narrowing. Moderate bilateral neural foraminal narrowing due to a combination of the prominent lower endplate and facet degenerative changes and the mild degenerative retrolisthesis. No convincing disc herniation. L3-L4: Minor disc bulging. No disc herniation. No significant stenosis. L4-L5: Mild diffuse disc bulging with endplate spurring. Mild facet degenerative change. Mild narrowing of the central spinal canal. Lateral recesses relatively well preserved. Moderate bilateral neural foraminal narrowing. No disc herniation. L5-S1: Minor disc bulging. No disc herniation. No significant stenosis. IMPRESSION: 1. No acute fracture or acute finding. 2. Chronic fractures of T11, T12, L1 and L2. 3. Degenerative changes as detailed. No disc herniation. Mild central spinal canal narrowing at L4-L5. No findings, however, to account for new incontinence. Electronically Signed   By: Amie Portland M.D.   On: 06/14/2022 10:00   CT Head Wo Contrast  Result Date: 06/14/2022 CLINICAL DATA:  History of multiple sclerosis. Worsening bilateral lower extremity weakness. EXAM: CT HEAD WITHOUT CONTRAST TECHNIQUE: Contiguous axial images were obtained from the base of the skull through the vertex without intravenous contrast. RADIATION DOSE REDUCTION: This exam was performed according to the departmental dose-optimization program which includes automated exposure control, adjustment of the mA and/or kV according to patient size and/or use of  iterative reconstruction technique. COMPARISON:  Brain MRI, 12/31/2021. FINDINGS: Brain: No evidence of acute infarction, hemorrhage, hydrocephalus, extra-axial collection or mass lesion/mass effect. Bilateral patchy areas of periventricular white matter hypoattenuation consistent with MS plaques and without significant change from the prior brain MRI. Vascular: No hyperdense vessel or unexpected calcification. Skull: Normal. Negative for fracture or focal lesion. Sinuses/Orbits: Globes and orbits are unremarkable. Mild scattered ethmoid sinus mucosal thickening. Other: None. IMPRESSION: 1. No acute intracranial abnormalities. 2. White matter changes consistent with the history of MS, and without significant change from the brain MRI dated 12/31/2021. Electronically Signed   By: Amie Portland M.D.   On: 06/14/2022 09:53    Scheduled Meds:  enoxaparin (LOVENOX) injection  40 mg Subcutaneous Q24H   levothyroxine  50 mcg Oral QAC breakfast   sertraline  25 mg Oral q AM   simvastatin  20 mg Oral q1800   Siponimod Fumarate  1 mg Oral q morning   zonisamide  100 mg Oral q AM  Continuous Infusions:  methylPREDNISolone (SOLU-MEDROL) injection 1,000 mg (06/15/22 1516)     LOS: 1 day   Time spent: 35 mins  Taylie Helder Laural Benes, MD How to contact the [facility] Attending or Consulting provider 7A - 7P or covering provider during after hours 7P -7A, for this patient?  Check the care team in [facility] and look for a) attending/consulting [facility] provider listed and b) the [facility] team listed Log into [email or web address] and use [place]'s universal password to access. If you do not have the password, please contact the hospital operator. Locate the [facility] provider you are looking for under [facility] and page to a number that you can be directly reached. If you still have difficulty reaching the provider, please page the [facility] (Director on Call) for the Hospitalists listed on amion for assistance.  06/16/2022, 9:30 AM

## 2022-06-16 NOTE — TOC Initial Note (Signed)
Transition of Care [facility]) - Initial/Assessment Note    Patient Details  Name: Pamela Roman MRN: [id number] Date of Birth: [birth year]  [facility] CM/SW Contact:    Shade Flood, LCSW Phone Number: 06/16/2022, 4:12 PM  Clinical Narrative:                  Pt admitted from home. PT/OT recommending SNF rehab at dc. Met with pt at bedside today to assess. Pt uses a power wheelchair at home though she can ambulate some with her walker and she was previously independent in transfers. Pt agreeable to SNF rehab.   CMS provider options reviewed and will refer as requested. MD anticipating pt will remain hospitalized at least two more days.  TOC will start bed search and PASRR.  Expected Discharge Plan: Skilled Nursing Facility Barriers to Discharge: Continued Medical Work up   Patient Goals and CMS Choice Patient states their goals for this hospitalization and ongoing recovery are:: rehab CMS Medicare.gov Compare Post Acute Care list provided to:: Patient Choice offered to / list presented to : Patient  Expected Discharge Plan and Services Expected Discharge Plan: [place] In-house Referral: Clinical Social Work   Post Acute Care Choice: [place] Living arrangements for the past 2 months: [place]                                      Prior Living Arrangements/Services Living arrangements for the past 2 months: Single Family Home Lives with:: Self Patient language and need for interpreter reviewed:: Yes Do you feel safe going back to the place where you live?: Yes      Need for Family Participation in Patient Care: Yes (Comment) Care giver support system in place?: Yes (comment) Current home services: DME, Home PT Criminal Activity/Legal Involvement Pertinent to Current Situation/Hospitalization: No - Comment as needed  Activities of Daily Living Home Assistive Devices/Equipment: Wheelchair, Environmental consultant (specify  type), Hearing aid, Eyeglasses ADL Screening (condition at time of admission) Patient's cognitive ability adequate to safely complete daily activities?: Yes Is the patient deaf or have difficulty hearing?: Yes Does the patient have difficulty seeing, even when wearing glasses/contacts?: No Does the patient have difficulty concentrating, remembering, or making decisions?: Yes Patient able to express need for assistance with ADLs?: Yes Does the patient have difficulty dressing or bathing?: No Independently performs ADLs?: No Communication: Independent Dressing (OT): Independent Grooming: Independent Feeding: Independent Bathing: Independent Toileting: Needs assistance Is this a change from baseline?: Change from baseline, expected to last <3 days In/Out Bed: Dependent Is this a change from baseline?: Change from baseline, expected to last <3 days Walks in Home: Needs assistance (uses w/c) Is this a change from baseline?: Pre-admission baseline Does the patient have difficulty walking or climbing stairs?: Yes Weakness of Legs: Both Weakness of Arms/Hands: Both  Permission Sought/Granted Permission sought to share information with : Chartered certified accountant granted to share information with : Yes, Verbal Permission Granted     Permission granted to share info w AGENCY: [facility]        Emotional Assessment Appearance:: Appears stated age Attitude/Demeanor/Rapport: Engaged Affect (typically observed): Pleasant Orientation: : Oriented to Self, Oriented to Place, Oriented to  Time, Oriented to Situation Alcohol / Substance Use: Not Applicable Psych Involvement: No (comment)  Admission diagnosis:  Bilateral leg weakness [R29.898] Multiple sclerosis exacerbation ([place]) [G35] Patient Active Problem List  Diagnosis Date Noted   Multiple sclerosis exacerbation ([place]) 06/15/2022   Bilateral leg weakness 06/14/2022   Physical deconditioning 02/25/2022   Hospital discharge  follow-up 02/25/2022   MDD (major depressive disorder), recurrent episode, moderate ([place]) 02/25/2022   Encounter for examination following treatment at hospital 07/16/2021   Pharyngoesophageal dysphagia 03/12/2021   Migraine 08/09/2020   Hypothyroidism 08/09/2020   HLD (hyperlipidemia) 08/09/2020   Osteoporosis 08/09/2020   Multiple falls 08/09/2020   Weight loss 08/09/2020   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 [address] Phone: [phone] Fax: [phone]  [facility] [place], [place] [address] [place] [address] Phone: [phone] Fax: [phone]     Social Determinants of Health ([place]) Interventions    Readmission Risk Interventions     No data to display

## 2022-06-16 NOTE — [place] FL2 (Signed)
[place] MEDICAID FL2 LEVEL OF CARE FORM     IDENTIFICATION  Patient Name: Pamela Roman Birthdate: [birth year] Sex: female Admission Date (Current Location): 06/14/2022  [facility] and [place] Number:  [employer] and Address:  [facility],  [address] [place] [address]      Provider Number: [id number]  Attending Physician Name and Address:  Cleora Fleet, MD  Relative Name and Phone Number:       Current Level of Care: Hospital Recommended Level of Care: Skilled Nursing Facility Prior Approval Number:    Date Approved/Denied:   PASRR Number:    Discharge Plan: SNF    Current Diagnoses: Patient Active Problem List   Diagnosis Date Noted   Multiple sclerosis exacerbation ([facility]) 06/15/2022   Bilateral leg weakness 06/14/2022   Physical deconditioning 02/25/2022   Hospital discharge follow-up 02/25/2022   MDD (major depressive disorder), recurrent episode, moderate ([facility]) 02/25/2022   Encounter for examination following treatment at hospital 07/16/2021   Pharyngoesophageal dysphagia 03/12/2021   Migraine 08/09/2020   Hypothyroidism 08/09/2020   HLD (hyperlipidemia) 08/09/2020   Osteoporosis 08/09/2020   Multiple falls 08/09/2020   Weight loss 08/09/2020   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   Spinal stenosis of lumbar region 03/21/2019   Multiple sclerosis ([facility]) 11/15/2017   Thoracic compression fracture ([facility]) 11/15/2017   Low back pain 11/15/2017    Orientation RESPIRATION BLADDER Height & Weight     Self, Time, Situation, Place  Normal Incontinent Weight: 126 lb 1.7 oz (57.2 kg) Height:  5\' 3"  (160 cm)  BEHAVIORAL SYMPTOMS/MOOD NEUROLOGICAL BOWEL NUTRITION STATUS      Incontinent Diet (see dc summary)  AMBULATORY STATUS COMMUNICATION OF NEEDS Skin   Extensive Assist Verbally Normal                       Personal Care Assistance Level of Assistance  Bathing, Feeding, Dressing Bathing Assistance:  Limited assistance Feeding assistance: Independent Dressing Assistance: Limited assistance     Functional Limitations Info  Sight, Hearing, Speech Sight Info: Adequate Hearing Info: Adequate Speech Info: Adequate    SPECIAL CARE FACTORS FREQUENCY  PT (By licensed PT), OT (By licensed OT)     PT Frequency: 5x week OT Frequency: 3x week            Contractures Contractures Info: Not present    Additional Factors Info  Code Status, Allergies, Psychotropic Code Status Info: Full Allergies Info: Codeine Psychotropic Info: Zoloft         Current Medications (06/16/2022):  This is the current hospital active medication list Current Facility-Administered Medications  Medication Dose Route Frequency Provider Last Rate Last Admin   acetaminophen (TYLENOL) tablet 650 mg  650 mg Oral Q6H PRN Johnson, Clanford L, MD       Or   acetaminophen (TYLENOL) suppository 650 mg  650 mg Rectal Q6H PRN Johnson, Clanford L, MD       bisacodyl (DULCOLAX) EC tablet 5 mg  5 mg Oral Daily PRN Johnson, Clanford L, MD       enoxaparin (LOVENOX) injection 40 mg  40 mg Subcutaneous Q24H Johnson, Clanford L, MD   40 mg at 06/16/22 0915   fentaNYL (SUBLIMAZE) injection 12.5 mcg  12.5 mcg Intravenous Q2H PRN Johnson, Clanford L, MD   12.5 mcg at 06/16/22 0306   hydrALAZINE (APRESOLINE) injection 5 mg  5 mg Intravenous Q4H PRN 14/12/23, MD  levothyroxine (SYNTHROID) tablet 50 mcg  50 mcg Oral QAC breakfast Laural Benes, Clanford L, MD   50 mcg at 06/16/22 0539   methylPREDNISolone sodium succinate (SOLU-MEDROL) 1,000 mg in sodium chloride 0.9 % 50 mL IVPB  1,000 mg Intravenous Daily Rejeana Brock, MD 66 mL/hr at 06/16/22 0954 1,000 mg at 06/16/22 0954   ondansetron (ZOFRAN) tablet 4 mg  4 mg Oral Q6H PRN Johnson, Clanford L, MD       Or   ondansetron (ZOFRAN) injection 4 mg  4 mg Intravenous Q6H PRN Johnson, Clanford L, MD       oxyCODONE (Oxy IR/ROXICODONE) immediate release tablet 5  mg  5 mg Oral Q6H PRN Johnson, Clanford L, MD   5 mg at 06/16/22 0915   sertraline (ZOLOFT) tablet 25 mg  25 mg Oral q AM Johnson, Clanford L, MD   25 mg at 06/16/22 0817   simvastatin (ZOCOR) tablet 20 mg  20 mg Oral q1800 Johnson, Clanford L, MD   20 mg at 06/15/22 1815   Siponimod Fumarate TABS 1 mg  1 mg Oral q morning Johnson, Clanford L, MD   1 mg at 06/16/22 0953   zolpidem (AMBIEN) tablet 5 mg  5 mg Oral QHS PRN Johnson, Clanford L, MD       zonisamide (ZONEGRAN) capsule 100 mg  100 mg Oral q AM Johnson, Clanford L, MD   100 mg at 06/16/22 4656     Discharge Medications: Please see discharge summary for a list of discharge medications.  Relevant Imaging Results:  Relevant Lab Results:   Additional Information SSN: [id number]  Elliot Gault, LCSW

## 2022-06-17 NOTE — Progress Notes (Signed)
PROGRESS NOTE  Pamela Roman:[id number] DOB: 1956/03/12 DOA: 06/14/2022 PCP: Anabel Halon, MD  Brief History:  66 y/o female with longstanding MS and followed by Dr. Everlena Cooper with [facility] since relocating from [place] presented to ED today with progressive weakness in lower extremities.  She is noted to have progressive multiple sclerosis, migraines, bowel and bladder dysfunction, chronic neuropathic pain, pseudo exacerbation related to heat and overexertion, vitamin D deficiency, hypothyroidism, hyperlipidemia, multiple falls.  She has degeneration of the lumbar disks, chronic low back pain, osteoporosis, pharyngeal esophageal dysphagia, spinal stenosis and thoracic compression fracture.  She reports that her baseline is that she is able to ambulate with the use of her motorized wheelchair with the assistance of her sister whom she lives with and she manages most of her ADLs.  She reports that she has been treated with Betaseron; Avonex; Tysabri from around 2011 until September 2020 due to positive JC virus antibody.  She says she had an 71-month period of taking no medication after that.  Patient reports that she has been taking Mayzent for maintenance of MS since July 2021.  She reportedly had a severe exacerbation of her MS several months ago requiring a high-dose prednisone taper and it was associated with a suicide attempt that she believes was related to the MS flare.  Patient has known chronic debilitating depression of chronic illness and takes antidepressants.    After completing the prednisone taper for the MS flare she felt like she was back to baseline.  Unfortunately approximately 1 week ago she had an episode where she had severe lower extremity weakness but it only lasted 1 day.  When she woke up the next morning she felt back to her baseline.  She reported she felt well for several days and then yesterday she became very weak in her lower extremities again but  this was more severe than the prior episode.  She went to bed but when she woke up her symptoms had not resolved like they had before.  She was not able to get to the bathroom because she was so severely weak and in her legs.  She lost strength in her ankles and lower legs.   She reports intermittent bowel and bladder incontinence but this is not new.  Her main symptom to this as she has not been strong enough to lift her body onto a seat or chair to use the bathroom.  She reports that her vision and her hearing continues to deteriorate.  She was evaluated in the ED by Nehemiah Settle small who had a consultation with neurologist Dr. Selina Cooley who recommended that patient be admitted to [facility] for MRI brain, cervical and thoracic spine with and without contrast.       Assessment/Plan:  Progressive Multiple Sclerosis Exacerbation - acute MS flare confirmed by imaging  - discussed with Dr. Amada Jupiter 12/11, ok to keep patient at [facility] and will do teleneurology consult  - MRI brain, Cspine, Tspine with and without contrast for 12/11 at [facility] with patchy areas of enhancement T5-T6 with extensive demyelinating lesions throughout T-cord - neuro started on IV solumedrol 1 gm daily x 3 doses  - neurology consultation appreciated -strength improving after IV steroids   Progressive bilateral LE weakness  - related to MS flare - obtain PT evaluation >>SNF - MRI studies ordered as above  - hopefully symptoms will improve after high dose steroid treatments   Physical deconditioning/Frequent Falls -  PT eval recommending SNF - fall precautions recommended   Major Depressive Disorder - pt reports suicide attempt with last MS flare - continue home antidepressants  - pt denies suicidal thoughts or plans and contracted for safety   Hypothyroidism  - resume home levothyroxine   Pharyngoesophageal dysphagia  - chronic - dysphagia 3 diet ordered - aspiration precautions  - elevate head of bed    Chronic back  pain / DDD  - pain medication ordered as needed         Family Communication:  no Family at bedside  Consultants:  neurology  Code Status:  FULL   DVT Prophylaxis:  Lacey Lovenox   Procedures: As Listed in Progress Note Above  Antibiotics: None      Subjective: Pt states legs are feeling stronger.  Denies f/c, cp, sob, n/v/d  Objective: Vitals:   06/16/22 1312 06/16/22 2014 06/17/22 0421 06/17/22 1246  BP: 99/60 137/75 101/62 127/81  Pulse: 61 83 60 61  Resp:  16 16 20   Temp: 98.1 F (36.7 C) (!) 97 F (36.1 C) (!) 96.7 F (35.9 C) 98 F (36.7 C)  TempSrc: Oral   Oral  SpO2: 97% 96% 95% 95%  Weight:      Height:        Intake/Output Summary (Last 24 hours) at 06/17/2022 1748 Last data filed at 06/17/2022 1246 Gross per 24 hour  Intake 1080 ml  Output 900 ml  Net 180 ml   Weight change:  Exam:  General:  Pt is alert, follows commands appropriately, not in acute distress HEENT: No icterus, No thrush, No neck mass, [place]/AT Cardiovascular: RRR, S1/S2, no rubs, no gallops Respiratory: CTA bilaterally, no wheezing, no crackles, no rhonchi Abdomen: Soft/+BS, non tender, non distended, no guarding Extremities: No edema, No lymphangitis, No petechiae, No rashes, no synovitis   Data Reviewed: I have personally reviewed following labs and imaging studies Basic Metabolic Panel: Recent Labs  Lab 06/14/22 0948 06/14/22 1458 06/15/22 0516  NA 139  --  137  K 4.1  --  3.6  CL 109  --  107  CO2 23  --  19*  GLUCOSE 122*  --  126*  BUN 16  --  18  CREATININE 0.74 0.72 0.75  CALCIUM 8.9  --  8.5*  MG  --   --  1.9   Liver Function Tests: Recent Labs  Lab 06/14/22 0948  AST 28  ALT 22  ALKPHOS 104  BILITOT 0.6  PROT 6.8  ALBUMIN 4.1   No results for input(s): "LIPASE", "AMYLASE" in the last 168 hours. No results for input(s): "AMMONIA" in the last 168 hours. Coagulation Profile: No results for input(s): "INR", "PROTIME" in the last 168  hours. CBC: Recent Labs  Lab 06/14/22 0948 06/14/22 1458  WBC 5.3 4.7  NEUTROABS 4.4  --   HGB 13.2 13.3  HCT 40.6 40.7  MCV 95.1 94.0  PLT 197 200   Cardiac Enzymes: No results for input(s): "CKTOTAL", "CKMB", "CKMBINDEX", "TROPONINI" in the last 168 hours. BNP: Invalid input(s): "POCBNP" CBG: Recent Labs  Lab 06/14/22 0900  GLUCAP 127*   HbA1C: No results for input(s): "HGBA1C" in the last 72 hours. Urine analysis:    Component Value Date/Time   COLORURINE YELLOW 06/14/2022 1057   APPEARANCEUR CLOUDY (A) 06/14/2022 1057   LABSPEC 1.017 06/14/2022 1057   PHURINE 8.0 06/14/2022 1057   GLUCOSEU NEGATIVE 06/14/2022 1057   HGBUR NEGATIVE 06/14/2022 1057   BILIRUBINUR NEGATIVE 06/14/2022 1057  KETONESUR NEGATIVE 06/14/2022 1057   PROTEINUR NEGATIVE 06/14/2022 1057   NITRITE NEGATIVE 06/14/2022 1057   LEUKOCYTESUR NEGATIVE 06/14/2022 1057   Sepsis Labs: @LABRCNTIP (procalcitonin:4,lacticidven:4) )No results found for this or any previous visit (from the past 240 hour(s)).   Scheduled Meds:  enoxaparin (LOVENOX) injection  40 mg Subcutaneous Q24H   levothyroxine  50 mcg Oral QAC breakfast   sertraline  25 mg Oral q AM   simvastatin  20 mg Oral q1800   Siponimod Fumarate  1 mg Oral q morning   zonisamide  100 mg Oral q AM   Continuous Infusions:  Procedures/Studies: MR Brain W and Wo Contrast  Result Date: 06/15/2022 CLINICAL DATA:  MS EXAM: MRI HEAD WITHOUT AND WITH CONTRAST MRI CERVICAL SPINE WITHOUT AND WITH CONTRAST MRI CERVICAL THORACIC WITHOUT AND CONTRAST CONTRAST:  98mL GADAVIST GADOBUTROL 1 MMOL/ML IV SOLN TECHNIQUE: Multiplanar, multiecho pulse sequences of the brain and surrounding structures, and cervical and thoracic spine were obtained without and with intravenous contrast. COMPARISON:  12/31/21 MRI Brain, Cervical Spine, and Thoracic SPine FINDINGS: MRI HEAD FINDINGS Brain: No acute infarction, hemorrhage, hydrocephalus, extra-axial collection.  Redemonstrated are extensive periventricular and juxtacortical T2/FLAIR hyperintense lesions compatible with the patient's history of demyelinating disease. No new lesions are visualized. No evidence of diffusion restriction or contrast enhancement to suggest active demyelination. There are several T1 black holes in the bilateral cerebral hemispheres, suggestive chronic volume loss . Vascular: Normal flow voids. Skull and upper cervical spine: Normal marrow signal. Sinuses/Orbits: Bilateral lens replacement. Mild bilateral ethmoid sinus mucosal thickening. Other: None MRI CERVICAL SPINE FINDINGS Limitations: Assessment is slightly limited due to the presence of motion artifact. Alignment: There is trace retrolisthesis of C2 on C3 and C3 on C4. Vertebrae: No fracture, evidence of discitis, or bone lesion. Cord: Redemonstrated are findings of extensive demyelinating lesions in the cervical spinal cord. No definite contrast-enhancing lesions are visualized to suggest active demyelination. T2 hyperintense lesions are seen at the: Craniocervical junction (series 3, image 7), at the C2-C3 level (series 3, image 7), C3-C4 level (series 4, image 10), C7-T1 level (series 4, image 25). There is chronic volume loss of the C7-T1 level, unchanged from prior exam. Posterior Fossa, vertebral arteries, paraspinal tissues: Negative. Disc levels: No evidence of high-grade spinal canal stenosis. MRI THORACIC SPINE FINDINGS Limitations: Assessment for the presence of new lesions is limtied due to motion artifact. Alignment: There is grade 1 anterolisthesis of T9 on T10. Is grade 1 anterolisthesis of T12 on L1. Mild retrolisthesis of L2 on L3 and L3 on L4, unchanged from prior exam. Vertebrae: Compared to prior exam there is a new superior endplate compression deformity of the T10 level. Post kyphoplasty changes at T12 and L1. Unchanged pincer type compression deformity at the L1 level. Unchanged chronic compression deformities at T5  and T6. Unchanged chronic superior endplate compression deformity at T3. There is likely osseous hemangioma at T2. Cord: There are multiple demyelinating lesions throughout the thoracic spinal cord with near confluence T2 hyperintense signal in the upper thoracic spinal cord extending to the T11 level. Compared to prior exam a new more discrete focal lesion is seen at the T12-L1 level (series 22, image 34-36). There may be a focal region of contrast enhancement at the T5-T6 level (series 25, image 13). Paraspinal and other soft tissues: Negative. Disc levels: There is mild spinal canal narrowing at the L1 level secondary to retropulsion of the fracture fragment, unchanged from prior. IMPRESSION: 1. Findings consistent with the patient's history of  multiple sclerosis. No new lesions are visualized in the brain or cervical spine. No evidence of active demyelination in the cervical spine. 2. Extensive demyelinating lesions throughout the thoracic spinal cord, with a new demyelinating lesion seen at the T12-L1 level. There may be a focal region of contrast enhancement at the T5-T6 level, which could suggest suggesting active demyelination. 3. New acute to subacute superior endplate compression deformity at the T10 level. Electronically Signed   By: Marin Roberts M.D.   On: 06/15/2022 09:44   MR Cervical Spine W or Wo Contrast  Result Date: 06/15/2022 CLINICAL DATA:  MS EXAM: MRI HEAD WITHOUT AND WITH CONTRAST MRI CERVICAL SPINE WITHOUT AND WITH CONTRAST MRI CERVICAL THORACIC WITHOUT AND CONTRAST CONTRAST:  47mL GADAVIST GADOBUTROL 1 MMOL/ML IV SOLN TECHNIQUE: Multiplanar, multiecho pulse sequences of the brain and surrounding structures, and cervical and thoracic spine were obtained without and with intravenous contrast. COMPARISON:  12/31/21 MRI Brain, Cervical Spine, and Thoracic SPine FINDINGS: MRI HEAD FINDINGS Brain: No acute infarction, hemorrhage, hydrocephalus, extra-axial collection. Redemonstrated are  extensive periventricular and juxtacortical T2/FLAIR hyperintense lesions compatible with the patient's history of demyelinating disease. No new lesions are visualized. No evidence of diffusion restriction or contrast enhancement to suggest active demyelination. There are several T1 black holes in the bilateral cerebral hemispheres, suggestive chronic volume loss . Vascular: Normal flow voids. Skull and upper cervical spine: Normal marrow signal. Sinuses/Orbits: Bilateral lens replacement. Mild bilateral ethmoid sinus mucosal thickening. Other: None MRI CERVICAL SPINE FINDINGS Limitations: Assessment is slightly limited due to the presence of motion artifact. Alignment: There is trace retrolisthesis of C2 on C3 and C3 on C4. Vertebrae: No fracture, evidence of discitis, or bone lesion. Cord: Redemonstrated are findings of extensive demyelinating lesions in the cervical spinal cord. No definite contrast-enhancing lesions are visualized to suggest active demyelination. T2 hyperintense lesions are seen at the: Craniocervical junction (series 3, image 7), at the C2-C3 level (series 3, image 7), C3-C4 level (series 4, image 10), C7-T1 level (series 4, image 25). There is chronic volume loss of the C7-T1 level, unchanged from prior exam. Posterior Fossa, vertebral arteries, paraspinal tissues: Negative. Disc levels: No evidence of high-grade spinal canal stenosis. MRI THORACIC SPINE FINDINGS Limitations: Assessment for the presence of new lesions is limtied due to motion artifact. Alignment: There is grade 1 anterolisthesis of T9 on T10. Is grade 1 anterolisthesis of T12 on L1. Mild retrolisthesis of L2 on L3 and L3 on L4, unchanged from prior exam. Vertebrae: Compared to prior exam there is a new superior endplate compression deformity of the T10 level. Post kyphoplasty changes at T12 and L1. Unchanged pincer type compression deformity at the L1 level. Unchanged chronic compression deformities at T5 and T6. Unchanged  chronic superior endplate compression deformity at T3. There is likely osseous hemangioma at T2. Cord: There are multiple demyelinating lesions throughout the thoracic spinal cord with near confluence T2 hyperintense signal in the upper thoracic spinal cord extending to the T11 level. Compared to prior exam a new more discrete focal lesion is seen at the T12-L1 level (series 22, image 34-36). There may be a focal region of contrast enhancement at the T5-T6 level (series 25, image 13). Paraspinal and other soft tissues: Negative. Disc levels: There is mild spinal canal narrowing at the L1 level secondary to retropulsion of the fracture fragment, unchanged from prior. IMPRESSION: 1. Findings consistent with the patient's history of multiple sclerosis. No new lesions are visualized in the brain or cervical spine. No evidence  of active demyelination in the cervical spine. 2. Extensive demyelinating lesions throughout the thoracic spinal cord, with a new demyelinating lesion seen at the T12-L1 level. There may be a focal region of contrast enhancement at the T5-T6 level, which could suggest suggesting active demyelination. 3. New acute to subacute superior endplate compression deformity at the T10 level. Electronically Signed   By: Marin Roberts M.D.   On: 06/15/2022 09:44   MR THORACIC SPINE W WO CONTRAST  Result Date: 06/15/2022 CLINICAL DATA:  MS EXAM: MRI HEAD WITHOUT AND WITH CONTRAST MRI CERVICAL SPINE WITHOUT AND WITH CONTRAST MRI CERVICAL THORACIC WITHOUT AND CONTRAST CONTRAST:  65mL GADAVIST GADOBUTROL 1 MMOL/ML IV SOLN TECHNIQUE: Multiplanar, multiecho pulse sequences of the brain and surrounding structures, and cervical and thoracic spine were obtained without and with intravenous contrast. COMPARISON:  12/31/21 MRI Brain, Cervical Spine, and Thoracic SPine FINDINGS: MRI HEAD FINDINGS Brain: No acute infarction, hemorrhage, hydrocephalus, extra-axial collection. Redemonstrated are extensive periventricular  and juxtacortical T2/FLAIR hyperintense lesions compatible with the patient's history of demyelinating disease. No new lesions are visualized. No evidence of diffusion restriction or contrast enhancement to suggest active demyelination. There are several T1 black holes in the bilateral cerebral hemispheres, suggestive chronic volume loss . Vascular: Normal flow voids. Skull and upper cervical spine: Normal marrow signal. Sinuses/Orbits: Bilateral lens replacement. Mild bilateral ethmoid sinus mucosal thickening. Other: None MRI CERVICAL SPINE FINDINGS Limitations: Assessment is slightly limited due to the presence of motion artifact. Alignment: There is trace retrolisthesis of C2 on C3 and C3 on C4. Vertebrae: No fracture, evidence of discitis, or bone lesion. Cord: Redemonstrated are findings of extensive demyelinating lesions in the cervical spinal cord. No definite contrast-enhancing lesions are visualized to suggest active demyelination. T2 hyperintense lesions are seen at the: Craniocervical junction (series 3, image 7), at the C2-C3 level (series 3, image 7), C3-C4 level (series 4, image 10), C7-T1 level (series 4, image 25). There is chronic volume loss of the C7-T1 level, unchanged from prior exam. Posterior Fossa, vertebral arteries, paraspinal tissues: Negative. Disc levels: No evidence of high-grade spinal canal stenosis. MRI THORACIC SPINE FINDINGS Limitations: Assessment for the presence of new lesions is limtied due to motion artifact. Alignment: There is grade 1 anterolisthesis of T9 on T10. Is grade 1 anterolisthesis of T12 on L1. Mild retrolisthesis of L2 on L3 and L3 on L4, unchanged from prior exam. Vertebrae: Compared to prior exam there is a new superior endplate compression deformity of the T10 level. Post kyphoplasty changes at T12 and L1. Unchanged pincer type compression deformity at the L1 level. Unchanged chronic compression deformities at T5 and T6. Unchanged chronic superior endplate  compression deformity at T3. There is likely osseous hemangioma at T2. Cord: There are multiple demyelinating lesions throughout the thoracic spinal cord with near confluence T2 hyperintense signal in the upper thoracic spinal cord extending to the T11 level. Compared to prior exam a new more discrete focal lesion is seen at the T12-L1 level (series 22, image 34-36). There may be a focal region of contrast enhancement at the T5-T6 level (series 25, image 13). Paraspinal and other soft tissues: Negative. Disc levels: There is mild spinal canal narrowing at the L1 level secondary to retropulsion of the fracture fragment, unchanged from prior. IMPRESSION: 1. Findings consistent with the patient's history of multiple sclerosis. No new lesions are visualized in the brain or cervical spine. No evidence of active demyelination in the cervical spine. 2. Extensive demyelinating lesions throughout the thoracic spinal cord,  with a new demyelinating lesion seen at the T12-L1 level. There may be a focal region of contrast enhancement at the T5-T6 level, which could suggest suggesting active demyelination. 3. New acute to subacute superior endplate compression deformity at the T10 level. Electronically Signed   By: Lorenza Cambridge M.D.   On: 06/15/2022 09:44   DG Hip Unilat W or Wo Pelvis 2-3 Views Right  Result Date: 06/14/2022 CLINICAL DATA:  Bilateral leg weakness. EXAM: DG HIP (WITH OR WITHOUT PELVIS) 2-3V RIGHT COMPARISON:  None Available. FINDINGS: There is no evidence of hip fracture or dislocation. Degenerative disc disease noted within the imaged portions of the lumbar spine. Moderate retained stool identified within the rectum. Calcified uterine fibroid noted in the pelvis. IMPRESSION: 1. No acute findings. 2. Calcified uterine fibroid. 3. Moderate retained stool within the rectum. Electronically Signed   By: Signa Kell M.D.   On: 06/14/2022 10:20   CT Lumbar Spine Wo Contrast  Result Date:  06/14/2022 CLINICAL DATA:  Colles weakness. New incontinence. Patient has a history of a mass. EXAM: CT LUMBAR SPINE WITHOUT CONTRAST TECHNIQUE: Multidetector CT imaging of the lumbar spine was performed without intravenous contrast administration. Multiplanar CT image reconstructions were also generated. RADIATION DOSE REDUCTION: This exam was performed according to the departmental dose-optimization program which includes automated exposure control, adjustment of the mA and/or kV according to patient size and/or use of iterative reconstruction technique. COMPARISON:  Thoracic spine MRI dated 12/31/2021. FINDINGS: Segmentation: 5 lumbar type vertebrae. Alignment: Slight retrolisthesis of L2 on L3. Mild levoscoliosis, apex at L2-L3. Vertebrae: Chronic fractures of T11, T12, L1 and L2, T12 and L1 previously treated with vertebroplasty. These findings are stable from the prior thoracic MRI. No other fractures.  No bone lesions. Paraspinal and other soft tissues: No soft tissue mass. No spinal canal mass or fluid collection. Skin aortic atherosclerotic calcifications. Disc levels: T12-L1: Posterior aspect of the fractured L1 vertebra mildly indents the ventral thecal sac, without significant spinal canal narrowing. Neural foramina are well preserved. No disc bulging or disc herniation. L1-L2: No disc bulging. No evidence of a disc herniation. Central spinal canal and neural foramina are well preserved. L2-L3: Lower endplate of the fractured L2 vertebra mildly indents the ventral thecal sac without significant spinal canal narrowing. Moderate bilateral neural foraminal narrowing due to a combination of the prominent lower endplate and facet degenerative changes and the mild degenerative retrolisthesis. No convincing disc herniation. L3-L4: Minor disc bulging. No disc herniation. No significant stenosis. L4-L5: Mild diffuse disc bulging with endplate spurring. Mild facet degenerative change. Mild narrowing of the  central spinal canal. Lateral recesses relatively well preserved. Moderate bilateral neural foraminal narrowing. No disc herniation. L5-S1: Minor disc bulging. No disc herniation. No significant stenosis. IMPRESSION: 1. No acute fracture or acute finding. 2. Chronic fractures of T11, T12, L1 and L2. 3. Degenerative changes as detailed. No disc herniation. Mild central spinal canal narrowing at L4-L5. No findings, however, to account for new incontinence. Electronically Signed   By: Amie Portland M.D.   On: 06/14/2022 10:00   CT Head Wo Contrast  Result Date: 06/14/2022 CLINICAL DATA:  History of multiple sclerosis. Worsening bilateral lower extremity weakness. EXAM: CT HEAD WITHOUT CONTRAST TECHNIQUE: Contiguous axial images were obtained from the base of the skull through the vertex without intravenous contrast. RADIATION DOSE REDUCTION: This exam was performed according to the departmental dose-optimization program which includes automated exposure control, adjustment of the mA and/or kV according to patient size and/or use  of iterative reconstruction technique. COMPARISON:  Brain MRI, 12/31/2021. FINDINGS: Brain: No evidence of acute infarction, hemorrhage, hydrocephalus, extra-axial collection or mass lesion/mass effect. Bilateral patchy areas of periventricular white matter hypoattenuation consistent with MS plaques and without significant change from the prior brain MRI. Vascular: No hyperdense vessel or unexpected calcification. Skull: Normal. Negative for fracture or focal lesion. Sinuses/Orbits: Globes and orbits are unremarkable. Mild scattered ethmoid sinus mucosal thickening. Other: None. IMPRESSION: 1. No acute intracranial abnormalities. 2. White matter changes consistent with the history of MS, and without significant change from the brain MRI dated 12/31/2021. Electronically Signed   By: Lajean Manes M.D.   On: 06/14/2022 09:53    Orson Eva, DO  [facility]  If 7PM-7AM, please  contact night-coverage [email or web address] Password [email or web address] 06/17/2022, 5:48 PM   LOS: 2 days

## 2022-06-17 NOTE — Telephone Encounter (Signed)
Patient called in and said she will give [place] a call once she know what the plans are for rehab.

## 2022-06-17 NOTE — Telephone Encounter (Signed)
LMOVM for patient, Per Dr.jaffe please give the office a call once you are home to schedule a visit to discuss other DMT.

## 2022-06-17 NOTE — Progress Notes (Signed)
Patient up in chair this shift with PT , she continues to have pain controlled by PRN oxycodone. She is alert and oriented and able to feed self, purewick and peri-care provided throughout shift. Patient in good spirits and very thankful for the care she has received at [facility]. Call bell and hydration within reach,. Will continue to monitor

## 2022-06-17 NOTE — Progress Notes (Signed)
Physical Therapy Treatment Patient Details Name: Pamela Roman MRN: [id number] DOB: [birth year] Today's Date: 06/17/2022   History of Present Illness 66 y.o. F admitted to [facility] on 12/10 due to bilateral LE weakness. Pt has progressive multiple sclerosis, with intermittent short episodes of weakness, however this time she reports it is worse and has lasted longer than normal. PMH significant for multiple sclerosis, migraines, bowel and bladder dysfunction, chronic neuropathic pain, pseudo exacerbation related to heat and overexertion, vitamin D deficiency, hypothyroidism, hyperlipidemia, multiple falls.    PT Comments    Pt sitting up in bed, eager to work with therapy and attempt ambulation.  Pt required min assist with standing from chair as she was a little unsteady initially.  Ataxic gait with stiff knees and wide BOS.  Pt able to increase distance of 30 feet using RW and min assist and returned to bed.  For return to supine, pt required positional instructions and correct usage of UE. Pt was able to lift LE's independently into bed and scoot up with verbal cues.  Pt very pleased with progress made today.  Social worker in with pt informing of possible discharge to skilled care tomorrow and nursing informed of need to replace pure wick.    Recommendations for follow up therapy are one component of a multi-disciplinary discharge planning process, led by the attending physician.  Recommendations may be updated based on patient status, additional functional criteria and insurance authorization.  Follow Up Recommendations  Skilled nursing-short term rehab (<3 hours/day) Can patient physically be transported by private vehicle: No   Assistance Recommended at Discharge Set up Supervision/Assistance  Patient can return home with the following A lot of help with bathing/dressing/bathroom;A lot of help with walking and/or transfers;Help with stairs or ramp for entrance;Assistance with cooking/housework    Equipment Recommendations  None recommended by PT    Recommendations for Other Services       Precautions / Restrictions Precautions Precautions: Fall Restrictions Weight Bearing Restrictions: No     Mobility  Bed Mobility Overal bed mobility: Needs Assistance Bed Mobility: Sit to Supine       Sit to supine: Min guard   General bed mobility comments: Pt required positional instructions, correct usage of UE for return to supine.  Pt was able to lift LE's independently into bed and scoot up with verbal cues.    Transfers Overall transfer level: Needs assistance Equipment used: Rolling walker (2 wheels) Transfers: Sit to/from Stand Sit to Stand: Min assist           General transfer comment: Pt needing assist to boost from EOB using RW with extended time and some laboerd steps to recliner. Pt able to stand from recliner with more min G assist using RW.    Ambulation/Gait Ambulation/Gait assistance: Min assist Gait Distance (Feet): 30 Feet Assistive device: Rolling walker (2 wheels)     Gait velocity interpretation: <1.31 ft/sec, indicative of household ambulator   General Gait Details: slow cadence with very stiff LE's.  Cues to bend knees and for directional cues.  ataxic gait.   Stairs             Wheelchair Mobility    Modified Rankin (Stroke Patients Only)       Balance  Cognition Arousal/Alertness: Awake/alert Behavior During Therapy: WFL for tasks assessed/performed Overall Cognitive Status: Within Functional Limits for tasks assessed                                                 Pertinent Vitals/Pain Pain Assessment Pain Assessment: No/denies pain Pain Descriptors / Indicators:  ("a little pain")    Home Living                          Prior Function            PT Goals (current goals can now be found in the care plan section) Acute  Rehab PT Goals Patient Stated Goal: return home after rehab PT Goal Formulation: With patient Time For Goal Achievement: 06/29/22 Potential to Achieve Goals: Good Progress towards PT goals: Progressing toward goals    Frequency    Min 3X/week      PT Plan Current plan remains appropriate    Co-evaluation PT/OT/SLP Co-Evaluation/Treatment: Yes            AM-PAC PT "6 Clicks" Mobility   Outcome Measure  Help needed turning from your back to your side while in a flat bed without using bedrails?: A Little Help needed moving from lying on your back to sitting on the side of a flat bed without using bedrails?: A Little Help needed moving to and from a bed to a chair (including a wheelchair)?: A Lot Help needed standing up from a chair using your arms (e.g., wheelchair or bedside chair)?: A Lot Help needed to walk in hospital room?: A Lot Help needed climbing 3-5 steps with a railing? : Total 6 Click Score: 13    End of Session   Activity Tolerance: Patient tolerated treatment well;Patient limited by fatigue Patient left: with call bell/phone within reach;in bed;with nursing/sitter in room Nurse Communication: Mobility status;Other (comment) (need to replace pure wick) PT Visit Diagnosis: Unsteadiness on feet (R26.81);Other abnormalities of gait and mobility (R26.89);Muscle weakness (generalized) (M62.81)     Time: 7673-4193 PT Time Calculation (min) (ACUTE ONLY): 30 min  Charges:  $Gait Training: 8-22 mins $Therapeutic Activity: 8-22 mins                     Lurena Nida, PTA/CLT [facility] Ph: [phone]  Lurena Nida 06/17/2022, 5:06 PM

## 2022-06-17 NOTE — Telephone Encounter (Signed)
Pt called back in and left a message, she is still in the hospital. She stated she knows Dr. Everlena Cooper is consulting with another doctor, but she would like to speak with someone.

## 2022-06-17 NOTE — Progress Notes (Signed)
Occupational Therapy Treatment Patient Details Name: Pamela Roman MRN: [id number] DOB: Dec 02, 1955 Today's Date: 06/17/2022   History of present illness 66 y.o. F admitted to [facility] on 12/10 due to bilateral LE weakness. Pt has progressive multiple sclerosis, with intermittent short episodes of weakness, however this time she reports it is worse and has lasted longer than normal. PMH significant for multiple sclerosis, migraines, bowel and bladder dysfunction, chronic neuropathic pain, pseudo exacerbation related to heat and overexertion, vitamin D deficiency, hypothyroidism, hyperlipidemia, multiple falls.   OT comments  Pt agreeable to OT treatment today. Pt demonstrated improved bed mobility with HOB elevated. Only supervision assistance today. Pt then able to stand and transfer to chair from EOB with RW and min A. Pt seems to be much stronger then when evaluated. Sit to stand from chair was more min G assist. Pt was also able to lean forward seated at EOB to adjust socks, indicating that pt likely has improved in lower body dressing as well. Pt will benefit from continued OT in the hospital and recommended venue below to increase strength, balance, and endurance for safe ADL's.      Recommendations for follow up therapy are one component of a multi-disciplinary discharge planning process, led by the attending physician.  Recommendations may be updated based on patient status, additional functional criteria and insurance authorization.    Follow Up Recommendations  Skilled nursing-short term rehab (<3 hours/day)     Assistance Recommended at Discharge Frequent or constant Supervision/Assistance  Patient can return home with the following  Assistance with cooking/housework;Help with stairs or ramp for entrance;A lot of help with walking and/or transfers;A lot of help with bathing/dressing/bathroom   Equipment Recommendations  None recommended by OT    Recommendations for Other Services       Precautions / Restrictions Precautions Precautions: Fall Restrictions Weight Bearing Restrictions: No       Mobility Bed Mobility Overal bed mobility: Needs Assistance Bed Mobility: Supine to Sit     Supine to sit: Supervision, Min guard, HOB elevated     General bed mobility comments: Labored movement but no physical assist needed given the [facility] is elevated.    Transfers Overall transfer level: Needs assistance Equipment used: Rolling walker (2 wheels) Transfers: Sit to/from Stand, Bed to chair/wheelchair/BSC Sit to Stand: Min assist     Step pivot transfers: Min assist     General transfer comment: Pt needing assist to boost from EOB using RW with extended time and some laboerd steps to recliner. Pt able to stand from recliner with more min G assist using RW.     Balance Overall balance assessment: Needs assistance Sitting-balance support: Feet supported, Bilateral upper extremity supported Sitting balance-Leahy Scale: Fair Sitting balance - Comments: fair to good seated at EOB   Standing balance support: During functional activity, Bilateral upper extremity supported, Reliant on assistive device for balance Standing balance-Leahy Scale: Poor Standing balance comment: poor to fair with RW                           ADL either performed or assessed with clinical judgement   ADL Overall ADL's : Needs assistance/impaired                     Lower Body Dressing: Min guard;Sitting/lateral leans Lower Body Dressing Details (indicate cue type and reason): Pt able to adjust R sock without physical assist seated at EOB indicating pt likely could doff  socks without assist.                      Cognition Arousal/Alertness: Awake/alert Behavior During Therapy: WFL for tasks assessed/performed Overall Cognitive Status: Within Functional Limits for tasks assessed                                                              Pertinent Vitals/ Pain       Pain Assessment Faces Pain Scale: Hurts a little bit Pain Location: Bilateral thighs. Pain Descriptors / Indicators: Other (Comment) ("a little pain") Pain Intervention(s): Monitored during session, Repositioned                                                          Frequency  Min 1X/week        Progress Toward Goals  OT Goals(current goals can now be found in the care plan section)  Progress towards OT goals: Progressing toward goals  Acute Rehab OT Goals Patient Stated Goal: To get back to prior level of independence OT Goal Formulation: With patient Time For Goal Achievement: 06/29/22 Potential to Achieve Goals: Fair ADL Goals Pt Will Perform Grooming: with modified independence;sitting Pt Will Perform Upper Body Bathing: with modified independence;sitting Pt Will Perform Lower Body Bathing: with mod assist;with adaptive equipment;sitting/lateral leans;sit to/from stand Pt Will Perform Upper Body Dressing: with modified independence;sitting Pt Will Perform Lower Body Dressing: with mod assist;with adaptive equipment;sitting/lateral leans;sit to/from stand Pt Will Transfer to Toilet: with mod assist;stand pivot transfer  Plan Discharge plan remains appropriate                                    End of Session Equipment Utilized During Treatment: Gait belt;Rolling walker (2 wheels)  OT Visit Diagnosis: Unsteadiness on feet (R26.81);Other abnormalities of gait and mobility (R26.89);Muscle weakness (generalized) (M62.81)   Activity Tolerance Patient tolerated treatment well   Patient Left in chair;with call bell/phone within reach;with family/visitor present   Nurse Communication Mobility status        Time: 6808-8110 OT Time Calculation (min): 18 min  Charges: OT General Charges $OT Visit: 1 Visit OT Treatments $Self Care/Home Management : 8-22 mins  Zada Haser OT,  MOT   Danie Chandler 06/17/2022, 3:18 PM

## 2022-06-17 NOTE — Progress Notes (Signed)
Pt able to rest well in bed this noc.  Pain controlled with prn oxycodone.  Bisacodyl given with bedtime meds for constipation.  Purewick in place.

## 2022-06-18 ENCOUNTER — Telehealth (HOSPITAL_COMMUNITY): Payer: Medicare Other | Admitting: Psychiatry

## 2022-06-18 NOTE — TOC Transition Note (Addendum)
Transition of Care [facility]) - CM/SW Discharge Note   Patient Details  Name: Pamela Roman MRN: [id number] Date of Birth: Jan 17, 1956  [facility] CM/SW Contact:  Elliot Gault, LCSW Phone Number: 06/18/2022, 1:25 PM   Clinical Narrative:     Pt medically stable for dc per MD. Updated Roanna Epley at [facility] and they can accept pt today.   DC clinical sent electronically. RN to call report. [facility] will be arranged for transport. Pt signed transportation waiver which was sent to medical records for scanning.  No other TOC needs for dc.  Final next level of care: Skilled Nursing Facility Barriers to Discharge: Barriers Resolved   Patient Goals and CMS Choice Patient states their goals for this hospitalization and ongoing recovery are:: rehab CMS Medicare.gov Compare Post Acute Care list provided to:: Patient Choice offered to / list presented to : Patient  Discharge Placement              Patient chooses bed at: [facility] Patient to be transferred to facility by: [facility] Name of family member notified: pt only Patient and family notified of of transfer: 06/18/22  Discharge Plan and Services In-house Referral: Clinical Social Work   Post Acute Care Choice: Skilled Nursing Facility                               Social Determinants of Health (SDOH) Interventions     Readmission Risk Interventions     No data to display

## 2022-06-18 NOTE — Discharge Summary (Signed)
Physician Discharge Summary   Patient: Pamela Roman MRN: [id number] DOB: 1955-10-29  Admit date:     06/14/2022  Discharge date: 06/18/22  Discharge Physician: Onalee Hua Devian Bartolomei   PCP: Anabel Halon, MD   Recommendations at discharge:   Please follow up with primary care provider within 1-2 weeks  Please repeat BMP and CBC in one week    Hospital Course: 66 y/o female with longstanding MS and followed by Dr. Everlena Cooper with [facility] since relocating from [place] presented to ED today with progressive weakness in lower extremities.  She is noted to have progressive multiple sclerosis, migraines, bowel and bladder dysfunction, chronic neuropathic pain, pseudo exacerbation related to heat and overexertion, vitamin D deficiency, hypothyroidism, hyperlipidemia, multiple falls.  She has degeneration of the lumbar disks, chronic low back pain, osteoporosis, pharyngeal esophageal dysphagia, spinal stenosis and thoracic compression fracture.  She reports that her baseline is that she is able to ambulate with the use of her motorized wheelchair with the assistance of her sister whom she lives with and she manages most of her ADLs.  She reports that she has been treated with Betaseron; Avonex; Tysabri from around 2011 until September 2020 due to positive JC virus antibody.  She says she had an 11-month period of taking no medication after that.  Patient reports that she has been taking Mayzent for maintenance of MS since July 2021.  She reportedly had a severe exacerbation of her MS several months ago requiring a high-dose prednisone taper and it was associated with a suicide attempt that she believes was related to the MS flare.  Patient has known chronic debilitating depression of chronic illness and takes antidepressants.    After completing the prednisone taper for the MS flare she felt like she was back to baseline.  Unfortunately approximately 1 week ago she had an episode where she had severe lower  extremity weakness but it only lasted 1 day.  When she woke up the next morning she felt back to her baseline.  She reported she felt well for several days and then yesterday she became very weak in her lower extremities again but this was more severe than the prior episode.  She went to bed but when she woke up her symptoms had not resolved like they had before.  She was not able to get to the bathroom because she was so severely weak and in her legs.  She lost strength in her ankles and lower legs.   She reports intermittent bowel and bladder incontinence but this is not new.  Her main symptom to this as she has not been strong enough to lift her body onto a seat or chair to use the bathroom.  She reports that her vision and her hearing continues to deteriorate.  She was evaluated in the ED by Nehemiah Settle small who had a consultation with neurologist Dr. Selina Cooley who recommended that patient be admitted to [facility] for MRI brain, cervical and thoracic spine with and without contrast.      Assessment and Plan: Progressive Multiple Sclerosis Exacerbation - acute MS flare confirmed by imaging  - discussed with Dr. Amada Jupiter 12/11, ok to keep patient at [facility] and will do teleneurology consult  - MRI brain, Cspine, Tspine with and without contrast for 12/11 at [facility] with patchy areas of enhancement T5-T6 with extensive demyelinating lesions throughout T-cord - neuro started on IV solumedrol 1 gm daily x 3 doses  - neurology consultation appreciated -strength improving after IV steroids  Progressive bilateral LE weakness  - related to MS flare - obtain PT evaluation >>SNF - MRI studies ordered as above  -her leg weakness continued to improve to the point where she was able to ambulate with some assistance   Physical deconditioning/Frequent Falls - PT eval recommending SNF - fall precautions recommended   Major Depressive Disorder - pt reports suicide attempt with last MS flare - continue home  antidepressants  - pt denies suicidal thoughts or plans and contracted for safety   Hypothyroidism  - resume home levothyroxine   Pharyngoesophageal dysphagia  - chronic - dysphagia 3 diet ordered - aspiration precautions  - elevate head of bed    Chronic back pain / DDD  - PDMP reviewed--she is not on any chronic opioids       Consultants: Neurology Procedures performed: None Disposition: Skilled nursing facility Diet recommendation:  Dysphagia type 3 thin Liquid DISCHARGE MEDICATION: Allergies as of 06/18/2022       Reactions   Codeine Nausea And Vomiting        Medication List     STOP taking these medications    predniSONE 50 MG tablet Commonly known as: DELTASONE       TAKE these medications    alendronate 70 MG tablet Commonly known as: FOSAMAX TAKE 1 TABLET ONCE A WEEK WITH A FULL GLASS OF WATER ON AN EMPTY STOMACH What changed: See the new instructions.   BETA CAROTENE PO Take 1 capsule by mouth in the morning. Vitamin A, unknown strength.   levothyroxine 50 MCG tablet Commonly known as: SYNTHROID TAKE 1 TABLET DAILY BEFORE BREAKFAST What changed: additional instructions   Mayzent 1 MG Tabs Generic drug: Siponimod Fumarate TAKE 1 TABLET (1 MG) DAILY What changed:  how much to take how to take this when to take this additional instructions   One-A-Day Womens 50 Plus Tabs Take 1 tablet by mouth in the morning.   sertraline 25 MG tablet Commonly known as: ZOLOFT TAKE 1 TABLET DAILY   simvastatin 20 MG tablet Commonly known as: ZOCOR TAKE 1 TABLET DAILY What changed: when to take this   SUMAtriptan 100 MG tablet Commonly known as: IMITREX TAKE 1 TABLET EVERY 2 HOURS AS NEEDED FOR MIGRAINE. MAY REPEAT IN 2 HOURS IF HEADACHE PERSISTS OR RECURS What changed: See the new instructions.   Vitamin D (Ergocalciferol) 1.25 MG (50000 UNIT) Caps capsule Commonly known as: DRISDOL TAKE 1 CAPSULE EVERY 7 DAYS What changed: See the new  instructions.   zonisamide 100 MG capsule Commonly known as: ZONEGRAN Take 1 capsule (100 mg total) by mouth daily. What changed: when to take this        Contact information for after-discharge care     Destination     [facility] Preferred SNF .   Service: Skilled Nursing Contact information: [address] [place] [address] [phone]                    Discharge Exam: Ceasar Mons Weights   06/14/22 0905  Weight: 57.2 kg   HEENT:  [place]/AT, No thrush, no icterus CV:  RRR, no rub, no S3, no S4 Lung:  CTA, no wheeze, no rhonchi Abd:  soft/+BS, NT Ext:  No edema, no lymphangitis, no synovitis, no rash   Condition at discharge: stable  The results of significant diagnostics from this hospitalization (including imaging, microbiology, ancillary and laboratory) are listed below for reference.   Imaging Studies: MR Brain  W and Wo Contrast  Result Date: 06/15/2022 CLINICAL DATA:  MS EXAM: MRI HEAD WITHOUT AND WITH CONTRAST MRI CERVICAL SPINE WITHOUT AND WITH CONTRAST MRI CERVICAL THORACIC WITHOUT AND CONTRAST CONTRAST:  32mL GADAVIST GADOBUTROL 1 MMOL/ML IV SOLN TECHNIQUE: Multiplanar, multiecho pulse sequences of the brain and surrounding structures, and cervical and thoracic spine were obtained without and with intravenous contrast. COMPARISON:  12/31/21 MRI Brain, Cervical Spine, and Thoracic SPine FINDINGS: MRI HEAD FINDINGS Brain: No acute infarction, hemorrhage, hydrocephalus, extra-axial collection. Redemonstrated are extensive periventricular and juxtacortical T2/FLAIR hyperintense lesions compatible with the patient's history of demyelinating disease. No new lesions are visualized. No evidence of diffusion restriction or contrast enhancement to suggest active demyelination. There are several T1 black holes in the bilateral cerebral hemispheres, suggestive chronic volume loss . Vascular: Normal flow voids. Skull and  upper cervical spine: Normal marrow signal. Sinuses/Orbits: Bilateral lens replacement. Mild bilateral ethmoid sinus mucosal thickening. Other: None MRI CERVICAL SPINE FINDINGS Limitations: Assessment is slightly limited due to the presence of motion artifact. Alignment: There is trace retrolisthesis of C2 on C3 and C3 on C4. Vertebrae: No fracture, evidence of discitis, or bone lesion. Cord: Redemonstrated are findings of extensive demyelinating lesions in the cervical spinal cord. No definite contrast-enhancing lesions are visualized to suggest active demyelination. T2 hyperintense lesions are seen at the: Craniocervical junction (series 3, image 7), at the C2-C3 level (series 3, image 7), C3-C4 level (series 4, image 10), C7-T1 level (series 4, image 25). There is chronic volume loss of the C7-T1 level, unchanged from prior exam. Posterior Fossa, vertebral arteries, paraspinal tissues: Negative. Disc levels: No evidence of high-grade spinal canal stenosis. MRI THORACIC SPINE FINDINGS Limitations: Assessment for the presence of new lesions is limtied due to motion artifact. Alignment: There is grade 1 anterolisthesis of T9 on T10. Is grade 1 anterolisthesis of T12 on L1. Mild retrolisthesis of L2 on L3 and L3 on L4, unchanged from prior exam. Vertebrae: Compared to prior exam there is a new superior endplate compression deformity of the T10 level. Post kyphoplasty changes at T12 and L1. Unchanged pincer type compression deformity at the L1 level. Unchanged chronic compression deformities at T5 and T6. Unchanged chronic superior endplate compression deformity at T3. There is likely osseous hemangioma at T2. Cord: There are multiple demyelinating lesions throughout the thoracic spinal cord with near confluence T2 hyperintense signal in the upper thoracic spinal cord extending to the T11 level. Compared to prior exam a new more discrete focal lesion is seen at the T12-L1 level (series 22, image 34-36). There may be a  focal region of contrast enhancement at the T5-T6 level (series 25, image 13). Paraspinal and other soft tissues: Negative. Disc levels: There is mild spinal canal narrowing at the L1 level secondary to retropulsion of the fracture fragment, unchanged from prior. IMPRESSION: 1. Findings consistent with the patient's history of multiple sclerosis. No new lesions are visualized in the brain or cervical spine. No evidence of active demyelination in the cervical spine. 2. Extensive demyelinating lesions throughout the thoracic spinal cord, with a new demyelinating lesion seen at the T12-L1 level. There may be a focal region of contrast enhancement at the T5-T6 level, which could suggest suggesting active demyelination. 3. New acute to subacute superior endplate compression deformity at the T10 level. Electronically Signed   By: Lorenza Cambridge M.D.   On: 06/15/2022 09:44   MR Cervical Spine W or Wo Contrast  Result Date: 06/15/2022 CLINICAL DATA:  MS EXAM: MRI HEAD  WITHOUT AND WITH CONTRAST MRI CERVICAL SPINE WITHOUT AND WITH CONTRAST MRI CERVICAL THORACIC WITHOUT AND CONTRAST CONTRAST:  20mL GADAVIST GADOBUTROL 1 MMOL/ML IV SOLN TECHNIQUE: Multiplanar, multiecho pulse sequences of the brain and surrounding structures, and cervical and thoracic spine were obtained without and with intravenous contrast. COMPARISON:  12/31/21 MRI Brain, Cervical Spine, and Thoracic SPine FINDINGS: MRI HEAD FINDINGS Brain: No acute infarction, hemorrhage, hydrocephalus, extra-axial collection. Redemonstrated are extensive periventricular and juxtacortical T2/FLAIR hyperintense lesions compatible with the patient's history of demyelinating disease. No new lesions are visualized. No evidence of diffusion restriction or contrast enhancement to suggest active demyelination. There are several T1 black holes in the bilateral cerebral hemispheres, suggestive chronic volume loss . Vascular: Normal flow voids. Skull and upper cervical spine:  Normal marrow signal. Sinuses/Orbits: Bilateral lens replacement. Mild bilateral ethmoid sinus mucosal thickening. Other: None MRI CERVICAL SPINE FINDINGS Limitations: Assessment is slightly limited due to the presence of motion artifact. Alignment: There is trace retrolisthesis of C2 on C3 and C3 on C4. Vertebrae: No fracture, evidence of discitis, or bone lesion. Cord: Redemonstrated are findings of extensive demyelinating lesions in the cervical spinal cord. No definite contrast-enhancing lesions are visualized to suggest active demyelination. T2 hyperintense lesions are seen at the: Craniocervical junction (series 3, image 7), at the C2-C3 level (series 3, image 7), C3-C4 level (series 4, image 10), C7-T1 level (series 4, image 25). There is chronic volume loss of the C7-T1 level, unchanged from prior exam. Posterior Fossa, vertebral arteries, paraspinal tissues: Negative. Disc levels: No evidence of high-grade spinal canal stenosis. MRI THORACIC SPINE FINDINGS Limitations: Assessment for the presence of new lesions is limtied due to motion artifact. Alignment: There is grade 1 anterolisthesis of T9 on T10. Is grade 1 anterolisthesis of T12 on L1. Mild retrolisthesis of L2 on L3 and L3 on L4, unchanged from prior exam. Vertebrae: Compared to prior exam there is a new superior endplate compression deformity of the T10 level. Post kyphoplasty changes at T12 and L1. Unchanged pincer type compression deformity at the L1 level. Unchanged chronic compression deformities at T5 and T6. Unchanged chronic superior endplate compression deformity at T3. There is likely osseous hemangioma at T2. Cord: There are multiple demyelinating lesions throughout the thoracic spinal cord with near confluence T2 hyperintense signal in the upper thoracic spinal cord extending to the T11 level. Compared to prior exam a new more discrete focal lesion is seen at the T12-L1 level (series 22, image 34-36). There may be a focal region of  contrast enhancement at the T5-T6 level (series 25, image 13). Paraspinal and other soft tissues: Negative. Disc levels: There is mild spinal canal narrowing at the L1 level secondary to retropulsion of the fracture fragment, unchanged from prior. IMPRESSION: 1. Findings consistent with the patient's history of multiple sclerosis. No new lesions are visualized in the brain or cervical spine. No evidence of active demyelination in the cervical spine. 2. Extensive demyelinating lesions throughout the thoracic spinal cord, with a new demyelinating lesion seen at the T12-L1 level. There may be a focal region of contrast enhancement at the T5-T6 level, which could suggest suggesting active demyelination. 3. New acute to subacute superior endplate compression deformity at the T10 level. Electronically Signed   By: Lorenza Cambridge M.D.   On: 06/15/2022 09:44   MR THORACIC SPINE W WO CONTRAST  Result Date: 06/15/2022 CLINICAL DATA:  MS EXAM: MRI HEAD WITHOUT AND WITH CONTRAST MRI CERVICAL SPINE WITHOUT AND WITH CONTRAST MRI CERVICAL THORACIC WITHOUT AND  CONTRAST CONTRAST:  18mL GADAVIST GADOBUTROL 1 MMOL/ML IV SOLN TECHNIQUE: Multiplanar, multiecho pulse sequences of the brain and surrounding structures, and cervical and thoracic spine were obtained without and with intravenous contrast. COMPARISON:  12/31/21 MRI Brain, Cervical Spine, and Thoracic SPine FINDINGS: MRI HEAD FINDINGS Brain: No acute infarction, hemorrhage, hydrocephalus, extra-axial collection. Redemonstrated are extensive periventricular and juxtacortical T2/FLAIR hyperintense lesions compatible with the patient's history of demyelinating disease. No new lesions are visualized. No evidence of diffusion restriction or contrast enhancement to suggest active demyelination. There are several T1 black holes in the bilateral cerebral hemispheres, suggestive chronic volume loss . Vascular: Normal flow voids. Skull and upper cervical spine: Normal marrow signal.  Sinuses/Orbits: Bilateral lens replacement. Mild bilateral ethmoid sinus mucosal thickening. Other: None MRI CERVICAL SPINE FINDINGS Limitations: Assessment is slightly limited due to the presence of motion artifact. Alignment: There is trace retrolisthesis of C2 on C3 and C3 on C4. Vertebrae: No fracture, evidence of discitis, or bone lesion. Cord: Redemonstrated are findings of extensive demyelinating lesions in the cervical spinal cord. No definite contrast-enhancing lesions are visualized to suggest active demyelination. T2 hyperintense lesions are seen at the: Craniocervical junction (series 3, image 7), at the C2-C3 level (series 3, image 7), C3-C4 level (series 4, image 10), C7-T1 level (series 4, image 25). There is chronic volume loss of the C7-T1 level, unchanged from prior exam. Posterior Fossa, vertebral arteries, paraspinal tissues: Negative. Disc levels: No evidence of high-grade spinal canal stenosis. MRI THORACIC SPINE FINDINGS Limitations: Assessment for the presence of new lesions is limtied due to motion artifact. Alignment: There is grade 1 anterolisthesis of T9 on T10. Is grade 1 anterolisthesis of T12 on L1. Mild retrolisthesis of L2 on L3 and L3 on L4, unchanged from prior exam. Vertebrae: Compared to prior exam there is a new superior endplate compression deformity of the T10 level. Post kyphoplasty changes at T12 and L1. Unchanged pincer type compression deformity at the L1 level. Unchanged chronic compression deformities at T5 and T6. Unchanged chronic superior endplate compression deformity at T3. There is likely osseous hemangioma at T2. Cord: There are multiple demyelinating lesions throughout the thoracic spinal cord with near confluence T2 hyperintense signal in the upper thoracic spinal cord extending to the T11 level. Compared to prior exam a new more discrete focal lesion is seen at the T12-L1 level (series 22, image 34-36). There may be a focal region of contrast enhancement at the  T5-T6 level (series 25, image 13). Paraspinal and other soft tissues: Negative. Disc levels: There is mild spinal canal narrowing at the L1 level secondary to retropulsion of the fracture fragment, unchanged from prior. IMPRESSION: 1. Findings consistent with the patient's history of multiple sclerosis. No new lesions are visualized in the brain or cervical spine. No evidence of active demyelination in the cervical spine. 2. Extensive demyelinating lesions throughout the thoracic spinal cord, with a new demyelinating lesion seen at the T12-L1 level. There may be a focal region of contrast enhancement at the T5-T6 level, which could suggest suggesting active demyelination. 3. New acute to subacute superior endplate compression deformity at the T10 level. Electronically Signed   By: Lorenza Cambridge M.D.   On: 06/15/2022 09:44   DG Hip Unilat W or Wo Pelvis 2-3 Views Right  Result Date: 06/14/2022 CLINICAL DATA:  Bilateral leg weakness. EXAM: DG HIP (WITH OR WITHOUT PELVIS) 2-3V RIGHT COMPARISON:  None Available. FINDINGS: There is no evidence of hip fracture or dislocation. Degenerative disc disease noted within the  imaged portions of the lumbar spine. Moderate retained stool identified within the rectum. Calcified uterine fibroid noted in the pelvis. IMPRESSION: 1. No acute findings. 2. Calcified uterine fibroid. 3. Moderate retained stool within the rectum. Electronically Signed   By: Signa Kell M.D.   On: 06/14/2022 10:20   CT Lumbar Spine Wo Contrast  Result Date: 06/14/2022 CLINICAL DATA:  Colles weakness. New incontinence. Patient has a history of a mass. EXAM: CT LUMBAR SPINE WITHOUT CONTRAST TECHNIQUE: Multidetector CT imaging of the lumbar spine was performed without intravenous contrast administration. Multiplanar CT image reconstructions were also generated. RADIATION DOSE REDUCTION: This exam was performed according to the departmental dose-optimization program which includes automated exposure  control, adjustment of the mA and/or kV according to patient size and/or use of iterative reconstruction technique. COMPARISON:  Thoracic spine MRI dated 12/31/2021. FINDINGS: Segmentation: 5 lumbar type vertebrae. Alignment: Slight retrolisthesis of L2 on L3. Mild levoscoliosis, apex at L2-L3. Vertebrae: Chronic fractures of T11, T12, L1 and L2, T12 and L1 previously treated with vertebroplasty. These findings are stable from the prior thoracic MRI. No other fractures.  No bone lesions. Paraspinal and other soft tissues: No soft tissue mass. No spinal canal mass or fluid collection. Skin aortic atherosclerotic calcifications. Disc levels: T12-L1: Posterior aspect of the fractured L1 vertebra mildly indents the ventral thecal sac, without significant spinal canal narrowing. Neural foramina are well preserved. No disc bulging or disc herniation. L1-L2: No disc bulging. No evidence of a disc herniation. Central spinal canal and neural foramina are well preserved. L2-L3: Lower endplate of the fractured L2 vertebra mildly indents the ventral thecal sac without significant spinal canal narrowing. Moderate bilateral neural foraminal narrowing due to a combination of the prominent lower endplate and facet degenerative changes and the mild degenerative retrolisthesis. No convincing disc herniation. L3-L4: Minor disc bulging. No disc herniation. No significant stenosis. L4-L5: Mild diffuse disc bulging with endplate spurring. Mild facet degenerative change. Mild narrowing of the central spinal canal. Lateral recesses relatively well preserved. Moderate bilateral neural foraminal narrowing. No disc herniation. L5-S1: Minor disc bulging. No disc herniation. No significant stenosis. IMPRESSION: 1. No acute fracture or acute finding. 2. Chronic fractures of T11, T12, L1 and L2. 3. Degenerative changes as detailed. No disc herniation. Mild central spinal canal narrowing at L4-L5. No findings, however, to account for new  incontinence. Electronically Signed   By: Amie Portland M.D.   On: 06/14/2022 10:00   CT Head Wo Contrast  Result Date: 06/14/2022 CLINICAL DATA:  History of multiple sclerosis. Worsening bilateral lower extremity weakness. EXAM: CT HEAD WITHOUT CONTRAST TECHNIQUE: Contiguous axial images were obtained from the base of the skull through the vertex without intravenous contrast. RADIATION DOSE REDUCTION: This exam was performed according to the departmental dose-optimization program which includes automated exposure control, adjustment of the mA and/or kV according to patient size and/or use of iterative reconstruction technique. COMPARISON:  Brain MRI, 12/31/2021. FINDINGS: Brain: No evidence of acute infarction, hemorrhage, hydrocephalus, extra-axial collection or mass lesion/mass effect. Bilateral patchy areas of periventricular white matter hypoattenuation consistent with MS plaques and without significant change from the prior brain MRI. Vascular: No hyperdense vessel or unexpected calcification. Skull: Normal. Negative for fracture or focal lesion. Sinuses/Orbits: Globes and orbits are unremarkable. Mild scattered ethmoid sinus mucosal thickening. Other: None. IMPRESSION: 1. No acute intracranial abnormalities. 2. White matter changes consistent with the history of MS, and without significant change from the brain MRI dated 12/31/2021. Electronically Signed   By: Onalee Hua  Ormond M.D.   On: 06/14/2022 09:53    Microbiology: Results for orders placed or performed during the hospital encounter of 02/10/22  SARS Coronavirus 2 by RT PCR (hospital order, performed in [facility] hospital lab) *cepheid single result test* Anterior Nasal Swab     Status: None   Collection Time: 02/11/22  1:32 AM   Specimen: Anterior Nasal Swab  Result Value Ref Range Status   SARS Coronavirus 2 by RT PCR NEGATIVE NEGATIVE Final    Comment: (NOTE) SARS-CoV-2 target nucleic acids are NOT DETECTED.  The SARS-CoV-2 RNA is  generally detectable in upper and lower respiratory specimens during the acute phase of infection. The lowest concentration of SARS-CoV-2 viral copies this assay can detect is 250 copies / mL. A negative result does not preclude SARS-CoV-2 infection and should not be used as the sole basis for treatment or other patient management decisions.  A negative result may occur with improper specimen collection / handling, submission of specimen other than nasopharyngeal swab, presence of viral mutation(s) within the areas targeted by this assay, and inadequate number of viral copies (<250 copies / mL). A negative result must be combined with clinical observations, patient history, and epidemiological information.  Fact Sheet for Patients:   RoadLapTop.co.za  Fact Sheet for Healthcare Providers: http://kim-miller.com/  This test is not yet approved or  cleared by the [place] FDA and has been authorized for detection and/or diagnosis of SARS-CoV-2 by FDA under an Emergency Use Authorization (EUA).  This EUA will remain in effect (meaning this test can be used) for the duration of the COVID-19 declaration under Section 564(b)(1) of the Act, 21 U.S.C. section 360bbb-3(b)(1), unless the authorization is terminated or revoked sooner.  Performed at [facility], [address] [place], [place] [address]     Labs: CBC: Recent Labs  Lab 06/14/22 0948 06/14/22 1458  WBC 5.3 4.7  NEUTROABS 4.4  --   HGB 13.2 13.3  HCT 40.6 40.7  MCV 95.1 94.0  PLT 197 200   Basic Metabolic Panel: Recent Labs  Lab 06/14/22 0948 06/14/22 1458 06/15/22 0516  NA 139  --  137  K 4.1  --  3.6  CL 109  --  107  CO2 23  --  19*  GLUCOSE 122*  --  126*  BUN 16  --  18  CREATININE 0.74 0.72 0.75  CALCIUM 8.9  --  8.5*  MG  --   --  1.9   Liver Function Tests: Recent Labs  Lab 06/14/22 0948  AST 28  ALT 22  ALKPHOS 104  BILITOT 0.6  PROT 6.8   ALBUMIN 4.1   CBG: Recent Labs  Lab 06/14/22 0900  GLUCAP 127*    Discharge time spent: greater than 30 minutes.  Signed: Catarina Hartshorn, MD [facility] 06/18/2022

## 2022-06-18 NOTE — Care Management Important Message (Signed)
Important Message  Patient Details  Name: Pamela Roman MRN: [id number] Date of Birth: [birth year]   Medicare Important Message Given:  Yes     Corey Harold 06/18/2022, 2:35 PM

## 2022-06-19 ENCOUNTER — Encounter: Payer: Self-pay | Admitting: Family Medicine

## 2022-06-19 ENCOUNTER — Ambulatory Visit (INDEPENDENT_AMBULATORY_CARE_PROVIDER_SITE_OTHER): Payer: Medicare Other | Admitting: Family Medicine

## 2022-06-19 DIAGNOSIS — U071 COVID-19: Secondary | ICD-10-CM

## 2022-06-19 NOTE — Telephone Encounter (Signed)
Pt called to let Dr Everlena Cooper know that she has been discharged from the hospital and its now at [facility] in [place]. She now is Covid + and in isolation. Once she feels better she will call to schedule appointment for her follow up.

## 2022-06-19 NOTE — Progress Notes (Addendum)
Virtual Visit via Telephone Note   This visit type was conducted via telephone. This format is felt to be most appropriate for this patient at this time.  The patient did not have access to video technology/had technical difficulties with video requiring transitioning to audio format only (telephone).  All issues noted in this document were discussed and addressed.  No physical exam could be performed with this format.  Evaluation Performed:  Follow-up visit  Date:  06/19/2022   ID:  Pamela Roman, DOB [birth year], MRN [id number]  Patient Location: Home Provider Location: Clinic  Participants: Patient Location of Patient: Home Location of Provider: Clinic Consent was obtain for visit to be over via telehealth. I verified that I am speaking with the correct person using two identifiers.  PCP:  Anabel Halon, MD   Chief Complaint: COVID-19 positive  History of Present Illness:    Pamela Roman is a 66 y.o. female who reports that she tested positive for COVID after checking in to her rehab facility on 06/18/2022.  Patient is asymptomatic and reports feeling well.  She reports that she was admitted to the hospital for 4 days and probably had an exposure while in the emergency department.      Past Medical, Surgical, Social History, Allergies, and Medications have been Reviewed.  Past Medical History:  Diagnosis Date   Depression    Phreesia 08/06/2020   Hx of migraines    Hyperglycemia    Hypothyroidism    Migraines    Multiple sclerosis (HCC)    Past Surgical History:  Procedure Laterality Date   CATARACT EXTRACTION W/PHACO Left 01/01/2020   Procedure: CATARACT EXTRACTION PHACO AND INTRAOCULAR LENS PLACEMENT LEFT EYE;  Surgeon: Fabio Pierce, MD;  Location: [facility];  Service: Ophthalmology;  Laterality: Left;  CDE: 3.37   CATARACT EXTRACTION W/PHACO Right 01/19/2020   Procedure: CATARACT EXTRACTION PHACO AND INTRAOCULAR LENS PLACEMENT RIGHT EYE;  Surgeon: Fabio Pierce, MD;  Location: [facility];  Service: Ophthalmology;  Laterality: Right;  CDE: 3.16   TONSILLECTOMY       Current Meds  Medication Sig   alendronate (FOSAMAX) 70 MG tablet TAKE 1 TABLET ONCE A WEEK WITH A FULL GLASS OF WATER ON AN EMPTY STOMACH (Patient taking differently: Take 70 mg by mouth every Sunday.)   BETA CAROTENE PO Take 1 capsule by mouth in the morning. Vitamin A, unknown strength.   levothyroxine (SYNTHROID) 50 MCG tablet TAKE 1 TABLET DAILY BEFORE BREAKFAST (Patient taking differently: Take 50 mcg by mouth daily before breakfast. Brand name Synthroid.)   Multiple Vitamins-Minerals (ONE-A-DAY WOMENS 50 PLUS) TABS Take 1 tablet by mouth in the morning.   sertraline (ZOLOFT) 25 MG tablet TAKE 1 TABLET DAILY (Patient taking differently: Take 25 mg by mouth in the morning.)   simvastatin (ZOCOR) 20 MG tablet TAKE 1 TABLET DAILY (Patient taking differently: Take 20 mg by mouth in the morning.)   Siponimod Fumarate (MAYZENT) 1 MG TABS TAKE 1 TABLET (1 MG) DAILY (Patient taking differently: Take 1 mg by mouth at bedtime.)   SUMAtriptan (IMITREX) 100 MG tablet TAKE 1 TABLET EVERY 2 HOURS AS NEEDED FOR MIGRAINE. MAY REPEAT IN 2 HOURS IF HEADACHE PERSISTS OR RECURS (Patient taking differently: Take 100 mg by mouth See admin instructions. 100 mg at the onset of migraine. May repeat 100 mg in 2 hours if headache persists or recurs.)   Vitamin D, Ergocalciferol, (DRISDOL) 1.25 MG (50000 UNIT) CAPS capsule TAKE 1 CAPSULE EVERY  7 DAYS (Patient taking differently: Take 50,000 Units by mouth every Sunday.)   zonisamide (ZONEGRAN) 100 MG capsule Take 1 capsule (100 mg total) by mouth daily. (Patient taking differently: Take 100 mg by mouth in the morning.)     Allergies:   Codeine   ROS:   Please see the history of present illness.     All other systems reviewed and are negative.   Labs/Other Tests and Data Reviewed:    Recent Labs: 07/16/2021: TSH 2.230 06/14/2022: ALT 22; Hemoglobin 13.3;  Platelets 200 06/15/2022: BUN 18; Creatinine, Ser 0.75; Magnesium 1.9; Potassium 3.6; Sodium 137   Recent Lipid Panel Lab Results  Component Value Date/Time   CHOL 200 (H) 11/06/2020 09:38 AM   TRIG 129 11/06/2020 09:38 AM   HDL 54 11/06/2020 09:38 AM   CHOLHDL 3.7 11/06/2020 09:38 AM   LDLCALC 123 (H) 11/06/2020 09:38 AM    Wt Readings from Last 3 Encounters:  06/14/22 126 lb 1.7 oz (57.2 kg)  02/25/22 126 lb 3.2 oz (57.2 kg)  02/23/22 128 lb (58.1 kg)     Objective:    Vital Signs:  There were no vitals taken for this visit.     ASSESSMENT & PLAN:   COVID-19 positive We will treat with Paxlovid given the patient it is at high risk of becoming seriously ill from the virus Encouraged to start taking simvastatin 12 hours prior to taking Paxlovid Patient reports that she will call to provide the name of the pharmacy at the heart rehab facility as she is now at home currently Will send the orders once the patient call with the right pharmacy    Time:   Today, I have spent 12 minutes reviewing the chart, including problem list, medications, and with the patient with telehealth technology discussing the above problems.   Medication Adjustments/Labs and Tests Ordered: Current medicines are reviewed at length with the patient today.  Concerns regarding medicines are outlined above.   Tests Ordered: No orders of the defined types were placed in this encounter.   Medication Changes: No orders of the defined types were placed in this encounter.    Note: This dictation was prepared with Dragon dictation along with smaller phrase technology. Similar sounding words can be transcribed inadequately or may not be corrected upon review. Any transcriptional errors that result from this process are unintentional.      Disposition:  Follow up  Signed, Gilmore Laroche, FNP  06/19/2022 2:26 PM     [facility]

## 2022-07-08 ENCOUNTER — Telehealth: Payer: Self-pay | Admitting: Internal Medicine

## 2022-07-08 NOTE — Telephone Encounter (Signed)
Left message for mark

## 2022-07-08 NOTE — Telephone Encounter (Signed)
Mark from [facility] called to notify provider that patient fell out of bed 01.02.2024 and in no pain, no trip to the ER. Please return [employer] at [phone].

## 2022-07-16 ENCOUNTER — Ambulatory Visit (INDEPENDENT_AMBULATORY_CARE_PROVIDER_SITE_OTHER): Payer: Medicare Other | Admitting: Internal Medicine

## 2022-07-16 ENCOUNTER — Encounter: Payer: Self-pay | Admitting: Internal Medicine

## 2022-07-16 VITALS — BP 123/81 | HR 75 | Ht 63.0 in | Wt 124.6 lb

## 2022-07-16 DIAGNOSIS — R296 Repeated falls: Secondary | ICD-10-CM

## 2022-07-16 DIAGNOSIS — G35 Multiple sclerosis: Secondary | ICD-10-CM

## 2022-07-16 DIAGNOSIS — E039 Hypothyroidism, unspecified: Secondary | ICD-10-CM

## 2022-07-16 DIAGNOSIS — E559 Vitamin D deficiency, unspecified: Secondary | ICD-10-CM

## 2022-07-16 DIAGNOSIS — E782 Mixed hyperlipidemia: Secondary | ICD-10-CM

## 2022-07-16 DIAGNOSIS — R7303 Prediabetes: Secondary | ICD-10-CM

## 2022-07-16 DIAGNOSIS — F331 Major depressive disorder, recurrent, moderate: Secondary | ICD-10-CM

## 2022-07-16 NOTE — Progress Notes (Signed)
NEUROLOGY FOLLOW UP OFFICE NOTE  Pamela Roman [id number]  Assessment/Plan:   Multiple sclerosis - Pamela Roman has had more than one flare up this past year.  Would change DMT. Migraine without aura, without status migrainosus, not intractable       1.  DMT:  After discussion, plan to switch to [facility].  - check VZV abs, Quantiferon-TB Gold, HIV, hepatitis panel  - Check CBC with diff 2 months after initiating medication. 2.  Migraine prevention:  zonisamide 100mg  QD 3.  Migraine rescue:  Sumatriptan 100mg  4.  D3 5000 IU daily   6.  Follow up in 6 months.   Subjective:  Pamela Roman is a 67 year old left-handed white female who follows up for multiple sclerosis and assessment for power chair.  Pamela Roman is accompanied by her sister who supplements history.   UPDATE: Current DMT:  Mayzent 1mg  daily (since July 2021). Other current medications:  Sertraline 25mg  daily; sumatriptan 100mg  PRN; D 5000 IU daily; levothyroxine; Aleve BID for pain, zonisamide 100mg  QD  In September, Pamela Roman reported worsening bilateral leg weakness.  Home Solu-Medrol infusions were too expensive, so Pamela Roman received high-dose prednisone that seemed to work and was back to baseline.  On 12/4, her legs became very weak again and Pamela Roman couldn't move them. However, weakness resolved overnight.  The weakness returned on 12/9, for which Pamela Roman was admitted to [facility].  MRI of brain/cervical/thoracic with and without contrast on 12/11 revealed no new lesions in brain and cervical spine but did demonstrate new lesion at T12-L1 and enhancing lesion at T5-T6.  Pamela Roman received 3 day course of 1gm IV Solu-Medrol and discharged to SNF.   07/16/2022 LABS:  CBC with WBC 4.1, HGB 13.8, HCT 41.8, PLT 260, ALC 0.4; CMP with Na 141, K 4.4, Cl 105, CO2 22, glucose 92, BUN 14, Cr 0.71, AP 128, t bili 0.5, AST 40, ALT 41.   Vision:  If Pamela Roman looks at distance, Pamela Roman sees horizontal double vision.  Trouble writing and reading.  Pamela Roman had cataract surgery  in 2021 and it didn't improve.  Likely related to INO. Motor: Significant bilateral lower extremity weakness Headaches: Severe pressure-like bi-temporal/retro-orbital pain with nausea.  Responds to sumatriptan.  Increased frequency as above.   Gait:  wheelchair bound.   Bowel/Bladder:  Reports both bowel and bladder incontinence.  Fatigue:  yes Cognition:  Word finding issues.   Mood:  Worsening depression.  Seen in the ED at [facility] on 8/8 afater deliberately ingesting the full 90 day bottle of sertraline.  Does not feel suicidal.   Migraines:  On zonisamide.  Now infrequent  Aborts quickly with sumatriptan. On disability.  Pamela Roman does have home PT but reports Pamela Roman otherwise does not have home health care.     HISTORY: Pamela Roman was diagnosed with multiple sclerosis in the early 1990s after experiencing right optic neuritis.  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  Pamela Roman underwent LP and MRIs, which were consistent with MS.  Pamela Roman initially started Betaseron and then Avonex until 2011, after which Pamela Roman was switched to Tysabri.  Within the last 5 years, Pamela Roman has had progression of disease, but particularly worse since moving here in March.  Pamela Roman has fallen 4 times.  Pamela Roman started using a walker about 5 years ago.     Pamela Roman had an MS flare in May 2021, describing increased difficulty walking and bilateral lower extremity numbness and tingling.  For paresthesias, Pamela Roman was started  on gabapentin.  MRI of spinal cord showed acute lesions.  Started Mayzent in July 2021 but patient noted increased paresthesias in the legs.  Pamela Roman was prescribed high-dose prednisone.  Pamela Roman has chronic low back pain with vertebral fractures and osteoporosis.  NCV-EMG of lower extremities on 09/23/2021 showed bilateral chronic L3-4 radiculopathy.  MRI Lumbar spine on 11/15/2020 showed multilevel spondylosis with moderate spinal canal stenosis and severe bilateral lateral recess stenosis at L4-L5 as well as chronic compression  deformities of T11, L1, and L2, including severe right foraminal stenosis at L2-3.   Declined referral to neurosurgery at that time.    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 Pamela Roman then was receiving Solu-Medrol every month. Other past medications: Gabapentin (weakness)   Imaging: 12/31/2021 MRI BRAIN W WO:  1. Findings consistent with widespread chronic demyelinating disease/multiple sclerosis. Overall, disease burden is not significantly changed from previous, with no definite new lesions. No evidence for active demyelination on today's exam. 2. Underlying mildly advanced cerebral atrophy for age. 12/31/2021 MRI C-SPINE W WO:  1. No significant interval change in extensive patchy signal abnormality throughout the cervical spinal cord, consistent with history of chronic multiple sclerosis. No new lesions or significant disease progression. No evidence for active demyelination.  2. New mild chronic compression deformity at the superior endplate of T3.  3. Moderate spondylosis at C5-6 and C6-7 without significant stenosis. 12/31/2021 MRI T-SPINE W WO:  1. Diffuse patchy and hazy signal abnormality involving the majority of the thoracic cord, most pronounced at the level of T6-7. In comparison with prior exam, these changes are likely similar, although prior exam is degraded by motion, consistent with history of chronic demyelinating disease/multiple sclerosis. Overall, appearance is similar to prior, without evidence for significant disease progression. No definite new lesions. No active demyelination. 2. New chronic compression deformities involving the T3, T5, and T6 vertebral bodies. Additional chronic compression fractures at T11 through L2 are stable. 09/16/2020 MRI BRAIN W WO:  stable with resolution of previous small active lesion 09/09/2020 MRI C & T-SPINE W WO:  1. Unchanged  distribution of demyelinating lesions throughout the cervical spinal cord. Motion degraded images of the thoracic spine,but grossly unchanged. 2. No evidence of active demyelination within the cervical or thoracic spine.  04/14/2020 MRI BRAIN W WO:  subcentimeter focus of enhancement within the left posterior medulla, consistent with active demyelination. 12/23/2019 MRI CERVICAL SPINE W WO:  1. Extensive patchy T2 signal abnormality throughout the cervical spinal cord, consistent with demyelinating disease/multiple sclerosis. Faint patchy postcontrast enhancement about a few lesions.  AT C4 AND C7, consistent with active demyelination.  2. Mild multilevel cervical spondylosis without significant spinal stenosis. Mild bilateral C6 foraminal narrowing. 12/23/2019 MRI THORACIC SPINE W WO:  1. Patchy signal abnormality throughout much of the thoracic spinal cord, consistent with demyelinating disease/multiple sclerosis. No evidence for active demyelination within the thoracic spinal cord.  2. Acute to subacute compression fracture extending through the inferior endplate of L2 with up to 40% height loss and 4 mm of bony retropulsion, incompletely assessed on this exam. Finding could be further evaluated with dedicated MRI of the lumbar spine as clinically desired.  3. Additional chronic lower thoracic compression fractures as above. 07/24/2019 MRI BRAIN W WO:  1.  Bilateral periventricular and radial white matter hyperintensities involving the frontal parietal and temporal regions bilaterally stable compared to 04/25/19.  2.  Compared  to prior study the white matter hyperintensities, T1 low signal foci associated with these, and the absence of contrast enhancement appears stable.  3.  No evidence of new plaques or contrast enhancement to suggest new inflammatory foci. 04/25/2019 MRI BRAIN W WO:  CEREBRUM:  Extensive multiple sclerosis plaques are visualized.  These is seen adjacent to the tmporal horns bilaterally  extending to surround the atria and occipital horns bilaterally and extensively throughout the periventricular white matter bilateral.  These also extend into the bifrontal corona radiata and centrum semiovale.  No involvement of brainstem or cerebellum.  PAST MEDICAL HISTORY: Past Medical History:  Diagnosis Date   Depression    Phreesia 08/06/2020   Hx of migraines    Hyperglycemia    Hypothyroidism    Migraines    Multiple sclerosis ([place])     MEDICATIONS: Current Outpatient Medications on File Prior to Visit  Medication Sig Dispense Refill   alendronate (FOSAMAX) 70 MG tablet TAKE 1 TABLET ONCE A WEEK WITH A FULL GLASS OF WATER ON AN EMPTY STOMACH (Patient taking differently: Take 70 mg by mouth every Sunday.) 12 tablet 3   BETA CAROTENE PO Take 1 capsule by mouth in the morning. Vitamin A, unknown strength.     levothyroxine (SYNTHROID) 50 MCG tablet TAKE 1 TABLET DAILY BEFORE BREAKFAST (Patient taking differently: Take 50 mcg by mouth daily before breakfast. Brand name Synthroid.) 90 tablet 3   Multiple Vitamins-Minerals (ONE-A-DAY WOMENS 50 PLUS) TABS Take 1 tablet by mouth in the morning.     sertraline (ZOLOFT) 25 MG tablet TAKE 1 TABLET DAILY (Patient taking differently: Take 25 mg by mouth in the morning.) 15 tablet 10   simvastatin (ZOCOR) 20 MG tablet TAKE 1 TABLET DAILY (Patient taking differently: Take 20 mg by mouth in the morning.) 90 tablet 3   Siponimod Fumarate (MAYZENT) 1 MG TABS TAKE 1 TABLET (1 MG) DAILY (Patient taking differently: Take 1 mg by mouth at bedtime.) 90 tablet 1   SUMAtriptan (IMITREX) 100 MG tablet TAKE 1 TABLET EVERY 2 HOURS AS NEEDED FOR MIGRAINE. MAY REPEAT IN 2 HOURS IF HEADACHE PERSISTS OR RECURS (Patient taking differently: Take 100 mg by mouth See admin instructions. 100 mg at the onset of migraine. May repeat 100 mg in 2 hours if headache persists or recurs.) 27 tablet 0   Vitamin D, Ergocalciferol, (DRISDOL) 1.25 MG (50000 UNIT) CAPS capsule TAKE  1 CAPSULE EVERY 7 DAYS (Patient taking differently: Take 50,000 Units by mouth every Sunday.) 5 capsule 9   zonisamide (ZONEGRAN) 100 MG capsule Take 1 capsule (100 mg total) by mouth daily. (Patient taking differently: Take 100 mg by mouth in the morning.) 30 capsule 5   No current facility-administered medications on file prior to visit.    ALLERGIES: Allergies  Allergen Reactions   Codeine Nausea And Vomiting    FAMILY HISTORY: Family History  Problem Relation Age of Onset   Dementia Mother    Dementia Father       Objective:  Blood pressure (!) 148/69, pulse 87, height 5\' 2"  (1.575 m), weight 125 lb 3.2 oz (56.8 kg). General: No acute distress.  Patient appears well-groomed.   Head:  Normocephalic/atraumatic Eyes:  Fundi examined but not visualized Neck: supple, no paraspinal tenderness, full range of motion Heart:  Regular rate and rhythm Back: No paraspinal tenderness Neurological Exam: alert and oriented to person, place, and time.  Speech fluent and not dysarthric, language intact.  Right APD.  Right INO.  Otherwise, CN  II-XII intact. Muscle strength 3+ bilateral hip flexion, 4+/5 right ankle dorsiflexion.  Sensation to pinprick and vibration slightly reduced in right upper and lower extremities.  Deep tendon reflexes 3+ on left, 2+ on right, Babinski sign present on left,.  Finger to nose testing with trace ataxia.  Able to stand up independently from wheelchair.  Unable to walk unassisted.     Shon Millet, DO  CC: Trena Platt, MD

## 2022-07-16 NOTE — Assessment & Plan Note (Signed)
Had suicidal attempt with Zoloft overdose Was admitted at [facility] behavioral health facility On Zoloft 25 mg daily Followed by [facility] therapy

## 2022-07-16 NOTE — Assessment & Plan Note (Signed)
On Levothyroxine 50 mcg QD Check TSH and free T4 

## 2022-07-16 NOTE — Progress Notes (Signed)
Established Patient Office Visit  Subjective:  Patient ID: Pamela Roman, female    DOB: 03/27/1956  Age: 67 y.o. MRN: [id number]  CC:  Chief Complaint  Patient presents with   Follow-up    Patient is here for a six month follow up    HPI [name] is a 67 y.o. female with past medical history of progressive multiple sclerosis, physical deconditioning, osteoporosis, multiple falls, hypothyroidism and depression who presents for f/u of her chronic medical conditions.  MS: She recently had MRI of brain and thoracic lumbar spine due to recent worsening of leg weakness.  She was admitted for MS flareup and was given IV steroids.  She had contracted COVID infection while in the hospital, and was later sent to rehab.  She currently has mild cough, but denies any dyspnea or wheezing.  She takes Mayzent for MS and follows up with neurologist. She reports having multiple falls in the past. She complains of recent worsening of fatigue and leg weakness. She has difficulty ambulating in her home due to progressive leg weakness.  She has a powered wheelchair for better mobility and to reduce her risk of recurrent falls.  She is undergoing home PT for better mobility.  Hypothyroidism: She takes levothyroxine 50 mcg QD.  She denies any recent change in weight or appetite.  MDD: She takes Zoloft 25 mg daily.  Followed by psychiatry now.  Denies any anhedonia, SI or HI currently.   Past Medical History:  Diagnosis Date   Depression    [employer] 08/06/2020   Hx of migraines    Hyperglycemia    Hypothyroidism    Migraines    Multiple sclerosis ([place])     Past Surgical History:  Procedure Laterality Date   CATARACT EXTRACTION W/PHACO Left 01/01/2020   Procedure: CATARACT EXTRACTION PHACO AND INTRAOCULAR LENS PLACEMENT LEFT EYE;  Surgeon: Baruch Goldmann, MD;  Location: [facility];  Service: Ophthalmology;  Laterality: Left;  CDE: 3.37   CATARACT EXTRACTION W/PHACO Right 01/19/2020   Procedure:  CATARACT EXTRACTION PHACO AND INTRAOCULAR LENS PLACEMENT RIGHT EYE;  Surgeon: Baruch Goldmann, MD;  Location: [facility];  Service: Ophthalmology;  Laterality: Right;  CDE: 3.16   TONSILLECTOMY      Family History  Problem Relation Age of Onset   Dementia Mother    Dementia Father     Social History   Socioeconomic History   Marital status: Widowed    Spouse name: Not on file   Number of children: 0   Years of education: 16   Highest education level: 12th grade  Occupational History   Not on file  Tobacco Use   Smoking status: Never    Passive exposure: Never   Smokeless tobacco: Never   Tobacco comments:    Lived with smoker  Vaping Use   Vaping Use: Never used  Substance and Sexual Activity   Alcohol use: Not Currently   Drug use: Never   Sexual activity: Not Currently    Partners: Male  Other Topics Concern   Not on file  Social History Narrative   ** Merged History Encounter ** Lives with her sister two story home stays on the first floor   Left handed   Caffeine none   Social Determinants of Health   Financial Resource Strain: Low Risk  (04/01/2022)   Overall Financial Resource Strain (CARDIA)    Difficulty of Paying Living Expenses: Not hard at all  Food Insecurity: No Food Insecurity (04/01/2022)   Hunger  Vital Sign    Worried About Programme researcher, broadcasting/film/video in the Last Year: Never true    Ran Out of Food in the Last Year: Never true  Transportation Needs: No Transportation Needs (04/01/2022)   PRAPARE - Administrator, Civil Service (Medical): No    Lack of Transportation (Non-Medical): No  Physical Activity: Sufficiently Active (04/01/2022)   Exercise Vital Sign    Days of Exercise per Week: 5 days    Minutes of Exercise per Session: 30 min  Stress: Stress Concern Present (04/01/2022)   [employer] of Occupational Health - Occupational Stress Questionnaire    Feeling of Stress : Rather much  Social Connections: Moderately Integrated (04/01/2022)    Social Connection and Isolation Panel [NHANES]    Frequency of Communication with Friends and Family: More than three times a week    Frequency of Social Gatherings with Friends and Family: More than three times a week    Attends Religious Services: More than 4 times per year    Active Member of [employer] or Organizations: Yes    Attends Banker Meetings: More than 4 times per year    Marital Status: Widowed  Intimate Partner Violence: Not At Risk (04/01/2022)   Humiliation, Afraid, Rape, and Kick questionnaire    Fear of Current or Ex-Partner: No    Emotionally Abused: No    Physically Abused: No    Sexually Abused: No    Outpatient Medications Prior to Visit  Medication Sig Dispense Refill   Cholecalciferol (VITAMIN D3) 125 MCG (5000 UT) CAPS Take by mouth.     alendronate (FOSAMAX) 70 MG tablet TAKE 1 TABLET ONCE A WEEK WITH A FULL GLASS OF WATER ON AN EMPTY STOMACH (Patient taking differently: Take 70 mg by mouth every Sunday.) 12 tablet 3   BETA CAROTENE PO Take 1 capsule by mouth in the morning. Vitamin A, unknown strength.     levothyroxine (SYNTHROID) 50 MCG tablet TAKE 1 TABLET DAILY BEFORE BREAKFAST (Patient taking differently: Take 50 mcg by mouth daily before breakfast. Brand name Synthroid.) 90 tablet 3   Multiple Vitamins-Minerals (ONE-A-DAY WOMENS 50 PLUS) TABS Take 1 tablet by mouth in the morning.     sertraline (ZOLOFT) 25 MG tablet TAKE 1 TABLET DAILY (Patient taking differently: Take 25 mg by mouth in the morning.) 15 tablet 10   simvastatin (ZOCOR) 20 MG tablet TAKE 1 TABLET DAILY (Patient taking differently: Take 20 mg by mouth in the morning.) 90 tablet 3   Siponimod Fumarate (MAYZENT) 1 MG TABS TAKE 1 TABLET (1 MG) DAILY (Patient taking differently: Take 1 mg by mouth at bedtime.) 90 tablet 1   SUMAtriptan (IMITREX) 100 MG tablet TAKE 1 TABLET EVERY 2 HOURS AS NEEDED FOR MIGRAINE. MAY REPEAT IN 2 HOURS IF HEADACHE PERSISTS OR RECURS (Patient taking  differently: Take 100 mg by mouth See admin instructions. 100 mg at the onset of migraine. May repeat 100 mg in 2 hours if headache persists or recurs.) 27 tablet 0   zonisamide (ZONEGRAN) 100 MG capsule Take 1 capsule (100 mg total) by mouth daily. (Patient taking differently: Take 100 mg by mouth in the morning.) 30 capsule 5   Vitamin D, Ergocalciferol, (DRISDOL) 1.25 MG (50000 UNIT) CAPS capsule TAKE 1 CAPSULE EVERY 7 DAYS (Patient taking differently: Take 50,000 Units by mouth every Sunday.) 5 capsule 9   No facility-administered medications prior to visit.    Allergies  Allergen Reactions   Codeine Nausea And Vomiting  ROS Review of Systems  Constitutional:  Negative for chills and fever.  HENT:  Negative for congestion, sinus pain, sore throat and voice change.   Eyes:  Positive for visual disturbance. Negative for pain.  Respiratory:  Negative for cough and shortness of breath.   Cardiovascular:  Negative for chest pain and palpitations.  Gastrointestinal:  Negative for constipation, diarrhea, nausea and vomiting.  Endocrine: Negative for polydipsia and polyuria.  Genitourinary:  Negative for dysuria and hematuria.  Musculoskeletal:  Positive for arthralgias, back pain and gait problem. Negative for neck pain and neck stiffness.  Skin:  Negative for rash.  Neurological:  Positive for weakness and numbness. Negative for dizziness.  Psychiatric/Behavioral:  Negative for agitation and behavioral problems.       Objective:    Physical Exam Vitals reviewed.  Constitutional:      General: She is not in acute distress.    Appearance: She is not diaphoretic.     Comments: In wheelchair  HENT:     Head: Normocephalic and atraumatic.     Nose: Nose normal.     Mouth/Throat:     Mouth: Mucous membranes are moist.  Eyes:     General: No scleral icterus.    Extraocular Movements: Extraocular movements intact.  Cardiovascular:     Rate and Rhythm: Normal rate and regular  rhythm.     Pulses: Normal pulses.     Heart sounds: Normal heart sounds. No murmur heard. Pulmonary:     Breath sounds: Normal breath sounds. No wheezing or rales.  Musculoskeletal:     Cervical back: Neck supple. No tenderness.     Right lower leg: No edema.     Left lower leg: No edema.  Skin:    General: Skin is warm.     Findings: No rash.  Neurological:     General: No focal deficit present.     Mental Status: She is alert and oriented to person, place, and time.     Sensory: Sensory deficit (B/l LE) present.     Motor: Weakness (B/l UE and LE - 3/5) present.  Psychiatric:        Mood and Affect: Mood normal.        Behavior: Behavior normal.     BP 123/81 (BP Location: Left Arm, Patient Position: Sitting, Cuff Size: Normal)   Pulse 75   Ht 5\' 3"  (1.6 m)   Wt 124 lb 9.6 oz (56.5 kg)   SpO2 98%   BMI 22.07 kg/m  Wt Readings from Last 3 Encounters:  07/16/22 124 lb 9.6 oz (56.5 kg)  06/14/22 126 lb 1.7 oz (57.2 kg)  02/25/22 126 lb 3.2 oz (57.2 kg)    Lab Results  Component Value Date   TSH 2.230 07/16/2021   Lab Results  Component Value Date   WBC 4.7 06/14/2022   HGB 13.3 06/14/2022   HCT 40.7 06/14/2022   MCV 94.0 06/14/2022   PLT 200 06/14/2022   Lab Results  Component Value Date   NA 137 06/15/2022   K 3.6 06/15/2022   CO2 19 (L) 06/15/2022   GLUCOSE 126 (H) 06/15/2022   BUN 18 06/15/2022   CREATININE 0.75 06/15/2022   BILITOT 0.6 06/14/2022   ALKPHOS 104 06/14/2022   AST 28 06/14/2022   ALT 22 06/14/2022   PROT 6.8 06/14/2022   ALBUMIN 4.1 06/14/2022   CALCIUM 8.5 (L) 06/15/2022   ANIONGAP 11 06/15/2022   EGFR 96 07/16/2021   GFR 79.97 06/05/2021  Lab Results  Component Value Date   CHOL 200 (H) 11/06/2020   Lab Results  Component Value Date   HDL 54 11/06/2020   Lab Results  Component Value Date   LDLCALC 123 (H) 11/06/2020   Lab Results  Component Value Date   TRIG 129 11/06/2020   Lab Results  Component Value Date    CHOLHDL 3.7 11/06/2020   No results found for: "HGBA1C"    Assessment & Plan:   Problem List Items Addressed This Visit       Endocrine   Hypothyroidism    On Levothyroxine 50 mcg QD Check TSH and free T4      Relevant Orders   TSH + free T4   CMP14+EGFR     Nervous and Auditory   Multiple sclerosis (HCC) - Primary (Chronic)    On Mayzent 1 mg QD Progressive neurologic deficits Has paresthesia in the legs, physical deconditioning leading to wheelchair/rolling walker use Follows up with Neurologist - last visit note reviewed      Relevant Orders   CMP14+EGFR   CBC with Differential/Platelet     Other   HLD (hyperlipidemia)    On Simvastatin Check lipid profile      Relevant Orders   Lipid Profile   Multiple falls    Due to progressive MS leading to physical deconditioning Had PT Uses walker and powered wheelchair for ambulation      MDD (major depressive disorder), recurrent episode, moderate ([place])    Had suicidal attempt with Zoloft overdose Was [facility] at [facility] On Zoloft 25 mg daily Followed by [facility] therapy      Other Visit Diagnoses     Vitamin D deficiency       Relevant Orders   Vitamin D (25 hydroxy)   Prediabetes       Relevant Orders   Hemoglobin A1c       No orders of the defined types were placed in this encounter.   Follow-up: Return in about 6 months (around 01/14/2023) for Hypothyroidism and MS.    Lindell Spar, MD

## 2022-07-16 NOTE — Patient Instructions (Addendum)
Please start taking Vitamin D 5000 IU once daily.  Please continue taking other medications as prescribed.  Please consider getting Shingrix and Td vaccines at local pharmacy.

## 2022-07-16 NOTE — Assessment & Plan Note (Signed)
Due to progressive MS leading to physical deconditioning Had PT Uses walker and powered wheelchair for ambulation

## 2022-07-16 NOTE — Assessment & Plan Note (Signed)
On Simvastatin Check lipid profile 

## 2022-07-16 NOTE — Assessment & Plan Note (Signed)
On Mayzent 1 mg QD Progressive neurologic deficits Has paresthesia in the legs, physical deconditioning leading to wheelchair/rolling walker use Follows up with Neurologist - last visit note reviewed

## 2022-07-17 LAB — CBC WITH DIFFERENTIAL/PLATELET
Basophils Absolute: 0 10*3/uL (ref 0.0–0.2)
Basos: 0 %
EOS (ABSOLUTE): 0.1 10*3/uL (ref 0.0–0.4)
Eos: 1 %
Hematocrit: 41.8 % (ref 34.0–46.6)
Hemoglobin: 13.8 g/dL (ref 11.1–15.9)
Immature Grans (Abs): 0 10*3/uL (ref 0.0–0.1)
Immature Granulocytes: 1 %
Lymphocytes Absolute: 0.4 10*3/uL — ABNORMAL LOW (ref 0.7–3.1)
Lymphs: 10 %
MCH: 30.5 pg (ref 26.6–33.0)
MCHC: 33 g/dL (ref 31.5–35.7)
MCV: 92 fL (ref 79–97)
Monocytes Absolute: 0.6 10*3/uL (ref 0.1–0.9)
Monocytes: 13 %
Neutrophils Absolute: 3.1 10*3/uL (ref 1.4–7.0)
Neutrophils: 75 %
Platelets: 260 10*3/uL (ref 150–450)
RBC: 4.53 x10E6/uL (ref 3.77–5.28)
RDW: 12.7 % (ref 11.7–15.4)
WBC: 4.1 10*3/uL (ref 3.4–10.8)

## 2022-07-17 LAB — TSH+FREE T4
Free T4: 1.28 ng/dL (ref 0.82–1.77)
TSH: 2.19 u[IU]/mL (ref 0.450–4.500)

## 2022-07-17 LAB — HEMOGLOBIN A1C
Est. average glucose Bld gHb Est-mCnc: 117 mg/dL
Hgb A1c MFr Bld: 5.7 % — ABNORMAL HIGH (ref 4.8–5.6)

## 2022-07-17 LAB — CMP14+EGFR
ALT: 41 IU/L — ABNORMAL HIGH (ref 0–32)
AST: 40 IU/L (ref 0–40)
Albumin/Globulin Ratio: 1.8 (ref 1.2–2.2)
Albumin: 4.1 g/dL (ref 3.9–4.9)
Alkaline Phosphatase: 128 IU/L — ABNORMAL HIGH (ref 44–121)
BUN/Creatinine Ratio: 20 (ref 12–28)
BUN: 14 mg/dL (ref 8–27)
Bilirubin Total: 0.5 mg/dL (ref 0.0–1.2)
CO2: 22 mmol/L (ref 20–29)
Calcium: 9 mg/dL (ref 8.7–10.3)
Chloride: 105 mmol/L (ref 96–106)
Creatinine, Ser: 0.71 mg/dL (ref 0.57–1.00)
Globulin, Total: 2.3 g/dL (ref 1.5–4.5)
Glucose: 92 mg/dL (ref 70–99)
Potassium: 4.4 mmol/L (ref 3.5–5.2)
Sodium: 141 mmol/L (ref 134–144)
Total Protein: 6.4 g/dL (ref 6.0–8.5)
eGFR: 94 mL/min/{1.73_m2} (ref >59)

## 2022-07-17 LAB — LIPID PANEL
Chol/HDL Ratio: 4.3 ratio (ref 0.0–4.4)
Cholesterol, Total: 218 mg/dL — ABNORMAL HIGH (ref 100–199)
HDL: 51 mg/dL (ref >39)
LDL Chol Calc (NIH): 144 mg/dL — ABNORMAL HIGH (ref 0–99)
Triglycerides: 128 mg/dL (ref 0–149)
VLDL Cholesterol Cal: 23 mg/dL (ref 5–40)

## 2022-07-17 LAB — VITAMIN D 25 HYDROXY (VIT D DEFICIENCY, FRACTURES): Vit D, 25-Hydroxy: 42.3 ng/mL (ref 30.0–100.0)

## 2022-07-20 ENCOUNTER — Ambulatory Visit (INDEPENDENT_AMBULATORY_CARE_PROVIDER_SITE_OTHER): Payer: Medicare Other | Admitting: Neurology

## 2022-07-20 ENCOUNTER — Telehealth: Payer: Self-pay

## 2022-07-20 ENCOUNTER — Other Ambulatory Visit: Payer: Medicare Other

## 2022-07-20 ENCOUNTER — Encounter: Payer: Self-pay | Admitting: Neurology

## 2022-07-20 VITALS — BP 148/69 | HR 87 | Ht 62.0 in | Wt 125.2 lb

## 2022-07-20 DIAGNOSIS — G43009 Migraine without aura, not intractable, without status migrainosus: Secondary | ICD-10-CM

## 2022-07-20 DIAGNOSIS — Z114 Encounter for screening for human immunodeficiency virus [HIV]: Secondary | ICD-10-CM

## 2022-07-20 DIAGNOSIS — E559 Vitamin D deficiency, unspecified: Secondary | ICD-10-CM

## 2022-07-20 DIAGNOSIS — R200 Anesthesia of skin: Secondary | ICD-10-CM

## 2022-07-20 DIAGNOSIS — G35 Multiple sclerosis: Secondary | ICD-10-CM | POA: Diagnosis not present

## 2022-07-20 LAB — CBC WITH DIFFERENTIAL/PLATELET
Basophils Absolute: 0 10*3/uL (ref 0.0–0.1)
Basophils Relative: 0.3 % (ref 0.0–3.0)
Eosinophils Absolute: 0.1 10*3/uL (ref 0.0–0.7)
Eosinophils Relative: 1.3 % (ref 0.0–5.0)
HCT: 40.8 % (ref 36.0–46.0)
Hemoglobin: 13.7 g/dL (ref 12.0–15.0)
Lymphocytes Relative: 7.6 % — ABNORMAL LOW (ref 12.0–46.0)
Lymphs Abs: 0.3 10*3/uL — ABNORMAL LOW (ref 0.7–4.0)
MCHC: 33.7 g/dL (ref 30.0–36.0)
MCV: 92 fl (ref 78.0–100.0)
Monocytes Absolute: 0.5 10*3/uL (ref 0.1–1.0)
Monocytes Relative: 11.5 % (ref 3.0–12.0)
Neutro Abs: 3.1 10*3/uL (ref 1.4–7.7)
Neutrophils Relative %: 79.3 % — ABNORMAL HIGH (ref 43.0–77.0)
Platelets: 286 10*3/uL (ref 150.0–400.0)
RBC: 4.43 Mil/uL (ref 3.87–5.11)
RDW: 13.9 % (ref 11.5–15.5)
WBC: 3.9 10*3/uL — ABNORMAL LOW (ref 4.0–10.5)

## 2022-07-20 LAB — COMPREHENSIVE METABOLIC PANEL
ALT: 35 U/L (ref 0–35)
AST: 35 U/L (ref 0–37)
Albumin: 4.2 g/dL (ref 3.5–5.2)
Alkaline Phosphatase: 110 U/L (ref 39–117)
BUN: 22 mg/dL (ref 6–23)
CO2: 27 mEq/L (ref 19–32)
Calcium: 9.6 mg/dL (ref 8.4–10.5)
Chloride: 108 mEq/L (ref 96–112)
Creatinine, Ser: 0.77 mg/dL (ref 0.40–1.20)
GFR: 80.58 mL/min (ref >60.00)
Glucose, Bld: 107 mg/dL — ABNORMAL HIGH (ref 70–99)
Potassium: 4 mEq/L (ref 3.5–5.1)
Sodium: 143 mEq/L (ref 135–145)
Total Bilirubin: 0.5 mg/dL (ref 0.2–1.2)
Total Protein: 6.7 g/dL (ref 6.0–8.3)

## 2022-07-20 LAB — HEPATIC FUNCTION PANEL
ALT: 35 U/L (ref 0–35)
AST: 35 U/L (ref 0–37)
Albumin: 4.2 g/dL (ref 3.5–5.2)
Alkaline Phosphatase: 110 U/L (ref 39–117)
Bilirubin, Direct: 0.1 mg/dL (ref 0.0–0.3)
Total Bilirubin: 0.5 mg/dL (ref 0.2–1.2)
Total Protein: 6.7 g/dL (ref 6.0–8.3)

## 2022-07-20 LAB — VITAMIN B12: Vitamin B-12: 597 pg/mL (ref 211–911)

## 2022-07-20 LAB — VITAMIN D 25 HYDROXY (VIT D DEFICIENCY, FRACTURES): VITD: 49.58 ng/mL (ref 30.00–100.00)

## 2022-07-20 NOTE — Patient Instructions (Addendum)
Plan to switch to [facility].   Continue sumatriptan and zonisamide for migraines Check Labs Follow up 6 months.

## 2022-07-20 NOTE — Telephone Encounter (Signed)
Patient seen in office. Per Dr.Jaffe patient to start [place].  PA team please start a PA for [facility]

## 2022-07-21 LAB — VARICELLA ZOSTER ABS, IGG/IGM
Varicella IgM: <0.91 index (ref 0.00–0.90)
Varicella zoster IgG: 1885 index (ref >165)

## 2022-07-21 LAB — SPECIMEN STATUS REPORT

## 2022-07-22 LAB — QUANTIFERON-TB GOLD PLUS
Mitogen-NIL: >10 IU/mL
NIL: 0.02 IU/mL
QuantiFERON-TB Gold Plus: NEGATIVE
TB1-NIL: 0 IU/mL
TB2-NIL: 0.01 IU/mL

## 2022-07-22 LAB — HIV ANTIBODY (ROUTINE TESTING W REFLEX): HIV 1&2 Ab, 4th Generation: NONREACTIVE

## 2022-07-22 LAB — ACUTE HEP PANEL AND HEP B SURFACE AB
HEPATITIS C ANTIBODY REFILL$(REFL): NONREACTIVE
Hep A IgM: NONREACTIVE
Hep B C IgM: NONREACTIVE
Hepatitis B Surface Ag: NONREACTIVE

## 2022-07-22 LAB — REFLEX TIQ

## 2022-07-23 ENCOUNTER — Telehealth: Payer: Self-pay

## 2022-07-23 NOTE — Telephone Encounter (Signed)
-----  Message from Pieter Partridge, DO sent at 07/23/2022  7:04 AM EST ----- All labs are back.  We can initiate [facility].

## 2022-07-23 NOTE — Telephone Encounter (Signed)
Patient advised Dr.Jaffe wants to go ahead and itiate [employer].  PA team please start a PA for Pamela Roman.

## 2022-07-23 NOTE — Telephone Encounter (Signed)
LMOVM for patient to call back in regards to lab results.    Start form for [facility] sent with labs results attached.

## 2022-07-29 ENCOUNTER — Other Ambulatory Visit (HOSPITAL_COMMUNITY): Payer: Self-pay

## 2022-07-29 NOTE — Telephone Encounter (Signed)
What is the dosage and directions? I do not see this information on file to be able to start a prior authorization request. Thank you

## 2022-08-05 ENCOUNTER — Other Ambulatory Visit (HOSPITAL_COMMUNITY): Payer: Self-pay

## 2022-08-05 NOTE — Telephone Encounter (Signed)
Please include the quantity and sig for usage in order to initiate PA

## 2022-08-06 ENCOUNTER — Telehealth (HOSPITAL_BASED_OUTPATIENT_CLINIC_OR_DEPARTMENT_OTHER): Payer: Medicare Other | Admitting: Psychiatry

## 2022-08-06 ENCOUNTER — Encounter (HOSPITAL_COMMUNITY): Payer: Self-pay | Admitting: Psychiatry

## 2022-08-06 DIAGNOSIS — F332 Major depressive disorder, recurrent severe without psychotic features: Secondary | ICD-10-CM

## 2022-08-06 DIAGNOSIS — G35 Multiple sclerosis: Secondary | ICD-10-CM

## 2022-08-06 NOTE — Progress Notes (Signed)
BH MD/PA/NP OP Progress Note  08/06/2022 9:23 AM Pamela Roman  MRN:  [id number]  Visit Diagnosis:    ICD-10-CM   1. Severe episode of recurrent major depressive disorder, without psychotic features (HCC)  F33.2     2. Multiple sclerosis (HCC)  G35       Assessment: Pamela Roman is a 67 y.o. female with a history of MDD who presented to [facility] for initial evaluation on 02/26/22 following a suicide attempt by intentional overdose on 02/10/22.   At initial evaluation patient reported that she had been struggling with depressed mood, fatigue, isolated, and hopelessness about her future leading up to the suicide attempt. She denied any prior self harm or thoughts of suicide prior to that along with and AVH, manic symptoms, paranoia, or feelings of being a burden. Of note patient has MS that has been progressing in severity which had in turn left her feeling isolated. Since discharging from the inpatient psychiatric facility patient reported feeling more hopeful, future oriented, less isolated (family in touch and visiting more), and motivated to make adjustments in her life so she can engage in the activities she enjoys. Patient denied any suicidality at time of initial evaluation and was able to engage in making a safety plan with several steps for if depression/suicidality were to progress.   Pamela Roman presents for follow-up evaluation. Today, 08/06/22, patient reports that the last couple months have been up and down. She is doing really well now, however had a bad MS flare up in December where she had to be hospitalized. While this was difficult patient reports having a lot of support from her family and church. She reports benefit from the Zoloft and denies any adverse side effects. Will continue on her current regimen and follow up in 2 months.   Plan: - Continue Zoloft 25 mg QD, will keep script to 15 days and no medication scripts should exceed 30 days - TSH, Vit D  WNL - MRI brain and spine reviewed, new demyelinating lesions in the the thoracic spine visualized - Crisis response plan discussed, along with crisis resources - Therapy/care coordination with [facility] once a week - Supportive interviewing techniques employed, patient working towards isolating less and connecting with more social supports -  Follow up in 8 weeks   Chief Complaint:  Chief Complaint  Patient presents with   Follow-up   HPI: Pamela Roman presents reporting that she is doing fine now, but had thought she was at the end of her rope in December. She had her worse flare up of MS in December and could not move at all. She had 2 MS flare ups in the past year the most recent one being in December leading to her hospitalization and step down to a rehab center. Patient unfortunately caught Covid while hospitalized and thus was not able to do much at rehab since she was quarantined. Following this she met with her neurologist who adjusted her MS regimen to [facility]. Patient reports she is still waiting on insurance to approve this.  Doing well now physically and mentally. Has been getting around her place ok since getting back home. Just waiting to hear about the new medicine and believes they will find a way one way or the other to get her on it. Her church and family were great supports during that time. Especially since she had to spend her birthday in the hospital.    Past Psychiatric History: Patient reports that  she had no prior psych history until 2021. At that time she was struggling with grief and depression following the passing of her husband and was started on Zoloft 25 mg, which she continued up until she overdosed on 02/10/22. Following her inpatient admission to [facility] patient ws discharged on Zoloft 50 mg QD. She reported stopping the medication after her discharge as she was feeling better. She denies any trials of other psychotropic medications.   Past  Medical History:  Past Medical History:  Diagnosis Date   Depression    Phreesia 08/06/2020   Hx of migraines    Hyperglycemia    Hypothyroidism    Migraines    Multiple sclerosis ([place])     Past Surgical History:  Procedure Laterality Date   CATARACT EXTRACTION W/PHACO Left 01/01/2020   Procedure: CATARACT EXTRACTION PHACO AND INTRAOCULAR LENS PLACEMENT LEFT EYE;  Surgeon: Baruch Goldmann, MD;  Location: [facility];  Service: Ophthalmology;  Laterality: Left;  CDE: 3.37   CATARACT EXTRACTION W/PHACO Right 01/19/2020   Procedure: CATARACT EXTRACTION PHACO AND INTRAOCULAR LENS PLACEMENT RIGHT EYE;  Surgeon: Baruch Goldmann, MD;  Location: [facility];  Service: Ophthalmology;  Laterality: Right;  CDE: 3.16   TONSILLECTOMY      Family Psychiatric History: Denies  Family History:  Family History  Problem Relation Age of Onset   Dementia Mother    Dementia Father     Social History:  Social History   Socioeconomic History   Marital status: Widowed    Spouse name: Not on file   Number of children: 0   Years of education: 37   Highest education level: 12th grade  Occupational History   Not on file  Tobacco Use   Smoking status: Never    Passive exposure: Never   Smokeless tobacco: Never   Tobacco comments:    Lived with smoker  Vaping Use   Vaping Use: Never used  Substance and Sexual Activity   Alcohol use: Not Currently   Drug use: Never   Sexual activity: Not Currently    Partners: Male  Other Topics Concern   Not on file  Social History Narrative   ** Merged History Encounter ** Lives with her sister two story home stays on the first floor   Left handed   Caffeine none   Social Determinants of Health   Financial Resource Strain: Low Risk  (04/01/2022)   Overall Financial Resource Strain (CARDIA)    Difficulty of Paying Living Expenses: Not hard at all  Food Insecurity: No Food Insecurity (04/01/2022)   Hunger Vital Sign    Worried About Running Out of Food in the  Last Year: Never true    Ran Out of Food in the Last Year: Never true  Transportation Needs: No Transportation Needs (04/01/2022)   PRAPARE - Hydrologist (Medical): No    Lack of Transportation (Non-Medical): No  Physical Activity: Sufficiently Active (04/01/2022)   Exercise Vital Sign    Days of Exercise per Week: 5 days    Minutes of Exercise per Session: 30 min  Stress: Stress Concern Present (04/01/2022)   [place]    Feeling of Stress : Rather much  Social Connections: Moderately Integrated (04/01/2022)   Social Connection and Isolation Panel [NHANES]    Frequency of Communication with Friends and Family: More than three times a week    Frequency of Social Gatherings with Friends and Family: More  than three times a week    Attends Religious Services: More than 4 times per year    Active Member of Clubs or Organizations: Yes    Attends Archivist Meetings: More than 4 times per year    Marital Status: Widowed    Allergies:  Allergies  Allergen Reactions   Codeine Nausea And Vomiting    Current Medications: Current Outpatient Medications  Medication Sig Dispense Refill   alendronate (FOSAMAX) 70 MG tablet TAKE 1 TABLET ONCE A WEEK WITH A FULL GLASS OF WATER ON AN EMPTY STOMACH (Patient taking differently: Take 70 mg by mouth every Sunday.) 12 tablet 3   BETA CAROTENE PO Take 1 capsule by mouth in the morning. Vitamin A, unknown strength.     Cholecalciferol (VITAMIN D3) 125 MCG (5000 UT) CAPS Take by mouth.     levothyroxine (SYNTHROID) 50 MCG tablet TAKE 1 TABLET DAILY BEFORE BREAKFAST (Patient taking differently: Take 50 mcg by mouth daily before breakfast. Brand name Synthroid.) 90 tablet 3   Multiple Vitamins-Minerals (ONE-A-DAY WOMENS 50 PLUS) TABS Take 1 tablet by mouth in the morning.     sertraline (ZOLOFT) 25 MG tablet TAKE 1 TABLET DAILY (Patient taking differently:  Take 25 mg by mouth in the morning.) 15 tablet 10   simvastatin (ZOCOR) 20 MG tablet TAKE 1 TABLET DAILY (Patient taking differently: Take 20 mg by mouth in the morning.) 90 tablet 3   Siponimod Fumarate (MAYZENT) 1 MG TABS TAKE 1 TABLET (1 MG) DAILY (Patient taking differently: Take 1 mg by mouth at bedtime.) 90 tablet 1   SUMAtriptan (IMITREX) 100 MG tablet TAKE 1 TABLET EVERY 2 HOURS AS NEEDED FOR MIGRAINE. MAY REPEAT IN 2 HOURS IF HEADACHE PERSISTS OR RECURS (Patient taking differently: Take 100 mg by mouth See admin instructions. 100 mg at the onset of migraine. May repeat 100 mg in 2 hours if headache persists or recurs.) 27 tablet 0   zonisamide (ZONEGRAN) 100 MG capsule Take 1 capsule (100 mg total) by mouth daily. (Patient taking differently: Take 100 mg by mouth in the morning.) 30 capsule 5   No current facility-administered medications for this visit.     Psychiatric Specialty Exam: Review of Systems  There were no vitals taken for this visit.There is no height or weight on file to calculate BMI.  General Appearance: Well Groomed  Eye Contact:  Good  Speech:  Clear and Coherent  Volume:  Normal  Mood:  Euthymic  Affect:  Congruent  Thought Process:  Coherent and Goal Directed  Orientation:  Full (Time, Place, and Person)  Thought Content: Logical   Suicidal Thoughts:  No  Homicidal Thoughts:  No  Memory:  Immediate;   Good  Judgement:  Good  Insight:  Good  Psychomotor Activity:  NA  Concentration:  Concentration: Good  Recall:  Good  Fund of Knowledge: Good  Language: Good  Akathisia:  No    AIMS (if indicated): not done  Assets:  Communication Skills Desire for Improvement  ADL's:  Intact  Cognition: WNL  Sleep:  Good   Metabolic Disorder Labs: Lab Results  Component Value Date   HGBA1C 5.7 (H) 07/16/2022   No results found for: "PROLACTIN" Lab Results  Component Value Date   CHOL 218 (H) 07/16/2022   TRIG 128 07/16/2022   HDL 51 07/16/2022   CHOLHDL  4.3 07/16/2022   LDLCALC 144 (H) 07/16/2022   LDLCALC 123 (H) 11/06/2020   Lab Results  Component Value Date  TSH 2.190 07/16/2022   TSH 2.230 07/16/2021    Therapeutic Level Labs: No results found for: "LITHIUM" No results found for: "VALPROATE" No results found for: "CBMZ"   Screenings: [place] Office Visit from 06/19/2022 in [facility]  Total GAD-7 Score 0      [place] Office Visit from 03/12/2021 in [facility]  Total Score (max 30 points ) 30      PHQ2-9    [place] Visit from 07/16/2022 in [facility] Office Visit from 06/19/2022 in [facility] Care Coordination from 04/01/2022 in [place] Visit from 02/26/2022 in [facility] Office Visit from 02/25/2022 in [place] [facility]  PHQ-2 Total Score 0 0 4 4 6   PHQ-9 Total Score -- 0 10 8 9       Flowsheet Row ED to Hosp-Admission (Discharged) from 06/14/2022 in [place] Office Visit from 02/26/2022 in [facility] ED from 02/10/2022 in [facility] Department at [place] No Risk High Risk High Risk       Collaboration of Care: Collaboration of Care: Primary Care Provider AEB chart review and Other provider involved in patient's care [place] [facility] hospital chart review  Patient/Guardian was advised Release of Information must be obtained prior to any record release in order to collaborate their care with an outside provider. Patient/Guardian was advised if they have not already done so to contact the registration department to sign all necessary forms in order for [place] to release information regarding their care.   Consent: Patient/Guardian gives verbal consent for  treatment and assignment of benefits for services provided during this visit. Patient/Guardian expressed understanding and agreed to proceed.    Vista Mink, MD 08/06/2022, 9:23 AM   Virtual Visit via Video Note  I connected with Pamela Roman on 08/06/22 at 10:00 AM EST by a video enabled telemedicine application and verified that I am speaking with the correct person using two identifiers.  Location: Patient: Home Provider: Home Office   I discussed the limitations of evaluation and management by telemedicine and the availability of in person appointments. The patient expressed understanding and agreed to proceed.   I discussed the assessment and treatment plan with the patient. The patient was provided an opportunity to ask questions and all were answered. The patient agreed with the plan and demonstrated an understanding of the instructions.   The patient was advised to call back or seek an in-person evaluation if the symptoms worsen or if the condition fails to improve as anticipated.  I provided 20 minutes of non-face-to-face time during this encounter.   Vista Mink, MD

## 2022-08-07 ENCOUNTER — Telehealth: Payer: Self-pay | Admitting: Neurology

## 2022-08-07 NOTE — Telephone Encounter (Signed)
Insurance called in and left a message stating they are waiting on clinical questions to be answered for the [facility]. PA Case # [id number]

## 2022-08-10 ENCOUNTER — Other Ambulatory Visit (HOSPITAL_COMMUNITY): Payer: Self-pay

## 2022-08-11 NOTE — Telephone Encounter (Signed)
PA has already submitted and is in process for this patient and drug.;ELFYBO:[id number];NIDPOE:In Process

## 2022-08-13 ENCOUNTER — Other Ambulatory Visit (HOSPITAL_COMMUNITY): Payer: Self-pay

## 2022-08-13 ENCOUNTER — Telehealth: Payer: Self-pay | Admitting: Pharmacy Technician

## 2022-08-13 NOTE — Telephone Encounter (Signed)
Received a fax regarding Prior Authorization from [place] for [facility] 10MG . Authorization has been DENIED because PATIENT MUST TRY TWO FORMULARY ALTERNATIVES BEFORE COVERAGE CAN BE ALLOWED. FORMULARY ALTERNATIVES WOULD BE TERIFLUNOMIDE AND VUMERITY ( DIMETHLY FUMARATE) OR CLINICAL REASON WHY THEY CAN NOT TRY.   Phone#[phone]   Received notification from [place] that prior authorization for [facility] 10MG  is required.   PA submitted on 2.8.24 VIA PHONE CASE # [id number] Status is pending PHONE: [phone] Margarita Grizzle)

## 2022-08-13 NOTE — Telephone Encounter (Signed)
Pt called in wanting an update on the info needed from the insurance.

## 2022-08-14 NOTE — Telephone Encounter (Signed)
We will proceed with Vumerity.  She should not be taking with high-calorie meals.  PA team please start a PA for Vumerity

## 2022-08-14 NOTE — Telephone Encounter (Signed)
Sorry PA team start a Production designer, theatre/television/film for [employer].   Once denied send to [place] so they can start the process of free drug.

## 2022-08-14 NOTE — Telephone Encounter (Signed)
Pt called in and left a message. She would like an update on her [place]. She saw her insurance denied it and would like to know what to do at this point?

## 2022-08-14 NOTE — Telephone Encounter (Signed)
Per MS [employer], Patient can be enrolled in the free medication program and given medication while waiting on the PA.  Please advise.

## 2022-08-18 NOTE — Telephone Encounter (Signed)
Tried calling addy back no answer.

## 2022-08-18 NOTE — Telephone Encounter (Signed)
CoverMyMeds is calling in asking for an update.

## 2022-08-25 NOTE — Telephone Encounter (Signed)
Patient advised of note 08/14/22 1:34 pm

## 2022-08-25 NOTE — Telephone Encounter (Signed)
Pt called stating she received a call from [facility], letting her know they have not heard back from Dr Tomi Likens regarding her medication. States she has been without medication for several days.

## 2022-08-27 ENCOUNTER — Telehealth: Payer: Self-pay | Admitting: Neurology

## 2022-08-27 NOTE — Telephone Encounter (Signed)
Pt called in wanting to speak with someone about the MS lifeline. They have not heard from our office. She also would like to find out about her [place].

## 2022-08-27 NOTE — Telephone Encounter (Signed)
Advised patient we advised MS line that the medication is still working on approval, They advised patient  can call to see if she could get assistance until the medication is approved.

## 2022-09-01 NOTE — Telephone Encounter (Signed)
Pamela Roman is calling in from [employer] that Pamela Roman called in to them and they still do not have an appeal request on file.

## 2022-09-01 NOTE — Telephone Encounter (Signed)
Spoke to rep they are wanting to know if we have start a Appeal for the [place].  Patient appiled for the  Assistance for the medication until we can have the appeal done if we plan on doing it.  If not patient insurance covers Vurmitery, Terifunomide,Dimethyl fumerarte

## 2022-09-03 NOTE — Progress Notes (Signed)
Appeal started, Seven day process Rep [place] [phone].

## 2022-09-04 ENCOUNTER — Telehealth: Payer: Self-pay

## 2022-09-04 NOTE — Telephone Encounter (Signed)
Fax received from [place] line: Patient approved for Assistance  for [facility] 09/03/22-07/06/23.

## 2022-09-09 NOTE — Progress Notes (Signed)
[place] approved 07/06/22-09/04/23.  Letter faxed to MS line.

## 2022-09-15 ENCOUNTER — Telehealth: Payer: Self-pay

## 2022-09-15 NOTE — Telephone Encounter (Signed)
Patient called in stating she has a question about the medication,M[place]clad. Wanting to know if it is safe for her to go into a hospital to visit a family member who is receiving chemo. 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

## 2022-09-16 NOTE — Telephone Encounter (Signed)
Patient is calling in wanting to know if Dr.Jaffe would be able to tell her if she is okay to go to the hospital or not due to being on the medication that weakens her immune system. Asking for a call back if possible.

## 2022-09-16 NOTE — Telephone Encounter (Signed)
Patient advise of Dr.Jaffe note, The immune system is lowest for 2-3 months after receiving dose and then starts bouncing back.  But she should go to the hospital if needed

## 2022-09-21 ENCOUNTER — Ambulatory Visit: Payer: Medicare Other | Admitting: Internal Medicine

## 2022-09-22 ENCOUNTER — Telehealth: Payer: Self-pay | Admitting: Neurology

## 2022-09-22 NOTE — Telephone Encounter (Signed)
Pt called in and left a message. She stated she has taken her first dose of the [place]. It seems to be working really good. She is feeling good. She wants to thank him for getting her on it. She does not need a call back.

## 2022-10-05 ENCOUNTER — Telehealth (INDEPENDENT_AMBULATORY_CARE_PROVIDER_SITE_OTHER): Payer: Medicare Other | Admitting: Internal Medicine

## 2022-10-05 ENCOUNTER — Encounter: Payer: Self-pay | Admitting: Internal Medicine

## 2022-10-05 DIAGNOSIS — J302 Other seasonal allergic rhinitis: Secondary | ICD-10-CM

## 2022-10-05 DIAGNOSIS — L602 Onychogryphosis: Secondary | ICD-10-CM | POA: Diagnosis not present

## 2022-10-05 NOTE — Patient Instructions (Addendum)
You are being referred to Podiatry.  [address], [place], [place] [address] [phone]

## 2022-10-05 NOTE — Assessment & Plan Note (Signed)
Referred to podiatry for toenail clipping She needs assistance due to her progressive multiple sclerosis

## 2022-10-05 NOTE — Progress Notes (Signed)
Virtual Visit via Video Note   Because of Pamela Roman's co-morbid illnesses, she is at least at moderate risk for complications without adequate follow up.  This format is felt to be most appropriate for this patient at this time.  All issues noted in this document were discussed and addressed.  A limited physical exam was performed with this format.      Evaluation Performed:  Follow-up visit  Date:  10/05/2022   ID:  Pamela Roman, DOB 04-Nov-1955, MRN [id number]  Patient Location: Home Provider Location: Office/Clinic  Participants: Patient Location of Patient: Home Location of Provider: Telehealth Consent was obtain for visit to be over via telehealth. I verified that I am speaking with the correct person using two identifiers.  PCP:  Lindell Spar, MD   Chief Complaint: Thick toenails  History of Present Illness:    Pamela Roman is a 67 y.o. female who has a video visit for complaint of thick toenails.  She has difficulty cutting her toenails due to physical deconditioning from multiple sclerosis.  She requests podiatry referral for evaluation.  She also reports nasal congestion and postnasal drip for the last 2 days.  Denies any fever or chills.  Denies any dyspnea or wheezing.  She reports improvement in her symptoms today.  The patient does not have symptoms concerning for COVID-19 infection (fever, chills, cough, or new shortness of breath).   Past Medical, Surgical, Social History, Allergies, and Medications have been Reviewed.  Past Medical History:  Diagnosis Date   Depression    Phreesia 08/06/2020   Hx of migraines    Hyperglycemia    Hypothyroidism    Migraines    Multiple sclerosis    Past Surgical History:  Procedure Laterality Date   CATARACT EXTRACTION W/PHACO Left 01/01/2020   Procedure: CATARACT EXTRACTION PHACO AND INTRAOCULAR LENS PLACEMENT LEFT EYE;  Surgeon: Baruch Goldmann, MD;  Location: [facility];  Service: Ophthalmology;  Laterality: Left;   CDE: 3.37   CATARACT EXTRACTION W/PHACO Right 01/19/2020   Procedure: CATARACT EXTRACTION PHACO AND INTRAOCULAR LENS PLACEMENT RIGHT EYE;  Surgeon: Baruch Goldmann, MD;  Location: [facility];  Service: Ophthalmology;  Laterality: Right;  CDE: 3.16   TONSILLECTOMY       Current Meds  Medication Sig   alendronate (FOSAMAX) 70 MG tablet TAKE 1 TABLET ONCE A WEEK WITH A FULL GLASS OF WATER ON AN EMPTY STOMACH (Patient taking differently: Take 70 mg by mouth every Sunday.)   BETA CAROTENE PO Take 1 capsule by mouth in the morning. Vitamin A, unknown strength.   Cholecalciferol (VITAMIN D3) 125 MCG (5000 UT) CAPS Take by mouth.   Cladribine, 10 Tabs, (MAVENCLAD, 10 TABS,) 10 MG TBPK    levothyroxine (SYNTHROID) 50 MCG tablet TAKE 1 TABLET DAILY BEFORE BREAKFAST (Patient taking differently: Take 50 mcg by mouth daily before breakfast. Brand name Synthroid.)   Multiple Vitamins-Minerals (ONE-A-DAY WOMENS 50 PLUS) TABS Take 1 tablet by mouth in the morning.   sertraline (ZOLOFT) 25 MG tablet TAKE 1 TABLET DAILY (Patient taking differently: Take 25 mg by mouth in the morning.)   simvastatin (ZOCOR) 20 MG tablet TAKE 1 TABLET DAILY (Patient taking differently: Take 20 mg by mouth in the morning.)   SUMAtriptan (IMITREX) 100 MG tablet TAKE 1 TABLET EVERY 2 HOURS AS NEEDED FOR MIGRAINE. MAY REPEAT IN 2 HOURS IF HEADACHE PERSISTS OR RECURS (Patient taking differently: Take 100 mg by mouth See admin instructions. 100 mg at the onset  of migraine. May repeat 100 mg in 2 hours if headache persists or recurs.)   zonisamide (ZONEGRAN) 100 MG capsule Take 1 capsule (100 mg total) by mouth daily. (Patient taking differently: Take 100 mg by mouth in the morning.)     Allergies:   Codeine   ROS:   Please see the history of present illness.     All other systems reviewed and are negative.   Labs/Other Tests and Data Reviewed:    Recent Labs: 06/15/2022: Magnesium 1.9 07/16/2022: TSH 2.190 07/20/2022: ALT 35; ALT  35; BUN 22; Creatinine, Ser 0.77; Hemoglobin 13.7; Platelets 286.0; Potassium 4.0; Sodium 143   Recent Lipid Panel Lab Results  Component Value Date/Time   CHOL 218 (H) 07/16/2022 11:42 AM   TRIG 128 07/16/2022 11:42 AM   HDL 51 07/16/2022 11:42 AM   CHOLHDL 4.3 07/16/2022 11:42 AM   LDLCALC 144 (H) 07/16/2022 11:42 AM    Wt Readings from Last 3 Encounters:  07/20/22 125 lb 3.2 oz (56.8 kg)  07/16/22 124 lb 9.6 oz (56.5 kg)  06/14/22 126 lb 1.7 oz (57.2 kg)     Objective:    Vital Signs:  There were no vitals taken for this visit.   VITAL SIGNS:  reviewed GEN:  no acute distress EYES:  sclerae anicteric, EOMI - Extraocular Movements Intact RESPIRATORY:  normal respiratory effort, symmetric expansion NEURO:  Alert and oriented x 3 PSYCH:  normal affect  ASSESSMENT & PLAN:    Hypertrophic toenail Referred to podiatry for toenail clipping She needs assistance due to her progressive multiple sclerosis  Seasonal allergic rhinitis Her nasal congestion and postnasal drip likely due to allergic rhinitis Zyrtec as needed for allergic rhinitis   I discussed the assessment and treatment plan with the patient. The patient was provided an opportunity to ask questions, and all were answered. The patient agreed with the plan and demonstrated an understanding of the instructions.   The patient was advised to call back or seek an in-person evaluation if the symptoms worsen or if the condition fails to improve as anticipated.  The above assessment and management plan was discussed with the patient. The patient verbalized understanding of and has agreed to the management plan.   Medication Adjustments/Labs and Tests Ordered: Current medicines are reviewed at length with the patient today.  Concerns regarding medicines are outlined above.   Tests Ordered: Orders Placed This Encounter  Procedures   Ambulatory referral to Podiatry    Medication Changes: No orders of the defined  types were placed in this encounter.    Note: This dictation was prepared with Dragon dictation along with smaller phrase technology. Similar sounding words can be transcribed inadequately or may not be corrected upon review. Any transcriptional errors that result from this process are unintentional.      Disposition:  Follow up  Signed, Lindell Spar, MD  10/05/2022 4:06 PM     [facility]

## 2022-10-05 NOTE — Assessment & Plan Note (Signed)
Her nasal congestion and postnasal drip likely due to allergic rhinitis Zyrtec as needed for allergic rhinitis

## 2022-10-19 ENCOUNTER — Telehealth: Payer: Self-pay | Admitting: Anesthesiology

## 2022-10-19 NOTE — Telephone Encounter (Signed)
Pt called to inform Dr Everlena Cooper that she has received her second dose of the Mavenclad. States she is very pleased and happy that is working for her. States she would like to thank him for for prescribing to her. Does not need a call back.

## 2022-10-20 ENCOUNTER — Other Ambulatory Visit: Payer: Self-pay | Admitting: Neurology

## 2022-10-26 ENCOUNTER — Other Ambulatory Visit: Payer: Self-pay

## 2022-10-26 DIAGNOSIS — G35 Multiple sclerosis: Secondary | ICD-10-CM

## 2022-10-26 NOTE — Progress Notes (Signed)
Patient advised she has labs that will need to be drawn two months and six months after starting the [facility].   Patient to have labs drawn at her PCP office close to home.

## 2022-12-01 ENCOUNTER — Other Ambulatory Visit: Payer: Self-pay | Admitting: Neurology

## 2022-12-09 ENCOUNTER — Ambulatory Visit
Admission: RE | Admit: 2022-12-09 | Discharge: 2022-12-09 | Disposition: A | Discharge status: Home / Self Care | Payer: Medicare Other | Source: Ambulatory Visit | Attending: Internal Medicine | Admitting: Internal Medicine

## 2022-12-09 ENCOUNTER — Telehealth: Payer: Self-pay | Admitting: Neurology

## 2022-12-09 ENCOUNTER — Emergency Department (HOSPITAL_COMMUNITY)
Admission: EM | Admit: 2022-12-09 | Discharge: 2022-12-09 | Discharge status: Against Medical Advice | Payer: Medicare Other | Attending: Emergency Medicine | Admitting: Emergency Medicine

## 2022-12-09 ENCOUNTER — Encounter (HOSPITAL_COMMUNITY): Payer: Self-pay

## 2022-12-09 ENCOUNTER — Ambulatory Visit (INDEPENDENT_AMBULATORY_CARE_PROVIDER_SITE_OTHER): Payer: Medicare Other

## 2022-12-09 VITALS — Ht 63.0 in | Wt 125.0 lb

## 2022-12-09 DIAGNOSIS — W010XXA Fall on same level from slipping, tripping and stumbling without subsequent striking against object, initial encounter: Secondary | ICD-10-CM | POA: Diagnosis not present

## 2022-12-09 DIAGNOSIS — Z5321 Procedure and treatment not carried out due to patient leaving prior to being seen by health care provider: Secondary | ICD-10-CM | POA: Insufficient documentation

## 2022-12-09 DIAGNOSIS — S0101XA Laceration without foreign body of scalp, initial encounter: Secondary | ICD-10-CM | POA: Diagnosis not present

## 2022-12-09 DIAGNOSIS — S0990XA Unspecified injury of head, initial encounter: Secondary | ICD-10-CM | POA: Diagnosis present

## 2022-12-09 DIAGNOSIS — Z1231 Encounter for screening mammogram for malignant neoplasm of breast: Secondary | ICD-10-CM

## 2022-12-09 DIAGNOSIS — Z0000 Encounter for general adult medical examination without abnormal findings: Secondary | ICD-10-CM

## 2022-12-09 NOTE — Telephone Encounter (Signed)
Pt fell Sunday face first and has some cuts head. She is dizzy and wants to know what to do or if she should see a dr about this. [place]

## 2022-12-09 NOTE — Progress Notes (Signed)
 I connected with  Pamela Roman on 12/09/22 by a video and audio enabled telemedicine application and verified that I am speaking with the correct person using two identifiers.  Patient Location: Home  Provider Location: Home Office  I discussed the limitations of evaluation and management by telemedicine. The patient expressed understanding and agreed to proceed.  Patient Medicare AWV questionnaire was completed by the patient on 12/05/2022; I have confirmed that all information answered by patient is correct and no changes since this date.    Subjective:   Pamela Roman is a 67 y.o. female who presents for Medicare Annual (Subsequent) preventive examination.  Review of Systems     Cardiac Risk Factors include: advanced age (>6men, >88 women);dyslipidemia;sedentary lifestyle     Objective:    Today's Vitals   12/09/22 0825  Weight: 125 lb (56.7 kg)  Height: 5\' 3"  (1.6 m)   Body mass index is 22.14 kg/m.     12/09/2022   12:26 PM 07/20/2022    9:33 AM 06/14/2022    9:07 AM 04/01/2022    1:14 PM 02/10/2022    8:48 PM 01/14/2022   10:09 AM 06/05/2021   10:05 AM  Advanced Directives  Does Patient Have a Medical Advance Directive? No No No No No No No  Would patient like information on creating a medical advance directive? No - Patient declined  No - Patient declined No - Patient declined No - Patient declined      Current Medications (verified) Outpatient Encounter Medications as of 12/09/2022  Medication Sig   alendronate (FOSAMAX) 70 MG tablet TAKE 1 TABLET ONCE A WEEK WITH A FULL GLASS OF WATER ON AN EMPTY STOMACH (Patient taking differently: Take 70 mg by mouth every Sunday.)   Cholecalciferol (VITAMIN D3) 125 MCG (5000 UT) CAPS Take by mouth.   Cladribine, 10 Tabs, (MAVENCLAD, 10 TABS,) 10 MG TBPK    gabapentin (NEURONTIN) 100 MG capsule Take 200 mg by mouth in the morning and at bedtime.   levothyroxine (SYNTHROID) 50 MCG tablet TAKE 1 TABLET DAILY BEFORE BREAKFAST (Patient  taking differently: Take 50 mcg by mouth daily before breakfast. Brand name Synthroid.)   sertraline (ZOLOFT) 25 MG tablet TAKE 1 TABLET DAILY (Patient taking differently: Take 25 mg by mouth in the morning.)   simvastatin (ZOCOR) 20 MG tablet TAKE 1 TABLET DAILY (Patient taking differently: Take 20 mg by mouth in the morning.)   SUMAtriptan (IMITREX) 100 MG tablet TAKE 1 TABLET EVERY 2 HOURS AS NEEDED FOR MIGRAINE. MAY REPEAT IN 2 HOURS IF HEADACHE PERSISTS OR RECURS (Patient taking differently: Take 100 mg by mouth See admin instructions. 100 mg at the onset of migraine. May repeat 100 mg in 2 hours if headache persists or recurs.)   zonisamide (ZONEGRAN) 100 MG capsule TAKE 1 CAPSULE DAILY   BETA CAROTENE PO Take 1 capsule by mouth in the morning. Vitamin A, unknown strength.   Multiple Vitamins-Minerals (ONE-A-DAY WOMENS 50 PLUS) TABS Take 1 tablet by mouth in the morning.   No facility-administered encounter medications on file as of 12/09/2022.    Allergies (verified) Codeine   History: Past Medical History:  Diagnosis Date   Depression    Phreesia 08/06/2020   Hx of migraines    Hyperglycemia    Hypothyroidism    Migraines    Multiple sclerosis (HCC)    Past Surgical History:  Procedure Laterality Date   CATARACT EXTRACTION W/PHACO Left 01/01/2020   Procedure: CATARACT EXTRACTION PHACO AND INTRAOCULAR  LENS PLACEMENT LEFT EYE;  Surgeon: Fabio Pierce, MD;  Location: [facility];  Service: Ophthalmology;  Laterality: Left;  CDE: 3.37   CATARACT EXTRACTION W/PHACO Right 01/19/2020   Procedure: CATARACT EXTRACTION PHACO AND INTRAOCULAR LENS PLACEMENT RIGHT EYE;  Surgeon: Fabio Pierce, MD;  Location: [facility];  Service: Ophthalmology;  Laterality: Right;  CDE: 3.16   TONSILLECTOMY     Family History  Problem Relation Age of Onset   Dementia Mother    Dementia Father    Social History   Socioeconomic History   Marital status: Widowed    Spouse name: Not on file   Number of  children: 0   Years of education: 69   Highest education level: Master's degree (e.g., MA, MS, MEng, MEd, MSW, MBA)  Occupational History   Not on file  Tobacco Use   Smoking status: Never    Passive exposure: Never   Smokeless tobacco: Never   Tobacco comments:    Lived with smoker  Vaping Use   Vaping Use: Never used  Substance and Sexual Activity   Alcohol use: Not Currently   Drug use: Never   Sexual activity: Not Currently    Partners: Male  Other Topics Concern   Not on file  Social History Narrative   ** Merged History Encounter ** Lives with her sister two story home stays on the first floor   Left handed   Caffeine none   Social Determinants of Health   Financial Resource Strain: Low Risk  (12/09/2022)   Overall Financial Resource Strain (CARDIA)    Difficulty of Paying Living Expenses: Not very hard  Food Insecurity: No Food Insecurity (12/09/2022)   Hunger Vital Sign    Worried About Running Out of Food in the Last Year: Never true    Ran Out of Food in the Last Year: Never true  Transportation Needs: No Transportation Needs (12/09/2022)   PRAPARE - Administrator, Civil Service (Medical): No    Lack of Transportation (Non-Medical): No  Physical Activity: Insufficiently Active (12/09/2022)   Exercise Vital Sign    Days of Exercise per Week: 2 days    Minutes of Exercise per Session: 30 min  Stress: No Stress Concern Present (12/09/2022)   [employer] of Occupational Health - Occupational Stress Questionnaire    Feeling of Stress : Only a little  Social Connections: Unknown (12/09/2022)   Social Connection and Isolation Panel [NHANES]    Frequency of Communication with Friends and Family: Once a week    Frequency of Social Gatherings with Friends and Family: Patient declined    Attends Religious Services: More than 4 times per year    Active Member of [employer] or Organizations: Yes    Attends Banker Meetings: More than 4 times per year     Marital Status: Widowed  Recent Concern: Social Connections - Moderately Isolated (10/01/2022)   Social Connection and Isolation Panel [NHANES]    Frequency of Communication with Friends and Family: Once a week    Frequency of Social Gatherings with Friends and Family: Once a week    Attends Religious Services: More than 4 times per year    Active Member of [employer] or Organizations: Yes    Attends Banker Meetings: More than 4 times per year    Marital Status: Widowed    Tobacco Counseling Counseling given: Yes Tobacco comments: Lived with smoker   Clinical Intake:  Pre-visit preparation completed: Yes  Pain : No/denies pain  BMI - recorded: 22.14 Nutritional Status: BMI of 19-24  Normal Nutritional Risks: None Diabetes: No  How often do you need to have someone help you when you read instructions, pamphlets, or other written materials from your doctor or pharmacy?: 1 - Never  Diabetic?No   Interpreter Needed?: No  Information entered by ::  Alayah Knouff, CMA   Activities of Daily Living    12/09/2022   12:29 PM 12/05/2022    9:40 AM  In your present state of health, do you have any difficulty performing the following activities:  Hearing? 1 1  Vision? 0 0  Difficulty concentrating or making decisions? 0 0  Walking or climbing stairs? 1 1  Dressing or bathing? 1 1  Doing errands, shopping? 1 1  Preparing Food and eating ? Y N  Comment has a caregiver that comes in to help once weekly but she is able to prepare her food.   Using the Toilet? N N  In the past six months, have you accidently leaked urine? N Y  Do you have problems with loss of bowel control? N Y  Managing your Medications? N N  Managing your Finances? N N  Housekeeping or managing your Housekeeping? Malvin Johns  Comment caregiver comes out weekly     Patient Care Team: Anabel Halon, MD as PCP - General (Internal Medicine) Patient, No Pcp Per (General Practice)  Indicate any recent  Medical Services you may have received from other than [facility] providers in the past year (date may be approximate).     Assessment:   This is a routine wellness examination for Craig.  Hearing/Vision screen Hearing Screening - Comments:: Patient wears hearing aids  Vision Screening - Comments:: Patient states they wear reading glasses only.    Dietary issues and exercise activities discussed: Current Exercise Habits: The patient does not participate in regular exercise at present, Exercise limited by: neurologic condition(s) (MS)   Goals Addressed             This Visit's Progress    Patient Stated       Patient states her goal is to reduce the number of falls she has.         Depression Screen    12/09/2022   12:23 PM 10/05/2022    1:35 PM 07/16/2022   10:54 AM 06/19/2022    1:18 PM 04/01/2022    1:07 PM 02/26/2022    2:07 PM 02/25/2022    2:08 PM  PHQ 2/9 Scores  PHQ - 2 Score 0 0 0 0 4  6  PHQ- 9 Score  0  0 10  9     Information is confidential and restricted. Go to Review Flowsheets to unlock data.    Fall Risk    12/09/2022   12:26 PM 12/05/2022    9:40 AM 10/05/2022    1:35 PM 07/20/2022    9:33 AM 07/16/2022   10:54 AM  Fall Risk   Falls in the past year? 1 1 0 1 1  Number falls in past yr: 1 1 0 1 0  Injury with Fall? 1 0 0 0 0  Risk for fall due to : History of fall(s);Impaired balance/gait;Impaired mobility;Other (Comment)  No Fall Risks History of fall(s)   Risk for fall due to: Comment patient has MS      Follow up Education provided;Falls prevention discussed  Falls evaluation completed Falls evaluation completed     FALL RISK PREVENTION PERTAINING TO THE HOME:  Any stairs in or around the home? No  If so, are there any without handrails? No  Home free of loose throw rugs in walkways, pet beds, electrical cords, etc? Yes  Adequate lighting in your home to reduce risk of falls? Yes   ASSISTIVE DEVICES UTILIZED TO PREVENT FALLS:  Life alert? No  Use  of a cane, walker or w/c? Yes  Grab bars in the bathroom? Yes  Shower chair or bench in shower? Yes  Elevated toilet seat or a handicapped toilet? Yes   TIMED UP AND GO:  Was the test performed? No .  Cognitive Function:    03/12/2021   10:46 AM  MMSE - Mini Mental State Exam  Orientation to time 5  Orientation to Place 5  Registration 3  Attention/ Calculation 5  Recall 3  Language- name 2 objects 2  Language- repeat 1  Language- follow 3 step command 3  Language- read & follow direction 1  Write a sentence 1  Copy design 1  Total score 30        12/09/2022   12:30 PM 11/24/2021    8:10 AM 11/18/2020    9:49 AM  6CIT Screen  What Year? 0 points 0 points 0 points  What month? 0 points 0 points 0 points  What time? 0 points 0 points 0 points  Count back from 20 0 points 0 points 0 points  Months in reverse 0 points 0 points 0 points  Repeat phrase 0 points 0 points 0 points  Total Score 0 points 0 points 0 points    Immunizations Immunization History  Administered Date(s) Administered   Moderna Sars-Covid-2 Vaccination 09/19/2019, 10/17/2019, 05/01/2020   PNEUMOCOCCAL CONJUGATE-20 07/16/2021    TDAP status: Due, Education has been provided regarding the importance of this vaccine. Advised may receive this vaccine at local pharmacy or Health Dept. Aware to provide a copy of the vaccination record if obtained from local pharmacy or Health Dept. Verbalized acceptance and understanding.  Flu Vaccine status: Up to date  Pneumococcal vaccine status: Up to date  Covid-19 vaccine status: Information provided on how to obtain vaccines.   Qualifies for Shingles Vaccine? Yes   Zostavax completed No   Shingrix Completed?: No.    Education has been provided regarding the importance of this vaccine. Patient has been advised to call insurance company to determine out of pocket expense if they have not yet received this vaccine. Advised may also receive vaccine at local pharmacy  or Health Dept. Verbalized acceptance and understanding.  Screening Tests Health Maintenance  Topic Date Due   DTaP/Tdap/Td (1 - Tdap) Never done   Zoster Vaccines- Shingrix (1 of 2) Never done   COVID-19 Vaccine (4 - 2023-24 season) 03/06/2022   INFLUENZA VACCINE  02/04/2023   Medicare Annual Wellness (AWV)  12/09/2023   MAMMOGRAM  12/30/2023   Fecal DNA (Cologuard)  08/01/2024   Pneumonia Vaccine 68+ Years old  Completed   DEXA SCAN  Completed   Hepatitis C Screening  Completed   HPV VACCINES  Aged Out   Colonoscopy  Discontinued    Health Maintenance  Health Maintenance Due  Topic Date Due   DTaP/Tdap/Td (1 - Tdap) Never done   Zoster Vaccines- Shingrix (1 of 2) Never done   COVID-19 Vaccine (4 - 2023-24 season) 03/06/2022    Colorectal cancer screening: Type of screening: Cologuard. Completed 08/01/2021. Repeat every 3 years  Mammogram status: Ordered 12/09/2022. Pt provided with contact info and advised to  call to schedule appt.   Bone Density status: Completed 12/29/2021. Results reflect: Bone density results: OSTEOPOROSIS. Repeat every 2 years.  Lung Cancer Screening: (Low Dose CT Chest recommended if Age 79-80 years, 30 pack-year currently smoking OR have quit w/in 15years.) does not qualify.    Additional Screening:  Hepatitis C Screening: does not qualify; Completed 07/16/2021  Vision Screening: Recommended annual ophthalmology exams for early detection of glaucoma and other disorders of the eye. Is the patient up to date with their annual eye exam?  No  Who is the provider or what is the name of the office in which the patient attends annual eye exams? [facility] If pt is not established with a provider, would they like to be referred to a provider to establish care? No .   Dental Screening: Recommended annual dental exams for proper oral hygiene  Community Resource Referral / Chronic Care Management: CRR required this visit?  No   CCM required this  visit?  No      Plan:     I have personally reviewed and noted the following in the patient's chart:   Medical and social history Use of alcohol, tobacco or illicit drugs  Current medications and supplements including opioid prescriptions. Patient is not currently taking opioid prescriptions. Functional ability and status Nutritional status Physical activity Advanced directives List of other physicians Hospitalizations, surgeries, and ER visits in previous 12 months Vitals Screenings to include cognitive, depression, and falls Referrals and appointments  In addition, I have reviewed and discussed with patient certain preventive protocols, quality metrics, and best practice recommendations. A written personalized care plan for preventive services as well as general preventive health recommendations were provided to patient.   Due to this being a telephonic visit, the after visit summary with patients personalized plan was offered to patient via mail or my-chart. Patient would like to access their AVS via my-chart    Jordan Hawks Tyronda Vizcarrondo, CMA   12/09/2022   Nurse Notes: Mammogram ordered

## 2022-12-09 NOTE — ED Triage Notes (Signed)
Pt states that on Sunday she was transferring from wheelchair to chair and slipped and fell onto fireplace, hit head, not LOC, has bruising to forehead and small lacerations to scalp, no bleeding, not on thinners. No other injuries

## 2022-12-09 NOTE — Patient Instructions (Signed)
Pamela Roman , Thank you for taking time to come for your Medicare Wellness Visit. I appreciate your ongoing commitment to your health goals. Please review the following plan we discussed and let me know if I can assist you in the future.   These are the goals we discussed:  Goals      DIET - INCREASE WATER INTAKE     Have 3 meals a day     Patient Stated     Staying active with personal trainer and the senior center      Patient Stated     Patient states her goal is to reduce the number of falls she has.       Prevent falls        This is a list of the screening recommended for you and due dates:  Health Maintenance  Topic Date Due   DTaP/Tdap/Td vaccine (1 - Tdap) Never done   Zoster (Shingles) Vaccine (1 of 2) Never done   COVID-19 Vaccine (4 - 2023-24 season) 03/06/2022   Flu Shot  02/04/2023   Medicare Annual Wellness Visit  12/09/2023   Mammogram  12/30/2023   Cologuard (Stool DNA test)  08/01/2024   Pneumonia Vaccine  Completed   DEXA scan (bone density measurement)  Completed   Hepatitis C Screening  Completed   HPV Vaccine  Aged Out   Colon Cancer Screening  Discontinued    Advanced directives: Advance directive discussed with you today. Even though you declined this today, please call our office should you change your mind, and we can give you the proper paperwork for you to fill out. Advance care planning is a way to make decisions about medical care that fits your values in case you are ever unable to make these decisions for yourself.  Information on Advanced Care Planning can be found at [facility] [place] Advance Health Care Directives Advance Health Care Directives ([email or web address]    Conditions/risks identified: Your mammogram has been ordered today. If you do not hear from them in a week, please call and schedule your appt.  Central Scheduling [phone]  Next appointment: Follow up in one year for your annual wellness visit December 29, 2023 at  8:30am telephone visit.    Preventive Care 67 Years and Older, Female Preventive care refers to lifestyle choices and visits with your health care provider that can promote health and wellness. What does preventive care include? A yearly physical exam. This is also called an annual well check. Dental exams once or twice a year. Routine eye exams. Ask your health care provider how often you should have your eyes checked. Personal lifestyle choices, including: Daily care of your teeth and gums. Regular physical activity. Eating a healthy diet. Avoiding tobacco and drug use. Limiting alcohol use. Practicing safe sex. Taking low-dose aspirin every day. Taking vitamin and mineral supplements as recommended by your health care provider. What happens during an annual well check? The services and screenings done by your health care provider during your annual well check will depend on your age, overall health, lifestyle risk factors, and family history of disease. Counseling  Your health care provider may ask you questions about your: Alcohol use. Tobacco use. Drug use. Emotional well-being. Home and relationship well-being. Sexual activity. Eating habits. History of falls. Memory and ability to understand (cognition). Work and work Astronomer. Reproductive health. Screening  You may have the following tests or measurements: Height, weight, and BMI. Blood pressure. Lipid and cholesterol levels.  These may be checked every 5 years, or more frequently if you are over 2 years old. Skin check. Lung cancer screening. You may have this screening every year starting at age 16 if you have a 30-pack-year history of smoking and currently smoke or have quit within the past 15 years. Fecal occult blood test (FOBT) of the stool. You may have this test every year starting at age 38. Flexible sigmoidoscopy or colonoscopy. You may have a sigmoidoscopy every 5 years or a colonoscopy every 10 years  starting at age 86. Hepatitis C blood test. Hepatitis B blood test. Sexually transmitted disease (STD) testing. Diabetes screening. This is done by checking your blood sugar (glucose) after you have not eaten for a while (fasting). You may have this done every 1-3 years. Bone density scan. This is done to screen for osteoporosis. You may have this done starting at age 37. Mammogram. This may be done every 1-2 years. Talk to your health care provider about how often you should have regular mammograms. Talk with your health care provider about your test results, treatment options, and if necessary, the need for more tests. Vaccines  Your health care provider may recommend certain vaccines, such as: Influenza vaccine. This is recommended every year. Tetanus, diphtheria, and acellular pertussis (Tdap, Td) vaccine. You may need a Td booster every 10 years. Zoster vaccine. You may need this after age 31. Pneumococcal 13-valent conjugate (PCV13) vaccine. One dose is recommended after age 53. Pneumococcal polysaccharide (PPSV23) vaccine. One dose is recommended after age 63. Talk to your health care provider about which screenings and vaccines you need and how often you need them. This information is not intended to replace advice given to you by your health care provider. Make sure you discuss any questions you have with your health care provider. Document Released: 07/19/2015 Document Revised: 03/11/2016 Document Reviewed: 04/23/2015 Elsevier Interactive Patient Education  2017 [employer].  Fall Prevention in the Home Falls can cause injuries. They can happen to people of all ages. There are many things you can do to make your home safe and to help prevent falls. What can I do on the outside of my home? Regularly fix the edges of walkways and driveways and fix any cracks. Remove anything that might make you trip as you walk through a door, such as a raised step or threshold. Trim any bushes or  trees on the path to your home. Use bright outdoor lighting. Clear any walking paths of anything that might make someone trip, such as rocks or tools. Regularly check to see if handrails are loose or broken. Make sure that both sides of any steps have handrails. Any raised decks and porches should have guardrails on the edges. Have any leaves, snow, or ice cleared regularly. Use sand or salt on walking paths during winter. Clean up any spills in your garage right away. This includes oil or grease spills. What can I do in the bathroom? Use night lights. Install grab bars by the toilet and in the tub and shower. Do not use towel bars as grab bars. Use non-skid mats or decals in the tub or shower. If you need to sit down in the shower, use a plastic, non-slip stool. Keep the floor dry. Clean up any water that spills on the floor as soon as it happens. Remove soap buildup in the tub or shower regularly. Attach bath mats securely with double-sided non-slip rug tape. Do not have throw rugs and other things on  the floor that can make you trip. What can I do in the bedroom? Use night lights. Make sure that you have a light by your bed that is easy to reach. Do not use any sheets or blankets that are too big for your bed. They should not hang down onto the floor. Have a firm chair that has side arms. You can use this for support while you get dressed. Do not have throw rugs and other things on the floor that can make you trip. What can I do in the kitchen? Clean up any spills right away. Avoid walking on wet floors. Keep items that you use a lot in easy-to-reach places. If you need to reach something above you, use a strong step stool that has a grab bar. Keep electrical cords out of the way. Do not use floor polish or wax that makes floors slippery. If you must use wax, use non-skid floor wax. Do not have throw rugs and other things on the floor that can make you trip. What can I do with my  stairs? Do not leave any items on the stairs. Make sure that there are handrails on both sides of the stairs and use them. Fix handrails that are broken or loose. Make sure that handrails are as long as the stairways. Check any carpeting to make sure that it is firmly attached to the stairs. Fix any carpet that is loose or worn. Avoid having throw rugs at the top or bottom of the stairs. If you do have throw rugs, attach them to the floor with carpet tape. Make sure that you have a light switch at the top of the stairs and the bottom of the stairs. If you do not have them, ask someone to add them for you. What else can I do to help prevent falls? Wear shoes that: Do not have high heels. Have rubber bottoms. Are comfortable and fit you well. Are closed at the toe. Do not wear sandals. If you use a stepladder: Make sure that it is fully opened. Do not climb a closed stepladder. Make sure that both sides of the stepladder are locked into place. Ask someone to hold it for you, if possible. Clearly mark and make sure that you can see: Any grab bars or handrails. First and last steps. Where the edge of each step is. Use tools that help you move around (mobility aids) if they are needed. These include: Canes. Walkers. Scooters. Crutches. Turn on the lights when you go into a dark area. Replace any light bulbs as soon as they burn out. Set up your furniture so you have a clear path. Avoid moving your furniture around. If any of your floors are uneven, fix them. If there are any pets around you, be aware of where they are. Review your medicines with your doctor. Some medicines can make you feel dizzy. This can increase your chance of falling. Ask your doctor what other things that you can do to help prevent falls. This information is not intended to replace advice given to you by your health care provider. Make sure you discuss any questions you have with your health care provider. Document  Released: 04/18/2009 Document Revised: 11/28/2015 Document Reviewed: 07/27/2014 Elsevier Interactive Patient Education  2017 [employer].

## 2022-12-09 NOTE — Telephone Encounter (Signed)
Advised patient if she starting to  have symptoms after her fall and her caregiver noticed some cuts on the head please go to the ED just a precaution.

## 2022-12-14 ENCOUNTER — Telehealth: Payer: Self-pay

## 2022-12-14 NOTE — Telephone Encounter (Signed)
Transition Care Management Follow-up Telephone Call Date of discharge and from where: [facility]  How have you been since you were released from the hospital? Fine, patient left the ED before being seen Any questions or concerns? No  Items Reviewed: Did the pt receive and understand the discharge instructions provided? No  Medications obtained and verified? No  Other? No  Any new allergies since your discharge? No  Dietary orders reviewed? No Do you have support at home? Yes     Follow up appointments reviewed:  PCP Hospital f/u appt confirmed? No  Scheduled to see  on  @ . Specialist Hospital f/u appt confirmed? No  Scheduled to see  on  @ . Are transportation arrangements needed? No  If their condition worsens, is the pt aware to call PCP or go to the Emergency Dept.? Yes Was the patient provided with contact information for the PCP's office or ED? Yes Was to pt encouraged to call back with questions or concerns? Yes

## 2022-12-14 NOTE — Telephone Encounter (Signed)
Transition Care Management Unsuccessful Follow-up Telephone Call  Date of discharge and from where:  [place] 6/4  Attempts:  1st Attempt  Reason for unsuccessful TCM follow-up call:  Left voice message    Alexander Aument [facility], [place] [phone] [address], [place], [place] [address] Phone: [phone] Email: [email or web address]       

## 2022-12-28 ENCOUNTER — Other Ambulatory Visit: Payer: Self-pay | Admitting: Internal Medicine

## 2023-01-05 ENCOUNTER — Telehealth: Payer: Self-pay | Admitting: Neurology

## 2023-01-05 DIAGNOSIS — G35 Multiple sclerosis: Secondary | ICD-10-CM

## 2023-01-05 NOTE — Telephone Encounter (Signed)
Patient advised of note 10/26/22: Leida Lauth, CMA at 10/26/2022 11:20 AM  Status: Signed  Patient advised she has labs that will need to be drawn two months and six months after starting the [facility].    Patient to have labs drawn at her PCP office close to home.

## 2023-01-05 NOTE — Telephone Encounter (Signed)
Patient said she has not had any blood work done or an MRI. She has an appt 01/18/23 but wasn't sure if jaffe wants to see her if she has not had either of those

## 2023-01-06 ENCOUNTER — Telehealth: Payer: Self-pay | Admitting: Neurology

## 2023-01-06 DIAGNOSIS — G35 Multiple sclerosis: Secondary | ICD-10-CM

## 2023-01-06 NOTE — Telephone Encounter (Signed)
Pt went to [facility] to get her blood work done and they do not have the orders so patient could not get her blood work done. She would like to speak to someone please call

## 2023-01-06 NOTE — Telephone Encounter (Signed)
Per DR.Jaffe, Yes I would still like MRI after 6 months from treatment.  Patient advised orders added.

## 2023-01-06 NOTE — Telephone Encounter (Signed)
Labs orders faxed to PCP office.

## 2023-01-07 LAB — CBC WITH DIFFERENTIAL
Basophils Absolute: 0 10*3/uL (ref 0.0–0.2)
Basos: 1 %
EOS (ABSOLUTE): 0.1 10*3/uL (ref 0.0–0.4)
Eos: 2 %
Hematocrit: 39.3 % (ref 34.0–46.6)
Hemoglobin: 12.9 g/dL (ref 11.1–15.9)
Immature Grans (Abs): 0 10*3/uL (ref 0.0–0.1)
Immature Granulocytes: 0 %
Lymphocytes Absolute: 0.6 10*3/uL — ABNORMAL LOW (ref 0.7–3.1)
Lymphs: 15 %
MCH: 30.4 pg (ref 26.6–33.0)
MCHC: 32.8 g/dL (ref 31.5–35.7)
MCV: 93 fL (ref 79–97)
Monocytes Absolute: 0.6 10*3/uL (ref 0.1–0.9)
Monocytes: 15 %
Neutrophils Absolute: 2.8 10*3/uL (ref 1.4–7.0)
Neutrophils: 67 %
RBC: 4.25 x10E6/uL (ref 3.77–5.28)
RDW: 12.1 % (ref 11.7–15.4)
WBC: 4.2 10*3/uL (ref 3.4–10.8)

## 2023-01-14 NOTE — Progress Notes (Signed)
NEUROLOGY FOLLOW UP OFFICE NOTE  Pamela Roman [id number]  Assessment/Plan:   Multiple sclerosis Migraine without aura, without status migrainosus, not intractable       1.  DMT:  Mavenclad 2.  Migraine prevention:  zonisamide 100mg  QD 3.  Migraine rescue:  Sumatriptan 100mg  4.  D3 5000 IU daily   5.  Repeat CBC with diff and vit D in 4 months 6.  Repeat MRI of brain/cervical/thoracic with and without contrast 6.  Follow up in 6 months.   Subjective:  Pamela Roman is a 67 year old left-handed white female who follows up for multiple sclerosis and assessment for power chair.  She is accompanied by her sister who supplements history.   UPDATE: Current DMT:  Mavenclad (started April 2024) Other current medications:  Sertraline 25mg  daily; sumatriptan 100mg  PRN; D 5000 IU daily; levothyroxine; Aleve BID for pain, zonisamide 100mg  every day  01/06/2023 CBC with WBC 4.2, HGB 12.9, HCT 39.3, PLT 286, ALC 0.6   Feeling better on Mavenclad. Vision:  If she looks at distance, she sees horizontal double vision.  Trouble writing and reading.  She had cataract surgery in 2021 and it didn't improve.  Likely related to INO. Motor: Sometimes may have increased weakness in the legs, but nothing as significant as with her flare ups.  Usually when she is hot.  Feet are weak.  She cannot separate the toes.   Sensory:  numb in legs. Gait:  wheelchair bound.   Bowel/Bladder:  Reports both bowel and bladder incontinence.  Fatigue:  yes Cognition:  Word finding issues.   Mood:  Worsening depression.  Seen in the ED at [facility] on 8/8 afater deliberately ingesting the full 90 day bottle of sertraline.  Does not feel suicidal.   Migraines:  On zonisamide.  Now infrequent  Aborts quickly with sumatriptan. On disability.  She does have home PT but reports she otherwise does not have home health care.     HISTORY: She was diagnosed with multiple sclerosis in the early 1990s after experiencing right  optic neuritis.  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  She underwent LP and MRIs, which were consistent with MS.  She initially started Betaseron and then Avonex until 2011, after which she was switched to Tysabri.  Within the last 5 years, she has had progression of disease, but particularly worse since moving here in March.  She has fallen 4 times.  She started using a walker about 5 years ago.     She had an MS flare in May 2021, describing increased difficulty walking and bilateral lower extremity numbness and tingling.  For paresthesias, she was started on gabapentin.  MRI of spinal cord showed acute lesions.  Started Mayzent in July 2021 but patient noted increased paresthesias in the legs.  She was prescribed high-dose prednisone.  In September 2023, she reported worsening bilateral leg weakness.  Home Solu-Medrol infusions were too expensive, so she received high-dose prednisone that seemed to work and was back to baseline.  On 12/4, her legs became very weak again and she couldn't move them. However, weakness resolved overnight.  The weakness returned on 12/9, for which she was admitted to [facility].  MRI of brain/cervical/thoracic with and without contrast on 12/11 revealed no new lesions in brain and cervical spine but did demonstrate new lesion at T12-L1 and enhancing lesion at T5-T6.  She received 3 day course of 1gm IV Solu-Medrol and discharged  to SNF.  She has chronic low back pain with vertebral fractures and osteoporosis.  NCV-EMG of lower extremities on 09/23/2021 showed bilateral chronic L3-4 radiculopathy.  MRI Lumbar spine on 11/15/2020 showed multilevel spondylosis with moderate spinal canal stenosis and severe bilateral lateral recess stenosis at L4-L5 as well as chronic compression deformities of T11, L1, and L2, including severe right foraminal stenosis at L2-3.   Declined referral to neurosurgery at that time.    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 She then was receiving Solu-Medrol every month.  Mayzent from June 2021-Jan 2024 (breakthrough flare) Other past medications: Gabapentin (weakness)   Imaging: 06/15/2022 MRI BRAIN W WO:  Findings consistent with the patient's history of multiple sclerosis. No new lesions are visualized in the brain 06/15/2022 MRI C-SPINE W WO:  No new lesions are visualized in the cervical spine.  No evidence of active demyelination in the cervical spine. 06/15/2022 MRI T-SPINE W WO:   Extensive demyelinating lesions throughout the thoracic spinal cord, with a new demyelinating lesion seen at the T12-L1 level. There may be a focal region of contrast enhancement at the T5-T6 level, which could suggest suggesting active demyelination. New acute to subacute superior endplate compression deformity at the T10 level. 12/31/2021 MRI BRAIN W WO:  1. Findings consistent with widespread chronic demyelinating disease/multiple sclerosis. Overall, disease burden is not significantly changed from previous, with no definite new lesions. No evidence for active demyelination on today's exam. 2. Underlying mildly advanced cerebral atrophy for age. 12/31/2021 MRI C-SPINE W WO:  1. No significant interval change in extensive patchy signal abnormality throughout the cervical spinal cord, consistent with history of chronic multiple sclerosis. No new lesions or significant disease progression. No evidence for active demyelination.  2. New mild chronic compression deformity at the superior endplate of T3.  3. Moderate spondylosis at C5-6 and C6-7 without significant stenosis. 12/31/2021 MRI T-SPINE W WO:  1. Diffuse patchy and hazy signal abnormality involving the majority of the thoracic cord, most pronounced at the level of T6-7. In comparison with prior exam, these changes are likely similar, although prior exam is degraded by  motion, consistent with history of chronic demyelinating disease/multiple sclerosis. Overall, appearance is similar to prior, without evidence for significant disease progression. No definite new lesions. No active demyelination. 2. New chronic compression deformities involving the T3, T5, and T6 vertebral bodies. Additional chronic compression fractures at T11 through L2 are stable. 09/16/2020 MRI BRAIN W WO:  stable with resolution of previous small active lesion 09/09/2020 MRI C & T-SPINE W WO:  1. Unchanged distribution of demyelinating lesions throughout the cervical spinal cord. Motion degraded images of the thoracic spine,but grossly unchanged. 2. No evidence of active demyelination within the cervical or thoracic spine.  04/14/2020 MRI BRAIN W WO:  subcentimeter focus of enhancement within the left posterior medulla, consistent with active demyelination. 12/23/2019 MRI CERVICAL SPINE W WO:  1. Extensive patchy T2 signal abnormality throughout the cervical spinal cord, consistent with demyelinating disease/multiple sclerosis. Faint patchy postcontrast enhancement about a few lesions.  AT C4 AND C7, consistent with active demyelination.  2. Mild multilevel cervical spondylosis without significant spinal stenosis. Mild bilateral C6 foraminal narrowing. 12/23/2019 MRI THORACIC SPINE W WO:  1. Patchy signal abnormality throughout much of the thoracic spinal cord, consistent with demyelinating disease/multiple sclerosis. No evidence for active demyelination within the thoracic spinal cord.  2. Acute  to subacute compression fracture extending through the inferior endplate of L2 with up to 40% height loss and 4 mm of bony retropulsion, incompletely assessed on this exam. Finding could be further evaluated with dedicated MRI of the lumbar spine as clinically desired.  3. Additional chronic lower thoracic compression fractures as above. 07/24/2019 MRI BRAIN W WO:  1.  Bilateral periventricular and radial  white matter hyperintensities involving the frontal parietal and temporal regions bilaterally stable compared to 04/25/19.  2.  Compared to prior study the white matter hyperintensities, T1 low signal foci associated with these, and the absence of contrast enhancement appears stable.  3.  No evidence of new plaques or contrast enhancement to suggest new inflammatory foci. 04/25/2019 MRI BRAIN W WO:  CEREBRUM:  Extensive multiple sclerosis plaques are visualized.  These is seen adjacent to the tmporal horns bilaterally extending to surround the atria and occipital horns bilaterally and extensively throughout the periventricular white matter bilateral.  These also extend into the bifrontal corona radiata and centrum semiovale.  No involvement of brainstem or cerebellum.  PAST MEDICAL HISTORY: Past Medical History:  Diagnosis Date   Depression    [employer] 08/06/2020   Hx of migraines    Hyperglycemia    Hypothyroidism    Migraines    Multiple sclerosis (HCC)     MEDICATIONS: Current Outpatient Medications on File Prior to Visit  Medication Sig Dispense Refill   alendronate (FOSAMAX) 70 MG tablet TAKE 1 TABLET ONCE A WEEK WITH A FULL GLASS OF WATER ON AN EMPTY STOMACH 12 tablet 3   BETA CAROTENE PO Take 1 capsule by mouth in the morning. Vitamin A, unknown strength.     Cholecalciferol (VITAMIN D3) 125 MCG (5000 UT) CAPS Take by mouth.     Cladribine, 10 Tabs, (MAVENCLAD, 10 TABS,) 10 MG TBPK      gabapentin (NEURONTIN) 100 MG capsule Take 200 mg by mouth in the morning and at bedtime.     levothyroxine (SYNTHROID) 50 MCG tablet TAKE 1 TABLET DAILY BEFORE BREAKFAST (Patient taking differently: Take 50 mcg by mouth daily before breakfast. Brand name Synthroid.) 90 tablet 3   Multiple Vitamins-Minerals (ONE-A-DAY WOMENS 50 PLUS) TABS Take 1 tablet by mouth in the morning.     sertraline (ZOLOFT) 25 MG tablet TAKE 1 TABLET DAILY (Patient taking differently: Take 25 mg by mouth in the morning.) 15  tablet 10   simvastatin (ZOCOR) 20 MG tablet TAKE 1 TABLET DAILY (Patient taking differently: Take 20 mg by mouth in the morning.) 90 tablet 3   SUMAtriptan (IMITREX) 100 MG tablet TAKE 1 TABLET EVERY 2 HOURS AS NEEDED FOR MIGRAINE. MAY REPEAT IN 2 HOURS IF HEADACHE PERSISTS OR RECURS (Patient taking differently: Take 100 mg by mouth See admin instructions. 100 mg at the onset of migraine. May repeat 100 mg in 2 hours if headache persists or recurs.) 27 tablet 0   zonisamide (ZONEGRAN) 100 MG capsule TAKE 1 CAPSULE DAILY 30 capsule 11   No current facility-administered medications on file prior to visit.    ALLERGIES: Allergies  Allergen Reactions   Codeine Nausea And Vomiting    FAMILY HISTORY: Family History  Problem Relation Age of Onset   Dementia Mother    Dementia Father       Objective:  Blood pressure 130/83, pulse 78, height 4\' 11"  (1.499 m), weight 131 lb (59.4 kg), SpO2 96%. General: No acute distress.  Patient appears well-groomed.   Head:  Normocephalic/atraumatic Eyes:  Fundi examined but  not visualized Neck: supple, no paraspinal tenderness, full range of motion Heart:  Regular rate and rhythm Back: No paraspinal tenderness Neurological Exam: Alert and oriented.  Speech fluent and not dysarthric.  Language intact.  Right APD.  Right INO.  Otherwise, CN II-XII intact.  Muscle strength 3+ bilateral hip flexion, 4+/5 right ankle dorsiflexion.  Sensation to pinprick and vibration slightly reduced in the right upper and lower extremities, reduced vibratory sensation in hands and feet.  Deep tendon reflexes 3+ on left, 2+ on right, Babinski present on left.  Finger to nose testing with trace ataxia.  Able to stand up independently from wheelchair.  Unable to walk independently.     Shon Millet, DO  CC: Trena Platt, MD

## 2023-01-18 ENCOUNTER — Ambulatory Visit (INDEPENDENT_AMBULATORY_CARE_PROVIDER_SITE_OTHER): Payer: Medicare Other | Admitting: Neurology

## 2023-01-18 ENCOUNTER — Encounter: Payer: Self-pay | Admitting: Neurology

## 2023-01-18 VITALS — BP 130/83 | HR 78 | Ht 59.0 in | Wt 131.0 lb

## 2023-01-18 DIAGNOSIS — E559 Vitamin D deficiency, unspecified: Secondary | ICD-10-CM | POA: Diagnosis not present

## 2023-01-18 DIAGNOSIS — G43009 Migraine without aura, not intractable, without status migrainosus: Secondary | ICD-10-CM

## 2023-01-18 DIAGNOSIS — G35 Multiple sclerosis: Secondary | ICD-10-CM

## 2023-01-18 NOTE — Patient Instructions (Addendum)
CONTINUE D3 5000 units daily, sumatriptan 100mg , zonisamide Check labs in 4 months (CBC with diff and vit D) Repeat MRI of brain/cervical/thoracic spine with and without contrast Refer to podiatry for toenail clippings Follow up in 6 months

## 2023-01-25 ENCOUNTER — Other Ambulatory Visit (HOSPITAL_COMMUNITY): Payer: Self-pay | Admitting: Psychiatry

## 2023-01-25 DIAGNOSIS — F332 Major depressive disorder, recurrent severe without psychotic features: Secondary | ICD-10-CM

## 2023-01-27 ENCOUNTER — Telehealth: Payer: Self-pay | Admitting: Neurology

## 2023-01-27 DIAGNOSIS — G35 Multiple sclerosis: Secondary | ICD-10-CM

## 2023-01-27 NOTE — Telephone Encounter (Signed)
Patient is calling concerning her MS. Patient had a meeting today to discus, patient is needing a referral for OT to come do a home safety eval. Pamela Roman

## 2023-01-28 NOTE — Telephone Encounter (Signed)
Per Dr.Jaffe, OK to order.  Order sent to [facility].

## 2023-02-03 NOTE — Progress Notes (Unsigned)
BH MD/PA/NP OP Progress Note  02/04/2023 9:51 AM Pamela Roman  MRN:  [id number]  Visit Diagnosis:    ICD-10-CM   1. Severe episode of recurrent major depressive disorder, without psychotic features (HCC)  F33.2     2. Multiple sclerosis (HCC)  G35        Assessment: KACELYNN AMYOT is a 67 y.o. female with a history of MDD who presented to [facility] for initial evaluation on 02/26/22 following a suicide attempt by intentional overdose on 02/10/22.   At initial evaluation patient reported that she had been struggling with depressed mood, fatigue, isolated, and hopelessness about her future leading up to the suicide attempt. She denied any prior self harm or thoughts of suicide prior to that along with and AVH, manic symptoms, paranoia, or feelings of being a burden. Of note patient has MS that has been progressing in severity which had in turn left her feeling isolated. Since discharging from the inpatient psychiatric facility patient reported feeling more hopeful, future oriented, less isolated (family in touch and visiting more), and motivated to make adjustments in her life so she can engage in the activities she enjoys. Patient denied any suicidality at time of initial evaluation and was able to engage in making a safety plan with several steps for if depression/suicidality were to progress.   Pamela Roman presents for follow-up evaluation. Today, 02/04/23, patient reports that her mood has remained upbeat since the medical hospitalization in December.  With continued improvement in mood over the past 8 months patient is interested in discontinuing medication.  As she is much more socially engaged and cognitively reframe her thinking we do feel it is appropriate to trial the discontinuation of Zoloft and monitor mood symptoms.  We will follow-up in 2 to 3 months.  Plan: - Discontinue Zoloft 25 mg, due to improvement in symptoms  - TSH, Vit D WNL - MRI brain and spine  reviewed, new demyelinating lesions in the the thoracic spine visualized - Crisis response plan discussed, along with crisis resources - Therapy/care coordination with [facility] once a week - Supportive interviewing techniques employed, patient working towards isolating less and connecting with more social supports - Follow up in 2-3 months   Chief Complaint:  Chief Complaint  Patient presents with   Follow-up   HPI: Pamela Roman presents reporting that she has been doing real well. Since December when she had the worst flare up she ever had and started new medication things have gone really well.   Last 5 months she has caregiver come by one day a week, [employer] 1 day a week, and senior center 1 day a week. But she plans to increase the time at the senior center. Going to church every other week as well and talking with her step children on the weeks she does not go. She is also staying socially active though not as much as she would like.   The depression has been good during this time. She denies experiencing any depression at all during this time. She feels really good about things overall and is feeling like she may not need the medication anymore. We discussed this, based off her cognitive improvements and increased behavioral activation we think a trial off of medications would be appropriate.    Past Psychiatric History: Patient reports that she had no prior psych history until 2021. At that time she was struggling with grief and depression following the passing of her husband and was  started on Zoloft 25 mg, which she continued up until she overdosed on 02/10/22. Following her inpatient admission to [facility] patient ws discharged on Zoloft 50 mg QD. She reported stopping the medication after her discharge as she was feeling better. Patient was restarted on Zoloft 25 mg for another year. She had reported continued improved mood over this time and it was decided to trial  tapering off medications to see if mood remained upbeat. She denies any trials of other psychotropic medications.   Past Medical History:  Past Medical History:  Diagnosis Date   Depression    Phreesia 08/06/2020   Hx of migraines    Hyperglycemia    Hypothyroidism    Migraines    Multiple sclerosis (HCC)     Past Surgical History:  Procedure Laterality Date   CATARACT EXTRACTION W/PHACO Left 01/01/2020   Procedure: CATARACT EXTRACTION PHACO AND INTRAOCULAR LENS PLACEMENT LEFT EYE;  Surgeon: Fabio Pierce, MD;  Location: [facility];  Service: Ophthalmology;  Laterality: Left;  CDE: 3.37   CATARACT EXTRACTION W/PHACO Right 01/19/2020   Procedure: CATARACT EXTRACTION PHACO AND INTRAOCULAR LENS PLACEMENT RIGHT EYE;  Surgeon: Fabio Pierce, MD;  Location: [facility];  Service: Ophthalmology;  Laterality: Right;  CDE: 3.16   TONSILLECTOMY      Family Psychiatric History: Denies  Family History:  Family History  Problem Relation Age of Onset   Dementia Mother    Dementia Father     Social History:  Social History   Socioeconomic History   Marital status: Widowed    Spouse name: Not on file   Number of children: 0   Years of education: 19   Highest education level: Master's degree (e.g., MA, MS, MEng, MEd, MSW, MBA)  Occupational History   Not on file  Tobacco Use   Smoking status: Never    Passive exposure: Never   Smokeless tobacco: Never   Tobacco comments:    Lived with smoker  Vaping Use   Vaping status: Never Used  Substance and Sexual Activity   Alcohol use: Not Currently   Drug use: Never   Sexual activity: Not Currently    Partners: Male  Other Topics Concern   Not on file  Social History Narrative   ** Merged History Encounter ** Lives with her sister two story home stays on the first floor   Left handed   Caffeine none   Social Determinants of Health   Financial Resource Strain: Low Risk  (12/09/2022)   Overall Financial Resource Strain (CARDIA)     Difficulty of Paying Living Expenses: Not very hard  Food Insecurity: No Food Insecurity (12/09/2022)   Hunger Vital Sign    Worried About Running Out of Food in the Last Year: Never true    Ran Out of Food in the Last Year: Never true  Transportation Needs: No Transportation Needs (12/09/2022)   PRAPARE - Administrator, Civil Service (Medical): No    Lack of Transportation (Non-Medical): No  Physical Activity: Insufficiently Active (12/09/2022)   Exercise Vital Sign    Days of Exercise per Week: 2 days    Minutes of Exercise per Session: 30 min  Stress: No Stress Concern Present (12/09/2022)   [employer] of Occupational Health - Occupational Stress Questionnaire    Feeling of Stress : Only a little  Social Connections: Unknown (12/09/2022)   Social Connection and Isolation Panel [NHANES]    Frequency of Communication with Friends and Family: Once a week  Frequency of Social Gatherings with Friends and Family: Patient declined    Attends Religious Services: More than 4 times per year    Active Member of Clubs or Organizations: Yes    Attends Banker Meetings: More than 4 times per year    Marital Status: Widowed  Recent Concern: Social Connections - Moderately Isolated (10/01/2022)   Social Connection and Isolation Panel [NHANES]    Frequency of Communication with Friends and Family: Once a week    Frequency of Social Gatherings with Friends and Family: Once a week    Attends Religious Services: More than 4 times per year    Active Member of [employer] or Organizations: Yes    Attends Banker Meetings: More than 4 times per year    Marital Status: Widowed    Allergies:  Allergies  Allergen Reactions   Codeine Nausea And Vomiting    Current Medications: Current Outpatient Medications  Medication Sig Dispense Refill   alendronate (FOSAMAX) 70 MG tablet TAKE 1 TABLET ONCE A WEEK WITH A FULL GLASS OF WATER ON AN EMPTY STOMACH 12 tablet 3   BETA  CAROTENE PO Take 1 capsule by mouth in the morning. Vitamin A, unknown strength.     Cholecalciferol (VITAMIN D3) 125 MCG (5000 UT) CAPS Take by mouth.     Cladribine, 10 Tabs, (MAVENCLAD, 10 TABS,) 10 MG TBPK      levothyroxine (SYNTHROID) 50 MCG tablet TAKE 1 TABLET DAILY BEFORE BREAKFAST (Patient taking differently: Take 50 mcg by mouth daily before breakfast. Brand name Synthroid.) 90 tablet 3   Multiple Vitamins-Minerals (ONE-A-DAY WOMENS 50 PLUS) TABS Take 1 tablet by mouth in the morning.     sertraline (ZOLOFT) 25 MG tablet TAKE 1 TABLET DAILY (Patient taking differently: Take 25 mg by mouth in the morning.) 15 tablet 10   simvastatin (ZOCOR) 20 MG tablet TAKE 1 TABLET DAILY (Patient taking differently: Take 20 mg by mouth in the morning.) 90 tablet 3   SUMAtriptan (IMITREX) 100 MG tablet TAKE 1 TABLET EVERY 2 HOURS AS NEEDED FOR MIGRAINE. MAY REPEAT IN 2 HOURS IF HEADACHE PERSISTS OR RECURS (Patient taking differently: Take 100 mg by mouth See admin instructions. 100 mg at the onset of migraine. May repeat 100 mg in 2 hours if headache persists or recurs.) 27 tablet 0   zonisamide (ZONEGRAN) 100 MG capsule TAKE 1 CAPSULE DAILY 30 capsule 11   No current facility-administered medications for this visit.     Psychiatric Specialty Exam: Review of Systems  There were no vitals taken for this visit.There is no height or weight on file to calculate BMI.  General Appearance: Well Groomed  Eye Contact:  Good  Speech:  Clear and Coherent  Volume:  Normal  Mood:  Euthymic  Affect:  Congruent  Thought Process:  Coherent and Goal Directed  Orientation:  Full (Time, Place, and Person)  Thought Content: Logical   Suicidal Thoughts:  No  Homicidal Thoughts:  No  Memory:  Immediate;   Good  Judgement:  Good  Insight:  Good  Psychomotor Activity:  NA  Concentration:  Concentration: Good  Recall:  Good  Fund of Knowledge: Good  Language: Good  Akathisia:  No    AIMS (if indicated): not  done  Assets:  Communication Skills Desire for Improvement  ADL's:  Intact  Cognition: WNL  Sleep:  Good   Metabolic Disorder Labs: Lab Results  Component Value Date   HGBA1C 5.7 (H) 07/16/2022   No  results found for: "PROLACTIN" Lab Results  Component Value Date   CHOL 218 (H) 07/16/2022   TRIG 128 07/16/2022   HDL 51 07/16/2022   CHOLHDL 4.3 07/16/2022   LDLCALC 144 (H) 07/16/2022   LDLCALC 123 (H) 11/06/2020   Lab Results  Component Value Date   TSH 2.190 07/16/2022   TSH 2.230 07/16/2021    Therapeutic Level Labs: No results found for: "LITHIUM" No results found for: "VALPROATE" No results found for: "CBMZ"   Screenings: GAD-7    Flowsheet Row Video Visit from 10/05/2022 in [facility] Office Visit from 06/19/2022 in [facility]  Total GAD-7 Score 0 0      Mini-Mental    Flowsheet Row Office Visit from 03/12/2021 in [facility]  Total Score (max 30 points ) 30      PHQ2-9    Flowsheet Row Clinical Support from 12/09/2022 in [name] Video Visit from 10/05/2022 in [facility] Office Visit from 07/16/2022 in [facility] Office Visit from 06/19/2022 in [facility] Primary Care Care Coordination from 04/01/2022 in [facility] Care Coordination  PHQ-2 Total Score 0 0 0 0 4  PHQ-9 Total Score -- 0 -- 0 10      Flowsheet Row ED from 12/09/2022 in [facility] Emergency Department at [facility] ED to Hosp-Admission (Discharged) from 06/14/2022 in [facility] Office Visit from 02/26/2022 [facility]  C-SSRS RISK CATEGORY No Risk No Risk High Risk       Collaboration of Care: Collaboration of Care: Primary Care Provider AEB chart review and Other provider involved in patient's care AEB ED chart review  Patient/Guardian was  advised Release of Information must be obtained prior to any record release in order to collaborate their care with an outside provider. Patient/Guardian was advised if they have not already done so to contact the registration department to sign all necessary forms in order for [place] to release information regarding their care.   Consent: Patient/Guardian gives verbal consent for treatment and assignment of benefits for services provided during this visit. Patient/Guardian expressed understanding and agreed to proceed.    Stasia Cavalier, MD 02/04/2023, 9:51 AM   Virtual Visit via Video Note  I connected with Pamela Roman on 02/04/23 at  9:30 AM EDT by a video enabled telemedicine application and verified that I am speaking with the correct person using two identifiers.  Location: Patient: Home Provider: Home Office   I discussed the limitations of evaluation and management by telemedicine and the availability of in person appointments. The patient expressed understanding and agreed to proceed.   I discussed the assessment and treatment plan with the patient. The patient was provided an opportunity to ask questions and all were answered. The patient agreed with the plan and demonstrated an understanding of the instructions.   The patient was advised to call back or seek an in-person evaluation if the symptoms worsen or if the condition fails to improve as anticipated.  I provided 20 minutes of non-face-to-face time during this encounter.   Stasia Cavalier, MD

## 2023-02-04 ENCOUNTER — Encounter (HOSPITAL_COMMUNITY): Payer: Self-pay | Admitting: Psychiatry

## 2023-02-04 ENCOUNTER — Telehealth (HOSPITAL_BASED_OUTPATIENT_CLINIC_OR_DEPARTMENT_OTHER): Payer: Medicare Other | Admitting: Psychiatry

## 2023-02-04 DIAGNOSIS — G35 Multiple sclerosis: Secondary | ICD-10-CM | POA: Diagnosis not present

## 2023-02-04 DIAGNOSIS — F332 Major depressive disorder, recurrent severe without psychotic features: Secondary | ICD-10-CM | POA: Diagnosis not present

## 2023-02-08 ENCOUNTER — Other Ambulatory Visit: Payer: Self-pay | Admitting: Internal Medicine

## 2023-02-10 ENCOUNTER — Telehealth: Payer: Self-pay

## 2023-02-10 NOTE — Telephone Encounter (Signed)
Call from [employer] from [place], Unable to do OT on it own for home health. Will need a verbal order for PT as well added.

## 2023-02-11 NOTE — Telephone Encounter (Signed)
Per patient she was denied for help due to not being bed bound.

## 2023-02-11 NOTE — Telephone Encounter (Signed)
Pt is calling in stating that the referral to [facility] is not able to assist her with what she is needing to have done she is not needing a PT or OT she is needing a company to come out to assist her with what changes she need to make in her house ex: change the bathroom and rearrange her bedroom.  Pt would like to have a call back.

## 2023-02-16 NOTE — Telephone Encounter (Signed)
Another referral sent to [facility] home health.

## 2023-02-17 ENCOUNTER — Telehealth: Payer: Self-pay | Admitting: Neurology

## 2023-02-17 NOTE — Telephone Encounter (Signed)
Caller stated she needs a referral for occupational therapist. Assessment of the bathroom. Stated she is having trouble getting in out out of shower

## 2023-02-17 NOTE — Telephone Encounter (Signed)
Patient was advised last week, Referral sent to [facility]. She advised it was denied.  Advised patient new referral sent. Waiting on response.

## 2023-02-18 ENCOUNTER — Telehealth: Payer: Self-pay | Admitting: Neurology

## 2023-02-18 NOTE — Telephone Encounter (Signed)
Dee with [facility] was called with verbal orders to delay pts start of care to Saturday 02/20/2023.

## 2023-02-18 NOTE — Telephone Encounter (Signed)
Dee w/[facility] is calling to verbal orders to delay pts start of care to Saturday 02/20/2023.

## 2023-02-22 ENCOUNTER — Encounter (HOSPITAL_COMMUNITY): Payer: Self-pay

## 2023-02-22 ENCOUNTER — Ambulatory Visit (HOSPITAL_COMMUNITY)
Admission: RE | Admit: 2023-02-22 | Discharge: 2023-02-22 | Disposition: A | Discharge status: Home / Self Care | Payer: Medicare Other | Source: Ambulatory Visit | Attending: Internal Medicine | Admitting: Internal Medicine

## 2023-02-22 DIAGNOSIS — Z0000 Encounter for general adult medical examination without abnormal findings: Secondary | ICD-10-CM | POA: Insufficient documentation

## 2023-02-22 DIAGNOSIS — Z1231 Encounter for screening mammogram for malignant neoplasm of breast: Secondary | ICD-10-CM | POA: Insufficient documentation

## 2023-02-26 ENCOUNTER — Other Ambulatory Visit: Payer: Self-pay | Admitting: Neurology

## 2023-02-26 MED ORDER — DIAZEPAM 5 MG PO TABS: ORAL_TABLET | ORAL | 0 refills | Status: DC

## 2023-02-26 NOTE — Telephone Encounter (Signed)
Patient is requesting medication to helo relax her for the 3 MRI's she has to have done

## 2023-02-26 NOTE — Telephone Encounter (Signed)
Patient advised, Per DR.Everlena Cooper, Prescription for diazepam sent to the [facility] in [place]

## 2023-03-10 ENCOUNTER — Other Ambulatory Visit: Payer: Self-pay | Admitting: Internal Medicine

## 2023-03-10 ENCOUNTER — Ambulatory Visit (HOSPITAL_COMMUNITY)
Admission: RE | Admit: 2023-03-10 | Discharge: 2023-03-10 | Disposition: A | Discharge status: Home / Self Care | Payer: Medicare Other | Source: Ambulatory Visit | Attending: Neurology | Admitting: Neurology

## 2023-03-10 ENCOUNTER — Other Ambulatory Visit (HOSPITAL_COMMUNITY): Payer: Self-pay

## 2023-03-10 DIAGNOSIS — G35 Multiple sclerosis: Secondary | ICD-10-CM | POA: Diagnosis present

## 2023-03-10 DIAGNOSIS — G35D Multiple sclerosis, unspecified: Secondary | ICD-10-CM

## 2023-03-10 MED ORDER — GADOBUTROL 1 MMOL/ML IV SOLN
6.0000 mL | Freq: Once | INTRAVENOUS | Status: AC | PRN
  Administered 2023-03-10: 6 mL via INTRAVENOUS

## 2023-03-10 MED ORDER — VITAMIN D3 125 MCG (5000 UT) PO CAPS
1.0000 | ORAL_CAPSULE | Freq: Every day | ORAL | 11 refills | Status: AC
  Filled 2023-03-10: qty 30, 30d supply, fill #0

## 2023-03-11 ENCOUNTER — Other Ambulatory Visit (HOSPITAL_COMMUNITY): Payer: Self-pay

## 2023-03-12 ENCOUNTER — Telehealth (HOSPITAL_COMMUNITY): Payer: Self-pay

## 2023-03-12 DIAGNOSIS — F332 Major depressive disorder, recurrent severe without psychotic features: Secondary | ICD-10-CM

## 2023-03-12 MED ORDER — SERTRALINE HCL 25 MG PO TABS
25.0000 mg | ORAL_TABLET | Freq: Every day | ORAL | 10 refills | Status: DC
Start: 2023-03-12 — End: 2023-04-30

## 2023-03-12 NOTE — Telephone Encounter (Signed)
Patient is calling to see if you would be willing to start her back on Sertraline. She said that she was taken off at her last visit but now feels like she still needs it. Please review and advise, thank you

## 2023-04-20 ENCOUNTER — Telehealth: Payer: Self-pay | Admitting: Neurology

## 2023-04-20 NOTE — Telephone Encounter (Signed)
Pt called in wanting to see if it's ok for her to have the next COVID vaccination with her being on [place]?

## 2023-04-21 NOTE — Telephone Encounter (Signed)
Patient advised per [facility], She can get the COVID-19 booster now.

## 2023-04-30 ENCOUNTER — Telehealth (HOSPITAL_COMMUNITY): Payer: Self-pay | Admitting: *Deleted

## 2023-04-30 DIAGNOSIS — F332 Major depressive disorder, recurrent severe without psychotic features: Secondary | ICD-10-CM

## 2023-04-30 MED ORDER — SERTRALINE HCL 25 MG PO TABS
25.0000 mg | ORAL_TABLET | Freq: Every day | ORAL | 1 refills | Status: DC
Start: 2023-04-30 — End: 2023-08-19

## 2023-04-30 NOTE — Telephone Encounter (Signed)
We received a fax from [facility] that pt is requesting presciption for Zoloft 25 mg be sent to them. Preferably #90 with 2 refills. The last script in EMR is for #15 with 10 refills to [facility]. Pt has an upcoming appointment on 05/07/23. Please review.

## 2023-04-30 NOTE — Telephone Encounter (Signed)
[facility] with 1 refill.

## 2023-05-07 ENCOUNTER — Telehealth (HOSPITAL_COMMUNITY): Payer: Medicare Other | Admitting: Psychiatry

## 2023-05-07 ENCOUNTER — Encounter (HOSPITAL_COMMUNITY): Payer: Self-pay | Admitting: Psychiatry

## 2023-05-07 DIAGNOSIS — G35 Multiple sclerosis: Secondary | ICD-10-CM

## 2023-05-07 DIAGNOSIS — F332 Major depressive disorder, recurrent severe without psychotic features: Secondary | ICD-10-CM | POA: Diagnosis not present

## 2023-05-07 DIAGNOSIS — G35D Multiple sclerosis, unspecified: Secondary | ICD-10-CM

## 2023-05-07 NOTE — Progress Notes (Signed)
BH MD/PA/NP OP Progress Note  05/07/2023 10:19 AM Pamela Roman  MRN:  [id number]  Visit Diagnosis:    ICD-10-CM   1. Severe episode of recurrent major depressive disorder, without psychotic features (HCC)  F33.2     2. Multiple sclerosis (HCC)  G35       Assessment: Pamela Roman is a 67 y.o. female with a history of MDD who presented to [facility] for initial evaluation on 02/26/22 following a suicide attempt by intentional overdose on 02/10/22.   At initial evaluation patient reported that she had been struggling with depressed mood, fatigue, isolated, and hopelessness about her future leading up to the suicide attempt. She denied any prior self harm or thoughts of suicide prior to that along with and AVH, manic symptoms, paranoia, or feelings of being a burden. Of note patient has MS that has been progressing in severity which had in turn left her feeling isolated. Since discharging from the inpatient psychiatric facility patient reported feeling more hopeful, future oriented, less isolated (family in touch and visiting more), and motivated to make adjustments in her life so she can engage in the activities she enjoys. Patient denied any suicidality at time of initial evaluation and was able to engage in making a safety plan with several steps for if depression/suicidality were to progress.   Pamela Roman presents for follow-up evaluation. Today, 05/07/23, patient reports doing well.  She had reached out in the interim requesting to restart on sertraline.  She denied any onset of anxiety or depression off the medication though had felt like her mood was a bit less stable.  She also had felt more secure with the medication on board.  We will restart sertraline at 25 mg daily and follow up in 3 months.  Plan: - Restart Zoloft 25 mg QD - TSH, Vit D WNL - MRI brain and spine reviewed, new demyelinating lesions in the the thoracic spine visualized - Crisis response plan  discussed, along with crisis resources - Therapy/care coordination with Pamela Roman once a week - Supportive interviewing techniques employed, patient working towards isolating less and connecting with more social supports - Follow up in 3 months   Chief Complaint:  Chief Complaint  Patient presents with   Follow-up   HPI: Pamela Roman presents reporting that she is doing ok. Her caregiver comes twice a week now, they have started working on some exercises at home which has been helpful. Patient has also been going to the senior center twice a week. Pamela Roman had called in the interim about getting back on the Zoloft. She denies any onset of anxiety or depression off the medication, but felt more comfortable while taking it. Feels like she is more level with the medication and would like to continue it going forward.  Past Psychiatric History: Patient reports that she had no prior psych history until 2021. At that time she was struggling with grief and depression following the passing of her husband and was started on Zoloft 25 mg, which she continued up until she overdosed on 02/10/22. Following her inpatient admission to [facility] patient ws discharged on Zoloft 50 mg QD. She reported stopping the medication after her discharge as she was feeling better. Patient was restarted on Zoloft 25 mg for another year. She had reported continued improved mood over this time and it was decided to trial tapering off medications to see if mood remained upbeat. She denies any trials of other psychotropic medications.  Past Medical History:  Past Medical History:  Diagnosis Date   Depression    Phreesia 08/06/2020   Hx of migraines    Hyperglycemia    Hypothyroidism    Migraines    Multiple sclerosis (HCC)     Past Surgical History:  Procedure Laterality Date   CATARACT EXTRACTION W/PHACO Left 01/01/2020   Procedure: CATARACT EXTRACTION PHACO AND INTRAOCULAR LENS PLACEMENT LEFT EYE;   Surgeon: Fabio Pierce, MD;  Location: [facility];  Service: Ophthalmology;  Laterality: Left;  CDE: 3.37   CATARACT EXTRACTION W/PHACO Right 01/19/2020   Procedure: CATARACT EXTRACTION PHACO AND INTRAOCULAR LENS PLACEMENT RIGHT EYE;  Surgeon: Fabio Pierce, MD;  Location: [facility];  Service: Ophthalmology;  Laterality: Right;  CDE: 3.16   TONSILLECTOMY      Family Psychiatric History: Denies  Family History:  Family History  Problem Relation Age of Onset   Dementia Mother    Dementia Father     Social History:  Social History   Socioeconomic History   Marital status: Widowed    Spouse name: Not on file   Number of children: 0   Years of education: 58   Highest education level: Master's degree (e.g., MA, MS, MEng, MEd, MSW, MBA)  Occupational History   Not on file  Tobacco Use   Smoking status: Never    Passive exposure: Never   Smokeless tobacco: Never   Tobacco comments:    Lived with smoker  Vaping Use   Vaping status: Never Used  Substance and Sexual Activity   Alcohol use: Not Currently   Drug use: Never   Sexual activity: Not Currently    Partners: Male  Other Topics Concern   Not on file  Social History Narrative   ** Merged History Encounter ** Lives with her sister two story home stays on the first floor   Left handed   Caffeine none   Social Determinants of Health   Financial Resource Strain: Low Risk  (12/09/2022)   Overall Financial Resource Strain (CARDIA)    Difficulty of Paying Living Expenses: Not very hard  Food Insecurity: No Food Insecurity (12/09/2022)   Hunger Vital Sign    Worried About Running Out of Food in the Last Year: Never true    Ran Out of Food in the Last Year: Never true  Transportation Needs: No Transportation Needs (12/09/2022)   PRAPARE - Administrator, Civil Service (Medical): No    Lack of Transportation (Non-Medical): No  Physical Activity: Insufficiently Active (12/09/2022)   Exercise Vital Sign    Days of Exercise  per Week: 2 days    Minutes of Exercise per Session: 30 min  Stress: No Stress Concern Present (12/09/2022)   [employer] of Occupational Health - Occupational Stress Questionnaire    Feeling of Stress : Only a little  Social Connections: Unknown (12/09/2022)   Social Connection and Isolation Panel [NHANES]    Frequency of Communication with Friends and Family: Once a week    Frequency of Social Gatherings with Friends and Family: Patient declined    Attends Religious Services: More than 4 times per year    Active Member of [employer] or Organizations: Yes    Attends Banker Meetings: More than 4 times per year    Marital Status: Widowed  Recent Concern: Social Connections - Moderately Isolated (10/01/2022)   Social Connection and Isolation Panel [NHANES]    Frequency of Communication with Friends and Family: Once a week  Frequency of Social Gatherings with Friends and Family: Once a week    Attends Religious Services: More than 4 times per year    Active Member of [employer] or Organizations: Yes    Attends Banker Meetings: More than 4 times per year    Marital Status: Widowed    Allergies:  Allergies  Allergen Reactions   Codeine Nausea And Vomiting    Current Medications: Current Outpatient Medications  Medication Sig Dispense Refill   alendronate (FOSAMAX) 70 MG tablet TAKE 1 TABLET ONCE A WEEK WITH A FULL GLASS OF WATER ON AN EMPTY STOMACH 12 tablet 3   BETA CAROTENE PO Take 1 capsule by mouth in the morning. Vitamin A, unknown strength.     Cholecalciferol (VITAMIN D3) 125 MCG (5000 UT) CAPS Take 1 capsule (5,000 Units total) by mouth daily. 30 capsule 11   Cladribine, 10 Tabs, (MAVENCLAD, 10 TABS,) 10 MG TBPK      diazepam (VALIUM) 5 MG tablet TAKE 1 TABLET BY MOUTH 30 TO 40 MINUTES BEFORE MRI 2 tablet 0   levothyroxine (SYNTHROID) 50 MCG tablet TAKE 1 TABLET DAILY BEFORE BREAKFAST 90 tablet 3   Multiple Vitamins-Minerals (ONE-A-DAY WOMENS 50 PLUS)  TABS Take 1 tablet by mouth in the morning.     sertraline (ZOLOFT) 25 MG tablet Take 1 tablet (25 mg total) by mouth daily. 90 tablet 1   simvastatin (ZOCOR) 20 MG tablet TAKE 1 TABLET DAILY 90 tablet 3   SUMAtriptan (IMITREX) 100 MG tablet TAKE 1 TABLET EVERY 2 HOURS AS NEEDED FOR MIGRAINE. MAY REPEAT IN 2 HOURS IF HEADACHE PERSISTS OR RECURS (Patient taking differently: Take 100 mg by mouth See admin instructions. 100 mg at the onset of migraine. May repeat 100 mg in 2 hours if headache persists or recurs.) 27 tablet 0   zonisamide (ZONEGRAN) 100 MG capsule TAKE 1 CAPSULE DAILY 30 capsule 11   No current facility-administered medications for this visit.     Psychiatric Specialty Exam: Review of Systems  There were no vitals taken for this visit.There is no height or weight on file to calculate BMI.  General Appearance: Well Groomed  Eye Contact:  Good  Speech:  Clear and Coherent  Volume:  Normal  Mood:  Euthymic  Affect:  Congruent  Thought Process:  Coherent and Goal Directed  Orientation:  Full (Time, Place, and Person)  Thought Content: Logical   Suicidal Thoughts:  No  Homicidal Thoughts:  No  Memory:  Immediate;   Good  Judgement:  Good  Insight:  Good  Psychomotor Activity:  NA  Concentration:  Concentration: Good  Recall:  Good  Fund of Knowledge: Good  Language: Good  Akathisia:  No    AIMS (if indicated): not done  Assets:  Communication Skills Desire for Improvement  ADL's:  Intact  Cognition: WNL  Sleep:  Good   Metabolic Disorder Labs: Lab Results  Component Value Date   HGBA1C 5.7 (H) 07/16/2022   No results found for: "PROLACTIN" Lab Results  Component Value Date   CHOL 218 (H) 07/16/2022   TRIG 128 07/16/2022   HDL 51 07/16/2022   CHOLHDL 4.3 07/16/2022   LDLCALC 144 (H) 07/16/2022   LDLCALC 123 (H) 11/06/2020   Lab Results  Component Value Date   TSH 2.190 07/16/2022   TSH 2.230 07/16/2021    Therapeutic Level Labs: No results found  for: "LITHIUM" No results found for: "VALPROATE" No results found for: "CBMZ"   Screenings: GAD-7  Flowsheet Row Video Visit from 10/05/2022 in [facility] Office Visit from 06/19/2022 in [facility]  Total GAD-7 Score 0 0      Mini-Mental    Flowsheet Row Office Visit from 03/12/2021 in [facility]  Total Score (max 30 points ) 30      PHQ2-9    Flowsheet Row Clinical Support from 12/09/2022 in [facility] Video Visit from 10/05/2022 in [facility] Office Visit from 07/16/2022 in [facility] Office Visit from 06/19/2022 in [facility] Care Coordination from 04/01/2022 in [facility] Coordination  PHQ-2 Total Score 0 0 0 0 4  PHQ-9 Total Score -- 0 -- 0 10      Flowsheet Row ED from 12/09/2022 in [facility] ED to Hosp-Admission (Discharged) from 06/14/2022 in [facility] Office Visit from 02/26/2022 in [facility]  C-SSRS RISK CATEGORY No Risk No Risk High Risk       Collaboration of Care: Collaboration of Care: Primary Care Provider AEB chart review and Other provider involved in patient's care AEB ED chart review  Patient/Guardian was advised Release of Information must be obtained prior to any record release in order to collaborate their care with an outside provider. Patient/Guardian was advised if they have not already done so to contact the registration department to sign all necessary forms in order for [place] to release information regarding their care.   Consent: Patient/Guardian gives verbal consent for treatment and assignment of benefits for services provided during this visit. Patient/Guardian expressed understanding and agreed to proceed.    Stasia Cavalier, MD 05/07/2023, 10:19  AM   Virtual Visit via Video Note  I connected with Pamela Roman on 05/07/23 at 10:00 AM EDT by a video enabled telemedicine application and verified that I am speaking with the correct person using two identifiers.  Location: Patient: Home Provider: Home Office   I discussed the limitations of evaluation and management by telemedicine and the availability of in person appointments. The patient expressed understanding and agreed to proceed.   I discussed the assessment and treatment plan with the patient. The patient was provided an opportunity to ask questions and all were answered. The patient agreed with the plan and demonstrated an understanding of the instructions.   The patient was advised to call back or seek an in-person evaluation if the symptoms worsen or if the condition fails to improve as anticipated.  I provided 20 minutes of non-face-to-face time during this encounter.   Stasia Cavalier, MD

## 2023-05-14 ENCOUNTER — Telehealth: Payer: Self-pay | Admitting: Neurology

## 2023-05-14 NOTE — Telephone Encounter (Signed)
Tried calling patient --no answer

## 2023-05-14 NOTE — Telephone Encounter (Signed)
Pt called in stating she got the COVID shot on Wednesday and she has fallen Thursday and Friday. She has had to call for help both days. She is not sure if it is due to the COVID shot or not. She thinks she might not have anymore vaccinations.

## 2023-05-18 ENCOUNTER — Telehealth: Payer: Self-pay | Admitting: Neurology

## 2023-05-18 NOTE — Telephone Encounter (Signed)
Called Pamela Roman and she just want to let us know that she received a Covid shot on Wednesday and started feeling weak Thursday night and Friday she could not do any thing on her on until Saturday she felt better and today Tuesday she is feeling normal but she did fall and got up all by herself with no injuries. She thinks she had a reaction to the shot. And will not take them any more.

## 2023-05-18 NOTE — Telephone Encounter (Signed)
Pt had a Covid injection on Wednesday. She thinks that she may have had reaction to the injection. She states that on Thursday and Friday that she fell and was having trouble standing. She also states that she was having swelling and bruising in the feet. She did not know if this was something that was normal for a patient that has MS please call

## 2023-05-18 NOTE — Telephone Encounter (Signed)
LMOVM for patient, Please call the office back.

## 2023-07-12 ENCOUNTER — Encounter (HOSPITAL_COMMUNITY): Payer: Self-pay | Admitting: *Deleted

## 2023-07-12 ENCOUNTER — Inpatient Hospital Stay (HOSPITAL_COMMUNITY)
Admission: EM | Admit: 2023-07-12 | Discharge: 2023-07-19 | DRG: 193 | Disposition: A | Discharge status: Skilled Nursing Facility | Payer: Medicare Other | Attending: Family Medicine | Admitting: Family Medicine

## 2023-07-12 ENCOUNTER — Emergency Department (HOSPITAL_COMMUNITY): Payer: Medicare Other

## 2023-07-12 ENCOUNTER — Other Ambulatory Visit: Payer: Self-pay

## 2023-07-12 DIAGNOSIS — M810 Age-related osteoporosis without current pathological fracture: Secondary | ICD-10-CM | POA: Diagnosis present

## 2023-07-12 DIAGNOSIS — G9341 Metabolic encephalopathy: Secondary | ICD-10-CM | POA: Diagnosis present

## 2023-07-12 DIAGNOSIS — R5381 Other malaise: Secondary | ICD-10-CM | POA: Diagnosis present

## 2023-07-12 DIAGNOSIS — R296 Repeated falls: Secondary | ICD-10-CM | POA: Diagnosis present

## 2023-07-12 DIAGNOSIS — R531 Weakness: Secondary | ICD-10-CM | POA: Diagnosis not present

## 2023-07-12 DIAGNOSIS — Z9841 Cataract extraction status, right eye: Secondary | ICD-10-CM

## 2023-07-12 DIAGNOSIS — R29898 Other symptoms and signs involving the musculoskeletal system: Secondary | ICD-10-CM | POA: Diagnosis not present

## 2023-07-12 DIAGNOSIS — Z7983 Long term (current) use of bisphosphonates: Secondary | ICD-10-CM

## 2023-07-12 DIAGNOSIS — E785 Hyperlipidemia, unspecified: Secondary | ICD-10-CM | POA: Diagnosis present

## 2023-07-12 DIAGNOSIS — R54 Age-related physical debility: Secondary | ICD-10-CM | POA: Diagnosis present

## 2023-07-12 DIAGNOSIS — R509 Fever, unspecified: Secondary | ICD-10-CM | POA: Diagnosis present

## 2023-07-12 DIAGNOSIS — R4182 Altered mental status, unspecified: Secondary | ICD-10-CM | POA: Diagnosis present

## 2023-07-12 DIAGNOSIS — Z885 Allergy status to narcotic agent status: Secondary | ICD-10-CM | POA: Diagnosis not present

## 2023-07-12 DIAGNOSIS — Z79899 Other long term (current) drug therapy: Secondary | ICD-10-CM

## 2023-07-12 DIAGNOSIS — Z961 Presence of intraocular lens: Secondary | ICD-10-CM | POA: Diagnosis present

## 2023-07-12 DIAGNOSIS — Z9842 Cataract extraction status, left eye: Secondary | ICD-10-CM | POA: Diagnosis not present

## 2023-07-12 DIAGNOSIS — G35 Multiple sclerosis: Secondary | ICD-10-CM | POA: Diagnosis present

## 2023-07-12 DIAGNOSIS — E039 Hypothyroidism, unspecified: Secondary | ICD-10-CM | POA: Diagnosis present

## 2023-07-12 DIAGNOSIS — D849 Immunodeficiency, unspecified: Secondary | ICD-10-CM | POA: Diagnosis present

## 2023-07-12 DIAGNOSIS — Z79890 Hormone replacement therapy (postmenopausal): Secondary | ICD-10-CM | POA: Diagnosis not present

## 2023-07-12 DIAGNOSIS — J101 Influenza due to other identified influenza virus with other respiratory manifestations: Secondary | ICD-10-CM | POA: Diagnosis present

## 2023-07-12 DIAGNOSIS — J111 Influenza due to unidentified influenza virus with other respiratory manifestations: Principal | ICD-10-CM

## 2023-07-12 DIAGNOSIS — Z7722 Contact with and (suspected) exposure to environmental tobacco smoke (acute) (chronic): Secondary | ICD-10-CM | POA: Diagnosis present

## 2023-07-12 LAB — COMPREHENSIVE METABOLIC PANEL
ALT: 15 U/L (ref 0–44)
AST: 22 U/L (ref 15–41)
Albumin: 4 g/dL (ref 3.5–5.0)
Alkaline Phosphatase: 79 U/L (ref 38–126)
Anion gap: 8 (ref 5–15)
BUN: 22 mg/dL (ref 8–23)
CO2: 21 mmol/L — ABNORMAL LOW (ref 22–32)
Calcium: 8.6 mg/dL — ABNORMAL LOW (ref 8.9–10.3)
Chloride: 106 mmol/L (ref 98–111)
Creatinine, Ser: 1.03 mg/dL — ABNORMAL HIGH (ref 0.44–1.00)
GFR, Estimated: 60 mL/min — ABNORMAL LOW (ref >60)
Glucose, Bld: 113 mg/dL — ABNORMAL HIGH (ref 70–99)
Potassium: 3.5 mmol/L (ref 3.5–5.1)
Sodium: 135 mmol/L (ref 135–145)
Total Bilirubin: 0.8 mg/dL (ref 0.0–1.2)
Total Protein: 7 g/dL (ref 6.5–8.1)

## 2023-07-12 LAB — RESP PANEL BY RT-PCR (RSV, FLU A&B, COVID)  RVPGX2
Influenza A by PCR: POSITIVE — AB
Influenza B by PCR: NEGATIVE
Resp Syncytial Virus by PCR: NEGATIVE
SARS Coronavirus 2 by RT PCR: NEGATIVE

## 2023-07-12 LAB — CBC WITH DIFFERENTIAL/PLATELET
Abs Immature Granulocytes: 0.02 10*3/uL (ref 0.00–0.07)
Basophils Absolute: 0 10*3/uL (ref 0.0–0.1)
Basophils Relative: 0 %
Eosinophils Absolute: 0 10*3/uL (ref 0.0–0.5)
Eosinophils Relative: 0 %
HCT: 43.7 % (ref 36.0–46.0)
Hemoglobin: 14.2 g/dL (ref 12.0–15.0)
Immature Granulocytes: 0 %
Lymphocytes Relative: 4 %
Lymphs Abs: 0.3 10*3/uL — ABNORMAL LOW (ref 0.7–4.0)
MCH: 30.1 pg (ref 26.0–34.0)
MCHC: 32.5 g/dL (ref 30.0–36.0)
MCV: 92.6 fL (ref 80.0–100.0)
Monocytes Absolute: 0.8 10*3/uL (ref 0.1–1.0)
Monocytes Relative: 11 %
Neutro Abs: 6.1 10*3/uL (ref 1.7–7.7)
Neutrophils Relative %: 85 %
Platelets: 173 10*3/uL (ref 150–400)
RBC: 4.72 MIL/uL (ref 3.87–5.11)
RDW: 13 % (ref 11.5–15.5)
WBC: 7.2 10*3/uL (ref 4.0–10.5)
nRBC: 0 % (ref 0.0–0.2)

## 2023-07-12 LAB — URINALYSIS, ROUTINE W REFLEX MICROSCOPIC
Bacteria, UA: NONE SEEN
Bilirubin Urine: NEGATIVE
Glucose, UA: NEGATIVE mg/dL
Ketones, ur: 80 mg/dL — AB
Leukocytes,Ua: NEGATIVE
Nitrite: NEGATIVE
Protein, ur: NEGATIVE mg/dL
Specific Gravity, Urine: 1.033 — ABNORMAL HIGH (ref 1.005–1.030)
pH: 5 (ref 5.0–8.0)

## 2023-07-12 LAB — TSH: TSH: 0.621 u[IU]/mL (ref 0.350–4.500)

## 2023-07-12 MED ORDER — ALBUTEROL SULFATE (2.5 MG/3ML) 0.083% IN NEBU: 2.5000 mg | INHALATION_SOLUTION | RESPIRATORY_TRACT | Status: DC | PRN

## 2023-07-12 MED ORDER — SUMATRIPTAN SUCCINATE 100 MG PO TABS: 100.0000 mg | ORAL_TABLET | ORAL | Status: DC

## 2023-07-12 MED ORDER — ONDANSETRON HCL 4 MG PO TABS: 4.0000 mg | ORAL_TABLET | Freq: Four times a day (QID) | ORAL | Status: DC | PRN

## 2023-07-12 MED ORDER — FAMOTIDINE 20 MG PO TABS
20.0000 mg | ORAL_TABLET | Freq: Every day | ORAL | Status: DC
  Administered 2023-07-12 – 2023-07-18 (×7): 20 mg via ORAL
  Filled 2023-07-12 (×7): qty 1

## 2023-07-12 MED ORDER — ACETAMINOPHEN 325 MG PO TABS
650.0000 mg | ORAL_TABLET | Freq: Three times a day (TID) | ORAL | Status: DC
  Administered 2023-07-12 – 2023-07-19 (×21): 650 mg via ORAL
  Filled 2023-07-12 (×22): qty 2

## 2023-07-12 MED ORDER — ONDANSETRON HCL 4 MG/2ML IJ SOLN
4.0000 mg | Freq: Four times a day (QID) | INTRAMUSCULAR | Status: DC | PRN
  Administered 2023-07-12: 4 mg via INTRAVENOUS
  Filled 2023-07-12: qty 2

## 2023-07-12 MED ORDER — BISACODYL 5 MG PO TBEC: 5.0000 mg | DELAYED_RELEASE_TABLET | Freq: Every day | ORAL | Status: DC | PRN

## 2023-07-12 MED ORDER — SODIUM CHLORIDE 0.9 % IV SOLN: INTRAVENOUS | Status: DC

## 2023-07-12 MED ORDER — SUMATRIPTAN SUCCINATE 50 MG PO TABS: 100.0000 mg | ORAL_TABLET | ORAL | Status: DC | PRN

## 2023-07-12 MED ORDER — ENOXAPARIN SODIUM 40 MG/0.4ML IJ SOSY
40.0000 mg | PREFILLED_SYRINGE | INTRAMUSCULAR | Status: DC
  Administered 2023-07-12 – 2023-07-18 (×7): 40 mg via SUBCUTANEOUS
  Filled 2023-07-12 (×7): qty 0.4

## 2023-07-12 MED ORDER — SIMVASTATIN 20 MG PO TABS
20.0000 mg | ORAL_TABLET | Freq: Every day | ORAL | Status: DC
  Administered 2023-07-12 – 2023-07-18 (×7): 20 mg via ORAL
  Filled 2023-07-12 (×3): qty 1
  Filled 2023-07-12: qty 2
  Filled 2023-07-12 (×3): qty 1

## 2023-07-12 MED ORDER — VITAMIN D 25 MCG (1000 UNIT) PO TABS
5000.0000 [IU] | ORAL_TABLET | Freq: Every day | ORAL | Status: DC
  Administered 2023-07-13 – 2023-07-19 (×7): 5000 [IU] via ORAL
  Filled 2023-07-12 (×7): qty 5

## 2023-07-12 MED ORDER — OSELTAMIVIR PHOSPHATE 30 MG PO CAPS
30.0000 mg | ORAL_CAPSULE | Freq: Two times a day (BID) | ORAL | Status: AC
  Administered 2023-07-12 – 2023-07-16 (×10): 30 mg via ORAL
  Filled 2023-07-12 (×10): qty 1

## 2023-07-12 MED ORDER — DEXTROMETHORPHAN POLISTIREX ER 30 MG/5ML PO SUER: 30.0000 mg | Freq: Two times a day (BID) | ORAL | Status: DC | PRN

## 2023-07-12 MED ORDER — GADOBUTROL 1 MMOL/ML IV SOLN
6.0000 mL | Freq: Once | INTRAVENOUS | Status: AC | PRN
  Administered 2023-07-12: 6 mL via INTRAVENOUS

## 2023-07-12 MED ORDER — ADULT MULTIVITAMIN W/MINERALS CH
1.0000 | ORAL_TABLET | Freq: Every morning | ORAL | Status: DC
  Administered 2023-07-13 – 2023-07-19 (×7): 1 via ORAL
  Filled 2023-07-12 (×7): qty 1

## 2023-07-12 MED ORDER — SERTRALINE HCL 50 MG PO TABS
25.0000 mg | ORAL_TABLET | Freq: Every day | ORAL | Status: DC
  Administered 2023-07-12 – 2023-07-19 (×8): 25 mg via ORAL
  Filled 2023-07-12 (×8): qty 1

## 2023-07-12 MED ORDER — LEVOTHYROXINE SODIUM 50 MCG PO TABS
50.0000 ug | ORAL_TABLET | Freq: Every day | ORAL | Status: DC
  Administered 2023-07-13 – 2023-07-19 (×7): 50 ug via ORAL
  Filled 2023-07-12 (×7): qty 1

## 2023-07-12 MED ORDER — ACETAMINOPHEN 650 MG RE SUPP
650.0000 mg | Freq: Three times a day (TID) | RECTAL | Status: DC
  Filled 2023-07-12 (×3): qty 1

## 2023-07-12 MED ORDER — OXYCODONE HCL 5 MG PO TABS
5.0000 mg | ORAL_TABLET | Freq: Four times a day (QID) | ORAL | Status: DC | PRN
  Administered 2023-07-12: 5 mg via ORAL
  Filled 2023-07-12: qty 1

## 2023-07-12 NOTE — ED Provider Notes (Signed)
 [place] EMERGENCY DEPARTMENT AT [facility] Provider Note  CSN: [id number] Arrival date & time: 07/12/23 0745  Chief Complaint(s) No chief complaint on file.  HPI Pamela Roman is a 68 y.o. female with PMH migraine disorder, hypothyroidism, multiple sclerosis on yearly infusions who presents Emergency Department for evaluation of fever, cough, confusion and lower extremity weakness with concern for MS flare.  History obtained from patient's sister who states that over the last 3 days she has become progressively more confused which is atypical for her even in the setting of an MS flare.  Tmax 102.1 at home and patient states that she has had a fever and cough as well.  Patient lives with her sister.  With her previous flares she primarily presents with lower extremity weakness and patient is unable to stand and has profound weakness in her lower extremities today.  Patient is displaying some encephalopathy here and additional history unable to be obtained in the setting of altered mental status.  Patient arrives hemodynamically stable with no documented fever and patient sister states that she has not had any antipyretics prior to arrival.   Past Medical History Past Medical History:  Diagnosis Date   Depression    Phreesia 08/06/2020   Hx of migraines    Hyperglycemia    Hypothyroidism    Migraines    Multiple sclerosis (HCC)    Patient Active Problem List   Diagnosis Date Noted   Hypertrophic toenail 10/05/2022   Seasonal allergic rhinitis 10/05/2022   Multiple sclerosis exacerbation (HCC) 06/15/2022   Bilateral leg weakness 06/14/2022   Physical deconditioning 02/25/2022   Hospital discharge follow-up 02/25/2022   MDD (major depressive disorder), recurrent episode, moderate (HCC) 02/25/2022   Encounter for examination following treatment at hospital 07/16/2021   Pharyngoesophageal dysphagia 03/12/2021   Migraine 08/09/2020   Hypothyroidism 08/09/2020   HLD  (hyperlipidemia) 08/09/2020   Osteoporosis 08/09/2020   Multiple falls 08/09/2020   Weight loss 08/09/2020   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 Authorizing Provider  alendronate  (FOSAMAX ) 70 MG tablet TAKE 1 TABLET ONCE A WEEK WITH A FULL GLASS OF WATER ON AN EMPTY STOMACH 12/28/22   Tobie Suzzane POUR, MD  BETA CAROTENE PO Take 1 capsule by mouth in the morning. Vitamin A, unknown strength.    [provider]  Cholecalciferol  (VITAMIN D3) 125 MCG (5000 UT) CAPS Take 1 capsule (5,000 Units total) by mouth daily. 03/10/23   Tobie Suzzane POUR, MD  Cladribine , 10 Tabs, (MAVENCLAD , 10 TABS,) 10 MG TBPK  09/15/22   [provider]  diazepam  (VALIUM ) 5 MG tablet TAKE 1 TABLET BY MOUTH 30 TO 40 MINUTES BEFORE MRI 02/26/23   Skeet Cornet R, DO  levothyroxine  (SYNTHROID ) 50 MCG tablet TAKE 1 TABLET DAILY BEFORE BREAKFAST 02/08/23   Tobie Suzzane POUR, MD  Multiple Vitamins-Minerals (ONE-A-DAY WOMENS 50 PLUS) TABS Take 1 tablet by mouth in the morning.    [provider]  sertraline  (ZOLOFT ) 25 MG tablet Take 1 tablet (25 mg total) by mouth daily. 04/30/23   Carvin Arvella HERO, MD  simvastatin  (ZOCOR ) 20 MG tablet TAKE 1 TABLET DAILY 02/08/23   Tobie Suzzane POUR, MD  SUMAtriptan  (IMITREX ) 100 MG tablet TAKE 1 TABLET EVERY 2 HOURS  AS NEEDED FOR MIGRAINE. MAY REPEAT IN 2 HOURS IF HEADACHE PERSISTS OR RECURS Patient taking differently: Take 100 mg by mouth See admin instructions. 100 mg at the onset of migraine. May repeat 100 mg in 2 hours if headache persists or recurs. 05/21/21   Skeet Juliene SAUNDERS, DO  zonisamide  (ZONEGRAN ) 100 MG capsule TAKE 1 CAPSULE DAILY 12/02/22   Skeet Juliene SAUNDERS, DO                                                                                                                                     Past Surgical History Past Surgical History:  Procedure Laterality Date   CATARACT EXTRACTION W/PHACO Left 01/01/2020   Procedure: CATARACT EXTRACTION PHACO AND INTRAOCULAR LENS PLACEMENT LEFT EYE;  Surgeon: Harrie Agent, MD;  Location: [facility];  Service: Ophthalmology;  Laterality: Left;  CDE: 3.37   CATARACT EXTRACTION W/PHACO Right 01/19/2020   Procedure: CATARACT EXTRACTION PHACO AND INTRAOCULAR LENS PLACEMENT RIGHT EYE;  Surgeon: Harrie Agent, MD;  Location: [facility];  Service: Ophthalmology;  Laterality: Right;  CDE: 3.16   TONSILLECTOMY     Family History Family History  Problem Relation Age of Onset   Dementia Mother    Dementia Father     Social History Social History   Tobacco Use   Smoking status: Never    Passive exposure: Never   Smokeless tobacco: Never   Tobacco comments:    Lived with smoker  Vaping Use   Vaping status: Never Used  Substance Use Topics   Alcohol use: Not Currently   Drug use: Never   Allergies Codeine  Review of Systems Review of Systems  Constitutional:  Positive for fatigue and fever.  Respiratory:  Positive for cough.   Neurological:  Positive for weakness.  Psychiatric/Behavioral:  Positive for confusion.     Physical Exam Vital Signs  I have reviewed the triage vital signs BP 115/72 (BP Location: Right Arm)   Pulse 94   Temp 98.6 F (37 C) (Oral)   Resp 18   Ht 5' 3 (1.6 m)   Wt 59.9 kg   SpO2 100%   BMI 23.38 kg/m   Physical Exam Vitals and nursing note reviewed.  Constitutional:      General: She is not in acute distress.    Appearance: She is well-developed.  HENT:     Head: Normocephalic and atraumatic.  Eyes:     Conjunctiva/sclera: Conjunctivae normal.  Cardiovascular:     Rate and Rhythm: Normal rate and regular rhythm.     Heart sounds: No murmur heard. Pulmonary:     Effort: Pulmonary effort is normal. No  respiratory distress.     Breath sounds: Normal breath sounds.  Abdominal:     Palpations: Abdomen is soft.     Tenderness: There is no abdominal tenderness.  Musculoskeletal:        General: No swelling.  Cervical back: Neck supple.  Skin:    General: Skin is warm and dry.     Capillary Refill: Capillary refill takes less than 2 seconds.  Neurological:     Mental Status: She is alert.     Motor: Weakness present.  Psychiatric:        Mood and Affect: Mood normal.     ED Results and Treatments Labs (all labs ordered are listed, but only abnormal results are displayed) Labs Reviewed  RESP PANEL BY RT-PCR (RSV, FLU A&B, COVID)  RVPGX2  COMPREHENSIVE METABOLIC PANEL  CBC WITH DIFFERENTIAL/PLATELET  TSH  URINALYSIS, ROUTINE W REFLEX MICROSCOPIC                                                                                                                          Radiology No results found.  Pertinent labs & imaging results that were available during my care of the patient were reviewed by me and considered in my medical decision making (see MDM for details).  Medications Ordered in ED Medications - No data to display                                                                                                                                   Procedures Procedures  (including critical care time)  Medical Decision Making / ED Course   This patient presents to the ED for concern of generalized weakness, cough, fever, this involves an extensive number of treatment options, and is a complaint that carries with it a high risk of complications and morbidity.  The differential diagnosis includes MS flare, unspecified viral URI, COVID, influenza, RSV, pneumonia, dehydration, electrolyte abnormality  MDM: Patient seen emergency room for evaluation of cough, fever and generalized weakness worsening lower extremities.  Physical exam with decent strength in the upper  extremities but poor effort against gravity in the lower extremities.  Laboratory evaluation largely unremarkabe.  Extensive MRI imaging performed of the brain, C-spine, T and L-spine in the setting of her known multiple sclerosis.  There is no evidence of an acute flare but does show extensive evidence of previously known multiple sclerosis.  Patient is influenza positive and is likely the source of her cough, fever and generalized weakness.  Patient unable to perform her ADLs in the setting of this influenza and thus will require hospital admission.  Patient admitted.   Additional history obtained: -Additional  history obtained from sister Patty -External records from outside source obtained and reviewed including: Chart review including previous notes, labs, imaging, consultation notes   Lab Tests: -I ordered, reviewed, and interpreted labs.   The pertinent results include:   Labs Reviewed  RESP PANEL BY RT-PCR (RSV, FLU A&B, COVID)  RVPGX2  COMPREHENSIVE METABOLIC PANEL  CBC WITH DIFFERENTIAL/PLATELET  TSH  URINALYSIS, ROUTINE W REFLEX MICROSCOPIC      Imaging Studies ordered: I ordered imaging studies including MRI brain, T-spine, C-spine, L-spine, chest x-ray I independently visualized and interpreted imaging. I agree with the radiologist interpretation   Medicines ordered and prescription drug management: No orders of the defined types were placed in this encounter.   -I have reviewed the patients home medicines and have made adjustments as needed  Critical interventions none  Consultations Obtained: I requested consultation with the neurologist on-call Dr. Lindzen,  and discussed lab and imaging findings as well as pertinent plan - they recommend: No additional steroids at this time   Cardiac Monitoring: The patient was maintained on a cardiac monitor.  I personally viewed and interpreted the cardiac monitored which showed an underlying rhythm of: NSR  Social  Determinants of Health:  Factors impacting patients care include: none   Reevaluation: After the interventions noted above, I reevaluated the patient and found that they have :improved  Co morbidities that complicate the patient evaluation  Past Medical History:  Diagnosis Date   Depression    Phreesia 08/06/2020   Hx of migraines    Hyperglycemia    Hypothyroidism    Migraines    Multiple sclerosis (HCC)       Dispostion: I considered admission for this patient, and she require hospital admission for inability to perform her ADLs in the setting of a influenza infection     Final Clinical Impression(s) / ED Diagnoses Final diagnoses:  None     @PCDICTATION @    Albertina Dixon, MD 07/12/23 1550

## 2023-07-12 NOTE — H&P (Signed)
 History and Physical  [facility]  Pamela Roman FMW:[id number] DOB: [birth year] DOA: 07/12/2023  PCP: Tobie Suzzane POUR, MD  Patient coming from: Home by [facility]   Level of care: Med-Surg  I have personally briefly reviewed patient's old medical records in [facility]  Chief Complaint: Weakness and fever   HPI: Pamela Roman is a 68 year old female with longstanding MS followed by Dr. Skeet with [facility] who is currently living with her sister with a history of bouts of MS flare presented to ED today with concern that she was having an MS flare.  She was brought to ED by [facility] because she has been having intermittent confusion and weakness in both legs for a few days.  She has had some fever at home and chest congestion with a temperature of 102 at home.  She says that she was told not to get vaccinated for influenza due to her MS.  She tested positive for influenza A.  She was sent for MRI of brain and spine but no evidence of MS flare on imaging. She is being admitted for supportive care due to her high risk of complication given immunocompromised state.      Past Medical History:  Diagnosis Date   Depression    Phreesia 08/06/2020   Hx of migraines    Hyperglycemia    Hypothyroidism    Migraines    Multiple sclerosis (HCC)     Past Surgical History:  Procedure Laterality Date   CATARACT EXTRACTION W/PHACO Left 01/01/2020   Procedure: CATARACT EXTRACTION PHACO AND INTRAOCULAR LENS PLACEMENT LEFT EYE;  Surgeon: Harrie Agent, MD;  Location: [facility];  Service: Ophthalmology;  Laterality: Left;  CDE: 3.37   CATARACT EXTRACTION W/PHACO Right 01/19/2020   Procedure: CATARACT EXTRACTION PHACO AND INTRAOCULAR LENS PLACEMENT RIGHT EYE;  Surgeon: Harrie Agent, MD;  Location: [facility];  Service: Ophthalmology;  Laterality: Right;  CDE: 3.16   TONSILLECTOMY       reports that she has never smoked. She has never been exposed to tobacco smoke. She has never used smokeless  tobacco. She reports that she does not currently use alcohol. She reports that she does not use drugs.  Allergies  Allergen Reactions   Codeine Nausea And Vomiting    Family History  Problem Relation Age of Onset   Dementia Mother    Dementia Father     Prior to Admission medications   Medication Sig Start Date End Date Taking? Authorizing Provider  alendronate  (FOSAMAX ) 70 MG tablet TAKE 1 TABLET ONCE A WEEK WITH A FULL GLASS OF WATER ON AN EMPTY STOMACH Patient taking differently: Take 70 mg by mouth once a week. 12/28/22  Yes Tobie Suzzane POUR, MD  BETA CAROTENE PO Take 1 capsule by mouth in the morning. Vitamin A, unknown strength.   Yes [provider]  Cholecalciferol  (VITAMIN D3) 125 MCG (5000 UT) CAPS Take 1 capsule (5,000 Units total) by mouth daily. 03/10/23  Yes Patel, Suzzane POUR, MD  Cladribine , 10 Tabs, (MAVENCLAD , 10 TABS,) 10 MG TBPK  09/15/22  Yes [provider]  diazepam  (VALIUM ) 5 MG tablet TAKE 1 TABLET BY MOUTH 30 TO 40 MINUTES BEFORE MRI 02/26/23  Yes Jaffe, Adam R, DO  levothyroxine  (SYNTHROID ) 50 MCG tablet TAKE 1 TABLET DAILY BEFORE BREAKFAST 02/08/23  Yes Tobie Suzzane POUR, MD  Multiple Vitamins-Minerals (ONE-A-DAY WOMENS 50 PLUS) TABS Take 1 tablet by mouth in the morning.   Yes [provider]  sertraline  (  ZOLOFT ) 25 MG tablet Take 1 tablet (25 mg total) by mouth daily. 04/30/23  Yes Carvin Arvella HERO, MD  simvastatin  (ZOCOR ) 20 MG tablet TAKE 1 TABLET DAILY 02/08/23  Yes Tobie Suzzane POUR, MD  SUMAtriptan  (IMITREX ) 100 MG tablet TAKE 1 TABLET EVERY 2 HOURS AS NEEDED FOR MIGRAINE. MAY REPEAT IN 2 HOURS IF HEADACHE PERSISTS OR RECURS Patient taking differently: Take 100 mg by mouth See admin instructions. 100 mg at the onset of migraine. May repeat 100 mg in 2 hours if headache persists or recurs. 05/21/21  Yes Skeet Juliene SAUNDERS, DO   Physical Exam: Vitals:   07/12/23 0800 07/12/23 0809 07/12/23 0845 07/12/23 1246  BP: 115/72  120/68 119/72  Pulse: 94   96 87  Resp: 18  18 14   Temp: 98.6 F (37 C)   98.4 F (36.9 C)  TempSrc: Oral   Oral  SpO2: 100%  96% 96%  Weight: 59.4 kg 59.9 kg    Height: 4' 11 (1.499 m) 5' 3 (1.6 m)     Constitutional: frail female, awake, alert, NAD, calm, comfortable Eyes: PERRL, lids and conjunctivae normal ENMT: Mucous membranes are moist. Posterior pharynx clear of any exudate or lesions.Normal dentition.  Neck: normal, supple, no masses, no thyromegaly Respiratory: clear to auscultation bilaterally, no wheezing, no crackles. Normal respiratory effort. No accessory muscle use.  Cardiovascular: normal s1, s2 sounds, no murmurs / rubs / gallops. No extremity edema. 2+ pedal pulses. No carotid bruits.  Abdomen: no tenderness, no masses palpated. No hepatosplenomegaly. Bowel sounds positive.  Musculoskeletal: no clubbing / cyanosis. No joint deformity upper and lower extremities. Good ROM, no contractures. Normal muscle tone.  Skin: no rashes, lesions, ulcers. No induration Neurologic: CN 2-12 grossly intact. Sensation intact, DTR diminished BLEs. Strength 3/5 in all 4.  Psychiatric: Normal judgment and insight. Alert and oriented x 3. Normal mood.   Labs on Admission: I have personally reviewed following labs and imaging studies  CBC: Recent Labs  Lab 07/12/23 0858  WBC 7.2  NEUTROABS 6.1  HGB 14.2  HCT 43.7  MCV 92.6  PLT 173   Basic Metabolic Panel: Recent Labs  Lab 07/12/23 0858  NA 135  K 3.5  CL 106  CO2 21*  GLUCOSE 113*  BUN 22  CREATININE 1.03*  CALCIUM 8.6*   GFR: Estimated Creatinine Clearance: 43.8 mL/min (A) (by C-G formula based on SCr of 1.03 mg/dL (H)). Liver Function Tests: Recent Labs  Lab 07/12/23 0858  AST 22  ALT 15  ALKPHOS 79  BILITOT 0.8  PROT 7.0  ALBUMIN 4.0   No results for input(s): LIPASE, AMYLASE in the last 168 hours. No results for input(s): AMMONIA in the last 168 hours. Coagulation Profile: No results for input(s): INR, PROTIME in  the last 168 hours. Cardiac Enzymes: No results for input(s): CKTOTAL, CKMB, CKMBINDEX, TROPONINI in the last 168 hours. BNP (last 3 results) No results for input(s): PROBNP in the last 8760 hours. HbA1C: No results for input(s): HGBA1C in the last 72 hours. CBG: No results for input(s): GLUCAP in the last 168 hours. Lipid Profile: No results for input(s): CHOL, HDL, LDLCALC, TRIG, CHOLHDL, LDLDIRECT in the last 72 hours. Thyroid Function Tests: Recent Labs    07/12/23 0858  TSH 0.621   Anemia Panel: No results for input(s): VITAMINB12, FOLATE, FERRITIN, TIBC, IRON, RETICCTPCT in the last 72 hours. Urine analysis:    Component Value Date/Time   COLORURINE YELLOW 06/14/2022 1057   APPEARANCEUR CLOUDY (A) 06/14/2022 1057  LABSPEC 1.017 06/14/2022 1057   PHURINE 8.0 06/14/2022 1057   GLUCOSEU NEGATIVE 06/14/2022 1057   HGBUR NEGATIVE 06/14/2022 1057   BILIRUBINUR NEGATIVE 06/14/2022 1057   KETONESUR NEGATIVE 06/14/2022 1057   PROTEINUR NEGATIVE 06/14/2022 1057   NITRITE NEGATIVE 06/14/2022 1057   LEUKOCYTESUR NEGATIVE 06/14/2022 1057    Radiological Exams on Admission: MR Lumbar Spine W Wo Contrast Result Date: 07/12/2023 CLINICAL DATA:  History of multiple sclerosis. Follow-up for change or progressive disease. EXAM: MRI LUMBAR SPINE WITHOUT AND WITH CONTRAST TECHNIQUE: Multiplanar and multiecho pulse sequences of the lumbar spine were obtained without and with intravenous contrast. CONTRAST:  6mL GADAVIST  GADOBUTROL  1 MMOL/ML IV SOLN COMPARISON:  CT 06/14/2022.  MRI 11/15/2020. FINDINGS: Segmentation: 5 lumbar type vertebral bodies as numbered previously. Alignment: Mild scoliotic curvature convex to the left. 2 mm retrolisthesis at L2-3. Vertebrae: Old healed augmented fracture at L1. No progression or ongoing edema. Old healed inferior endplate fracture at L2 without progression. No new regional fracture. Conus medullaris and cauda equina:  Distal cord and conus appear normal. Conus tip at T12-L1. Paraspinal and other soft tissues: Negative Disc levels: L1-2: Disc bulge.  No stenosis. L2-3: 2 mm retrolisthesis. Mild posterior bowing of the posteroinferior margin of the vertebral body related to the old fracture. Mild narrowing of the lateral recesses and foramina but no definite neural compression. L3-4: Disc bulge. Mild facet hypertrophy. Mild lateral recess narrowing but no neural compression. L4-5: Disc bulge. Mild facet hypertrophy. Mild narrowing of the lateral recesses but no visible neural compression. L5-S1: Minimal disc bulge.  No stenosis. IMPRESSION: 1. Old healed augmented fracture at L1. Old healed inferior endplate fracture at L2. No new fracture, progression or ongoing edema. 2. Mild degenerative changes elsewhere in the lumbar spine as outlined above. Mild narrowing of the lateral recesses and foramina at L2-3 and L3-4 but no visible neural compression. Electronically Signed   By: Oneil Officer M.D.   On: 07/12/2023 11:05   MR THORACIC SPINE W WO CONTRAST Result Date: 07/12/2023 CLINICAL DATA:  History of multiple sclerosis.  Follow-up. EXAM: MRI THORACIC WITHOUT AND WITH CONTRAST TECHNIQUE: Multiplanar and multiecho pulse sequences of the thoracic spine were obtained without and with intravenous contrast. CONTRAST:  6mL GADAVIST  GADOBUTROL  1 MMOL/ML IV SOLN COMPARISON:  03/10/2023 FINDINGS: Alignment: No change in alignment. Slightly increased thoracic kyphotic curvature. Vertebrae: Chronic benign hemangioma within the T2 vertebral body as seen previously. Old healed partial compression fractures at T3, T5, T6, T10 and T11. Old fracture at T12 with previous vertebral augmentation. No evidence of recent or progressive fracture. No marrow edema or enhancement. Cord: Axial images suffer from motion degradation. Small foci of T2 signal within the cord appears similar to the prior study. No advanced involvement in the thoracic region. No  evidence of progressive disease. No abnormal contrast enhancement. Paraspinal and other soft tissues: Negative Disc levels: No significant disc level finding in the thoracic region. No compressive narrowing of the canal or foramina. IMPRESSION: 1. No change since the prior study. Small foci of T2 signal within the cord consistent with the history of multiple sclerosis. No evidence of progressive disease. No abnormal contrast enhancement. 2. Old healed partial compression fractures at T3, T5, T6, T10 and T11. Old fracture at T12 with previous vertebral augmentation. No evidence of recent or progressive fracture. Electronically Signed   By: Oneil Officer M.D.   On: 07/12/2023 11:00   MR Cervical Spine W and Wo Contrast Result Date: 07/12/2023 CLINICAL DATA:  Multiple  sclerosis. Cervical spinal cord involvement. Assess for change or active disease. EXAM: MRI CERVICAL SPINE WITHOUT AND WITH CONTRAST TECHNIQUE: Multiplanar and multiecho pulse sequences of the cervical spine, to include the craniocervical junction and cervicothoracic junction, were obtained without and with intravenous contrast. CONTRAST:  6mL GADAVIST  GADOBUTROL  1 MMOL/ML IV SOLN COMPARISON:  03/10/2023 FINDINGS: Alignment: No malalignment. Vertebrae: No acute fracture. 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 Old minor superior endplate depression at T3. Cord: No cord compression. Numerous foci of abnormal T2 signal within the cervical cord without evidence of change or progression since September. Cord volume loss at the C7-T1 level is similar. Axial images do suffer from motion degradation. No abnormal regional enhancement. Posterior Fossa, vertebral arteries, paraspinal tissues: See results of brain MRI. Soft tissues of the neck appear unremarkable. Disc levels: No significant disc level pathology. Mild non-compressive spondylosis at C5-6 and C6-7 as seen previously. IMPRESSION: 1. Numerous foci of abnormal T2 signal  within the cervical cord without evidence of change or progression since September. Cord volume loss at the C7-T1 level is similar. No abnormal regional enhancement. 2. Mild non-compressive spondylosis at C5-6 and C6-7. Electronically Signed   By: Oneil Officer M.D.   On: 07/12/2023 10:56   MR Brain W and Wo Contrast Result Date: 07/12/2023 CLINICAL DATA:  Multiple sclerosis. Assess for clinical change or progression. EXAM: MRI HEAD WITHOUT AND WITH CONTRAST TECHNIQUE: Multiplanar, multiecho pulse sequences of the brain and surrounding structures were obtained without and with intravenous contrast. CONTRAST:  6mL GADAVIST  GADOBUTROL  1 MMOL/ML IV SOLN COMPARISON:  03/10/2023 FINDINGS: Brain: No visible change since the study of September. Widespread foci of demyelination seen within the cerebellum, brainstem and cerebral hemispheric white matter. No new or progressive lesions. No lesions show restricted diffusion or contrast enhancement. No intracranial hemorrhage. No hydrocephalus or extra-axial collection. In Vascular: Major vessels at the base of the brain show flow. Skull and upper cervical spine: Negative Sinuses/Orbits: Mild mucosal thickening of the left maxillary sinus. No advanced sinus inflammation. Orbits negative. Other: None IMPRESSION: No visible change since the study of September. Widespread foci of demyelination within the cerebellum, brainstem and cerebral hemispheric white matter. No new or progressive lesions. No lesions show restricted diffusion or contrast enhancement. Electronically Signed   By: Oneil Officer M.D.   On: 07/12/2023 10:53   DG Chest 2 View Result Date: 07/12/2023 CLINICAL DATA:  Rule out pneumonia.  Multiple sclerosis flare. EXAM: CHEST - 2 VIEW COMPARISON:  07/01/2021 FINDINGS: Normal heart size. Aortic tortuosity. There is no edema, consolidation, effusion, or pneumothorax. Vertebral cement augmentation at the thoracolumbar junction. IMPRESSION: No active cardiopulmonary  disease. Electronically Signed   By: Dorn Roulette M.D.   On: 07/12/2023 08:52   Assessment/Plan Principal Problem:   Influenza A Active Problems:   Bilateral leg weakness   Hypothyroidism   Multiple sclerosis (HCC)   HLD (hyperlipidemia)   Osteoporosis   Multiple falls   Physical deconditioning   Generalized weakness   Altered mental status   Fever, unspecified   Influenza A infection - this is likely cause of fever and malaise and generalized weakness - given that patient is immunocompromised with MS she will be placed on Tamiflu  - Start Tamiflu  75 mg twice daily for 5 days - continue supportive measures  - acetaminophen  ordered for fever symptoms  - gentle IV fluid hydration ordered   Generalized weakness - secondary to influenza infection  - MRI testing stable no evidence of MS flare -  continue supportive measures as ordered   Fever  - treat with acetaminophen   - no evidence of pneumonia on xray   Multiple Falls  - pt remains high fall risk due to MS - fall precautions ordered  Altered mental status  - Pt seems to be back to baseline now - confusion likely was associated with fever  - following and neuro checks ordered   Hypothyroidism  - resume home levothyroxine  supplementation   DVT prophylaxis: enoxaparin    Code Status: Full   Family Communication:   Disposition Plan: home   Consults called:   Admission status: INP   Level of care: Med-Surg Afton Louder MD [facility] How to contact the [facility] Attending or Consulting provider 7A - 7P or covering provider during after hours 7P -7A, for this patient?  Check the care team in [facility] and look for a) attending/consulting [facility] provider listed and b) the [facility] team listed Log into [email or web address] and use [facility] universal password to access. If you do not have the password, please contact the hospital operator. Locate the [facility] provider you are looking for under [facility] and page to a number  that you can be directly reached. If you still have difficulty reaching the provider, please page the [facility] (Director on Call) for the Hospitalists listed on amion for assistance.   If 7PM-7AM, please contact night-coverage [email or web address] Password [email or web address]  07/12/2023, 1:06 PM

## 2023-07-12 NOTE — Hospital Course (Signed)
 68 year old female with longstanding MS followed by Dr. Skeet with [facility] who is currently living with her sister with a history of bouts of MS flare presented to ED today with concern that she was having an MS flare.  She was brought to ED by [facility] because she has been having intermittent confusion and weakness in both legs for a few days.  She has had some fever at home and chest congestion with a temperature of 102 at home.  She says that she was told not to get vaccinated for influenza due to her MS.  She tested positive for influenza A.  She was sent for MRI of brain and spine but no evidence of MS flare on imaging. She is being admitted for supportive care due to her high risk of complication given immunocompromised state.

## 2023-07-12 NOTE — ED Triage Notes (Addendum)
 Pt brought in by [facility] from home with c/o MS flare up with weakness to bilateral legs x few days, some confusion, fever, and chest congestion. Temp of 102 at home. EMS vitals -temp 99.0, HR 102, BP 106/54, O2 sat 92% RA.

## 2023-07-13 DIAGNOSIS — G35 Multiple sclerosis: Secondary | ICD-10-CM

## 2023-07-13 DIAGNOSIS — R29898 Other symptoms and signs involving the musculoskeletal system: Secondary | ICD-10-CM | POA: Diagnosis not present

## 2023-07-13 DIAGNOSIS — J101 Influenza due to other identified influenza virus with other respiratory manifestations: Secondary | ICD-10-CM | POA: Diagnosis not present

## 2023-07-13 DIAGNOSIS — R296 Repeated falls: Secondary | ICD-10-CM | POA: Diagnosis not present

## 2023-07-13 LAB — BASIC METABOLIC PANEL
Anion gap: 6 (ref 5–15)
BUN: 26 mg/dL — ABNORMAL HIGH (ref 8–23)
CO2: 23 mmol/L (ref 22–32)
Calcium: 8 mg/dL — ABNORMAL LOW (ref 8.9–10.3)
Chloride: 105 mmol/L (ref 98–111)
Creatinine, Ser: 0.76 mg/dL (ref 0.44–1.00)
GFR, Estimated: >60 mL/min (ref >60)
Glucose, Bld: 77 mg/dL (ref 70–99)
Potassium: 3.3 mmol/L — ABNORMAL LOW (ref 3.5–5.1)
Sodium: 134 mmol/L — ABNORMAL LOW (ref 135–145)

## 2023-07-13 LAB — MAGNESIUM: Magnesium: 2.1 mg/dL (ref 1.7–2.4)

## 2023-07-13 MED ORDER — POTASSIUM CHLORIDE CRYS ER 20 MEQ PO TBCR
20.0000 meq | EXTENDED_RELEASE_TABLET | Freq: Two times a day (BID) | ORAL | Status: AC
  Administered 2023-07-13 (×2): 20 meq via ORAL
  Filled 2023-07-13 (×2): qty 1

## 2023-07-13 NOTE — Progress Notes (Signed)
 PROGRESS NOTE   Pamela Roman  FMW:[id number] DOB: Nov 28, 1955 DOA: 07/12/2023 PCP: Tobie Suzzane POUR, MD   No chief complaint on file.  Level of care: Med-Surg  Brief Admission History:  68 year old female with longstanding MS followed by Dr. Skeet with [facility] who is currently living with her sister with a history of bouts of MS flare presented to ED today with concern that she was having an MS flare.  She was brought to ED by [facility] because she has been having intermittent confusion and weakness in both legs for a few days.  She has had some fever at home and chest congestion with a temperature of 102 at home.  She says that she was told not to get vaccinated for influenza due to her MS.  She tested positive for influenza A.  She was sent for MRI of brain and spine but no evidence of MS flare on imaging. She is being admitted for supportive care due to her high risk of complication given immunocompromised state.     Assessment and Plan:  Influenza A infection - this is likely cause of fever and malaise and generalized weakness - given that patient is immunocompromised with MS she will be placed on Tamiflu  - Tamiflu  30 mg twice daily for 5 days - continue supportive measures  - acetaminophen  ordered for fever symptoms  - gentle IV fluid hydration completed   Generalized weakness - secondary to influenza infection  - MRI testing stable no evidence of MS flare - continue supportive measures as ordered    Fever  - treat with acetaminophen   - no evidence of pneumonia on xray    Multiple Falls  - pt remains high fall risk due to MS - fall precautions ordered   Altered mental status  - Pt seems to be back to baseline now - confusion likely was associated with fever  - following and neuro checks ordered    Hypothyroidism  - resume home levothyroxine  supplementation     DVT prophylaxis: enoxaparin  Code Status: Full  Family Communication:  Disposition: anticipate home  with home health   Consultants:  PT/OT  Procedures:   Antivirals/Antimicrobials:  Tamiflu  1/6>>   Subjective: Pt says she is starting to feel better, she is still very weak in her legs. No fever or chills since starting tamiflu  treatment.   Objective: Vitals:   07/12/23 1734 07/12/23 2118 07/13/23 0125 07/13/23 0515  BP: 112/70 (!) 93/58 111/65 (!) 95/53  Pulse: 71 67 62 78  Resp: 16 18 18 18   Temp: 98.2 F (36.8 C) 98.9 F (37.2 C) 98.6 F (37 C) 99.1 F (37.3 C)  TempSrc: Oral  Oral   SpO2: 92% 95% 96% 100%  Weight: 56.6 kg     Height: 5' 3 (1.6 m)       Intake/Output Summary (Last 24 hours) at 07/13/2023 1230 Last data filed at 07/13/2023 0900 Gross per 24 hour  Intake 1163.9 ml  Output 200 ml  Net 963.9 ml   Filed Weights   07/12/23 0800 07/12/23 0809 07/12/23 1734  Weight: 59.4 kg 59.9 kg 56.6 kg   Examination:  Constitutional: frail female, awake, alert, NAD, calm, comfortable, afebrile. Talking on phone.  Eyes: PERRL, lids and conjunctivae normal ENMT: Mucous membranes are moist. Posterior pharynx clear of any exudate or lesions. Normal dentition.  Neck: normal, supple, no masses, no thyromegaly Respiratory: clear to auscultation bilaterally, no wheezing, no crackles. Normal respiratory effort. No accessory muscle use.  Cardiovascular: normal  s1, s2 sounds, no murmurs / rubs / gallops. No extremity edema. 2+ pedal pulses. No carotid bruits.  Abdomen: no tenderness, no masses palpated. No hepatosplenomegaly. Bowel sounds positive.  Musculoskeletal: no clubbing / cyanosis. No joint deformity upper and lower extremities. Good ROM, no contractures. reduced muscle tone in legs.  Skin: no rashes, lesions, ulcers. No induration Neurologic: CN 2-12 grossly intact. Sensation intact, DTR diminished BLEs. Strength 3/5 in all 4.  Psychiatric: Normal judgment and insight. Alert and oriented x 3. Normal mood.   Data Reviewed: I have personally reviewed following labs and  imaging studies  CBC: Recent Labs  Lab 07/12/23 0858  WBC 7.2  NEUTROABS 6.1  HGB 14.2  HCT 43.7  MCV 92.6  PLT 173    Basic Metabolic Panel: Recent Labs  Lab 07/12/23 0858 07/13/23 0404  NA 135 134*  K 3.5 3.3*  CL 106 105  CO2 21* 23  GLUCOSE 113* 77  BUN 22 26*  CREATININE 1.03* 0.76  CALCIUM 8.6* 8.0*  MG  --  2.1    CBG: No results for input(s): GLUCAP in the last 168 hours.  Recent Results (from the past 240 hours)  Resp panel by RT-PCR (RSV, Flu A&B, Covid) Anterior Nasal Swab     Status: Abnormal   Collection Time: 07/12/23  8:14 AM   Specimen: Anterior Nasal Swab  Result Value Ref Range Status   SARS Coronavirus 2 by RT PCR NEGATIVE NEGATIVE Final    Comment: (NOTE) SARS-CoV-2 target nucleic acids are NOT DETECTED.  The SARS-CoV-2 RNA is generally detectable in upper respiratory specimens during the acute phase of infection. The lowest concentration of SARS-CoV-2 viral copies this assay can detect is 138 copies/mL. A negative result does not preclude SARS-Cov-2 infection and should not be used as the sole basis for treatment or other patient management decisions. A negative result may occur with  improper specimen collection/handling, submission of specimen other than nasopharyngeal swab, presence of viral mutation(s) within the areas targeted by this assay, and inadequate number of viral copies(<138 copies/mL). A negative result must be combined with clinical observations, patient history, and epidemiological information. The expected result is Negative.  Fact Sheet for Patients:  bloggercourse.com  Fact Sheet for Healthcare Providers:  seriousbroker.it  This test is no t yet approved or cleared by the [place]  FDA and  has been authorized for detection and/or diagnosis of SARS-CoV-2 by FDA under an Emergency Use Authorization (EUA). This EUA will remain  in effect (meaning this test can  be used) for the duration of the COVID-19 declaration under Section 564(b)(1) of the Act, 21 U.S.C.section 360bbb-3(b)(1), unless the authorization is terminated  or revoked sooner.       Influenza A by PCR POSITIVE (A) NEGATIVE Final   Influenza B by PCR NEGATIVE NEGATIVE Final    Comment: (NOTE) The Xpert Xpress SARS-CoV-2/FLU/RSV plus assay is intended as an aid in the diagnosis of influenza from Nasopharyngeal swab specimens and should not be used as a sole basis for treatment. Nasal washings and aspirates are unacceptable for Xpert Xpress SARS-CoV-2/FLU/RSV testing.  Fact Sheet for Patients: bloggercourse.com  Fact Sheet for Healthcare Providers: seriousbroker.it  This test is not yet approved or cleared by the [place]  FDA and has been authorized for detection and/or diagnosis of SARS-CoV-2 by FDA under an Emergency Use Authorization (EUA). This EUA will remain in effect (meaning this test can be used) for the duration of the COVID-19 declaration under Section 564(b)(1) of the Act,  21 U.S.C. section 360bbb-3(b)(1), unless the authorization is terminated or revoked.     Resp Syncytial Virus by PCR NEGATIVE NEGATIVE Final    Comment: (NOTE) Fact Sheet for Patients: bloggercourse.com  Fact Sheet for Healthcare Providers: seriousbroker.it  This test is not yet approved or cleared by the [place]  FDA and has been authorized for detection and/or diagnosis of SARS-CoV-2 by FDA under an Emergency Use Authorization (EUA). This EUA will remain in effect (meaning this test can be used) for the duration of the COVID-19 declaration under Section 564(b)(1) of the Act, 21 U.S.C. section 360bbb-3(b)(1), unless the authorization is terminated or revoked.  Performed at [facility], [address] [place], [place] [address]      Radiology Studies: MR Lumbar Spine W Wo  Contrast Result Date: 07/12/2023 CLINICAL DATA:  History of multiple sclerosis. Follow-up for change or progressive disease. EXAM: MRI LUMBAR SPINE WITHOUT AND WITH CONTRAST TECHNIQUE: Multiplanar and multiecho pulse sequences of the lumbar spine were obtained without and with intravenous contrast. CONTRAST:  6mL GADAVIST  GADOBUTROL  1 MMOL/ML IV SOLN COMPARISON:  CT 06/14/2022.  MRI 11/15/2020. FINDINGS: Segmentation: 5 lumbar type vertebral bodies as numbered previously. Alignment: Mild scoliotic curvature convex to the left. 2 mm retrolisthesis at L2-3. Vertebrae: Old healed augmented fracture at L1. No progression or ongoing edema. Old healed inferior endplate fracture at L2 without progression. No new regional fracture. Conus medullaris and cauda equina: Distal cord and conus appear normal. Conus tip at T12-L1. Paraspinal and other soft tissues: Negative Disc levels: L1-2: Disc bulge.  No stenosis. L2-3: 2 mm retrolisthesis. Mild posterior bowing of the posteroinferior margin of the vertebral body related to the old fracture. Mild narrowing of the lateral recesses and foramina but no definite neural compression. L3-4: Disc bulge. Mild facet hypertrophy. Mild lateral recess narrowing but no neural compression. L4-5: Disc bulge. Mild facet hypertrophy. Mild narrowing of the lateral recesses but no visible neural compression. L5-S1: Minimal disc bulge.  No stenosis. IMPRESSION: 1. Old healed augmented fracture at L1. Old healed inferior endplate fracture at L2. No new fracture, progression or ongoing edema. 2. Mild degenerative changes elsewhere in the lumbar spine as outlined above. Mild narrowing of the lateral recesses and foramina at L2-3 and L3-4 but no visible neural compression. Electronically Signed   By: Oneil Officer M.D.   On: 07/12/2023 11:05   MR THORACIC SPINE W WO CONTRAST Result Date: 07/12/2023 CLINICAL DATA:  History of multiple sclerosis.  Follow-up. EXAM: MRI THORACIC WITHOUT AND WITH CONTRAST  TECHNIQUE: Multiplanar and multiecho pulse sequences of the thoracic spine were obtained without and with intravenous contrast. CONTRAST:  6mL GADAVIST  GADOBUTROL  1 MMOL/ML IV SOLN COMPARISON:  03/10/2023 FINDINGS: Alignment: No change in alignment. Slightly increased thoracic kyphotic curvature. Vertebrae: Chronic benign hemangioma within the T2 vertebral body as seen previously. Old healed partial compression fractures at T3, T5, T6, T10 and T11. Old fracture at T12 with previous vertebral augmentation. No evidence of recent or progressive fracture. No marrow edema or enhancement. Cord: Axial images suffer from motion degradation. Small foci of T2 signal within the cord appears similar to the prior study. No advanced involvement in the thoracic region. No evidence of progressive disease. No abnormal contrast enhancement. Paraspinal and other soft tissues: Negative Disc levels: No significant disc level finding in the thoracic region. No compressive narrowing of the canal or foramina. IMPRESSION: 1. No change since the prior study. Small foci of T2 signal within the cord consistent with the history of multiple  sclerosis. No evidence of progressive disease. No abnormal contrast enhancement. 2. Old healed partial compression fractures at T3, T5, T6, T10 and T11. Old fracture at T12 with previous vertebral augmentation. No evidence of recent or progressive fracture. Electronically Signed   By: Oneil Officer M.D.   On: 07/12/2023 11:00   MR Cervical Spine W and Wo Contrast Result Date: 07/12/2023 CLINICAL DATA:  Multiple sclerosis. Cervical spinal cord involvement. Assess for change or active disease. EXAM: MRI CERVICAL SPINE WITHOUT AND WITH CONTRAST TECHNIQUE: Multiplanar and multiecho pulse sequences of the cervical spine, to include the craniocervical junction and cervicothoracic junction, were obtained without and with intravenous contrast. CONTRAST:  6mL GADAVIST  GADOBUTROL  1 MMOL/ML IV SOLN COMPARISON:   03/10/2023 FINDINGS: Alignment: No malalignment. Vertebrae: No acute fracture. 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 Old minor superior endplate depression at T3. Cord: No cord compression. Numerous foci of abnormal T2 signal within the cervical cord without evidence of change or progression since September. Cord volume loss at the C7-T1 level is similar. Axial images do suffer from motion degradation. No abnormal regional enhancement. Posterior Fossa, vertebral arteries, paraspinal tissues: See results of brain MRI. Soft tissues of the neck appear unremarkable. Disc levels: No significant disc level pathology. Mild non-compressive spondylosis at C5-6 and C6-7 as seen previously. IMPRESSION: 1. Numerous foci of abnormal T2 signal within the cervical cord without evidence of change or progression since September. Cord volume loss at the C7-T1 level is similar. No abnormal regional enhancement. 2. Mild non-compressive spondylosis at C5-6 and C6-7. Electronically Signed   By: Oneil Officer M.D.   On: 07/12/2023 10:56   MR Brain W and Wo Contrast Result Date: 07/12/2023 CLINICAL DATA:  Multiple sclerosis. Assess for clinical change or progression. EXAM: MRI HEAD WITHOUT AND WITH CONTRAST TECHNIQUE: Multiplanar, multiecho pulse sequences of the brain and surrounding structures were obtained without and with intravenous contrast. CONTRAST:  6mL GADAVIST  GADOBUTROL  1 MMOL/ML IV SOLN COMPARISON:  03/10/2023 FINDINGS: Brain: No visible change since the study of September. Widespread foci of demyelination seen within the cerebellum, brainstem and cerebral hemispheric white matter. No new or progressive lesions. No lesions show restricted diffusion or contrast enhancement. No intracranial hemorrhage. No hydrocephalus or extra-axial collection. In Vascular: Major vessels at the base of the brain show flow. Skull and upper cervical spine: Negative Sinuses/Orbits: Mild mucosal thickening of the left  maxillary sinus. No advanced sinus inflammation. Orbits negative. Other: None IMPRESSION: No visible change since the study of September. Widespread foci of demyelination within the cerebellum, brainstem and cerebral hemispheric white matter. No new or progressive lesions. No lesions show restricted diffusion or contrast enhancement. Electronically Signed   By: Oneil Officer M.D.   On: 07/12/2023 10:53   DG Chest 2 View Result Date: 07/12/2023 CLINICAL DATA:  Rule out pneumonia.  Multiple sclerosis flare. EXAM: CHEST - 2 VIEW COMPARISON:  07/01/2021 FINDINGS: Normal heart size. Aortic tortuosity. There is no edema, consolidation, effusion, or pneumothorax. Vertebral cement augmentation at the thoracolumbar junction. IMPRESSION: No active cardiopulmonary disease. Electronically Signed   By: Dorn Roulette M.D.   On: 07/12/2023 08:52    Scheduled Meds:  acetaminophen   650 mg Oral TID   Or   acetaminophen   650 mg Rectal TID   cholecalciferol   5,000 Units Oral Daily   enoxaparin  (LOVENOX ) injection  40 mg Subcutaneous Q24H   famotidine   20 mg Oral QHS   levothyroxine   50 mcg Oral QAC breakfast   multivitamin with  minerals  1 tablet Oral q AM   oseltamivir   30 mg Oral BID   potassium chloride   20 mEq Oral BID   sertraline   25 mg Oral Daily   simvastatin   20 mg Oral q1800   Continuous Infusions:   LOS: 1 day   Time spent: 52 mins  Lucille Crichlow Vicci, MD How to contact the [facility] Attending or Consulting provider 7A - 7P or covering provider during after hours 7P -7A, for this patient?  Check the care team in [facility] and look for a) attending/consulting [facility] provider listed and b) the [facility] team listed Log into [email or web address] to find provider on call.  Locate the [facility] provider you are looking for under [facility] and page to a number that you can be directly reached. If you still have difficulty reaching the provider, please page the [facility] (Director on Call) for the Hospitalists listed on amion for  assistance.  07/13/2023, 12:30 PM

## 2023-07-13 NOTE — Plan of Care (Signed)
  Problem: Education: Goal: Knowledge of General Education information will improve Description: Including pain rating scale, medication(s)/side effects and non-pharmacologic comfort measures Outcome: Progressing   Problem: Health Behavior/Discharge Planning: Goal: Ability to manage health-related needs will improve Outcome: Progressing   Problem: Clinical Measurements: Goal: Ability to maintain clinical measurements within normal limits will improve Outcome: Progressing   Problem: Activity: Goal: Risk for activity intolerance will decrease Outcome: Progressing   Problem: Nutrition: Goal: Adequate nutrition will be maintained Outcome: Progressing   Problem: Coping: Goal: Level of anxiety will decrease Outcome: Progressing   Problem: Elimination: Goal: Will not experience complications related to bowel motility Outcome: Progressing Goal: Will not experience complications related to urinary retention Outcome: Progressing   Problem: Pain Management: Goal: General experience of comfort will improve Outcome: Progressing   Problem: Safety: Goal: Ability to remain free from injury will improve Outcome: Progressing   Problem: Skin Integrity: Goal: Risk for impaired skin integrity will decrease Outcome: Progressing

## 2023-07-13 NOTE — Progress Notes (Signed)
 Transition of Care Department [facility]) has reviewed patient and no other TOC needs have been identified at this time. We will continue to monitor patient advancement through interdisciplinary progression rounds. If new patient transition needs arise, please place a TOC consult.  07/13/23 1445  TOC Brief Assessment  Insurance and Status Reviewed  Patient has primary care physician Yes  Home environment has been reviewed Lives with sister.  Prior level of function: Fairly independent.  Prior/Current Home Services No current home services  Social Drivers of Health Review SDOH reviewed no interventions necessary  Readmission risk has been reviewed Yes  Transition of care needs no transition of care needs at this time

## 2023-07-14 ENCOUNTER — Telehealth: Payer: Self-pay | Admitting: Neurology

## 2023-07-14 DIAGNOSIS — J101 Influenza due to other identified influenza virus with other respiratory manifestations: Secondary | ICD-10-CM | POA: Diagnosis not present

## 2023-07-14 NOTE — Progress Notes (Signed)
 PROGRESS NOTE   Pamela Roman  FMW:[id number] DOB: 06-05-1956 DOA: 07/12/2023 PCP: Tobie Suzzane POUR, MD   CC--cough and weakness  Level of care: Med-Surg  Brief Admission History:  68 year old female with longstanding MS followed by Dr. Skeet with [facility] who is currently living with her sister with a history of bouts of MS flare presented to ED today with concern that she was having an MS flare.  She was brought to ED by [facility] because she has been having intermittent confusion and weakness in both legs for a few days.  She has had some fever at home and chest congestion with a temperature of 102 at home.  She says that she was told not to get vaccinated for influenza due to her MS.  She tested positive for influenza A.  She was sent for MRI of brain and spine but no evidence of MS flare on imaging. She is being admitted for supportive care due to her high risk of complication given immunocompromised state.    -Dispo--awaiting insurance authorization for discharge to SNF rehab   Assessment and Plan:  1)Influenza A infection -Chest x-ray without pneumonia - continue supportive measures  - acetaminophen  ordered for fever symptoms  -Respiratory symptoms improving- okay to complete Tamiflu  -No need for further IV fluids -No hypoxia   2)Generalized weakness/ambulatory dysfunction/multiple falls - secondary to influenza infection  - MRI testing stable without evidence of MS flare - continue supportive measures as ordered  -Physical therapist recommends SNF rehab  -Continue fall precautions   3)Altered mental status  - Pt seems to be back to baseline now - confusion likely was associated with fever and influenza infection   4)Hypothyroidism  - resume home levothyroxine  supplementation    DVT prophylaxis: enoxaparin  Code Status: Full  Family Communication: None at bedside Disposition: anticipate SNF rehab   Consultants:  PT/OT  Antivirals/Antimicrobials:  Tamiflu   1/6>>   Subjective: -Respiratory status improved -Generalized weakness and deconditioning persist--  Objective: Vitals:   07/13/23 1345 07/13/23 2112 07/14/23 0517 07/14/23 1358  BP: 99/65 (!) 96/57 123/65 112/72  Pulse: 81 73 86 78  Resp: 16 17 16 16   Temp: 98 F (36.7 C) 98.1 F (36.7 C) 98 F (36.7 C)   TempSrc:  Oral Oral   SpO2: 94% 99% 98% 97%  Weight:      Height:        Intake/Output Summary (Last 24 hours) at 07/14/2023 1811 Last data filed at 07/14/2023 1300 Gross per 24 hour  Intake 480 ml  Output 900 ml  Net -420 ml   Filed Weights   07/12/23 0800 07/12/23 0809 07/12/23 1734  Weight: 59.4 kg 59.9 kg 56.6 kg   Physical Exam Gen:- Awake Alert, in no acute distress  HEENT:- [place].AT, No sclera icterus Neck-Supple Neck,No JVD,.  Lungs-  CTAB , fair air movement bilaterally  CV- S1, S2 normal, RRR Abd-  +ve B.Sounds, Abd Soft, No tenderness,    Extremity/Skin:- No  edema,   good pedal pulses  Psych-affect is appropriate, oriented x3 Neuro-generalized weakness and deconditioning , chronic neuromuscular deficits due to underlying MS with motor strength 3/5 at baseline, no additional new focal deficits, no tremors  Data Reviewed: I have personally reviewed following labs and imaging studies  CBC: Recent Labs  Lab 07/12/23 0858  WBC 7.2  NEUTROABS 6.1  HGB 14.2  HCT 43.7  MCV 92.6  PLT 173    Basic Metabolic Panel: Recent Labs  Lab 07/12/23 0858 07/13/23 0404  NA 135 134*  K 3.5 3.3*  CL 106 105  CO2 21* 23  GLUCOSE 113* 77  BUN 22 26*  CREATININE 1.03* 0.76  CALCIUM 8.6* 8.0*  MG  --  2.1    Recent Results (from the past 240 hours)  Resp panel by RT-PCR (RSV, Flu A&B, Covid) Anterior Nasal Swab     Status: Abnormal   Collection Time: 07/12/23  8:14 AM   Specimen: Anterior Nasal Swab  Result Value Ref Range Status   SARS Coronavirus 2 by RT PCR NEGATIVE NEGATIVE Final    Comment: (NOTE) SARS-CoV-2 target nucleic acids are NOT  DETECTED.  The SARS-CoV-2 RNA is generally detectable in upper respiratory specimens during the acute phase of infection. The lowest concentration of SARS-CoV-2 viral copies this assay can detect is 138 copies/mL. A negative result does not preclude SARS-Cov-2 infection and should not be used as the sole basis for treatment or other patient management decisions. A negative result may occur with  improper specimen collection/handling, submission of specimen other than nasopharyngeal swab, presence of viral mutation(s) within the areas targeted by this assay, and inadequate number of viral copies(<138 copies/mL). A negative result must be combined with clinical observations, patient history, and epidemiological information. The expected result is Negative.  Fact Sheet for Patients:  bloggercourse.com  Fact Sheet for Healthcare Providers:  seriousbroker.it  This test is no t yet approved or cleared by the [place]  FDA and  has been authorized for detection and/or diagnosis of SARS-CoV-2 by FDA under an Emergency Use Authorization (EUA). This EUA will remain  in effect (meaning this test can be used) for the duration of the COVID-19 declaration under Section 564(b)(1) of the Act, 21 U.S.C.section 360bbb-3(b)(1), unless the authorization is terminated  or revoked sooner.       Influenza A by PCR POSITIVE (A) NEGATIVE Final   Influenza B by PCR NEGATIVE NEGATIVE Final    Comment: (NOTE) The Xpert Xpress SARS-CoV-2/FLU/RSV plus assay is intended as an aid in the diagnosis of influenza from Nasopharyngeal swab specimens and should not be used as a sole basis for treatment. Nasal washings and aspirates are unacceptable for Xpert Xpress SARS-CoV-2/FLU/RSV testing.  Fact Sheet for Patients: bloggercourse.com  Fact Sheet for Healthcare Providers: seriousbroker.it  This test is not  yet approved or cleared by the [place]  FDA and has been authorized for detection and/or diagnosis of SARS-CoV-2 by FDA under an Emergency Use Authorization (EUA). This EUA will remain in effect (meaning this test can be used) for the duration of the COVID-19 declaration under Section 564(b)(1) of the Act, 21 U.S.C. section 360bbb-3(b)(1), unless the authorization is terminated or revoked.     Resp Syncytial Virus by PCR NEGATIVE NEGATIVE Final    Comment: (NOTE) Fact Sheet for Patients: bloggercourse.com  Fact Sheet for Healthcare Providers: seriousbroker.it  This test is not yet approved or cleared by the [place]  FDA and has been authorized for detection and/or diagnosis of SARS-CoV-2 by FDA under an Emergency Use Authorization (EUA). This EUA will remain in effect (meaning this test can be used) for the duration of the COVID-19 declaration under Section 564(b)(1) of the Act, 21 U.S.C. section 360bbb-3(b)(1), unless the authorization is terminated or revoked.  Performed at [facility], [address], [place], [place] [address]     Radiology Studies: No results found.   Scheduled Meds:  acetaminophen   650 mg Oral TID   Or   acetaminophen   650 mg Rectal TID   cholecalciferol   5,000 Units Oral Daily   enoxaparin  (LOVENOX ) injection  40 mg Subcutaneous Q24H   famotidine   20 mg Oral QHS   levothyroxine   50 mcg Oral QAC breakfast   multivitamin with minerals  1 tablet Oral q AM   oseltamivir   30 mg Oral BID   sertraline   25 mg Oral Daily   simvastatin   20 mg Oral q1800   Continuous Infusions:   LOS: 2 days   Rendall Carwin, MD How to contact the [facility] Attending or Consulting provider 7A - 7P or covering provider during after hours 7P -7A, for this patient?   07/14/2023, 6:11 PM

## 2023-07-14 NOTE — Plan of Care (Signed)

## 2023-07-14 NOTE — NC FL2 (Signed)
 [place]  MEDICAID FL2 LEVEL OF CARE FORM     IDENTIFICATION  Patient Name: Pamela Roman Birthdate: 10/12/1955 Sex: female Admission Date (Current Location): 07/12/2023  [facility] and [place] Number:  [employer] and Address:  [facility],  [address] [place] [address]      Provider Number: [id number]  Attending Physician Name and Address:  Pearlean Manus, MD  Relative Name and Phone Number:       Current Level of Care: Hospital Recommended Level of Care: Skilled Nursing Facility Prior Approval Number:    Date Approved/Denied:   PASRR Number: pending  Discharge Plan: SNF    Current Diagnoses: Patient Active Problem List   Diagnosis Date Noted   Influenza A 07/12/2023   Generalized weakness 07/12/2023   Altered mental status 07/12/2023   Fever, unspecified 07/12/2023   Hypertrophic toenail 10/05/2022   Seasonal allergic rhinitis 10/05/2022   Multiple sclerosis exacerbation (HCC) 06/15/2022   Bilateral leg weakness 06/14/2022   Physical deconditioning 02/25/2022   Hospital discharge follow-up 02/25/2022   MDD (major depressive disorder), recurrent episode, moderate (HCC) 02/25/2022   Encounter for examination following treatment at hospital 07/16/2021   Pharyngoesophageal dysphagia 03/12/2021   Migraine 08/09/2020   Hypothyroidism 08/09/2020   HLD (hyperlipidemia) 08/09/2020   Osteoporosis 08/09/2020   Multiple falls 08/09/2020   Weight loss 08/09/2020   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   Spinal stenosis of lumbar region 03/21/2019   Multiple sclerosis (HCC) 11/15/2017   Thoracic compression fracture (HCC) 11/15/2017   Low back pain 11/15/2017    Orientation RESPIRATION BLADDER Height & Weight     Self, Time, Situation, Place  Normal External catheter Weight: 124 lb 12.5 oz (56.6 kg) Height:  5' 3 (160 cm)  BEHAVIORAL SYMPTOMS/MOOD NEUROLOGICAL BOWEL NUTRITION STATUS      Incontinent Diet (See d/c  summary)  AMBULATORY STATUS COMMUNICATION OF NEEDS Skin   Extensive Assist Verbally Normal                       Personal Care Assistance Level of Assistance  Bathing, Feeding, Dressing Bathing Assistance: Maximum assistance Feeding assistance: Limited assistance Dressing Assistance: Maximum assistance     Functional Limitations Info  Sight, Hearing, Speech Sight Info: Impaired Hearing Info: Adequate Speech Info: Adequate    SPECIAL CARE FACTORS FREQUENCY  PT (By licensed PT)     PT Frequency: 5x weekly              Contractures      Additional Factors Info  Code Status, Allergies, Psychotropic, Isolation Precautions Code Status Info: Full code Allergies Info: Codeine Psychotropic Info: Valium , Zoloft    Isolation Precautions Info: Droplet precautions flu+ 07/12/23     Current Medications (07/14/2023):  This is the current hospital active medication list Current Facility-Administered Medications  Medication Dose Route Frequency Provider Last Rate Last Admin   acetaminophen  (TYLENOL ) tablet 650 mg  650 mg Oral TID Vicci, Clanford L, MD   650 mg at 07/14/23 0825   Or   acetaminophen  (TYLENOL ) suppository 650 mg  650 mg Rectal TID Johnson, Clanford L, MD       albuterol  (PROVENTIL ) (2.5 MG/3ML) 0.083% nebulizer solution 2.5 mg  2.5 mg Nebulization Q4H PRN Johnson, Clanford L, MD       bisacodyl  (DULCOLAX) EC tablet 5 mg  5 mg Oral Daily PRN Johnson, Clanford L, MD       cholecalciferol  (VITAMIN D3) 25 MCG (1000 UNIT) tablet 5,000  Units  5,000 Units Oral Daily Vicci, Clanford L, MD   5,000 Units at 07/14/23 0825   dextromethorphan  (DELSYM ) 30 MG/5ML liquid 30 mg  30 mg Oral BID PRN Johnson, Clanford L, MD       enoxaparin  (LOVENOX ) injection 40 mg  40 mg Subcutaneous Q24H Johnson, Clanford L, MD   40 mg at 07/13/23 2200   famotidine  (PEPCID ) tablet 20 mg  20 mg Oral QHS Johnson, Clanford L, MD   20 mg at 07/13/23 2200   levothyroxine  (SYNTHROID ) tablet 50 mcg  50  mcg Oral QAC breakfast Vicci, Clanford L, MD   50 mcg at 07/14/23 9383   multivitamin with minerals tablet 1 tablet  1 tablet Oral q AM Vicci, Clanford L, MD   1 tablet at 07/14/23 0825   ondansetron  (ZOFRAN ) tablet 4 mg  4 mg Oral Q6H PRN Johnson, Clanford L, MD       Or   ondansetron  (ZOFRAN ) injection 4 mg  4 mg Intravenous Q6H PRN Johnson, Clanford L, MD   4 mg at 07/12/23 1806   oseltamivir  (TAMIFLU ) capsule 30 mg  30 mg Oral BID Johnson, Clanford L, MD   30 mg at 07/14/23 0825   oxyCODONE  (Oxy IR/ROXICODONE ) immediate release tablet 5 mg  5 mg Oral Q6H PRN Johnson, Clanford L, MD   5 mg at 07/12/23 1554   sertraline  (ZOLOFT ) tablet 25 mg  25 mg Oral Daily Johnson, Clanford L, MD   25 mg at 07/14/23 0825   simvastatin  (ZOCOR ) tablet 20 mg  20 mg Oral q1800 Johnson, Clanford L, MD   20 mg at 07/13/23 1628   SUMAtriptan  (IMITREX ) tablet 100 mg  100 mg Oral Q2H PRN Johnson, Clanford L, MD         Discharge Medications: Please see discharge summary for a list of discharge medications.  Relevant Imaging Results:  Relevant Lab Results:   Additional Information SSN: [id number]  Mcarthur Saddie Kim, LCSW

## 2023-07-14 NOTE — Telephone Encounter (Signed)
 Patient called and wanted to let Dr Everlena Cooper aware that she is in the hospital. Patient stated she has the flu and it has caused a bad MS flare up. Would like to hear from nurse about medications

## 2023-07-14 NOTE — Plan of Care (Signed)
  Problem: Acute Rehab PT Goals(only PT should resolve) Goal: Pt Will Go Supine/Side To Sit Flowsheets (Taken 07/14/2023 1133) Pt will go Supine/Side to Sit: Independently Goal: Pt Will Go Sit To Supine/Side Flowsheets (Taken 07/14/2023 1133) Pt will go Sit to Supine/Side: Independently Goal: Patient Will Perform Sitting Balance Flowsheets (Taken 07/14/2023 1133) Patient will perform sitting balance: Independently Goal: Patient Will Transfer Sit To/From Stand Flowsheets (Taken 07/14/2023 1133) Patient will transfer sit to/from stand: Independently Goal: Pt Will Transfer Bed To Chair/Chair To Bed Flowsheets (Taken 07/14/2023 1133) Pt will Transfer Bed to Chair/Chair to Bed: Independently  Omega JONETTA Bottcher PT, DPT [facility] [address] [phone] office

## 2023-07-14 NOTE — TOC Initial Note (Signed)
 Transition of Care [facility]) - Initial/Assessment Note    Patient Details  Name: Pamela Roman MRN: [id number] Date of Birth: [birth year]  [facility] CM/SW Contact:    Mcarthur Saddie Kim, LCSW Phone Number: 07/14/2023, 11:55 AM  Clinical Narrative:  Pt admitted due to influenza A. Pt lives with her sister and is fairly independent with ADLs. PT assessed pt and recommend SNF. Discussed with pt who is agreeable, requesting [facility] or [facility]. LCSW reviewed Medicare.gov ratings. TOC will initiate bed search.                  Expected Discharge Plan: Skilled Nursing Facility Barriers to Discharge: Continued Medical Work up   Patient Goals and CMS Choice Patient states their goals for this hospitalization and ongoing recovery are:: short term SNF   Choice offered to / list presented to : Patient [place] ownership interest in [facility].provided to:: Patient    Expected Discharge Plan and Services In-house Referral: Clinical Social Work   Post Acute Care Choice: Skilled Nursing Facility Living arrangements for the past 2 months: Single Family Home                                      Prior Living Arrangements/Services Living arrangements for the past 2 months: Single Family Home Lives with:: Siblings Patient language and need for interpreter reviewed:: Yes Do you feel safe going back to the place where you live?: Yes      Need for Family Participation in Patient Care: No (Comment)   Current home services: DME (walker, cane, wheelchair) Criminal Activity/Legal Involvement Pertinent to Current Situation/Hospitalization: No - Comment as needed  Activities of Daily Living   ADL Screening (condition at time of admission) Independently performs ADLs?: No Does the patient have a NEW difficulty with bathing/dressing/toileting/self-feeding that is expected to last >3 days?: No Does the patient have a NEW difficulty with getting in/out of bed,  walking, or climbing stairs that is expected to last >3 days?: No Does the patient have a NEW difficulty with communication that is expected to last >3 days?: No Is the patient deaf or have difficulty hearing?: No Does the patient have difficulty seeing, even when wearing glasses/contacts?: No Does the patient have difficulty concentrating, remembering, or making decisions?: No  Permission Sought/Granted                  Emotional Assessment     Affect (typically observed): Appropriate Orientation: : Oriented to Self, Oriented to Situation, Oriented to  Time, Oriented to Place Alcohol / Substance Use: Not Applicable Psych Involvement: No (comment)  Admission diagnosis:  Influenza A [J10.1] Influenza [J11.1] Patient Active Problem List   Diagnosis Date Noted   Influenza A 07/12/2023   Generalized weakness 07/12/2023   Altered mental status 07/12/2023   Fever, unspecified 07/12/2023   Hypertrophic toenail 10/05/2022   Seasonal allergic rhinitis 10/05/2022   Multiple sclerosis exacerbation (HCC) 06/15/2022   Bilateral leg weakness 06/14/2022   Physical deconditioning 02/25/2022   Hospital discharge follow-up 02/25/2022   MDD (major depressive disorder), recurrent episode, moderate (HCC) 02/25/2022   Encounter for examination following treatment at hospital 07/16/2021   Pharyngoesophageal dysphagia 03/12/2021   Migraine 08/09/2020   Hypothyroidism 08/09/2020   HLD (hyperlipidemia) 08/09/2020   Osteoporosis 08/09/2020   Multiple falls 08/09/2020   Weight loss 08/09/2020   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  Spinal stenosis of lumbar region 03/21/2019   Multiple sclerosis (HCC) 11/15/2017   Thoracic compression fracture (HCC) 11/15/2017   Low back pain 11/15/2017   PCP:  Tobie Suzzane POUR, MD Pharmacy:   [facility] [phone] - [place], [place] - [address] AT [facility] [address] [place] [address] Phone:  [phone] Fax: [phone]     Social Drivers of Health (SDOH) Social History: SDOH Screenings   Food Insecurity: No Food Insecurity (07/12/2023)  Housing: High Risk (07/12/2023)  Transportation Needs: No Transportation Needs (07/12/2023)  Utilities: Not At Risk (07/12/2023)  Alcohol Screen: Low Risk  (12/09/2022)  Depression (PHQ2-9): Low Risk  (12/09/2022)  Financial Resource Strain: Low Risk  (12/09/2022)  Physical Activity: Insufficiently Active (12/09/2022)  Social Connections: Moderately Isolated (07/12/2023)  Stress: No Stress Concern Present (12/09/2022)  Tobacco Use: Low Risk  (07/12/2023)   SDOH Interventions:     Readmission Risk Interventions     No data to display

## 2023-07-14 NOTE — Progress Notes (Signed)
 DOB: [birth year]  Date: 07/14/23   Must ID: [id number]   To Whom it May Concern:  Please be advised that the above named patient will require a short-term nursing home stay- anticipated 30 days or less rehabilitation and strengthening. The plan is for return home.

## 2023-07-14 NOTE — Telephone Encounter (Signed)
 Pt called no answer per DPR left  voice mail that DR Skeet is out of the office and that the providers in the hospital are the ones that will be taken care of her while she is there, when she gets out of the hospital she can call the office to schedule an appointment to see Dr Skeet

## 2023-07-14 NOTE — Evaluation (Addendum)
 Physical Therapy Evaluation Patient Details Name: Pamela Roman MRN: [id number] DOB: [birth year] Today's Date: 07/14/2023  History of Present Illness  68 year old female with longstanding MS followed by Dr. Skeet with [facility] who is currently living with her sister with a history of bouts of MS flare presented to ED today with concern that she was having an MS flare.  She was brought to ED by [facility] because she has been having intermittent confusion and weakness in both legs for a few days.  She has had some fever at home and chest congestion with a temperature of 102 at home.  She says that she was told not to get vaccinated for influenza due to her MS.  She tested positive for influenza A.  She was sent for MRI of brain and spine but no evidence of MS flare on imaging. She is being admitted for supportive care due to her high risk of complication given immunocompromised state.  Clinical Impression   Pt tolerated today's Physical Therapy Evaluation, with carryover for transfers from edge of bed to bedside moving back to bed.  Patient reporting that at baseline she is independent for transfers to a power wheelchair, which is means for all mobility, min independent for transfers for ADLs.  Patient reports having caregiver twice a week for 3 hours to assist with laundry.  At current level, patient is max assist for standing with max assist for squat pivot transfers to very sitting positions for bathroom use and PERI hygiene.. Pt demonstrating significant limitations in ADLs, functional transfers, and bed mobility due to new onset of muscle weakness, due to p.m H of multiple sclerosis. Based upon these deficits/impairments, patient will benefit from continued skilled physical therapy services during remainder of hospital stay and at the next recommended venue of care to address deficits and promote return to optimal function.                If plan is discharge home, recommend the following: A  lot of help with walking and/or transfers;Two people to help with bathing/dressing/bathroom   Can travel by private vehicle   No    Equipment Recommendations None recommended by PT  Recommendations for Other Services       Functional Status Assessment Patient has had a recent decline in their functional status and demonstrates the ability to make significant improvements in function in a reasonable and predictable amount of time.     Precautions / Restrictions Precautions Precautions: Fall Restrictions Weight Bearing Restrictions Per Provider Order: No      Mobility  Bed Mobility Overal bed mobility: Needs Assistance Bed Mobility: Sidelying to Sit, Supine to Sit, Sit to Supine   Sidelying to sit: Mod assist Supine to sit: Mod assist Sit to supine: Mod assist   General bed mobility comments: mod assist for trunk elevation, reduced UB strength. rolled supine to side to sitting with flat HOB. return to supine mod assist for controlled descent, assist at BLE for centered in bed. NT assist at EOS for lateral scooting. Patient Response: Cooperative  Transfers Overall transfer level: Needs assistance Equipment used: None Transfers: Sit to/from Stand, Bed to chair/wheelchair/BSC Sit to Stand: Max assist     Squat pivot transfers: Max assist     General transfer comment: multiple sit/stand transfers from EOB<>recliner<> BSC- max assist with blocking at bilateral knees and powerup to stand with BUE support on surfaces. 3x squat pivot transfers from EOB to recliner to [facility] back to recliner and then to  EOB. max assist with pt utilizing head hip relationship appropriately with UE assist at varius sitting positions. NT called in to assist with finishing peri-hygiene, completed partially but needed assist to finish and clean.    Ambulation/Gait                  Stairs            Wheelchair Mobility     Tilt Bed Tilt Bed Patient Response: Cooperative  Modified  Rankin (Stroke Patients Only)       Balance Overall balance assessment: Needs assistance Sitting-balance support: No upper extremity supported Sitting balance-Leahy Scale: Fair Sitting balance - Comments: small, multiple posterior falls at EOB with no back support-poor. When sitting on supported surface fair.   Standing balance support: During functional activity, Bilateral upper extremity supported Standing balance-Leahy Scale: Zero Standing balance comment: max assist in squat pivot tranfsers                             Pertinent Vitals/Pain Pain Assessment Pain Assessment: No/denies pain    Home Living Family/patient expects to be discharged to:: Private residence Living Arrangements: Other relatives Available Help at Discharge: Available PRN/intermittently;Personal care attendant Type of Home: House Home Access: Ramped entrance       Home Layout: Two level;Able to live on main level with bedroom/bathroom Home Equipment: Rolling Walker (2 wheels);Shower seat - built in;Grab bars - toilet;Grab bars - tub/shower;Hand held shower head;Wheelchair - power      Prior Function Prior Level of Function : Needs assist       Physical Assist : Mobility (physical);ADLs (physical) Mobility (physical): Bed mobility;Gait;Transfers;Stairs   Mobility Comments: Use power WC for all mobility, stand pivot transfer to RW from bed. ADLs Comments: Aid comes in 2x/week for 3 hours to assist with laundry, cleaning. Pt reports independence with other ADLs inlcuding, dressing, bathring, and feeding     Extremity/Trunk Assessment   Upper Extremity Assessment Upper Extremity Assessment: Defer to OT evaluation    Lower Extremity Assessment Lower Extremity Assessment: Generalized weakness;RLE deficits/detail;LLE deficits/detail RLE Deficits / Details: 2+/5 knee extension and ankle dorsiflexion RLE Sensation: WNL LLE Deficits / Details: 2-/5 knee extension and ankle  dorsiflexion LLE Sensation: WNL    Cervical / Trunk Assessment Cervical / Trunk Assessment: Kyphotic  Communication   Communication Communication: No apparent difficulties Cueing Techniques: Verbal cues;Tactile cues  Cognition Arousal: Alert Behavior During Therapy: WFL for tasks assessed/performed Overall Cognitive Status: Within Functional Limits for tasks assessed                                          General Comments      Exercises     Assessment/Plan    PT Assessment Patient needs continued PT services  PT Problem List Decreased strength;Decreased range of motion;Decreased balance;Decreased mobility       PT Treatment Interventions DME instruction;Gait training;Functional mobility training;Therapeutic activities;Therapeutic exercise;Balance training    PT Goals (Current goals can be found in the Care Plan section)  Acute Rehab PT Goals Patient Stated Goal: rehab then return home PT Goal Formulation: With patient Time For Goal Achievement: 07/28/23 Potential to Achieve Goals: Good    Frequency Min 3X/week     Co-evaluation               AM-PAC PT 6 Clicks  Mobility  Outcome Measure Help needed turning from your back to your side while in a flat bed without using bedrails?: A Lot Help needed moving from lying on your back to sitting on the side of a flat bed without using bedrails?: A Lot Help needed moving to and from a bed to a chair (including a wheelchair)?: A Lot Help needed standing up from a chair using your arms (e.g., wheelchair or bedside chair)?: A Lot Help needed to walk in hospital room?: Total Help needed climbing 3-5 steps with a railing? : Total 6 Click Score: 10    End of Session Equipment Utilized During Treatment: Gait belt Activity Tolerance: Patient tolerated treatment well Patient left: in bed;with call bell/phone within reach;with bed alarm set Nurse Communication: Mobility status PT Visit Diagnosis:  Unsteadiness on feet (R26.81);Muscle weakness (generalized) (M62.81)    Time: 8968-8885 PT Time Calculation (min) (ACUTE ONLY): 43 min   Charges:   PT Evaluation $PT Eval Moderate Complexity: 1 Mod PT Treatments $Therapeutic Activity: 23-37 mins PT General Charges $$ ACUTE PT VISIT: 1 Visit         Omega JONETTA Donna ALMETA, DPT [facility] [place] [address] [phone] office  Omega JONETTA Donna 07/14/2023, 11:29 AM

## 2023-07-15 ENCOUNTER — Telehealth: Payer: Self-pay | Admitting: Neurology

## 2023-07-15 DIAGNOSIS — J101 Influenza due to other identified influenza virus with other respiratory manifestations: Secondary | ICD-10-CM | POA: Diagnosis not present

## 2023-07-15 MED ORDER — MENTHOL 3 MG MT LOZG
1.0000 | LOZENGE | OROMUCOSAL | Status: DC | PRN
  Administered 2023-07-15: 3 mg via ORAL
  Filled 2023-07-15 (×2): qty 9

## 2023-07-15 NOTE — Evaluation (Signed)
 Occupational Therapy Evaluation Patient Details Name: Pamela Roman MRN: [id number] DOB: [birth year] Today's Date: 07/15/2023   History of Present Illness 68 year old female with longstanding MS followed by Dr. Skeet with [facility] who is currently living with her sister with a history of bouts of MS flare presented to ED today with concern that she was having an MS flare.  She was brought to ED by [facility] because she has been having intermittent confusion and weakness in both legs for a few days.  She has had some fever at home and chest congestion with a temperature of 102 at home.  She says that she was told not to get vaccinated for influenza due to her MS.  She tested positive for influenza A.  She was sent for MRI of brain and spine but no evidence of MS flare on imaging. She is being admitted for supportive care due to her high risk of complication given immunocompromised state.   Clinical Impression   Pt admitted for concerns listed above. PTA pt reported that she was independent with all ADL's and IADL's, including some cooking and low level cleaning. At this time, she is limited by weakness and poor balance. She is requiring min assist for bed mobility, mod A for standing, and up to heavy mod A for all LB ADL's. Recommending continued skilled OT to maximize her independence and safety, as well as reduce caregiver burden.        If plan is discharge home, recommend the following: A lot of help with walking and/or transfers;A lot of help with bathing/dressing/bathroom;Assistance with cooking/housework;Help with stairs or ramp for entrance    Functional Status Assessment  Patient has had a recent decline in their functional status and demonstrates the ability to make significant improvements in function in a reasonable and predictable amount of time.  Equipment Recommendations  None recommended by OT    Recommendations for Other Services       Precautions / Restrictions  Precautions Precautions: Fall Restrictions Weight Bearing Restrictions Per Provider Order: No      Mobility Bed Mobility Overal bed mobility: Needs Assistance Bed Mobility: Supine to Sit, Sit to Supine     Supine to sit: Min assist Sit to supine: Min assist   General bed mobility comments: Min A with increased effort to come to sitting and return to supine    Transfers Overall transfer level: Needs assistance Equipment used: Rolling walker (2 wheels) Transfers: Sit to/from Stand Sit to Stand: Mod assist           General transfer comment: Mod A to stand EOB with RW      Balance Overall balance assessment: Needs assistance Sitting-balance support: Bilateral upper extremity supported Sitting balance-Leahy Scale: Fair Sitting balance - Comments: initial posterior LOB, however once both feet on the floor, she was able to stabilize her balance Postural control: Posterior lean Standing balance support: During functional activity, Bilateral upper extremity supported Standing balance-Leahy Scale: Poor Standing balance comment: mod assist with RW                           ADL either performed or assessed with clinical judgement   ADL Overall ADL's : Needs assistance/impaired Eating/Feeding: Set up;Sitting   Grooming: Set up;Sitting   Upper Body Bathing: Minimal assistance;Sitting   Lower Body Bathing: Moderate assistance;Sitting/lateral leans   Upper Body Dressing : Minimal assistance;Sitting   Lower Body Dressing: Moderate assistance;Sitting/lateral leans   Toilet  Transfer: Moderate assistance;Stand-pivot   Toileting- Clothing Manipulation and Hygiene: Moderate assistance;Sitting/lateral lean         General ADL Comments: Increased assist due to weakness and poor balance     Vision Baseline Vision/History: 0 No visual deficits Ability to See in Adequate Light: 0 Adequate Patient Visual Report: No change from baseline Vision Assessment?: No  apparent visual deficits     Perception         Praxis         Pertinent Vitals/Pain Pain Assessment Pain Assessment: No/denies pain     Extremity/Trunk Assessment Upper Extremity Assessment Upper Extremity Assessment: Overall WFL for tasks assessed   Lower Extremity Assessment Lower Extremity Assessment: Defer to PT evaluation   Cervical / Trunk Assessment Cervical / Trunk Assessment: Kyphotic   Communication Communication Communication: No apparent difficulties   Cognition Arousal: Alert Behavior During Therapy: WFL for tasks assessed/performed Overall Cognitive Status: Within Functional Limits for tasks assessed                                       General Comments  VSS onRA    Exercises     Shoulder Instructions      Home Living Family/patient expects to be discharged to:: Private residence Living Arrangements: Other relatives Available Help at Discharge: Available PRN/intermittently;Personal care attendant Type of Home: House Home Access: Ramped entrance     Home Layout: Two level;Able to live on main level with bedroom/bathroom     Bathroom Shower/Tub: Chief Strategy Officer: Standard Bathroom Accessibility: Yes How Accessible: Accessible via wheelchair Home Equipment: Rolling Walker (2 wheels);Shower seat - built in;Grab bars - toilet;Grab bars - tub/shower;Hand held shower head;Wheelchair - power          Prior Functioning/Environment Prior Level of Function : Needs assist             Mobility Comments: Use power WC for all mobility, stand pivot transfer to RW from bed. ADLs Comments: Aid comes in 2x/week for 3 hours to assist with laundry, cleaning. Pt reports independence with other ADLs inlcuding, dressing, bathring, and feeding        OT Problem List: Decreased strength;Decreased range of motion;Decreased activity tolerance;Impaired balance (sitting and/or standing)      OT Treatment/Interventions:  Self-care/ADL training;Therapeutic exercise;DME and/or AE instruction;Energy conservation;Therapeutic activities;Patient/family education;Balance training    OT Goals(Current goals can be found in the care plan section) Acute Rehab OT Goals Patient Stated Goal: To get her strength back OT Goal Formulation: With patient Time For Goal Achievement: 07/29/23 Potential to Achieve Goals: Good ADL Goals Pt Will Perform Grooming: Independently;sitting Pt Will Perform Lower Body Bathing: with contact guard assist;sitting/lateral leans;sit to/from stand Pt Will Perform Lower Body Dressing: with contact guard assist;sitting/lateral leans;sit to/from stand Pt Will Transfer to Toilet: with contact guard assist;stand pivot transfer Pt Will Perform Toileting - Clothing Manipulation and hygiene: with contact guard assist;sitting/lateral leans;sit to/from stand  OT Frequency: Min 2X/week    Co-evaluation              AM-PAC OT 6 Clicks Daily Activity     Outcome Measure Help from another person eating meals?: A Little Help from another person taking care of personal grooming?: A Little Help from another person toileting, which includes using toliet, bedpan, or urinal?: A Lot Help from another person bathing (including washing, rinsing, drying)?: A Lot Help from another  person to put on and taking off regular upper body clothing?: A Little Help from another person to put on and taking off regular lower body clothing?: A Lot 6 Click Score: 15   End of Session Equipment Utilized During Treatment: Gait belt;Rolling walker (2 wheels) Nurse Communication: Mobility status  Activity Tolerance: Patient tolerated treatment well Patient left: in bed;with call bell/phone within reach;with bed alarm set  OT Visit Diagnosis: Unsteadiness on feet (R26.81);Other abnormalities of gait and mobility (R26.89);Muscle weakness (generalized) (M62.81)                Time: 8472-8393 OT Time Calculation (min): 39  min Charges:  OT General Charges $OT Visit: 1 Visit OT Evaluation $OT Eval Moderate Complexity: 1 Mod OT Treatments $Self Care/Home Management : 23-37 mins  Valentin Nightingale, OTR/L [facility] Acute Rehabilitation  Kailee Essman Elane Nightingale 07/15/2023, 4:52 PM

## 2023-07-15 NOTE — TOC Progression Note (Signed)
 Transition of Care [facility]) - Progression Note    Patient Details  Name: Pamela Roman MRN: [id number] Date of Birth: Apr 16, 1956  Transition of [facility]) CM/SW Contact  Mcarthur Saddie Kim, [place] Phone Number: 07/15/2023, 1:58 PM  Clinical Narrative:  Pt accepts bed at [facility]. Facility notified and can accept pt on Sunday due to flu quarantine. Pt aware. MD updated.      Expected Discharge Plan: Skilled Nursing Facility Barriers to Discharge: Continued Medical Work up  Expected Discharge Plan and Services In-house Referral: Clinical Social Work   Post Acute Care Choice: Skilled Nursing Facility Living arrangements for the past 2 months: Single Family Home                                       Social Determinants of Health (SDOH) Interventions SDOH Screenings   Food Insecurity: No Food Insecurity (07/12/2023)  Housing: High Risk (07/12/2023)  Transportation Needs: No Transportation Needs (07/12/2023)  Utilities: Not At Risk (07/12/2023)  Alcohol Screen: Low Risk  (12/09/2022)  Depression (PHQ2-9): Low Risk  (12/09/2022)  Financial Resource Strain: Low Risk  (12/09/2022)  Physical Activity: Insufficiently Active (12/09/2022)  Social Connections: Moderately Isolated (07/12/2023)  Stress: No Stress Concern Present (12/09/2022)  Tobacco Use: Low Risk  (07/12/2023)    Readmission Risk Interventions     No data to display

## 2023-07-15 NOTE — Telephone Encounter (Signed)
 Patient is in hospital. She wants to know if jaffe would be okay doing a VV for her 07/22/23 appt.

## 2023-07-15 NOTE — Progress Notes (Signed)
 PROGRESS NOTE   MATRACA HUNKINS  FMW:[id number] DOB: June 10, 1956 DOA: 07/12/2023 PCP: Tobie Suzzane POUR, MD   CC--cough and weakness  Level of care: Med-Surg  Brief Admission History:  68 year old female with longstanding MS followed by Dr. Skeet with [facility] who is currently living with her sister with a history of bouts of MS flare presented to ED today with concern that she was having an MS flare.  She was brought to ED by [facility] because she has been having intermittent confusion and weakness in both legs for a few days.  She has had some fever at home and chest congestion with a temperature of 102 at home.  She says that she was told not to get vaccinated for influenza due to her MS.  She tested positive for influenza A.  She was sent for MRI of brain and spine but no evidence of MS flare on imaging. She is being admitted for supportive care due to her high risk of complication given immunocompromised state.    -Disposition--Remains medically stable to transfer to SNF rehab, awaiting insurance authorization for discharge to SNF rehab   Assessment and Plan:  1)Influenza A infection -Chest x-ray without Pneumonia - continue supportive measures  -Respiratory symptoms improving- okay to complete Tamiflu  -No need for further IV fluids -No hypoxia -Respiratory (flu) symptoms have improved significantly   2)Generalized weakness/ambulatory dysfunction/multiple falls - secondary to influenza infection  - Pan MRI testing stable without evidence of MS flare - continue supportive measures as ordered  -Physical therapist recommends SNF rehab  -Continue fall precautions   3)Altered mental status  - Pt seems to be back to baseline now - confusion likely was associated with fever and influenza infection   4)Hypothyroidism  - resume home levothyroxine  supplementation    DVT prophylaxis: enoxaparin  Code Status: Full  Family Communication: None at bedside Disposition: anticipate SNF  rehab   Consultants:  PT/OT  Antivirals/Antimicrobials:  Tamiflu  1/6>>   Subjective: -Sitting up in a chair -Denies significant cough -No fever  Or chills  -Reports generalized weakness and fatigue - -Disposition--Remains medically stable to transfer to SNF rehab, awaiting insurance authorization for discharge to SNF rehab  Objective: Vitals:   07/14/23 1358 07/14/23 2043 07/15/23 0506 07/15/23 1343  BP: 112/72 109/64 (!) 103/55 96/70  Pulse: 78 72 71 74  Resp: 16 18 18    Temp:  98 F (36.7 C) 98.9 F (37.2 C) 97.9 F (36.6 C)  TempSrc:  Oral  Oral  SpO2: 97% 98% 99% 98%  Weight:      Height:        Intake/Output Summary (Last 24 hours) at 07/15/2023 1735 Last data filed at 07/15/2023 1316 Gross per 24 hour  Intake 720 ml  Output 400 ml  Net 320 ml   Filed Weights   07/12/23 0800 07/12/23 0809 07/12/23 1734  Weight: 59.4 kg 59.9 kg 56.6 kg   Physical Exam Gen:- Awake Alert, in no acute distress  HEENT:- [place].AT, No sclera icterus Neck-Supple Neck,No JVD,.  Lungs-  CTAB , fair air movement bilaterally  CV- S1, S2 normal, RRR Abd-  +ve B.Sounds, Abd Soft, No tenderness,    Extremity/Skin:- No  edema,   good pedal pulses  Psych-affect is appropriate, oriented x3 Neuro-Generalized weakness and deconditioning , chronic neuromuscular deficits due to underlying MS with motor strength 3/5 at baseline, no additional new focal deficits, no tremors  Data Reviewed: I have personally reviewed following labs and imaging studies  CBC: Recent Labs  Lab 07/12/23 0858  WBC 7.2  NEUTROABS 6.1  HGB 14.2  HCT 43.7  MCV 92.6  PLT 173   Basic Metabolic Panel: Recent Labs  Lab 07/12/23 0858 07/13/23 0404  NA 135 134*  K 3.5 3.3*  CL 106 105  CO2 21* 23  GLUCOSE 113* 77  BUN 22 26*  CREATININE 1.03* 0.76  CALCIUM 8.6* 8.0*  MG  --  2.1   Recent Results (from the past 240 hours)  Resp panel by RT-PCR (RSV, Flu A&B, Covid) Anterior Nasal Swab     Status: Abnormal    Collection Time: 07/12/23  8:14 AM   Specimen: Anterior Nasal Swab  Result Value Ref Range Status   SARS Coronavirus 2 by RT PCR NEGATIVE NEGATIVE Final    Comment: (NOTE) SARS-CoV-2 target nucleic acids are NOT DETECTED.  The SARS-CoV-2 RNA is generally detectable in upper respiratory specimens during the acute phase of infection. The lowest concentration of SARS-CoV-2 viral copies this assay can detect is 138 copies/mL. A negative result does not preclude SARS-Cov-2 infection and should not be used as the sole basis for treatment or other patient management decisions. A negative result may occur with  improper specimen collection/handling, submission of specimen other than nasopharyngeal swab, presence of viral mutation(s) within the areas targeted by this assay, and inadequate number of viral copies(<138 copies/mL). A negative result must be combined with clinical observations, patient history, and epidemiological information. The expected result is Negative.  Fact Sheet for Patients:  bloggercourse.com  Fact Sheet for Healthcare Providers:  seriousbroker.it  This test is no t yet approved or cleared by the [place]  FDA and  has been authorized for detection and/or diagnosis of SARS-CoV-2 by FDA under an Emergency Use Authorization (EUA). This EUA will remain  in effect (meaning this test can be used) for the duration of the COVID-19 declaration under Section 564(b)(1) of the Act, 21 U.S.C.section 360bbb-3(b)(1), unless the authorization is terminated  or revoked sooner.       Influenza A by PCR POSITIVE (A) NEGATIVE Final   Influenza B by PCR NEGATIVE NEGATIVE Final    Comment: (NOTE) The Xpert Xpress SARS-CoV-2/FLU/RSV plus assay is intended as an aid in the diagnosis of influenza from Nasopharyngeal swab specimens and should not be used as a sole basis for treatment. Nasal washings and aspirates are unacceptable for  Xpert Xpress SARS-CoV-2/FLU/RSV testing.  Fact Sheet for Patients: bloggercourse.com  Fact Sheet for Healthcare Providers: seriousbroker.it  This test is not yet approved or cleared by the [place]  FDA and has been authorized for detection and/or diagnosis of SARS-CoV-2 by FDA under an Emergency Use Authorization (EUA). This EUA will remain in effect (meaning this test can be used) for the duration of the COVID-19 declaration under Section 564(b)(1) of the Act, 21 U.S.C. section 360bbb-3(b)(1), unless the authorization is terminated or revoked.     Resp Syncytial Virus by PCR NEGATIVE NEGATIVE Final    Comment: (NOTE) Fact Sheet for Patients: bloggercourse.com  Fact Sheet for Healthcare Providers: seriousbroker.it  This test is not yet approved or cleared by the [place]  FDA and has been authorized for detection and/or diagnosis of SARS-CoV-2 by FDA under an Emergency Use Authorization (EUA). This EUA will remain in effect (meaning this test can be used) for the duration of the COVID-19 declaration under Section 564(b)(1) of the Act, 21 U.S.C. section 360bbb-3(b)(1), unless the authorization is terminated or revoked.  Performed at [facility], [address], [place], [place] [address]  Scheduled Meds:  acetaminophen   650 mg Oral TID   Or   acetaminophen   650 mg Rectal TID   cholecalciferol   5,000 Units Oral Daily   enoxaparin  (LOVENOX ) injection  40 mg Subcutaneous Q24H   famotidine   20 mg Oral QHS   levothyroxine   50 mcg Oral QAC breakfast   multivitamin with minerals  1 tablet Oral q AM   oseltamivir   30 mg Oral BID   sertraline   25 mg Oral Daily   simvastatin   20 mg Oral q1800   Continuous Infusions:   LOS: 3 days   Rendall Carwin, MD How to contact the [facility] Attending or Consulting provider 7A - 7P or covering provider during after hours 7P -7A, for  this patient?   07/15/2023, 5:35 PM

## 2023-07-16 DIAGNOSIS — J101 Influenza due to other identified influenza virus with other respiratory manifestations: Secondary | ICD-10-CM | POA: Diagnosis not present

## 2023-07-16 NOTE — Care Management Important Message (Signed)
 Important Message  Patient Details  Name: Pamela Roman MRN: [id number] Date of Birth: 23-Feb-1956   Important Message Given:  Yes - Medicare IM (verbally reviewed letter by phone at [phone], no additonal copy needed)     Pamela Roman L Pamela Roman 07/16/2023, 11:59 AM

## 2023-07-16 NOTE — Plan of Care (Signed)
 Pt alert and oriented x 4. UP with assist increased weakness in legs. Vitals stable. No prns given Problem: Education: Goal: Knowledge of General Education information will improve Description: Including pain rating scale, medication(s)/side effects and non-pharmacologic comfort measures Outcome: Progressing   Problem: Health Behavior/Discharge Planning: Goal: Ability to manage health-related needs will improve Outcome: Progressing   Problem: Clinical Measurements: Goal: Ability to maintain clinical measurements within normal limits will improve Outcome: Progressing Goal: Will remain free from infection Outcome: Progressing Goal: Diagnostic test results will improve Outcome: Progressing Goal: Respiratory complications will improve Outcome: Progressing Goal: Cardiovascular complication will be avoided Outcome: Progressing   Problem: Activity: Goal: Risk for activity intolerance will decrease Outcome: Progressing   Problem: Nutrition: Goal: Adequate nutrition will be maintained Outcome: Progressing   Problem: Coping: Goal: Level of anxiety will decrease Outcome: Progressing   Problem: Elimination: Goal: Will not experience complications related to bowel motility Outcome: Progressing Goal: Will not experience complications related to urinary retention Outcome: Progressing   Problem: Pain Management: Goal: General experience of comfort will improve Outcome: Progressing   Problem: Safety: Goal: Ability to remain free from injury will improve Outcome: Progressing   Problem: Skin Integrity: Goal: Risk for impaired skin integrity will decrease Outcome: Progressing

## 2023-07-16 NOTE — Progress Notes (Signed)
 PROGRESS NOTE   Pamela Roman  FMW:[id number] DOB: [birth year] DOA: 07/12/2023 PCP: Tobie Suzzane POUR, MD   CC--cough and weakness  Level of care: Med-Surg  Brief Admission History:  68 year old female with longstanding MS followed by Dr. Skeet with [facility] who is currently living with her sister with a history of bouts of MS flare presented to ED today with concern that she was having an MS flare.  She was brought to ED by [facility] because she has been having intermittent confusion and weakness in both legs for a few days.  She has had some fever at home and chest congestion with a temperature of 102 at home.  She says that she was told not to get vaccinated for influenza due to her MS.  She tested positive for influenza A.  She was sent for MRI of brain and spine but no evidence of MS flare on imaging. She is being admitted for supportive care due to her high risk of complication given immunocompromised state.    -Disposition--Remains medically stable to transfer to SNF rehab, awaiting insurance authorization for discharge to SNF rehab   Assessment and Plan:  1)Influenza A infection -Chest x-ray without Pneumonia - continue supportive measures  okay to complete Tamiflu  -No need for further IV fluids -No hypoxia -Respiratory (flu) symptoms have improved significantly   2)Generalized weakness/ambulatory dysfunction/multiple falls - secondary to influenza infection  - Pan MRI testing stable without evidence of MS flare - continue supportive measures as ordered  -Physical therapist recommends SNF rehab  -Continue fall precautions -Outpatient follow-up with primary neurologist Dr. Skeet   3) acute metabolic encephalopathy--resolved - Pt seems to be back to baseline now - confusion likely was associated with fever and influenza infection   4)Hypothyroidism  - resume home levothyroxine  supplementation    DVT prophylaxis: enoxaparin  Code Status: Full  Family Communication: None  at bedside Disposition: anticipate SNF rehab   Consultants:  PT/OT  Antivirals/Antimicrobials:  Tamiflu  1/6>>   Subjective:  -had normal BM -Eating and drinking well -Fatigue and weakness persist - -Disposition--Remains medically stable to transfer to SNF rehab, awaiting insurance authorization for discharge to SNF rehab  Objective: Vitals:   07/15/23 0506 07/15/23 1343 07/15/23 2039 07/16/23 0426  BP: (!) 103/55 96/70 94/61  108/78  Pulse: 71 74 68 71  Resp: 18  20 17   Temp: 98.9 F (37.2 C) 97.9 F (36.6 C) 97.8 F (36.6 C) 97.8 F (36.6 C)  TempSrc:  Oral Oral Oral  SpO2: 99% 98% 96% 96%  Weight:      Height:        Intake/Output Summary (Last 24 hours) at 07/16/2023 1431 Last data filed at 07/16/2023 0900 Gross per 24 hour  Intake 360 ml  Output --  Net 360 ml   Filed Weights   07/12/23 0800 07/12/23 0809 07/12/23 1734  Weight: 59.4 kg 59.9 kg 56.6 kg   Physical Exam Gen:- Awake Alert, in no acute distress  HEENT:- [place].AT, No sclera icterus Neck-Supple Neck,No JVD,.  Lungs-  CTAB , fair air movement bilaterally  CV- S1, S2 normal, RRR Abd-  +ve B.Sounds, Abd Soft, No tenderness,    Extremity/Skin:- No  edema,   good pedal pulses  Psych-affect is appropriate, oriented x3 Neuro-Generalized weakness and deconditioning , chronic neuromuscular deficits due to underlying MS with motor strength 3/5 at baseline, no additional new focal deficits, no tremors  Data Reviewed: I have personally reviewed following labs and imaging studies  CBC: Recent Labs  Lab 07/12/23 0858  WBC 7.2  NEUTROABS 6.1  HGB 14.2  HCT 43.7  MCV 92.6  PLT 173   Basic Metabolic Panel: Recent Labs  Lab 07/12/23 0858 07/13/23 0404  NA 135 134*  K 3.5 3.3*  CL 106 105  CO2 21* 23  GLUCOSE 113* 77  BUN 22 26*  CREATININE 1.03* 0.76  CALCIUM 8.6* 8.0*  MG  --  2.1   Recent Results (from the past 240 hours)  Resp panel by RT-PCR (RSV, Flu A&B, Covid) Anterior Nasal Swab      Status: Abnormal   Collection Time: 07/12/23  8:14 AM   Specimen: Anterior Nasal Swab  Result Value Ref Range Status   SARS Coronavirus 2 by RT PCR NEGATIVE NEGATIVE Final    Comment: (NOTE) SARS-CoV-2 target nucleic acids are NOT DETECTED.  The SARS-CoV-2 RNA is generally detectable in upper respiratory specimens during the acute phase of infection. The lowest concentration of SARS-CoV-2 viral copies this assay can detect is 138 copies/mL. A negative result does not preclude SARS-Cov-2 infection and should not be used as the sole basis for treatment or other patient management decisions. A negative result may occur with  improper specimen collection/handling, submission of specimen other than nasopharyngeal swab, presence of viral mutation(s) within the areas targeted by this assay, and inadequate number of viral copies(<138 copies/mL). A negative result must be combined with clinical observations, patient history, and epidemiological information. The expected result is Negative.  Fact Sheet for Patients:  bloggercourse.com  Fact Sheet for Healthcare Providers:  seriousbroker.it  This test is no t yet approved or cleared by the [place]  FDA and  has been authorized for detection and/or diagnosis of SARS-CoV-2 by FDA under an Emergency Use Authorization (EUA). This EUA will remain  in effect (meaning this test can be used) for the duration of the COVID-19 declaration under Section 564(b)(1) of the Act, 21 U.S.C.section 360bbb-3(b)(1), unless the authorization is terminated  or revoked sooner.       Influenza A by PCR POSITIVE (A) NEGATIVE Final   Influenza B by PCR NEGATIVE NEGATIVE Final    Comment: (NOTE) The Xpert Xpress SARS-CoV-2/FLU/RSV plus assay is intended as an aid in the diagnosis of influenza from Nasopharyngeal swab specimens and should not be used as a sole basis for treatment. Nasal washings and aspirates  are unacceptable for Xpert Xpress SARS-CoV-2/FLU/RSV testing.  Fact Sheet for Patients: bloggercourse.com  Fact Sheet for Healthcare Providers: seriousbroker.it  This test is not yet approved or cleared by the [place]  FDA and has been authorized for detection and/or diagnosis of SARS-CoV-2 by FDA under an Emergency Use Authorization (EUA). This EUA will remain in effect (meaning this test can be used) for the duration of the COVID-19 declaration under Section 564(b)(1) of the Act, 21 U.S.C. section 360bbb-3(b)(1), unless the authorization is terminated or revoked.     Resp Syncytial Virus by PCR NEGATIVE NEGATIVE Final    Comment: (NOTE) Fact Sheet for Patients: bloggercourse.com  Fact Sheet for Healthcare Providers: seriousbroker.it  This test is not yet approved or cleared by the [place]  FDA and has been authorized for detection and/or diagnosis of SARS-CoV-2 by FDA under an Emergency Use Authorization (EUA). This EUA will remain in effect (meaning this test can be used) for the duration of the COVID-19 declaration under Section 564(b)(1) of the Act, 21 U.S.C. section 360bbb-3(b)(1), unless the authorization is terminated or revoked.  Performed at [facility], [address] [place], [place] [address]  Scheduled Meds:  acetaminophen   650 mg Oral TID   Or   acetaminophen   650 mg Rectal TID   cholecalciferol   5,000 Units Oral Daily   enoxaparin  (LOVENOX ) injection  40 mg Subcutaneous Q24H   famotidine   20 mg Oral QHS   levothyroxine   50 mcg Oral QAC breakfast   multivitamin with minerals  1 tablet Oral q AM   oseltamivir   30 mg Oral BID   sertraline   25 mg Oral Daily   simvastatin   20 mg Oral q1800   Continuous Infusions:   LOS: 4 days   Pamela Carwin, MD How to contact the [facility] Attending or Consulting provider 7A - 7P or covering provider during  after hours 7P -7A, for this patient?   07/16/2023, 2:31 PM

## 2023-07-16 NOTE — Plan of Care (Signed)

## 2023-07-16 NOTE — Progress Notes (Signed)
 Physical Therapy Treatment Patient Details Name: Pamela Roman MRN: [id number] DOB: [birth year] Today's Date: 07/16/2023   History of Present Illness 68 year old female with longstanding MS followed by Dr. Skeet with [facility] who is currently living with her sister with a history of bouts of MS flare presented to ED today with concern that she was having an MS flare.  She was brought to ED by [facility] because she has been having intermittent confusion and weakness in both legs for a few days.  She has had some fever at home and chest congestion with a temperature of 102 at home.  She says that she was told not to get vaccinated for influenza due to her MS.  She tested positive for influenza A.  She was sent for MRI of brain and spine but no evidence of MS flare on imaging. She is being admitted for supportive care due to her high risk of complication given immunocompromised state.    PT Comments  Pt tolerated today's treatment session, well with improvements made in functional transfers and tolerated 5ft of ambulation from recliner. Today's session addressed functional transfer training and BLE strengthening. Pt noted with min assist for sit/stand from recliner to date, tolerate mini squats and 15ft of ambulation with stepto pattern. Continues to show muscle weakness, most noticed with ambulation with R knee buckling during ambulation and poor swing phase and mild foot drag. Pt fatigues easily but overall making great progress. Still needs assist for powering up to stand. Pt would continue to benefit from skilled acute physical therapy services in order to progress toward POC goals, safety/independence with functional mobility and QOL.     If plan is discharge home, recommend the following: A lot of help with walking and/or transfers;Two people to help with bathing/dressing/bathroom   Can travel by private vehicle     No  Equipment Recommendations  None recommended by PT    Recommendations for  Other Services       Precautions / Restrictions Restrictions Weight Bearing Restrictions Per Provider Order: No     Mobility  Bed Mobility               General bed mobility comments: Received in recliner upon therapist entry. Patient Response: Cooperative  Transfers Overall transfer level: Needs assistance Equipment used: Rolling walker (2 wheels) Transfers: Sit to/from Stand Sit to Stand: Min assist           General transfer comment: sit/stand transfer training from recliner with RW. Cues given for RW and BUE management. min assist provided this session to power to stand and consistent verbal cues hip posterior hinge.    Ambulation/Gait Ambulation/Gait assistance: Mod assist Gait Distance (Feet): 2 Feet Assistive device: Rolling walker (2 wheels) Gait Pattern/deviations: Step-to pattern, Decreased step length - right, Decreased step length - left, Decreased stride length, Decreased dorsiflexion - left, Decreased weight shift to right, Decreased weight shift to left, Knee flexed in stance - right, Knee flexed in stance - left       General Gait Details: Tolerated walking 70ft for two trials from recliner with RW at [facility] assist for RW management, managing R knee buckling during stance phase and cues for RW management. Stepto pattern this session leading with RLE. Consistent verbal cues given for stepto pattern and failed with consistent RW first movement patterns. Fatiged after 2nd bout.   Stairs             Wheelchair Mobility     Tilt Bed  Tilt Bed Patient Response: Cooperative  Modified Rankin (Stroke Patients Only)       Balance                                            Cognition Arousal: Alert Behavior During Therapy: WFL for tasks assessed/performed Overall Cognitive Status: Within Functional Limits for tasks assessed                                          Exercises General Exercises - Lower  Extremity Long Arc Quad: AROM, 10 reps, Both, Seated, 20 reps Toe Raises: AROM, Both, 20 reps, Seated, 10 reps Mini-Sqauts: Strengthening, 5 reps, Both, Standing (while in RW and bilateral knee block by therapist.)    General Comments        Pertinent Vitals/Pain Pain Assessment Pain Assessment: No/denies pain    Home Living                          Prior Function            PT Goals (current goals can now be found in the care plan section) Acute Rehab PT Goals Patient Stated Goal: rehab then return home PT Goal Formulation: With patient Time For Goal Achievement: 07/28/23 Potential to Achieve Goals: Good    Frequency    Min 3X/week      PT Plan      Co-evaluation              AM-PAC PT 6 Clicks Mobility   Outcome Measure  Help needed turning from your back to your side while in a flat bed without using bedrails?: A Lot Help needed moving from lying on your back to sitting on the side of a flat bed without using bedrails?: A Lot Help needed moving to and from a bed to a chair (including a wheelchair)?: A Lot Help needed standing up from a chair using your arms (e.g., wheelchair or bedside chair)?: A Little Help needed to walk in hospital room?: A Lot Help needed climbing 3-5 steps with a railing? : Total 6 Click Score: 12    End of Session Equipment Utilized During Treatment: Gait belt Activity Tolerance: Patient tolerated treatment well Patient left: in chair;with call bell/phone within reach Nurse Communication: Mobility status PT Visit Diagnosis: Unsteadiness on feet (R26.81);Muscle weakness (generalized) (M62.81)     Time: 8886-8861 PT Time Calculation (min) (ACUTE ONLY): 25 min  Charges:    $Therapeutic Exercise: 8-22 mins $Therapeutic Activity: 8-22 mins PT General Charges $$ ACUTE PT VISIT: 1 Visit                     Omega JONETTA Donna ALMETA, DPT [facility] [phone]  office   Omega JONETTA Donna 07/16/2023, 11:44 AM

## 2023-07-16 NOTE — Telephone Encounter (Signed)
 Called patient and informed her that Dr. Skeet has been out of the office this week. But per Dr. Leigh Dr. Skeet prefers in person visit, but if she needs VV, just let her know the evaluation is limited as he cannot do as good an exam.   Patient stated that she is being transferred to a rehab facility and there is no way she can come in for a visit. I informed her that I will let Dr. Skeet know and we will contact her if a VV is not possible and if we need to move her appointment.

## 2023-07-17 DIAGNOSIS — J101 Influenza due to other identified influenza virus with other respiratory manifestations: Secondary | ICD-10-CM | POA: Diagnosis not present

## 2023-07-17 NOTE — Plan of Care (Signed)
  Problem: Pain Management: Goal: General experience of comfort will improve Outcome: Progressing   Problem: Safety: Goal: Ability to remain free from injury will improve Outcome: Progressing   Problem: Skin Integrity: Goal: Risk for impaired skin integrity will decrease Outcome: Progressing

## 2023-07-17 NOTE — Progress Notes (Signed)
 PROGRESS NOTE   Pamela Roman  FMW:[id number] DOB: 12/02/1955 DOA: 07/12/2023 PCP: Tobie Suzzane POUR, MD   CC--cough and weakness  Level of care: Med-Surg  Brief Admission History:  68 year old female with longstanding MS followed by Dr. Skeet with [facility] who is currently living with her sister with a history of bouts of MS flare presented to ED today with concern that she was having an MS flare.  She was brought to ED by [facility] because she has been having intermittent confusion and weakness in both legs for a few days.  She has had some fever at home and chest congestion with a temperature of 102 at home.  She says that she was told not to get vaccinated for influenza due to her MS.  She tested positive for influenza A.  She was sent for MRI of brain and spine but no evidence of MS flare on imaging. She is being admitted for supportive care due to her high risk of complication given immunocompromised state.    - -Disposition--Remains medically stable to transfer to SNF rehab, awaiting insurance authorization for discharge to SNF rehab----apparently awaiting influenza isolation period to run out prior to being accepted by SNF facility  Assessment and Plan:  1)Influenza A infection -Chest x-ray without Pneumonia - continue supportive measures  -Completed Tamiflu  -No need for further IV fluids -No hypoxia -Respiratory (flu) symptoms have improved significantly   2)Generalized weakness/ambulatory dysfunction/multiple falls - secondary to influenza infection  - Pan MRI testing stable without evidence of MS flare - continue supportive measures as ordered  -Physical therapist recommends SNF rehab  -Continue fall precautions -Outpatient follow-up with primary Neurologist Dr. Skeet   3) acute metabolic encephalopathy--resolved - Pt seems to be back to baseline now - confusion likely was associated with fever and influenza infection   4)Hypothyroidism  - resume home  levothyroxine  supplementation    DVT prophylaxis: enoxaparin  Code Status: Full  Family Communication: None at bedside Disposition: anticipate SNF rehab   Consultants:  PT/OT  Antivirals/Antimicrobials:  Tamiflu  1/6>>   Subjective:  -Sitting up in a chair , patient states that weakness is improving very slowly --No fever  Or chills  -Oral intake is fair - -Disposition--Remains medically stable to transfer to SNF rehab, awaiting insurance authorization for discharge to SNF rehab----apparently awaiting influenza isolation period to run out prior to being accepted by SNF facility  Objective: Vitals:   07/16/23 1447 07/16/23 2038 07/17/23 0055 07/17/23 0600  BP: 107/71 95/64 107/66 110/69  Pulse: 77 75 67 63  Resp: 16 19 16    Temp: 97.6 F (36.4 C) 97.9 F (36.6 C) (!) 97.5 F (36.4 C) 97.8 F (36.6 C)  TempSrc: Oral Oral Oral Oral  SpO2: 97% 98% 98% 97%  Weight:      Height:        Intake/Output Summary (Last 24 hours) at 07/17/2023 1006 Last data filed at 07/17/2023 0900 Gross per 24 hour  Intake 480 ml  Output 100 ml  Net 380 ml   Filed Weights   07/12/23 0800 07/12/23 0809 07/12/23 1734  Weight: 59.4 kg 59.9 kg 56.6 kg   Physical Exam Gen:- Awake Alert, in no acute distress  HEENT:- [place].AT, No sclera icterus Neck-Supple Neck,No JVD,.  Lungs-  CTAB , fair air movement bilaterally  CV- S1, S2 normal, RRR Abd-  +ve B.Sounds, Abd Soft, No tenderness,    Extremity/Skin:- No  edema,   good pedal pulses  Psych-affect is appropriate, oriented x3 Neuro-Generalized weakness  and deconditioning , chronic neuromuscular deficits due to underlying MS with motor strength 3/5 at baseline, no additional new focal deficits, no tremors  Data Reviewed: I have personally reviewed following labs and imaging studies  CBC: Recent Labs  Lab 07/12/23 0858  WBC 7.2  NEUTROABS 6.1  HGB 14.2  HCT 43.7  MCV 92.6  PLT 173   Basic Metabolic Panel: Recent Labs  Lab  07/12/23 0858 07/13/23 0404  NA 135 134*  K 3.5 3.3*  CL 106 105  CO2 21* 23  GLUCOSE 113* 77  BUN 22 26*  CREATININE 1.03* 0.76  CALCIUM 8.6* 8.0*  MG  --  2.1   Recent Results (from the past 240 hours)  Resp panel by RT-PCR (RSV, Flu A&B, Covid) Anterior Nasal Swab     Status: Abnormal   Collection Time: 07/12/23  8:14 AM   Specimen: Anterior Nasal Swab  Result Value Ref Range Status   SARS Coronavirus 2 by RT PCR NEGATIVE NEGATIVE Final    Comment: (NOTE) SARS-CoV-2 target nucleic acids are NOT DETECTED.  The SARS-CoV-2 RNA is generally detectable in upper respiratory specimens during the acute phase of infection. The lowest concentration of SARS-CoV-2 viral copies this assay can detect is 138 copies/mL. A negative result does not preclude SARS-Cov-2 infection and should not be used as the sole basis for treatment or other patient management decisions. A negative result may occur with  improper specimen collection/handling, submission of specimen other than nasopharyngeal swab, presence of viral mutation(s) within the areas targeted by this assay, and inadequate number of viral copies(<138 copies/mL). A negative result must be combined with clinical observations, patient history, and epidemiological information. The expected result is Negative.  Fact Sheet for Patients:  bloggercourse.com  Fact Sheet for Healthcare Providers:  seriousbroker.it  This test is no t yet approved or cleared by the [place]  FDA and  has been authorized for detection and/or diagnosis of SARS-CoV-2 by FDA under an Emergency Use Authorization (EUA). This EUA will remain  in effect (meaning this test can be used) for the duration of the COVID-19 declaration under Section 564(b)(1) of the Act, 21 U.S.C.section 360bbb-3(b)(1), unless the authorization is terminated  or revoked sooner.       Influenza A by PCR POSITIVE (A) NEGATIVE Final    Influenza B by PCR NEGATIVE NEGATIVE Final    Comment: (NOTE) The Xpert Xpress SARS-CoV-2/FLU/RSV plus assay is intended as an aid in the diagnosis of influenza from Nasopharyngeal swab specimens and should not be used as a sole basis for treatment. Nasal washings and aspirates are unacceptable for Xpert Xpress SARS-CoV-2/FLU/RSV testing.  Fact Sheet for Patients: bloggercourse.com  Fact Sheet for Healthcare Providers: seriousbroker.it  This test is not yet approved or cleared by the [place]  FDA and has been authorized for detection and/or diagnosis of SARS-CoV-2 by FDA under an Emergency Use Authorization (EUA). This EUA will remain in effect (meaning this test can be used) for the duration of the COVID-19 declaration under Section 564(b)(1) of the Act, 21 U.S.C. section 360bbb-3(b)(1), unless the authorization is terminated or revoked.     Resp Syncytial Virus by PCR NEGATIVE NEGATIVE Final    Comment: (NOTE) Fact Sheet for Patients: bloggercourse.com  Fact Sheet for Healthcare Providers: seriousbroker.it  This test is not yet approved or cleared by the [place]  FDA and has been authorized for detection and/or diagnosis of SARS-CoV-2 by FDA under an Emergency Use Authorization (EUA). This EUA will remain in effect (meaning this  test can be used) for the duration of the COVID-19 declaration under Section 564(b)(1) of the Act, 21 U.S.C. section 360bbb-3(b)(1), unless the authorization is terminated or revoked.  Performed at [facility], [address] [place], [place] [address]    Scheduled Meds:  acetaminophen   650 mg Oral TID   Or   acetaminophen   650 mg Rectal TID   cholecalciferol   5,000 Units Oral Daily   enoxaparin  (LOVENOX ) injection  40 mg Subcutaneous Q24H   famotidine   20 mg Oral QHS   levothyroxine   50 mcg Oral QAC breakfast   multivitamin with  minerals  1 tablet Oral q AM   sertraline   25 mg Oral Daily   simvastatin   20 mg Oral q1800   Continuous Infusions:   LOS: 5 days   Rendall Carwin, MD How to contact the [facility] Attending or Consulting provider 7A - 7P or covering provider during after hours 7P -7A, for this patient?   07/17/2023, 10:06 AM

## 2023-07-18 DIAGNOSIS — J101 Influenza due to other identified influenza virus with other respiratory manifestations: Secondary | ICD-10-CM | POA: Diagnosis not present

## 2023-07-18 LAB — BASIC METABOLIC PANEL
Anion gap: 8 (ref 5–15)
BUN: 23 mg/dL (ref 8–23)
CO2: 22 mmol/L (ref 22–32)
Calcium: 8.4 mg/dL — ABNORMAL LOW (ref 8.9–10.3)
Chloride: 109 mmol/L (ref 98–111)
Creatinine, Ser: 0.6 mg/dL (ref 0.44–1.00)
GFR, Estimated: >60 mL/min (ref >60)
Glucose, Bld: 90 mg/dL (ref 70–99)
Potassium: 3.6 mmol/L (ref 3.5–5.1)
Sodium: 139 mmol/L (ref 135–145)

## 2023-07-18 NOTE — Progress Notes (Signed)
 Patient no longer has an iv. MD Emokpae aware and states patient does not need another.

## 2023-07-18 NOTE — Progress Notes (Signed)
 Acknowleged evaluation order.  Pt evaluated on 07/16/23 by therapy.  Virgina Organ, PT CLT [phone]

## 2023-07-18 NOTE — Progress Notes (Signed)
 PROGRESS NOTE  Pamela Roman  FMW:[id number] DOB: 1956/06/01 DOA: 07/12/2023 PCP: Tobie Suzzane POUR, MD   CC--cough and weakness  Level of care: Med-Surg  Brief Admission History:  68 year old female with longstanding MS followed by Dr. Skeet with [facility] who is currently living with her sister with a history of bouts of MS flare presented to ED today with concern that she was having an MS flare.  She was brought to ED by [facility] because she has been having intermittent confusion and weakness in both legs for a few days.  She has had some fever at home and chest congestion with a temperature of 102 at home.  She says that she was told not to get vaccinated for influenza due to her MS.  She tested positive for influenza A.  She was sent for MRI of brain and spine but no evidence of MS flare on imaging. She is being admitted for supportive care due to her high risk of complication given immunocompromised state.    - -Disposition--Remains medically stable to transfer to SNF rehab, awaiting insurance authorization for discharge to SNF rehab----apparently awaiting influenza isolation period to run out prior to being accepted by SNF facility  Assessment and Plan:  1)Influenza A infection -Chest x-ray without Pneumonia - continue supportive measures  -Completed Tamiflu  -No need for further IV fluids -No hypoxia -Respiratory (flu) symptoms have mostly resolved   2)Generalized weakness/ambulatory dysfunction/multiple falls - secondary to influenza infection  - Pan MRI testing stable without evidence of MS flare - continue supportive measures as ordered   -Continue fall precautions -Outpatient follow-up with primary Neurologist Dr. Skeet 07/18/23 -- Previously evaluated by physical therapist , recommended SNF rehab -Patient is feeling stronger overall---we will get physical therapy to reevaluate to see if patient can go home with home health rather than going to SNF   3) acute  metabolic encephalopathy--resolved - Pt seems to be back to baseline now - confusion likely was associated with fever and influenza infection   4)Hypothyroidism  - resume home levothyroxine  supplementation    DVT prophylaxis: enoxaparin  Code Status: Full  Family Communication: None at bedside Disposition: anticipate SNF rehab   Consultants:  PT/OT  Antivirals/Antimicrobials:  Tamiflu  1/6>>   Subjective:  07/18/23 -- Previously evaluated by physical therapist , recommended SNF rehab -Patient is feeling stronger overall---we will get physical therapy to reevaluate to see if patient can go home with home health rather than going to SNF - -Disposition--Remains medically stable to transfer to SNF rehab, awaiting insurance authorization for discharge to SNF rehab----apparently awaiting influenza isolation period to run out prior to being accepted by SNF facility  Objective: Vitals:   07/17/23 1417 07/17/23 2014 07/18/23 0405 07/18/23 1230  BP: 113/62 116/69 122/72 123/64  Pulse: 73 66 74 65  Resp: 16 18 18 18   Temp: 99 F (37.2 C) 97.7 F (36.5 C) 97.7 F (36.5 C) 98.5 F (36.9 C)  TempSrc:  Oral Oral   SpO2: 98% 97% 98% 96%  Weight:      Height:        Intake/Output Summary (Last 24 hours) at 07/18/2023 1429 Last data filed at 07/17/2023 1700 Gross per 24 hour  Intake 240 ml  Output --  Net 240 ml   Filed Weights   07/12/23 0800 07/12/23 0809 07/12/23 1734  Weight: 59.4 kg 59.9 kg 56.6 kg   Physical Exam Gen:- Awake Alert, in no acute distress  HEENT:- [place].AT, No sclera icterus Neck-Supple Neck,No JVD,.  Lungs-  CTAB , fair air movement bilaterally  CV- S1, S2 normal, RRR Abd-  +ve B.Sounds, Abd Soft, No tenderness,    Extremity/Skin:- No  edema,   good pedal pulses  Psych-affect is appropriate, oriented x3 Neuro-Generalized weakness and deconditioning , chronic neuromuscular deficits due to underlying MS with motor strength 3/5 at baseline, no additional new  focal deficits, no tremors  Data Reviewed: I have personally reviewed following labs and imaging studies  CBC: Recent Labs  Lab 07/12/23 0858  WBC 7.2  NEUTROABS 6.1  HGB 14.2  HCT 43.7  MCV 92.6  PLT 173   Basic Metabolic Panel: Recent Labs  Lab 07/12/23 0858 07/13/23 0404 07/18/23 0429  NA 135 134* 139  K 3.5 3.3* 3.6  CL 106 105 109  CO2 21* 23 22  GLUCOSE 113* 77 90  BUN 22 26* 23  CREATININE 1.03* 0.76 0.60  CALCIUM 8.6* 8.0* 8.4*  MG  --  2.1  --    Recent Results (from the past 240 hours)  Resp panel by RT-PCR (RSV, Flu A&B, Covid) Anterior Nasal Swab     Status: Abnormal   Collection Time: 07/12/23  8:14 AM   Specimen: Anterior Nasal Swab  Result Value Ref Range Status   SARS Coronavirus 2 by RT PCR NEGATIVE NEGATIVE Final    Comment: (NOTE) SARS-CoV-2 target nucleic acids are NOT DETECTED.  The SARS-CoV-2 RNA is generally detectable in upper respiratory specimens during the acute phase of infection. The lowest concentration of SARS-CoV-2 viral copies this assay can detect is 138 copies/mL. A negative result does not preclude SARS-Cov-2 infection and should not be used as the sole basis for treatment or other patient management decisions. A negative result may occur with  improper specimen collection/handling, submission of specimen other than nasopharyngeal swab, presence of viral mutation(s) within the areas targeted by this assay, and inadequate number of viral copies(<138 copies/mL). A negative result must be combined with clinical observations, patient history, and epidemiological information. The expected result is Negative.  Fact Sheet for Patients:  bloggercourse.com  Fact Sheet for Healthcare Providers:  seriousbroker.it  This test is no t yet approved or cleared by the [place]  FDA and  has been authorized for detection and/or diagnosis of SARS-CoV-2 by FDA under an Emergency Use  Authorization (EUA). This EUA will remain  in effect (meaning this test can be used) for the duration of the COVID-19 declaration under Section 564(b)(1) of the Act, 21 U.S.C.section 360bbb-3(b)(1), unless the authorization is terminated  or revoked sooner.       Influenza A by PCR POSITIVE (A) NEGATIVE Final   Influenza B by PCR NEGATIVE NEGATIVE Final    Comment: (NOTE) The Xpert Xpress SARS-CoV-2/FLU/RSV plus assay is intended as an aid in the diagnosis of influenza from Nasopharyngeal swab specimens and should not be used as a sole basis for treatment. Nasal washings and aspirates are unacceptable for Xpert Xpress SARS-CoV-2/FLU/RSV testing.  Fact Sheet for Patients: bloggercourse.com  Fact Sheet for Healthcare Providers: seriousbroker.it  This test is not yet approved or cleared by the [place]  FDA and has been authorized for detection and/or diagnosis of SARS-CoV-2 by FDA under an Emergency Use Authorization (EUA). This EUA will remain in effect (meaning this test can be used) for the duration of the COVID-19 declaration under Section 564(b)(1) of the Act, 21 U.S.C. section 360bbb-3(b)(1), unless the authorization is terminated or revoked.     Resp Syncytial Virus by PCR NEGATIVE NEGATIVE Final  Comment: (NOTE) Fact Sheet for Patients: bloggercourse.com  Fact Sheet for Healthcare Providers: seriousbroker.it  This test is not yet approved or cleared by the [place]  FDA and has been authorized for detection and/or diagnosis of SARS-CoV-2 by FDA under an Emergency Use Authorization (EUA). This EUA will remain in effect (meaning this test can be used) for the duration of the COVID-19 declaration under Section 564(b)(1) of the Act, 21 U.S.C. section 360bbb-3(b)(1), unless the authorization is terminated or revoked.  Performed at [facility], [address] [place], [place] [address]    Scheduled Meds:  acetaminophen   650 mg Oral TID   Or   acetaminophen   650 mg Rectal TID   cholecalciferol   5,000 Units Oral Daily   enoxaparin  (LOVENOX ) injection  40 mg Subcutaneous Q24H   famotidine   20 mg Oral QHS   levothyroxine   50 mcg Oral QAC breakfast   multivitamin with minerals  1 tablet Oral q AM   sertraline   25 mg Oral Daily   simvastatin   20 mg Oral q1800   Continuous Infusions:   LOS: 6 days   Rendall Carwin, MD How to contact the [facility] Attending or Consulting provider 7A - 7P or covering provider during after hours 7P -7A, for this patient?   07/18/2023, 2:29 PM

## 2023-07-19 ENCOUNTER — Telehealth: Payer: Self-pay

## 2023-07-19 DIAGNOSIS — J101 Influenza due to other identified influenza virus with other respiratory manifestations: Secondary | ICD-10-CM | POA: Diagnosis not present

## 2023-07-19 LAB — CREATININE, SERUM
Creatinine, Ser: 0.56 mg/dL (ref 0.44–1.00)
GFR, Estimated: >60 mL/min (ref >60)

## 2023-07-19 MED ORDER — DEXTROMETHORPHAN POLISTIREX ER 30 MG/5ML PO SUER: 30.0000 mg | Freq: Two times a day (BID) | ORAL | 0 refills | Status: DC | PRN

## 2023-07-19 MED ORDER — ALBUTEROL SULFATE (2.5 MG/3ML) 0.083% IN NEBU: 2.5000 mg | INHALATION_SOLUTION | RESPIRATORY_TRACT | 12 refills | Status: DC | PRN

## 2023-07-19 MED ORDER — PANTOPRAZOLE SODIUM 40 MG PO TBEC: 40.0000 mg | DELAYED_RELEASE_TABLET | Freq: Every day | ORAL | 1 refills | Status: DC

## 2023-07-19 MED ORDER — ACETAMINOPHEN 325 MG PO TABS: 650.0000 mg | ORAL_TABLET | Freq: Four times a day (QID) | ORAL | Status: AC | PRN

## 2023-07-19 NOTE — TOC Transition Note (Signed)
 Transition of Care [facility]) - Discharge Note   Patient Details  Name: Pamela Roman MRN: [id number] Date of Birth: 23-Apr-1956  [facility]) CM/SW Contact:  Rollo Petri, LCSW Phone Number: 07/19/2023, 12:11 PM   Clinical Narrative:     Pt medically stable for dc per MD. Pt remains in agreement for dc to SNF for short term rehab.  Debbie at [facility] states they can admit pt after 3pm today.   DC clinical sent electronically. RN to call report. [facility] transport arrange.  No other TOC needs for dc.  Final next level of care: Skilled Nursing Facility Barriers to Discharge: Barriers Resolved   Patient Goals and CMS Choice Patient states their goals for this hospitalization and ongoing recovery are:: short term SNF CMS Medicare.gov Compare Post Acute Care list provided to:: Patient Choice offered to / list presented to : Patient [facility] ownership interest in [facility].provided to:: Patient    Discharge Placement              Patient chooses bed at: Other - please specify in the comment section below: [facility]) Patient to be transferred to facility by: [facility] Name of family member notified: pt only Patient and family notified of of transfer: 07/19/23  Discharge Plan and Services Additional resources added to the After Visit Summary for   In-house Referral: Clinical Social Work   Post Acute Care Choice: Skilled Nursing Facility                               Social Drivers of Health (SDOH) Interventions SDOH Screenings   Food Insecurity: No Food Insecurity (07/12/2023)  Housing: High Risk (07/12/2023)  Transportation Needs: No Transportation Needs (07/12/2023)  Utilities: Not At Risk (07/12/2023)  Alcohol Screen: Low Risk  (12/09/2022)  Depression (PHQ2-9): Low Risk  (12/09/2022)  Financial Resource Strain: Low Risk  (12/09/2022)  Physical Activity: Insufficiently Active (12/09/2022)  Social Connections: Moderately Isolated (07/12/2023)   Stress: No Stress Concern Present (12/09/2022)  Tobacco Use: Low Risk  (07/12/2023)     Readmission Risk Interventions     No data to display

## 2023-07-19 NOTE — Progress Notes (Signed)
 Second attempt to reach [place] and gave report to [place].

## 2023-07-19 NOTE — Care Management Important Message (Addendum)
 Important Message  Patient Details  Name: Pamela Roman MRN: [id number] Date of Birth: 10-Jul-1955   Important Message Given:  Other (see comment) (unable to speak with patient, reviewed letter on 1/10, no additonal copy needed) Spoke with [name] telephonically at [phone] to review letter.  No additonal copy needed.    [name] L Cj Beecher 07/19/2023, 12:02 PM

## 2023-07-19 NOTE — Discharge Summary (Signed)
 Pamela Roman, is a 68 y.o. female  DOB 13-Sep-1955  MRN [id number].  Admission date:  07/12/2023  Admitting Physician  Afton LITTIE Louder, MD  Discharge Date:  07/19/2023   Primary MD  Tobie Suzzane POUR, MD  Recommendations for primary care physician for things to follow:  1)Follow-up as outpatient with the primary neurologist Dr. Skeet  Admission Diagnosis  Influenza A [J10.1] Influenza [J11.1]   Discharge Diagnosis  Influenza A [J10.1] Influenza [J11.1]    Principal Problem:   Influenza A Active Problems:   Hypothyroidism   Multiple sclerosis (HCC)   HLD (hyperlipidemia)   Osteoporosis   Multiple falls   Physical deconditioning   Bilateral leg weakness   Generalized weakness   Altered mental status   Fever, unspecified      Past Medical History:  Diagnosis Date   Depression    Phreesia 08/06/2020   Hx of migraines    Hyperglycemia    Hypothyroidism    Migraines    Multiple sclerosis (HCC)     Past Surgical History:  Procedure Laterality Date   CATARACT EXTRACTION W/PHACO Left 01/01/2020   Procedure: CATARACT EXTRACTION PHACO AND INTRAOCULAR LENS PLACEMENT LEFT EYE;  Surgeon: Harrie Agent, MD;  Location: [facility];  Service: Ophthalmology;  Laterality: Left;  CDE: 3.37   CATARACT EXTRACTION W/PHACO Right 01/19/2020   Procedure: CATARACT EXTRACTION PHACO AND INTRAOCULAR LENS PLACEMENT RIGHT EYE;  Surgeon: Harrie Agent, MD;  Location: [facility];  Service: Ophthalmology;  Laterality: Right;  CDE: 3.16   TONSILLECTOMY       HPI  from the history and physical done on the day of admission:      Chief Complaint: Weakness and fever    HPI: Pamela Roman is a 68 year old female with longstanding MS followed by Dr. Skeet with [facility] who is currently living with her sister with a history of bouts of MS flare presented to ED today with concern that she was having an MS flare.  She was  brought to ED by [facility] because she has been having intermittent confusion and weakness in both legs for a few days.  She has had some fever at home and chest congestion with a temperature of 102 at home.  She says that she was told not to get vaccinated for influenza due to her MS.  She tested positive for influenza A.  She was sent for MRI of brain and spine but no evidence of MS flare on imaging. She is being admitted for supportive care due to her high risk of complication given immunocompromised state.    Hospital Course:    1)Influenza A infection -Chest x-ray without Pneumonia - continue supportive measures  -Completed Tamiflu  -Responded well to IV fluids -No hypoxia -Respiratory (flu) symptoms have mostly resolved   2)Generalized weakness/ambulatory dysfunction/multiple falls - secondary to influenza infection  - Pan MRI testing stable without evidence of MS flare - continue supportive measures as ordered   -Continue fall precautions -Outpatient follow-up with primary Neurologist Dr. Skeet -Evaluated by  physical therapist , recommended SNF rehab   3) acute metabolic encephalopathy--resolved - -- confusion was likely was associated with fever and influenza infection -Mentation is back to baseline at this time    4)Hypothyroidism  - resume home levothyroxine  supplementation   Discharge Condition: stable  Follow UP   Contact information for follow-up providers     Skeet Juliene SAUNDERS, DO. Schedule an appointment as soon as possible for a visit in 2 week(s).   Specialty: Neurology Contact information: [address]  [address] [place] [address] [phone]              Contact information for after-discharge care     Destination     [facility] .   Service: Skilled Nursing Contact information: [address] [place]  [address] [phone]                     Diet and Activity  recommendation:  As advised  Discharge Instructions    Discharge Instructions     Diet general   Complete by: As directed    Discharge instructions   Complete by: As directed    1)Follow-up as outpatient with the primary neurologist Dr. Skeet   Increase activity slowly   Complete by: As directed        Discharge Medications     Allergies as of 07/19/2023       Reactions   Codeine Nausea And Vomiting        Medication List     TAKE these medications    acetaminophen  325 MG tablet Commonly known as: TYLENOL  Take 2 tablets (650 mg total) by mouth every 6 (six) hours as needed for mild pain (pain score 1-3), fever or headache.   albuterol  (2.5 MG/3ML) 0.083% nebulizer solution Commonly known as: PROVENTIL  Take 3 mLs (2.5 mg total) by nebulization every 4 (four) hours as needed for wheezing or shortness of breath.   alendronate  70 MG tablet Commonly known as: FOSAMAX  TAKE 1 TABLET ONCE A WEEK WITH A FULL GLASS OF WATER ON AN EMPTY STOMACH What changed: See the new instructions.   BETA CAROTENE PO Take 1 capsule by mouth in the morning. Vitamin A, unknown strength.   dextromethorphan  30 MG/5ML liquid Commonly known as: DELSYM  Take 5 mLs (30 mg total) by mouth 2 (two) times daily as needed for cough.   diazepam  5 MG tablet Commonly known as: VALIUM  TAKE 1 TABLET BY MOUTH 30 TO 40 MINUTES BEFORE MRI   levothyroxine  50 MCG tablet Commonly known as: SYNTHROID  TAKE 1 TABLET DAILY BEFORE BREAKFAST   Mavenclad  (10 Tabs) 10 MG Tbpk Generic drug: Cladribine  (10 Tabs)   One-A-Day Womens 50 Plus Tabs Take 1 tablet by mouth in the morning.   pantoprazole  40 MG tablet Commonly known as: Protonix  Take 1 tablet (40 mg total) by mouth daily.   sertraline  25 MG tablet Commonly known as: ZOLOFT  Take 1 tablet (25 mg total) by mouth daily.   simvastatin  20 MG tablet Commonly known as: ZOCOR  TAKE 1 TABLET DAILY   SUMAtriptan  100 MG tablet Commonly known as:  IMITREX  TAKE 1 TABLET EVERY 2 HOURS AS NEEDED FOR MIGRAINE. MAY REPEAT IN 2 HOURS IF HEADACHE PERSISTS OR RECURS What changed: See the new instructions.   Vitamin D3 125 MCG (5000 UT) Caps Take 1 capsule (5,000 Units total) by mouth daily.       Major procedures and Radiology Reports - PLEASE review  detailed and final reports for all details, in brief -   MR Lumbar Spine W Wo Contrast Result Date: 07/12/2023 CLINICAL DATA:  History of multiple sclerosis. Follow-up for change or progressive disease. EXAM: MRI LUMBAR SPINE WITHOUT AND WITH CONTRAST TECHNIQUE: Multiplanar and multiecho pulse sequences of the lumbar spine were obtained without and with intravenous contrast. CONTRAST:  6mL GADAVIST  GADOBUTROL  1 MMOL/ML IV SOLN COMPARISON:  CT 06/14/2022.  MRI 11/15/2020. FINDINGS: Segmentation: 5 lumbar type vertebral bodies as numbered previously. Alignment: Mild scoliotic curvature convex to the left. 2 mm retrolisthesis at L2-3. Vertebrae: Old healed augmented fracture at L1. No progression or ongoing edema. Old healed inferior endplate fracture at L2 without progression. No new regional fracture. Conus medullaris and cauda equina: Distal cord and conus appear normal. Conus tip at T12-L1. Paraspinal and other soft tissues: Negative Disc levels: L1-2: Disc bulge.  No stenosis. L2-3: 2 mm retrolisthesis. Mild posterior bowing of the posteroinferior margin of the vertebral body related to the old fracture. Mild narrowing of the lateral recesses and foramina but no definite neural compression. L3-4: Disc bulge. Mild facet hypertrophy. Mild lateral recess narrowing but no neural compression. L4-5: Disc bulge. Mild facet hypertrophy. Mild narrowing of the lateral recesses but no visible neural compression. L5-S1: Minimal disc bulge.  No stenosis. IMPRESSION: 1. Old healed augmented fracture at L1. Old healed inferior endplate fracture at L2. No new fracture, progression or ongoing edema. 2. Mild degenerative  changes elsewhere in the lumbar spine as outlined above. Mild narrowing of the lateral recesses and foramina at L2-3 and L3-4 but no visible neural compression. Electronically Signed   By: Oneil Officer M.D.   On: 07/12/2023 11:05   MR THORACIC SPINE W WO CONTRAST Result Date: 07/12/2023 CLINICAL DATA:  History of multiple sclerosis.  Follow-up. EXAM: MRI THORACIC WITHOUT AND WITH CONTRAST TECHNIQUE: Multiplanar and multiecho pulse sequences of the thoracic spine were obtained without and with intravenous contrast. CONTRAST:  6mL GADAVIST  GADOBUTROL  1 MMOL/ML IV SOLN COMPARISON:  03/10/2023 FINDINGS: Alignment: No change in alignment. Slightly increased thoracic kyphotic curvature. Vertebrae: Chronic benign hemangioma within the T2 vertebral body as seen previously. Old healed partial compression fractures at T3, T5, T6, T10 and T11. Old fracture at T12 with previous vertebral augmentation. No evidence of recent or progressive fracture. No marrow edema or enhancement. Cord: Axial images suffer from motion degradation. Small foci of T2 signal within the cord appears similar to the prior study. No advanced involvement in the thoracic region. No evidence of progressive disease. No abnormal contrast enhancement. Paraspinal and other soft tissues: Negative Disc levels: No significant disc level finding in the thoracic region. No compressive narrowing of the canal or foramina. IMPRESSION: 1. No change since the prior study. Small foci of T2 signal within the cord consistent with the history of multiple sclerosis. No evidence of progressive disease. No abnormal contrast enhancement. 2. Old healed partial compression fractures at T3, T5, T6, T10 and T11. Old fracture at T12 with previous vertebral augmentation. No evidence of recent or progressive fracture. Electronically Signed   By: Oneil Officer M.D.   On: 07/12/2023 11:00   MR Cervical Spine W and Wo Contrast Result Date: 07/12/2023 CLINICAL DATA:  Multiple sclerosis.  Cervical spinal cord involvement. Assess for change or active disease. EXAM: MRI CERVICAL SPINE WITHOUT AND WITH CONTRAST TECHNIQUE: Multiplanar and multiecho pulse sequences of the cervical spine, to include the craniocervical junction and cervicothoracic junction, were obtained without and with intravenous contrast. CONTRAST:  6mL  GADAVIST  GADOBUTROL  1 MMOL/ML IV SOLN COMPARISON:  03/10/2023 FINDINGS: Alignment: No malalignment. Vertebrae: No acute fracture. 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 Old minor superior endplate depression at T3. Cord: No cord compression. Numerous foci of abnormal T2 signal within the cervical cord without evidence of change or progression since September. Cord volume loss at the C7-T1 level is similar. Axial images do suffer from motion degradation. No abnormal regional enhancement. Posterior Fossa, vertebral arteries, paraspinal tissues: See results of brain MRI. Soft tissues of the neck appear unremarkable. Disc levels: No significant disc level pathology. Mild non-compressive spondylosis at C5-6 and C6-7 as seen previously. IMPRESSION: 1. Numerous foci of abnormal T2 signal within the cervical cord without evidence of change or progression since September. Cord volume loss at the C7-T1 level is similar. No abnormal regional enhancement. 2. Mild non-compressive spondylosis at C5-6 and C6-7. Electronically Signed   By: Oneil Officer M.D.   On: 07/12/2023 10:56   MR Brain W and Wo Contrast Result Date: 07/12/2023 CLINICAL DATA:  Multiple sclerosis. Assess for clinical change or progression. EXAM: MRI HEAD WITHOUT AND WITH CONTRAST TECHNIQUE: Multiplanar, multiecho pulse sequences of the brain and surrounding structures were obtained without and with intravenous contrast. CONTRAST:  6mL GADAVIST  GADOBUTROL  1 MMOL/ML IV SOLN COMPARISON:  03/10/2023 FINDINGS: Brain: No visible change since the study of September. Widespread foci of demyelination seen within  the cerebellum, brainstem and cerebral hemispheric white matter. No new or progressive lesions. No lesions show restricted diffusion or contrast enhancement. No intracranial hemorrhage. No hydrocephalus or extra-axial collection. In Vascular: Major vessels at the base of the brain show flow. Skull and upper cervical spine: Negative Sinuses/Orbits: Mild mucosal thickening of the left maxillary sinus. No advanced sinus inflammation. Orbits negative. Other: None IMPRESSION: No visible change since the study of September. Widespread foci of demyelination within the cerebellum, brainstem and cerebral hemispheric white matter. No new or progressive lesions. No lesions show restricted diffusion or contrast enhancement. Electronically Signed   By: Oneil Officer M.D.   On: 07/12/2023 10:53   DG Chest 2 View Result Date: 07/12/2023 CLINICAL DATA:  Rule out pneumonia.  Multiple sclerosis flare. EXAM: CHEST - 2 VIEW COMPARISON:  07/01/2021 FINDINGS: Normal heart size. Aortic tortuosity. There is no edema, consolidation, effusion, or pneumothorax. Vertebral cement augmentation at the thoracolumbar junction. IMPRESSION: No active cardiopulmonary disease. Electronically Signed   By: Dorn Roulette M.D.   On: 07/12/2023 08:52   Micro Results  Recent Results (from the past 240 hours)  Resp panel by RT-PCR (RSV, Flu A&B, Covid) Anterior Nasal Swab     Status: Abnormal   Collection Time: 07/12/23  8:14 AM   Specimen: Anterior Nasal Swab  Result Value Ref Range Status   SARS Coronavirus 2 by RT PCR NEGATIVE NEGATIVE Final    Comment: (NOTE) SARS-CoV-2 target nucleic acids are NOT DETECTED.  The SARS-CoV-2 RNA is generally detectable in upper respiratory specimens during the acute phase of infection. The lowest concentration of SARS-CoV-2 viral copies this assay can detect is 138 copies/mL. A negative result does not preclude SARS-Cov-2 infection and should not be used as the sole basis for treatment or other patient  management decisions. A negative result may occur with  improper specimen collection/handling, submission of specimen other than nasopharyngeal swab, presence of viral mutation(s) within the areas targeted by this assay, and inadequate number of viral copies(<138 copies/mL). A negative result must be combined with clinical observations, patient history, and epidemiological information.  The expected result is Negative.  Fact Sheet for Patients:  bloggercourse.com  Fact Sheet for Healthcare Providers:  seriousbroker.it  This test is no t yet approved or cleared by the [place]  FDA and  has been authorized for detection and/or diagnosis of SARS-CoV-2 by FDA under an Emergency Use Authorization (EUA). This EUA will remain  in effect (meaning this test can be used) for the duration of the COVID-19 declaration under Section 564(b)(1) of the Act, 21 U.S.C.section 360bbb-3(b)(1), unless the authorization is terminated  or revoked sooner.       Influenza A by PCR POSITIVE (A) NEGATIVE Final   Influenza B by PCR NEGATIVE NEGATIVE Final    Comment: (NOTE) The Xpert Xpress SARS-CoV-2/FLU/RSV plus assay is intended as an aid in the diagnosis of influenza from Nasopharyngeal swab specimens and should not be used as a sole basis for treatment. Nasal washings and aspirates are unacceptable for Xpert Xpress SARS-CoV-2/FLU/RSV testing.  Fact Sheet for Patients: bloggercourse.com  Fact Sheet for Healthcare Providers: seriousbroker.it  This test is not yet approved or cleared by the [place]  FDA and has been authorized for detection and/or diagnosis of SARS-CoV-2 by FDA under an Emergency Use Authorization (EUA). This EUA will remain in effect (meaning this test can be used) for the duration of the COVID-19 declaration under Section 564(b)(1) of the Act, 21 U.S.C. section  360bbb-3(b)(1), unless the authorization is terminated or revoked.     Resp Syncytial Virus by PCR NEGATIVE NEGATIVE Final    Comment: (NOTE) Fact Sheet for Patients: bloggercourse.com  Fact Sheet for Healthcare Providers: seriousbroker.it  This test is not yet approved or cleared by the [place]  FDA and has been authorized for detection and/or diagnosis of SARS-CoV-2 by FDA under an Emergency Use Authorization (EUA). This EUA will remain in effect (meaning this test can be used) for the duration of the COVID-19 declaration under Section 564(b)(1) of the Act, 21 U.S.C. section 360bbb-3(b)(1), unless the authorization is terminated or revoked.  Performed at [facility], [address], [place], [place] [address]    Today   Subjective    Pamela Roman today has no new complaints No fever  Or chills   No Nausea, Vomiting or Diarrhea   Patient has been seen and examined prior to discharge   Objective   Blood pressure (!) 111/52, pulse 65, temperature (!) 97.5 F (36.4 C), temperature source Oral, resp. rate 16, height 5' 3 (1.6 m), weight 56.6 kg, SpO2 98%.   Intake/Output Summary (Last 24 hours) at 07/19/2023 1108 Last data filed at 07/18/2023 1814 Gross per 24 hour  Intake 480 ml  Output --  Net 480 ml   Exam Gen:- Awake Alert, no acute distress  HEENT:- [place].AT, No sclera icterus Neck-Supple Neck,No JVD,.  Lungs-  CTAB , good air movement bilaterally CV- S1, S2 normal, regular Abd-  +ve B.Sounds, Abd Soft, No tenderness,    Extremity/Skin:- No  edema,   good pulses Psych-affect is appropriate, oriented x3 Neuro-no new focal deficits, no tremors  Neuro-Generalized weakness and deconditioning , chronic neuromuscular deficits due to underlying MS with motor strength 3/5 at baseline, no additional new focal deficits, no tremors    Data Review   CBC w Diff:  Lab Results  Component Value Date   WBC 7.2  07/12/2023   HGB 14.2 07/12/2023   HGB 12.9 01/06/2023   HCT 43.7 07/12/2023   HCT 39.3 01/06/2023   PLT 173 07/12/2023   PLT 260 07/16/2022  LYMPHOPCT 4 07/12/2023   MONOPCT 11 07/12/2023   EOSPCT 0 07/12/2023   BASOPCT 0 07/12/2023   CMP:  Lab Results  Component Value Date   NA 139 07/18/2023   NA 141 07/16/2022   K 3.6 07/18/2023   CL 109 07/18/2023   CO2 22 07/18/2023   BUN 23 07/18/2023   BUN 14 07/16/2022   CREATININE 0.56 07/19/2023   PROT 7.0 07/12/2023   PROT 6.4 07/16/2022   ALBUMIN 4.0 07/12/2023   ALBUMIN 4.1 07/16/2022   BILITOT 0.8 07/12/2023   BILITOT 0.5 07/16/2022   ALKPHOS 79 07/12/2023   AST 22 07/12/2023   ALT 15 07/12/2023   Total Discharge time is about 33 minutes  Rendall Carwin M.D on 07/19/2023 at 11:08 AM  Go to [email or web address] -  for contact info  [facility] - Office  [phone]

## 2023-07-19 NOTE — Telephone Encounter (Signed)
 Called patient to see the patient still in the hospital. Patient has an appt on Thursday.   Patient to call back to advised if she will be transferred to rehab if so patient may do a VV for Thursday's visit.

## 2023-07-19 NOTE — Progress Notes (Signed)
 Physical Therapy Treatment Patient Details Name: Pamela Roman MRN: [id number] DOB: Aug 27, 1955 Today's Date: 07/19/2023   History of Present Illness 68 year old female with longstanding MS followed by Dr. Skeet with [facility] who is currently living with her sister with a history of bouts of MS flare presented to ED today with concern that she was having an MS flare.  She was brought to ED by [facility] because she has been having intermittent confusion and weakness in both legs for a few days.  She has had some fever at home and chest congestion with a temperature of 102 at home.  She says that she was told not to get vaccinated for influenza due to her MS.  She tested positive for influenza A.  She was sent for MRI of brain and spine but no evidence of MS flare on imaging. She is being admitted for supportive care due to her high risk of complication given immunocompromised state.    PT Comments  Pt presents seated in chair (assisted by nursing staff) and agreeable to therapy. Pt ambulated 20 ft with RW and verbal/tactile cueing. Pt was unsteady while ambulating with weakness in R LE. Pt ambulated with a step to pattern. Pt tolerated staying up in chair. Patient will benefit from continued skilled physical therapy in hospital and recommended venue below to increase strength, balance, endurance for safe ADLs and gait.     If plan is discharge home, recommend the following: A lot of help with walking and/or transfers;A little help with bathing/dressing/bathroom;Assistance with cooking/housework   Can travel by private vehicle     Yes  Equipment Recommendations  None recommended by PT    Recommendations for Other Services       Precautions / Restrictions Precautions Precautions: Fall Restrictions Weight Bearing Restrictions Per Provider Order: No     Mobility  Bed Mobility                 Patient Response: Cooperative  Transfers Overall transfer level: Needs  assistance Equipment used: Rolling walker (2 wheels) Transfers: Sit to/from Stand Sit to Stand: Min assist           General transfer comment: sit/stand transfer training from recliner with RW. min assist provided to power to stand    Ambulation/Gait Ambulation/Gait assistance: Min assist Gait Distance (Feet): 20 Feet Assistive device: Rolling walker (2 wheels) Gait Pattern/deviations: Decreased step length - right, Decreased step length - left, Decreased stride length, Step-to pattern Gait velocity: slow     General Gait Details: tolerated ambulating 20 ft from recliner with RW at min assist. fatigued after 20 ft   Stairs             Wheelchair Mobility     Tilt Bed Tilt Bed Patient Response: Cooperative  Modified Rankin (Stroke Patients Only)       Balance Overall balance assessment: Needs assistance Sitting-balance support: Bilateral upper extremity supported Sitting balance-Leahy Scale: Fair Sitting balance - Comments: Seated in chair Postural control: Posterior lean Standing balance support: During functional activity, Bilateral upper extremity supported Standing balance-Leahy Scale: Fair Standing balance comment: min assist with RW                            Cognition Arousal: Alert Behavior During Therapy: WFL for tasks assessed/performed Overall Cognitive Status: Within Functional Limits for tasks assessed  Exercises General Exercises - Lower Extremity Long Arc Quad: Seated, AROM, Both, 10 reps Hip Flexion/Marching: Seated, AROM, Left, AAROM, Right, 10 reps Toe Raises: Seated, AROM, Both, 10 reps Heel Raises: Seated, AROM, Both, 10 reps    General Comments        Pertinent Vitals/Pain Pain Assessment Pain Assessment: No/denies pain    Home Living                          Prior Function            PT Goals (current goals can now be found in the care  plan section) Acute Rehab PT Goals Patient Stated Goal: rehab then return home PT Goal Formulation: With patient Time For Goal Achievement: 07/28/23 Potential to Achieve Goals: Good Progress towards PT goals: Progressing toward goals    Frequency    Min 3X/week      PT Plan      Co-evaluation              AM-PAC PT 6 Clicks Mobility   Outcome Measure  Help needed turning from your back to your side while in a flat bed without using bedrails?: A Little Help needed moving from lying on your back to sitting on the side of a flat bed without using bedrails?: A Little Help needed moving to and from a bed to a chair (including a wheelchair)?: A Little Help needed standing up from a chair using your arms (e.g., wheelchair or bedside chair)?: A Little Help needed to walk in hospital room?: A Little Help needed climbing 3-5 steps with a railing? : A Lot 6 Click Score: 17    End of Session   Activity Tolerance: Patient tolerated treatment well Patient left: in chair;with call bell/phone within reach Nurse Communication: Mobility status PT Visit Diagnosis: Unsteadiness on feet (R26.81);Muscle weakness (generalized) (M62.81);Other abnormalities of gait and mobility (R26.89)     Time: 9054-8990 PT Time Calculation (min) (ACUTE ONLY): 24 min  Charges:    $Therapeutic Exercise: 8-22 mins $Therapeutic Activity: 8-22 mins PT General Charges $$ ACUTE PT VISIT: 1 Visit                     Lemmie Vanlanen SPT

## 2023-07-19 NOTE — Progress Notes (Signed)
 Dressed self to prepare for discharge to [facility].  No IV to remove at this point.  Discharge packet transported with patient by [facility] to [facility]

## 2023-07-19 NOTE — Discharge Instructions (Signed)
 1)Follow-up as outpatient with the primary neurologist Dr. Everlena Cooper

## 2023-07-21 NOTE — Progress Notes (Signed)
Virtual Visit via Video Note  Consent was obtained for video visit:  Yes.   Answered questions that patient had about telehealth interaction:  Yes.   I discussed the limitations, risks, security and privacy concerns of performing an evaluation and management service by telemedicine. I also discussed with the patient that there may be a patient responsible charge related to this service. The patient expressed understanding and agreed to proceed.  Pt location: Home Physician Location: office Name of referring provider:  Anabel Halon, MD I connected with Pamela Roman at patients initiation/request on 07/22/2023 at 10:50 AM EST by video enabled telemedicine application and verified that I am speaking with the correct person using two identifiers. Pt MRN:  [id number] Pt DOB:  05-09-1956 Video Participants:  Pamela Roman  Assessment/Plan:   Multiple sclerosis Migraine without aura, without status migrainosus, not intractable     1.  DMT:  Mavenclad 2.  Migraine prevention:  zonisamide 100mg  QD 3.  Migraine rescue:  Sumatriptan 100mg  4.  D3 5000 IU daily   5.  Once discharged from rehab, will need home PT and home health   Subjective:  Pamela Roman is a 68 year old left-handed white female who follows up for multiple sclerosis.  History supplemented by hospital notes.  MRIs personally reviewd.   UPDATE: Current DMT:  Mavenclad (started April 2024) Other current medications:  Sertraline 25mg  daily; sumatriptan 100mg  PRN; D 5000 IU daily; levothyroxine; Aleve BID for pain, zonisamide 100mg  every day  Admitted to [facility] on 07/12/2023 for concern of MS flare.  For several days, she had been experiencing confusion and bilateral lower extremity weakness.  MRIs of neuro-axis revealed no acute or progressed findings.  She was diagnosed with the flu.  Currently in SNF and therefore had to change visit to a virtual visit.   Started therapy yesterday  Has a caregiver that comes by  home twice a week.  07/12/2023 MRI BRAIN W WO:  No visible change since the study of September. Widespread foci of demyelination within the cerebellum, brainstem and cerebral hemispheric white matter. No new or progressive lesions. No lesions show restricted diffusion or contrast enhancement. 07/12/2023 MRI C-SPINE W WO:  1. Numerous foci of abnormal T2 signal within the cervical cord without evidence of change or progression since September. Cord volume loss at the C7-T1 level is similar. No abnormal regional enhancement. 2. Mild non-compressive spondylosis at C5-6 and C6-7. 07/12/2023 MRI T-SPINE W WO:  1. No change since the prior study. Small foci of T2 signal within the cord consistent with the history of multiple sclerosis. No evidence of progressive disease. No abnormal contrast enhancement. 2. Old healed partial compression fractures at T3, T5, T6, T10 and T11. Old fracture at T12 with previous vertebral augmentation. No evidence of recent or progressive fracture. 07/12/2023 MRI L-SPINE W WO:  1. Old healed augmented fracture at L1. Old healed inferior endplate fracture at L2. No new fracture, progression or ongoing edema. 2. Mild degenerative changes elsewhere in the lumbar spine as outlined above. Mild narrowing of the lateral recesses and foramina at L2-3 and L3-4 but no visible neural compression. 03/26/2023 MRI BRAIN W WO:  Similar extensive chronic demyelination intracranially and in the cervical and thoracic cord. No new lesions or evidence of active demyelination. 03/26/2023 MRI C & T-SPINE W WO:  Similar extensive chronic demyelination intracranially and in the cervical and thoracic cord. No new lesions or evidence of active demyelination.   Vision:  If  she looks at distance, she sees horizontal double vision.  Trouble writing and reading.  She had cataract surgery in 2021 and it didn't improve.  Likely related to INO. Motor: Significant weakness in the legs. Sensory:  numb in  legs. Gait:  wheelchair bound.   Bowel/Bladder:  Reports both bowel and bladder incontinence.  Fatigue:  yes Cognition:  Word finding issues.   Mood:  Worsening depression.  Seen in the ED at [facility] on 8/8 afater deliberately ingesting the full 90 day bottle of sertraline.  Does not feel suicidal.   Migraines:  On zonisamide.  Now infrequent  Aborts quickly with sumatriptan. On disability.  She does have home PT but reports she otherwise does not have home health care.  07/12/2023 LABS:  CBC with WBC 7.2, HGB 14.2, HCT 43.7, PLT 173, ALC 0.3; hepatic panel with t bili 0.8, ALP 79, AST 22, ALT 15 07/18/2023 LABS:  BMP with Na 139, K 3.6, Cl 109, CO2 22, glucose 90, BUN 23, Cr 0.60, Ca 8.4, GFR >60     HISTORY: She was diagnosed with multiple sclerosis in the early 1990s after experiencing right optic neuritis.  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  She underwent LP and MRIs, which were consistent with MS.  She initially started Betaseron and then Avonex until 2011, after which she was switched to Tysabri.  Within the last 5 years, she has had progression of disease, but particularly worse since moving here in March.  She has fallen 4 times.  She started using a walker about 5 years ago.     She had an MS flare in May 2021, describing increased difficulty walking and bilateral lower extremity numbness and tingling.  For paresthesias, she was started on gabapentin.  MRI of spinal cord showed acute lesions.  Started Mayzent in July 2021 but patient noted increased paresthesias in the legs.  She was prescribed high-dose prednisone.  In September 2023, she reported worsening bilateral leg weakness.  Home Solu-Medrol infusions were too expensive, so she received high-dose prednisone that seemed to work and was back to baseline.  On 12/4, her legs became very weak again and she couldn't move them. However, weakness resolved overnight.  The weakness returned on 12/9, for which she was  admitted to [facility].  MRI of brain/cervical/thoracic with and without contrast on 12/11 revealed no new lesions in brain and cervical spine but did demonstrate new lesion at T12-L1 and enhancing lesion at T5-T6.  She received 3 day course of 1gm IV Solu-Medrol and discharged to SNF.  She has chronic low back pain with vertebral fractures and osteoporosis.  NCV-EMG of lower extremities on 09/23/2021 showed bilateral chronic L3-4 radiculopathy.  MRI Lumbar spine on 11/15/2020 showed multilevel spondylosis with moderate spinal canal stenosis and severe bilateral lateral recess stenosis at L4-L5 as well as chronic compression deformities of T11, L1, and L2, including severe right foraminal stenosis at L2-3.   Declined referral to neurosurgery at that time.    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 She then was receiving Solu-Medrol every month.  Mayzent from June 2021-Jan 2024 (breakthrough flare) Other past medications: Gabapentin (weakness)   Imaging: 06/15/2022 MRI BRAIN W WO:  Findings consistent with the patient's history of multiple sclerosis. No new lesions are visualized in the brain 06/15/2022 MRI C-SPINE W WO:  No new  lesions are visualized in the cervical spine.  No evidence of active demyelination in the cervical spine. 06/15/2022 MRI T-SPINE W WO:   Extensive demyelinating lesions throughout the thoracic spinal cord, with a new demyelinating lesion seen at the T12-L1 level. There may be a focal region of contrast enhancement at the T5-T6 level, which could suggest suggesting active demyelination. New acute to subacute superior endplate compression deformity at the T10 level. 12/31/2021 MRI BRAIN W WO:  1. Findings consistent with widespread chronic demyelinating disease/multiple sclerosis. Overall, disease burden is not significantly changed from previous, with no definite new  lesions. No evidence for active demyelination on today's exam. 2. Underlying mildly advanced cerebral atrophy for age. 12/31/2021 MRI C-SPINE W WO:  1. No significant interval change in extensive patchy signal abnormality throughout the cervical spinal cord, consistent with history of chronic multiple sclerosis. No new lesions or significant disease progression. No evidence for active demyelination.  2. New mild chronic compression deformity at the superior endplate of T3.  3. Moderate spondylosis at C5-6 and C6-7 without significant stenosis. 12/31/2021 MRI T-SPINE W WO:  1. Diffuse patchy and hazy signal abnormality involving the majority of the thoracic cord, most pronounced at the level of T6-7. In comparison with prior exam, these changes are likely similar, although prior exam is degraded by motion, consistent with history of chronic demyelinating disease/multiple sclerosis. Overall, appearance is similar to prior, without evidence for significant disease progression. No definite new lesions. No active demyelination. 2. New chronic compression deformities involving the T3, T5, and T6 vertebral bodies. Additional chronic compression fractures at T11 through L2 are stable. 09/16/2020 MRI BRAIN W WO:  stable with resolution of previous small active lesion 09/09/2020 MRI C & T-SPINE W WO:  1. Unchanged distribution of demyelinating lesions throughout the cervical spinal cord. Motion degraded images of the thoracic spine,but grossly unchanged. 2. No evidence of active demyelination within the cervical or thoracic spine.  04/14/2020 MRI BRAIN W WO:  subcentimeter focus of enhancement within the left posterior medulla, consistent with active demyelination. 12/23/2019 MRI CERVICAL SPINE W WO:  1. Extensive patchy T2 signal abnormality throughout the cervical spinal cord, consistent with demyelinating disease/multiple sclerosis. Faint patchy postcontrast enhancement about a few lesions.  AT C4 AND C7,  consistent with active demyelination.  2. Mild multilevel cervical spondylosis without significant spinal stenosis. Mild bilateral C6 foraminal narrowing. 12/23/2019 MRI THORACIC SPINE W WO:  1. Patchy signal abnormality throughout much of the thoracic spinal cord, consistent with demyelinating disease/multiple sclerosis. No evidence for active demyelination within the thoracic spinal cord.  2. Acute to subacute compression fracture extending through the inferior endplate of L2 with up to 40% height loss and 4 mm of bony retropulsion, incompletely assessed on this exam. Finding could be further evaluated with dedicated MRI of the lumbar spine as clinically desired.  3. Additional chronic lower thoracic compression fractures as above. 07/24/2019 MRI BRAIN W WO:  1.  Bilateral periventricular and radial white matter hyperintensities involving the frontal parietal and temporal regions bilaterally stable compared to 04/25/19.  2.  Compared to prior study the white matter hyperintensities, T1 low signal foci associated with these, and the absence of contrast enhancement appears stable.  3.  No evidence of new plaques or contrast enhancement to suggest new inflammatory foci. 04/25/2019 MRI BRAIN W WO:  CEREBRUM:  Extensive multiple sclerosis plaques are visualized.  These is seen adjacent to the tmporal horns bilaterally extending to surround the atria and occipital horns bilaterally and extensively  throughout the periventricular white matter bilateral.  These also extend into the bifrontal corona radiata and centrum semiovale.  No involvement of brainstem or cerebellum.   Past Medical History: Past Medical History:  Diagnosis Date   Depression    [employer] 08/06/2020   Hx of migraines    Hyperglycemia    Hypothyroidism    Migraines    Multiple sclerosis (HCC)     Medications: Current Outpatient Medications on File Prior to Visit  Medication Sig Dispense Refill   acetaminophen (TYLENOL) 325 MG tablet  Take 2 tablets (650 mg total) by mouth every 6 (six) hours as needed for mild pain (pain score 1-3), fever or headache.     albuterol (PROVENTIL) (2.5 MG/3ML) 0.083% nebulizer solution Take 3 mLs (2.5 mg total) by nebulization every 4 (four) hours as needed for wheezing or shortness of breath. 75 mL 12   alendronate (FOSAMAX) 70 MG tablet TAKE 1 TABLET ONCE A WEEK WITH A FULL GLASS OF WATER ON AN EMPTY STOMACH (Patient taking differently: Take 70 mg by mouth once a week.) 12 tablet 3   BETA CAROTENE PO Take 1 capsule by mouth in the morning. Vitamin A, unknown strength.     Cholecalciferol (VITAMIN D3) 125 MCG (5000 UT) CAPS Take 1 capsule (5,000 Units total) by mouth daily. 30 capsule 11   Cladribine, 10 Tabs, (MAVENCLAD, 10 TABS,) 10 MG TBPK      dextromethorphan (DELSYM) 30 MG/5ML liquid Take 5 mLs (30 mg total) by mouth 2 (two) times daily as needed for cough. 89 mL 0   Multiple Vitamins-Minerals (ONE-A-DAY WOMENS 50 PLUS) TABS Take 1 tablet by mouth in the morning.     pantoprazole (PROTONIX) 40 MG tablet Take 1 tablet (40 mg total) by mouth daily. 30 tablet 1   simvastatin (ZOCOR) 20 MG tablet TAKE 1 TABLET DAILY 90 tablet 3   SUMAtriptan (IMITREX) 100 MG tablet TAKE 1 TABLET EVERY 2 HOURS AS NEEDED FOR MIGRAINE. MAY REPEAT IN 2 HOURS IF HEADACHE PERSISTS OR RECURS (Patient taking differently: Take 100 mg by mouth See admin instructions. 100 mg at the onset of migraine. May repeat 100 mg in 2 hours if headache persists or recurs.) 27 tablet 0   zonisamide (ZONEGRAN) 100 MG capsule Take 100 mg by mouth at bedtime.     diazepam (VALIUM) 5 MG tablet TAKE 1 TABLET BY MOUTH 30 TO 40 MINUTES BEFORE MRI (Patient not taking: Reported on 07/22/2023) 2 tablet 0   levothyroxine (SYNTHROID) 50 MCG tablet TAKE 1 TABLET DAILY BEFORE BREAKFAST 90 tablet 3   sertraline (ZOLOFT) 25 MG tablet Take 1 tablet (25 mg total) by mouth daily. 90 tablet 1   No current facility-administered medications on file prior to  visit.     Allergies: Allergies  Allergen Reactions   Codeine Nausea And Vomiting    Family History: Family History  Problem Relation Age of Onset   Dementia Mother    Dementia Father     Observations/Objective:   No acute distress.  Alert and oriented.  Speech fluent and not dysarthric.  Language intact.  Eyes orthophoric on primary gaze.  Face symmetric.   Follow Up Instructions:    -I discussed the assessment and treatment plan with the patient. The patient was provided an opportunity to ask questions and all were answered. The patient agreed with the plan and demonstrated an understanding of the instructions.   The patient was advised to call back or seek an in-person evaluation if the symptoms  worsen or if the condition fails to improve as anticipated.   Cira Servant, DO

## 2023-07-22 ENCOUNTER — Encounter: Payer: Self-pay | Admitting: Neurology

## 2023-07-22 ENCOUNTER — Telehealth (INDEPENDENT_AMBULATORY_CARE_PROVIDER_SITE_OTHER): Payer: Medicare Other | Admitting: Neurology

## 2023-07-22 VITALS — Wt 130.0 lb

## 2023-07-22 DIAGNOSIS — G35 Multiple sclerosis: Secondary | ICD-10-CM

## 2023-07-22 DIAGNOSIS — G43009 Migraine without aura, not intractable, without status migrainosus: Secondary | ICD-10-CM

## 2023-07-26 ENCOUNTER — Telehealth: Payer: Self-pay | Admitting: Internal Medicine

## 2023-07-26 ENCOUNTER — Telehealth: Payer: Self-pay | Admitting: Neurology

## 2023-07-26 NOTE — Telephone Encounter (Signed)
Pt called in and left a message with the access nurse on 07/25/23. Caller states she has another virus that is causing a problem for her.

## 2023-07-26 NOTE — Telephone Encounter (Signed)
Copied from CRM [id number]. Topic: Referral - Request for Referral >> Jul 26, 2023  9:02 AM Fuller Mandril wrote: Did the patient discuss referral with their provider in the last year? No (If No - schedule appointment) (If Yes - send message)  Appointment offered? No  Type of order/referral and detailed reason for visit: Rehab facility for physical therapy for legs. Was in Hospital for flu and wiped her legs out. Transferred to rehab and then got Norovirus and is requesting referral to a different facility. Does not want to be in the current one long term. Requesting better care.   Preference of office, provider, location: N/A  If referral order, have you been seen by this specialty before? Yes Currently in [facility] (If Yes, this issue or another issue? When? Where?  Can we respond through MyChart? No

## 2023-07-26 NOTE — Telephone Encounter (Signed)
Pt verbalized understanding states she may consider this when she is out of rehab.

## 2023-07-26 NOTE — Telephone Encounter (Signed)
Per patient she will be in rehab. She restarted PT for her leg weakness.

## 2023-07-29 NOTE — Telephone Encounter (Signed)
Left message with the after hour service on 07-29-23 at 1:07 pm   Caller states that she was release from the hospital and is in rehab now. She contracted Norovirus and does not feel like she is getting better. She had just gotten over the flu she has diarrhea. She has MS and would like to get Dr Everlena Cooper input on this

## 2023-07-30 NOTE — Telephone Encounter (Signed)
Patient advised of Dr.Jaffe note, There isn't anything we can do.  We just have to continue waiting.  She didn't have an active MS flare.  She had a pseudo-exacerbation due to the flu which aggravates chronic and previous MS flare symptoms.

## 2023-08-06 ENCOUNTER — Telehealth: Payer: Self-pay | Admitting: Neurology

## 2023-08-06 NOTE — Telephone Encounter (Signed)
Pt called in stating she is going home from the rehab facility tomorrow. She is wondering if Dr. Everlena Cooper might consider ordering physical therapy for her? She is getting it in the rehab, but she feels she has a long way to go

## 2023-08-09 ENCOUNTER — Telehealth: Payer: Self-pay | Admitting: Neurology

## 2023-08-09 NOTE — Telephone Encounter (Signed)
Clydie Braun from [facility] called to have order sent for PT&OT

## 2023-08-09 NOTE — Telephone Encounter (Signed)
Addy LM with [facility]. Caller states she is calling regarding a mutual patient. She is calling to see if pt is going to take the 2nd year of the medication.

## 2023-08-12 NOTE — Telephone Encounter (Signed)
 Paperwork faxed to [facility] line

## 2023-08-16 NOTE — Progress Notes (Signed)
BH MD/PA/NP OP Progress Note  08/19/2023 10:19 AM Pamela Roman  MRN:  [id number]  Visit Diagnosis:    ICD-10-CM   1. Severe episode of recurrent major depressive disorder, without psychotic features (HCC)  F33.2 sertraline (ZOLOFT) 25 MG tablet    2. Multiple sclerosis (HCC)  G35        Assessment: Pamela Roman is a 68 y.o. female with a history of MDD who presented to [facility] for initial evaluation on 02/26/22 following a suicide attempt by intentional overdose on 02/10/22.   At initial evaluation patient reported that she had been struggling with depressed mood, fatigue, isolated, and hopelessness about her future leading up to the suicide attempt. She denied any prior self harm or thoughts of suicide prior to that along with and AVH, manic symptoms, paranoia, or feelings of being a burden. Of note patient has MS that has been progressing in severity which had in turn left her feeling isolated. Since discharging from the inpatient psychiatric facility patient reported feeling more hopeful, future oriented, less isolated (family in touch and visiting more), and motivated to make adjustments in her life so she can engage in the activities she enjoys. Patient denied any suicidality at time of initial evaluation and was able to engage in making a safety plan with several steps for if depression/suicidality were to progress.   Pamela Roman presents for follow-up evaluation. Today, 08/19/23, patient reports that mood has been stable in the interim despite significant psychosocial stressors.  She has continued to take the Zoloft 25 mg daily and denies any adverse side effects.  We will continue on current regimen and follow-up in 6 months.  Plan: - Continue Zoloft 25 mg QD - TSH, Vit D WNL - MRI brain and spine reviewed, new demyelinating lesions in the the thoracic spine visualized - Crisis response plan discussed, along with crisis resources - Therapy/care coordination  with Danford Bad once a week - Supportive interviewing techniques employed, patient working towards isolating less and connecting with more social supports - Follow up in 6 months  Chief Complaint:  Chief Complaint  Patient presents with   Follow-up   HPI: Pamela Roman presents reporting that she is doing well. She had an issue with the flu in the interim followed by norovirus when she was at a rehab to recover. Now she is back home and getting back to herself. With the illnesses her physical ability has declined, but she is slowly recovering to their former strength levels.  She denies her depression getting worse during this time. There was even a time she fell on the floor with the weakness and it did not get her down, despite her inability to get up for a while after falling. [name] believes this is the first time that she fell on the floor and did not cry.   She has had some improvement in her insight into her MS and is going to focus on strengthening her overall strength including her arms. She is getting a regular wheel chair and will use that when able. She has her caregiver come 2 days a week still and they now spend 1 day a week exercising.  Past Psychiatric History: Patient reports that she had no prior psych history until 2021. At that time she was struggling with grief and depression following the passing of her husband and was started on Zoloft 25 mg, which she continued up until she overdosed on 02/10/22. [facility] ws discharged on Zoloft 50 mg QD. She reported stopping the medication after her discharge as she was feeling better. Patient was restarted on Zoloft 25 mg for another year. She had reported continued improved mood over this time and it was decided to trial tapering off medications to see if mood remained upbeat. She denies any trials of other psychotropic medications.   Past Medical History:  Past Medical  History:  Diagnosis Date   Depression    Phreesia 08/06/2020   Hx of migraines    Hyperglycemia    Hypothyroidism    Migraines    Multiple sclerosis (HCC)     Past Surgical History:  Procedure Laterality Date   CATARACT EXTRACTION W/PHACO Left 01/01/2020   Procedure: CATARACT EXTRACTION PHACO AND INTRAOCULAR LENS PLACEMENT LEFT EYE;  Surgeon: Fabio Pierce, MD;  Location: [facility];  Service: Ophthalmology;  Laterality: Left;  CDE: 3.37   CATARACT EXTRACTION W/PHACO Right 01/19/2020   Procedure: CATARACT EXTRACTION PHACO AND INTRAOCULAR LENS PLACEMENT RIGHT EYE;  Surgeon: Fabio Pierce, MD;  Location: [facility];  Service: Ophthalmology;  Laterality: Right;  CDE: 3.16   TONSILLECTOMY      Family Psychiatric History: Denies  Family History:  Family History  Problem Relation Age of Onset   Dementia Mother    Dementia Father     Social History:  Social History   Socioeconomic History   Marital status: Widowed    Spouse name: Not on file   Number of children: 0   Years of education: 32   Highest education level: Master's degree (e.g., MA, MS, MEng, MEd, MSW, MBA)  Occupational History   Not on file  Tobacco Use   Smoking status: Never    Passive exposure: Never   Smokeless tobacco: Never   Tobacco comments:    Lived with smoker  Vaping Use   Vaping status: Never Used  Substance and Sexual Activity   Alcohol use: Not Currently   Drug use: Never   Sexual activity: Not Currently    Partners: Male  Other Topics Concern   Not on file  Social History Narrative   ** Merged History Encounter ** Lives with her sister two story home stays on the first floor   Left handed   Caffeine none   Social Drivers of Health   Financial Resource Strain: Low Risk  (08/18/2023)   Overall Financial Resource Strain (CARDIA)    Difficulty of Paying Living Expenses: Not very hard  Food Insecurity: No Food Insecurity (08/18/2023)   Hunger Vital Sign    Worried About Running Out of Food in  the Last Year: Never true    Ran Out of Food in the Last Year: Never true  Transportation Needs: No Transportation Needs (08/18/2023)   PRAPARE - Administrator, Civil Service (Medical): No    Lack of Transportation (Non-Medical): No  Physical Activity: Insufficiently Active (08/18/2023)   Exercise Vital Sign    Days of Exercise per Week: 2 days    Minutes of Exercise per Session: 20 min  Stress: Stress Concern Present (08/18/2023)   [employer] of Occupational Health - Occupational Stress Questionnaire    Feeling of Stress : To some extent  Social Connections: Moderately Integrated (08/18/2023)   Social Connection and Isolation Panel [NHANES]    Frequency of Communication with Friends and Family: Twice a week    Frequency of Social Gatherings with Friends and Family: Twice a week    Attends Religious Services:  More than 4 times per year    Active Member of Clubs or Organizations: Yes    Attends Banker Meetings: More than 4 times per year    Marital Status: Widowed  Recent Concern: Social Connections - Moderately Isolated (07/12/2023)   Social Connection and Isolation Panel [NHANES]    Frequency of Communication with Friends and Family: Twice a week    Frequency of Social Gatherings with Friends and Family: Once a week    Attends Religious Services: More than 4 times per year    Active Member of [employer] or Organizations: No    Attends Banker Meetings: Never    Marital Status: Widowed    Allergies:  Allergies  Allergen Reactions   Codeine Nausea And Vomiting    Current Medications: Current Outpatient Medications  Medication Sig Dispense Refill   acetaminophen (TYLENOL) 325 MG tablet Take 2 tablets (650 mg total) by mouth every 6 (six) hours as needed for mild pain (pain score 1-3), fever or headache.     albuterol (PROVENTIL) (2.5 MG/3ML) 0.083% nebulizer solution Take 3 mLs (2.5 mg total) by nebulization every 4 (four) hours as needed for  wheezing or shortness of breath. 75 mL 12   alendronate (FOSAMAX) 70 MG tablet TAKE 1 TABLET ONCE A WEEK WITH A FULL GLASS OF WATER ON AN EMPTY STOMACH (Patient taking differently: Take 70 mg by mouth once a week.) 12 tablet 3   BETA CAROTENE PO Take 1 capsule by mouth in the morning. Vitamin A, unknown strength.     Cholecalciferol (VITAMIN D3) 125 MCG (5000 UT) CAPS Take 1 capsule (5,000 Units total) by mouth daily. 30 capsule 11   Cladribine, 10 Tabs, (MAVENCLAD, 10 TABS,) 10 MG TBPK      dextromethorphan (DELSYM) 30 MG/5ML liquid Take 5 mLs (30 mg total) by mouth 2 (two) times daily as needed for cough. 89 mL 0   diazepam (VALIUM) 5 MG tablet TAKE 1 TABLET BY MOUTH 30 TO 40 MINUTES BEFORE MRI (Patient not taking: Reported on 07/22/2023) 2 tablet 0   levothyroxine (SYNTHROID) 50 MCG tablet TAKE 1 TABLET DAILY BEFORE BREAKFAST 90 tablet 3   Multiple Vitamins-Minerals (ONE-A-DAY WOMENS 50 PLUS) TABS Take 1 tablet by mouth in the morning.     pantoprazole (PROTONIX) 40 MG tablet Take 1 tablet (40 mg total) by mouth daily. 30 tablet 1   sertraline (ZOLOFT) 25 MG tablet Take 1 tablet (25 mg total) by mouth daily. 90 tablet 1   simvastatin (ZOCOR) 20 MG tablet TAKE 1 TABLET DAILY 90 tablet 3   SUMAtriptan (IMITREX) 100 MG tablet TAKE 1 TABLET EVERY 2 HOURS AS NEEDED FOR MIGRAINE. MAY REPEAT IN 2 HOURS IF HEADACHE PERSISTS OR RECURS (Patient taking differently: Take 100 mg by mouth See admin instructions. 100 mg at the onset of migraine. May repeat 100 mg in 2 hours if headache persists or recurs.) 27 tablet 0   zonisamide (ZONEGRAN) 100 MG capsule Take 100 mg by mouth at bedtime.     No current facility-administered medications for this visit.     Psychiatric Specialty Exam: Review of Systems  There were no vitals taken for this visit.There is no height or weight on file to calculate BMI.  General Appearance: Well Groomed  Eye Contact:  Good  Speech:  Clear and Coherent  Volume:  Normal  Mood:   Euthymic  Affect:  Congruent  Thought Process:  Coherent and Goal Directed  Orientation:  Full (Time, Place, and  Person)  Thought Content: Logical   Suicidal Thoughts:  No  Homicidal Thoughts:  No  Memory:  Immediate;   Good  Judgement:  Good  Insight:  Good  Psychomotor Activity:  NA  Concentration:  Concentration: Good  Recall:  Good  Fund of Knowledge: Good  Language: Good  Akathisia:  No    AIMS (if indicated): not done  Assets:  Communication Skills Desire for Improvement  ADL's:  Intact  Cognition: WNL  Sleep:  Good   Metabolic Disorder Labs: Lab Results  Component Value Date   HGBA1C 5.7 (H) 07/16/2022   No results found for: "PROLACTIN" Lab Results  Component Value Date   CHOL 218 (H) 07/16/2022   TRIG 128 07/16/2022   HDL 51 07/16/2022   CHOLHDL 4.3 07/16/2022   LDLCALC 144 (H) 07/16/2022   LDLCALC 123 (H) 11/06/2020   Lab Results  Component Value Date   TSH 0.621 07/12/2023   TSH 2.190 07/16/2022    Therapeutic Level Labs: No results found for: "LITHIUM" No results found for: "VALPROATE" No results found for: "CBMZ"   Screenings: GAD-7    Flowsheet Row Video Visit from 10/05/2022 in [facility] Office Visit from 06/19/2022 in [facility]  Total GAD-7 Score 0 0      Mini-Mental    Flowsheet Row Office Visit from 03/12/2021 in [facility]  Total Score (max 30 points ) 30      PHQ2-9    Flowsheet Row Clinical Support from 12/09/2022 in [facility] Video Visit from 10/05/2022 in [facility] Office Visit from 07/16/2022 in [facility] Office Visit from 06/19/2022 in [facility] Care Coordination from 04/01/2022 in [facility] Care Coordination  PHQ-2 Total Score 0 0 0 0 4  PHQ-9 Total Score -- 0 -- 0 10      Flowsheet Row ED to Hosp-Admission (Discharged) from  07/12/2023 in [facility] ED from 12/09/2022 in [facility] ED to Hosp-Admission (Discharged) from 06/14/2022 in [facility]  C-SSRS RISK CATEGORY No Risk No Risk No Risk       Collaboration of Care: Collaboration of Care: Primary Care Provider AEB chart review and Other provider involved in patient's care AEB hospital and neuro chart review  Patient/Guardian was advised Release of Information must be obtained prior to any record release in order to collaborate their care with an outside provider. Patient/Guardian was advised if they have not already done so to contact the registration department to sign all necessary forms in order for [place] to release information regarding their care.   Consent: Patient/Guardian gives verbal consent for treatment and assignment of benefits for services provided during this visit. Patient/Guardian expressed understanding and agreed to proceed.    Stasia Cavalier, MD 08/19/2023, 10:19 AM   Virtual Visit via Video Note  I connected with Gara Kroner on 08/19/23 at 10:00 AM EST by a video enabled telemedicine application and verified that I am speaking with the correct person using two identifiers.  Location: Patient: Home Provider: Home Office   I discussed the limitations of evaluation and management by telemedicine and the availability of in person appointments. The patient expressed understanding and agreed to proceed.   I discussed the assessment and treatment plan with the patient. The patient was provided an opportunity to ask questions and  all were answered. The patient agreed with the plan and demonstrated an understanding of the instructions.   The patient was advised to call back or seek an in-person evaluation if the symptoms worsen or if the condition fails to improve as anticipated.  I provided 20 minutes of non-face-to-face time during this encounter.   Stasia Cavalier, MD

## 2023-08-19 ENCOUNTER — Encounter: Payer: Self-pay | Admitting: Internal Medicine

## 2023-08-19 ENCOUNTER — Encounter (HOSPITAL_COMMUNITY): Payer: Self-pay | Admitting: Psychiatry

## 2023-08-19 ENCOUNTER — Telehealth (HOSPITAL_BASED_OUTPATIENT_CLINIC_OR_DEPARTMENT_OTHER): Payer: Medicare Other | Admitting: Psychiatry

## 2023-08-19 ENCOUNTER — Ambulatory Visit (INDEPENDENT_AMBULATORY_CARE_PROVIDER_SITE_OTHER): Payer: Medicare Other | Admitting: Internal Medicine

## 2023-08-19 VITALS — BP 119/73 | HR 76 | Ht 59.0 in | Wt 135.8 lb

## 2023-08-19 DIAGNOSIS — G35 Multiple sclerosis: Secondary | ICD-10-CM

## 2023-08-19 DIAGNOSIS — F332 Major depressive disorder, recurrent severe without psychotic features: Secondary | ICD-10-CM | POA: Diagnosis not present

## 2023-08-19 DIAGNOSIS — F331 Major depressive disorder, recurrent, moderate: Secondary | ICD-10-CM | POA: Diagnosis not present

## 2023-08-19 DIAGNOSIS — R531 Weakness: Secondary | ICD-10-CM | POA: Diagnosis not present

## 2023-08-19 DIAGNOSIS — E039 Hypothyroidism, unspecified: Secondary | ICD-10-CM

## 2023-08-19 MED ORDER — SERTRALINE HCL 25 MG PO TABS
25.0000 mg | ORAL_TABLET | Freq: Every day | ORAL | 1 refills | Status: DC
Start: 2023-08-19 — End: 2024-02-17

## 2023-08-19 NOTE — Patient Instructions (Signed)
Please continue to take medications as prescribed.  You are being referred to home health for PT/OT.

## 2023-08-19 NOTE — Progress Notes (Unsigned)
Established Patient Office Visit  Subjective:  Patient ID: Pamela Roman, female    DOB: 1956-04-27  Age: 68 y.o. MRN: [id number]  CC:  Chief Complaint  Patient presents with   Care Management    Would like orders for home health physical therapy.     HPI Pamela Roman is a 68 y.o. female with past medical history of progressive multiple sclerosis, physical deconditioning, osteoporosis, multiple falls, hypothyroidism and depression who presents for f/u of her chronic medical conditions.  She was admitted for influenza in 01/25 and was discharged to [facility].  She had contracted norovirus infection while in the rehab facility, but was treated with fluids. She also had MRI of brain and thoracic lumbar spine due to recent worsening of leg weakness, which were overall stable compared to prior.   MS: She takes Mayzent for MS and follows up with neurologist. She reports having multiple falls in the past. She complains of recent worsening of fatigue and leg weakness. She has difficulty ambulating in her home due to progressive leg weakness.  She has a powered wheelchair for better mobility and to reduce her risk of recurrent falls.  She is undergoing home PT for better mobility. She needs bedside commode and raised toilet seat to avoid falls.  Hypothyroidism: She takes levothyroxine 50 mcg QD.  She denies any recent change in weight or appetite.  MDD: She takes Zoloft 25 mg daily.  Followed by psychiatry now.  Denies any anhedonia, SI or HI currently.   Past Medical History:  Diagnosis Date   Depression    [employer]ia 08/06/2020   Hx of migraines    Hyperglycemia    Hypothyroidism    Migraines    Multiple sclerosis (HCC)     Past Surgical History:  Procedure Laterality Date   CATARACT EXTRACTION W/PHACO Left 01/01/2020   Procedure: CATARACT EXTRACTION PHACO AND INTRAOCULAR LENS PLACEMENT LEFT EYE;  Surgeon: Fabio Pierce, MD;  Location: [facility];  Service: Ophthalmology;  Laterality: Left;   CDE: 3.37   CATARACT EXTRACTION W/PHACO Right 01/19/2020   Procedure: CATARACT EXTRACTION PHACO AND INTRAOCULAR LENS PLACEMENT RIGHT EYE;  Surgeon: Fabio Pierce, MD;  Location: [facility];  Service: Ophthalmology;  Laterality: Right;  CDE: 3.16   TONSILLECTOMY      Family History  Problem Relation Age of Onset   Dementia Mother    Dementia Father     Social History   Socioeconomic History   Marital status: Widowed    Spouse name: Not on file   Number of children: 0   Years of education: 52   Highest education level: Master's degree (e.g., MA, MS, MEng, MEd, MSW, MBA)  Occupational History   Not on file  Tobacco Use   Smoking status: Never    Passive exposure: Never   Smokeless tobacco: Never   Tobacco comments:    Lived with smoker  Vaping Use   Vaping status: Never Used  Substance and Sexual Activity   Alcohol use: Not Currently   Drug use: Never   Sexual activity: Not Currently    Partners: Male  Other Topics Concern   Not on file  Social History Narrative   ** Merged History Encounter ** Lives with her sister two story home stays on the first floor   Left handed   Caffeine none   Social Drivers of Health   Financial Resource Strain: Low Risk  (08/18/2023)   Overall Financial Resource Strain (CARDIA)    Difficulty of Paying Living  Expenses: Not very hard  Food Insecurity: No Food Insecurity (08/18/2023)   Hunger Vital Sign    Worried About Running Out of Food in the Last Year: Never true    Ran Out of Food in the Last Year: Never true  Transportation Needs: No Transportation Needs (08/18/2023)   PRAPARE - Administrator, Civil Service (Medical): No    Lack of Transportation (Non-Medical): No  Physical Activity: Insufficiently Active (08/18/2023)   Exercise Vital Sign    Days of Exercise per Week: 2 days    Minutes of Exercise per Session: 20 min  Stress: Stress Concern Present (08/18/2023)   [employer] of Occupational Health - Occupational  Stress Questionnaire    Feeling of Stress : To some extent  Social Connections: Moderately Integrated (08/18/2023)   Social Connection and Isolation Panel [NHANES]    Frequency of Communication with Friends and Family: Twice a week    Frequency of Social Gatherings with Friends and Family: Twice a week    Attends Religious Services: More than 4 times per year    Active Member of [employer] or Organizations: Yes    Attends Banker Meetings: More than 4 times per year    Marital Status: Widowed  Recent Concern: Social Connections - Moderately Isolated (07/12/2023)   Social Connection and Isolation Panel [NHANES]    Frequency of Communication with Friends and Family: Twice a week    Frequency of Social Gatherings with Friends and Family: Once a week    Attends Religious Services: More than 4 times per year    Active Member of [employer] or Organizations: No    Attends Banker Meetings: Never    Marital Status: Widowed  Intimate Partner Violence: Not At Risk (07/12/2023)   Humiliation, Afraid, Rape, and Kick questionnaire    Fear of Current or Ex-Partner: No    Emotionally Abused: No    Physically Abused: No    Sexually Abused: No    Outpatient Medications Prior to Visit  Medication Sig Dispense Refill   acetaminophen (TYLENOL) 325 MG tablet Take 2 tablets (650 mg total) by mouth every 6 (six) hours as needed for mild pain (pain score 1-3), fever or headache.     albuterol (PROVENTIL) (2.5 MG/3ML) 0.083% nebulizer solution Take 3 mLs (2.5 mg total) by nebulization every 4 (four) hours as needed for wheezing or shortness of breath. 75 mL 12   alendronate (FOSAMAX) 70 MG tablet TAKE 1 TABLET ONCE A WEEK WITH A FULL GLASS OF WATER ON AN EMPTY STOMACH (Patient taking differently: Take 70 mg by mouth once a week.) 12 tablet 3   BETA CAROTENE PO Take 1 capsule by mouth in the morning. Vitamin A, unknown strength.     Cholecalciferol (VITAMIN D3) 125 MCG (5000 UT) CAPS Take 1 capsule  (5,000 Units total) by mouth daily. 30 capsule 11   Cladribine, 10 Tabs, (MAVENCLAD, 10 TABS,) 10 MG TBPK      dextromethorphan (DELSYM) 30 MG/5ML liquid Take 5 mLs (30 mg total) by mouth 2 (two) times daily as needed for cough. 89 mL 0   diazepam (VALIUM) 5 MG tablet TAKE 1 TABLET BY MOUTH 30 TO 40 MINUTES BEFORE MRI 2 tablet 0   levothyroxine (SYNTHROID) 50 MCG tablet TAKE 1 TABLET DAILY BEFORE BREAKFAST 90 tablet 3   Multiple Vitamins-Minerals (ONE-A-DAY WOMENS 50 PLUS) TABS Take 1 tablet by mouth in the morning.     pantoprazole (PROTONIX) 40 MG tablet Take 1 tablet (  40 mg total) by mouth daily. 30 tablet 1   sertraline (ZOLOFT) 25 MG tablet Take 1 tablet (25 mg total) by mouth daily. 90 tablet 1   simvastatin (ZOCOR) 20 MG tablet TAKE 1 TABLET DAILY 90 tablet 3   SUMAtriptan (IMITREX) 100 MG tablet TAKE 1 TABLET EVERY 2 HOURS AS NEEDED FOR MIGRAINE. MAY REPEAT IN 2 HOURS IF HEADACHE PERSISTS OR RECURS (Patient taking differently: Take 100 mg by mouth See admin instructions. 100 mg at the onset of migraine. May repeat 100 mg in 2 hours if headache persists or recurs.) 27 tablet 0   zonisamide (ZONEGRAN) 100 MG capsule Take 100 mg by mouth at bedtime.     No facility-administered medications prior to visit.    Allergies  Allergen Reactions   Codeine Nausea And Vomiting    ROS Review of Systems  Constitutional:  Negative for chills and fever.  HENT:  Negative for congestion, sinus pain, sore throat and voice change.   Eyes:  Positive for visual disturbance. Negative for pain.  Respiratory:  Negative for cough and shortness of breath.   Cardiovascular:  Negative for chest pain and palpitations.  Gastrointestinal:  Negative for constipation, diarrhea, nausea and vomiting.  Endocrine: Negative for polydipsia and polyuria.  Genitourinary:  Negative for dysuria and hematuria.  Musculoskeletal:  Positive for arthralgias, back pain and gait problem. Negative for neck pain and neck stiffness.   Skin:  Negative for rash.  Neurological:  Positive for weakness and numbness. Negative for dizziness.  Psychiatric/Behavioral:  Negative for agitation and behavioral problems.       Objective:    Physical Exam Vitals reviewed.  Constitutional:      General: She is not in acute distress.    Appearance: She is not diaphoretic.     Comments: In wheelchair  HENT:     Head: Normocephalic and atraumatic.     Nose: Nose normal.     Mouth/Throat:     Mouth: Mucous membranes are moist.  Eyes:     General: No scleral icterus.    Extraocular Movements: Extraocular movements intact.  Cardiovascular:     Rate and Rhythm: Normal rate and regular rhythm.     Pulses: Normal pulses.     Heart sounds: Normal heart sounds. No murmur heard. Pulmonary:     Breath sounds: Normal breath sounds. No wheezing or rales.  Musculoskeletal:     Cervical back: Neck supple. No tenderness.     Right lower leg: No edema.     Left lower leg: No edema.  Skin:    General: Skin is warm.     Findings: No rash.  Neurological:     General: No focal deficit present.     Mental Status: She is alert and oriented to person, place, and time.     Sensory: Sensory deficit (B/l LE) present.     Motor: Weakness (B/l UE and LE - 3/5) present.  Psychiatric:        Mood and Affect: Mood normal.        Behavior: Behavior normal.     BP 119/73   Pulse 76   Ht 4\' 11"  (1.499 m)   Wt 135 lb 12.8 oz (61.6 kg)   SpO2 97%   BMI 27.43 kg/m  Wt Readings from Last 3 Encounters:  08/19/23 135 lb 12.8 oz (61.6 kg)  07/22/23 130 lb (59 kg)  07/12/23 124 lb 12.5 oz (56.6 kg)    Lab Results  Component Value Date  TSH 0.621 07/12/2023   Lab Results  Component Value Date   WBC 7.2 07/12/2023   HGB 14.2 07/12/2023   HCT 43.7 07/12/2023   MCV 92.6 07/12/2023   PLT 173 07/12/2023   Lab Results  Component Value Date   NA 139 07/18/2023   K 3.6 07/18/2023   CO2 22 07/18/2023   GLUCOSE 90 07/18/2023   BUN 23  07/18/2023   CREATININE 0.56 07/19/2023   BILITOT 0.8 07/12/2023   ALKPHOS 79 07/12/2023   AST 22 07/12/2023   ALT 15 07/12/2023   PROT 7.0 07/12/2023   ALBUMIN 4.0 07/12/2023   CALCIUM 8.4 (L) 07/18/2023   ANIONGAP 8 07/18/2023   EGFR 94 07/16/2022   GFR 80.58 07/20/2022   Lab Results  Component Value Date   CHOL 218 (H) 07/16/2022   Lab Results  Component Value Date   HDL 51 07/16/2022   Lab Results  Component Value Date   LDLCALC 144 (H) 07/16/2022   Lab Results  Component Value Date   TRIG 128 07/16/2022   Lab Results  Component Value Date   CHOLHDL 4.3 07/16/2022   Lab Results  Component Value Date   HGBA1C 5.7 (H) 07/16/2022      Assessment & Plan:   Problem List Items Addressed This Visit       Endocrine   Hypothyroidism   Lab Results  Component Value Date   TSH 0.621 07/12/2023   On Levothyroxine 50 mcg QD Check TSH and free T4        Nervous and Auditory   Multiple sclerosis (HCC) - Primary (Chronic)   On Mayzent 1 mg QD Progressive neurologic deficits Has paresthesia in the legs, physical deconditioning leading to wheelchair/rolling walker use - referred to home PT for strengthening exercises Follows up with Neurologist - last visit note reviewed      Relevant Orders   Ambulatory referral to Home Health   DME Bedside commode   Elevated toilet seat     Other   Generalized weakness (Chronic)   Recent worsening of bilateral leg weakness likely due to recent influenza and norovirus infection in addition to her progressive MS Referred to home health for PT/OT Has powered wheelchair for better mobility Sent prescription for raised toilet seat and bedside commode to avoid falls      Relevant Orders   Ambulatory referral to Home Health   Elevated toilet seat   MDD (major depressive disorder), recurrent episode, moderate (HCC)   Well controlled with Zoloft 25 mg QD Had suicidal attempt with Zoloft overdose in 2023 Was admitted at [facility] behavioral health facility Followed by Psychiatry        No orders of the defined types were placed in this encounter.   Follow-up: Return in about 6 months (around 02/16/2024) for Hypothyroidism and MS.    Anabel Halon, MD

## 2023-08-20 ENCOUNTER — Telehealth: Payer: Self-pay | Admitting: Neurology

## 2023-08-20 NOTE — Assessment & Plan Note (Addendum)
Lab Results  Component Value Date   TSH 0.621 07/12/2023   On Levothyroxine 50 mcg QD Check TSH and free T4

## 2023-08-20 NOTE — Assessment & Plan Note (Signed)
On Mayzent 1 mg QD Progressive neurologic deficits Has paresthesia in the legs, physical deconditioning leading to wheelchair/rolling walker use - referred to home PT for strengthening exercises Follows up with Neurologist - last visit note reviewed

## 2023-08-20 NOTE — Assessment & Plan Note (Signed)
Recent worsening of bilateral leg weakness likely due to recent influenza and norovirus infection in addition to her progressive MS Referred to home health for PT/OT Has powered wheelchair for better mobility Sent prescription for raised toilet seat and bedside commode to avoid falls

## 2023-08-20 NOTE — Assessment & Plan Note (Addendum)
Well controlled with Zoloft 25 mg QD Had suicidal attempt with Zoloft overdose in 2023 Was admitted at [facility] behavioral health facility Followed by Psychiatry

## 2023-08-20 NOTE — Telephone Encounter (Signed)
Called Zacharia and left a message for a call back.

## 2023-08-20 NOTE — Telephone Encounter (Signed)
Pamela Roman with occupation therapy called, she needs orders for therapy 1x a week for 7 weeks. She fell 08/16/23 at 2:10am but she is okay.

## 2023-08-23 NOTE — Telephone Encounter (Signed)
 Called Zacharia and left a message for a call back.

## 2023-08-24 NOTE — Telephone Encounter (Signed)
Called Pamela Roman and informed her that per Dr. Everlena Cooper we are not going to approve these orders because Dr. Everlena Cooper  hasn't seen patient face to face in office for exam for several months, therefore her insurance will not approve an order from me.  She just saw her PCP yesterday.  I recommend that the request the order to come from him   [name] verbalized understanding.

## 2023-09-27 ENCOUNTER — Telehealth: Payer: Self-pay | Admitting: Neurology

## 2023-09-27 NOTE — Telephone Encounter (Signed)
 No answer at 3:04 09/27/2023

## 2023-09-27 NOTE — Telephone Encounter (Signed)
 Patient needs to speak with someone about her legs getting weaker and weaker while waiting on approval for medication Mavenclad

## 2023-09-28 NOTE — Telephone Encounter (Signed)
 I advised to go to ER if needed, she thanked me for caling.

## 2023-09-29 ENCOUNTER — Telehealth: Payer: Self-pay

## 2023-09-29 NOTE — Telephone Encounter (Signed)
 Toilet seat did not work for her, called the suppplier to get a replacement or alternative wants to donate the one received and will look for another one on [place]. Just an [place]

## 2023-09-29 NOTE — Telephone Encounter (Signed)
 Copied from CRM [id number]. Topic: Clinical - Medical Advice >> Sep 29, 2023  9:27 AM Fuller [place] P wrote: Reason for CRM: Pt called in about her prescription orders for the medical equipment. Pt was sent RAV toilet seat and is unable to use it. She says the toilet seat does not hold and she tried to return it and feels as if her only choice is to donate. Pt is unsure of what she should do. Pt would like for someone to call her, Pt callback [phone]

## 2023-10-01 ENCOUNTER — Telehealth: Payer: Self-pay | Admitting: Neurology

## 2023-10-01 NOTE — Telephone Encounter (Addendum)
 1. Which medications need to be refilled? (please list name of each medication and dose if known) Cladribine, 10 Tabs, (MAVENCLAD, 10 TABS,) 10 MG TBPK  2. Which pharmacy/location (including street and city if local pharmacy) is medication to be sent to?   3. Do they need a 30 day or 90 day supply?   Left message for medication. Did not leave pharmacy name

## 2023-10-01 NOTE — Telephone Encounter (Signed)
 1. (MAVENCLAD, 10 TABS,)  2.[facility] Fax:[phone] Contact#-[phone] 3.Your discretion

## 2023-10-04 ENCOUNTER — Telehealth: Payer: Self-pay | Admitting: Neurology

## 2023-10-04 MED ORDER — MAVENCLAD (10 TABS) 10 MG PO TBPK: ORAL_TABLET | ORAL | 0 refills | Status: DC

## 2023-10-04 MED ORDER — CLADRIBINE (6 TABS) 10 MG PO TBPK: ORAL_TABLET | ORAL | 0 refills | Status: DC

## 2023-10-04 NOTE — Telephone Encounter (Signed)
 LVM for patient to call the office back.

## 2023-10-04 NOTE — Telephone Encounter (Signed)
 Weighs 135-137 lbs.  Cladribine 10mg  tablet.  Take 1.5 tablets daily for 4 days, then 23-27 days later, take 1.5 tablets daily for 4 days.  Quantity 12 tablets, refills 0

## 2023-10-04 NOTE — Telephone Encounter (Signed)
 Caller stated her legs are getting weaker and weaker and would like to speak with Dr Everlena Cooper about treatment.

## 2023-10-04 NOTE — Telephone Encounter (Signed)
 Pt called in again about her leg. She says it's really a problem. She knows Dr. Everlena Cooper doesn't like to put her on steroids, but she thinks she might need it.

## 2023-10-04 NOTE — Addendum Note (Signed)
 Addended by: Leida Lauth on: 10/04/2023 04:26 PM   Modules accepted: Orders

## 2023-10-05 NOTE — Telephone Encounter (Signed)
 Per patient she is in PT at home once a week.   Per patient the PT is not helping in the last week. Today it's ok. Hard for her to stand up and do anything.

## 2023-10-05 NOTE — Telephone Encounter (Signed)
 Pt. Called back after hours,having issues with her leg, standing, bending right leg, issue transitionoing to wheel chair and would like steroids [phone]

## 2023-10-05 NOTE — Telephone Encounter (Signed)
 First needs to be checked for UTI.  If negative, then we can start steroids.   Patient advised of note above.

## 2023-10-06 ENCOUNTER — Telehealth: Payer: Self-pay

## 2023-10-06 ENCOUNTER — Other Ambulatory Visit: Payer: Self-pay | Admitting: Internal Medicine

## 2023-10-06 DIAGNOSIS — R350 Frequency of micturition: Secondary | ICD-10-CM

## 2023-10-06 NOTE — Telephone Encounter (Signed)
 Copied from CRM [id number]. Topic: Clinical - Request for Lab/Test Order >> Oct 06, 2023  9:47 AM Everette C wrote: Reason for CRM: The patient has been directed by their neurologist to contact their PCP and request orders for UTI testing   Please contact the patient further when possible

## 2023-10-06 NOTE — Telephone Encounter (Unsigned)
 Copied from CRM [id number]. Topic: Clinical - Request for Lab/Test Order >> Oct 06, 2023  9:47 AM Everette C wrote: Reason for CRM: The patient has been directed by their neurologist to contact their PCP and request orders for UTI testing   Please contact the patient further when possible

## 2023-10-06 NOTE — Telephone Encounter (Signed)
 Copied from CRM [id number]. Topic: Clinical - Request for Lab/Test Order >> Oct 05, 2023  1:23 PM Pamela Roman wrote: Reason for CRM: Patient called in stating, Weakness in right leg, having problem bending leg, Patient spoke with neurologist Pamela Roman today, who stated patient needs to have a UTI test   patient has to get rides from family to appts.  Patient would like a call back to schedule appt phone # [phone]  ok to leave detailed message

## 2023-10-06 NOTE — Telephone Encounter (Signed)
 Patient advised.

## 2023-10-07 ENCOUNTER — Telehealth: Payer: Self-pay | Admitting: Neurology

## 2023-10-07 NOTE — Telephone Encounter (Signed)
 Caller is calling about an RX update request fro [facility]

## 2023-10-07 NOTE — Telephone Encounter (Signed)
 Per rep everything looks ready to go. The medication is  in the process phase.

## 2023-10-08 NOTE — Telephone Encounter (Signed)
 Spoke to the pharmacist Mavenclad standard for the patient weight per their guidelines is 136lbs should be day 1 is tabs and 1 tablet for the next 4 days.    I reviewed the guidelines given by rep and it does say the above dosing vs the 1.5 tabs Dr.Jaffe sent in. Patient is in year two of Mavenclad.    Please advise okay to send the guidelines dosing of 10 mg take two tabs on day and 1 tab for the remaining four days. And repeat in month 2.

## 2023-10-11 ENCOUNTER — Telehealth: Payer: Self-pay | Admitting: Neurology

## 2023-10-11 NOTE — Telephone Encounter (Signed)
 [facility] would like to continue treatment 1 time a week for 8 weeks, she had a fall on 10/01/23 and 10/07/23. She could not get up for two hours and had to wait for neighbors to assist, Pt. Did not hit head has pain in both legs.

## 2023-10-14 NOTE — Telephone Encounter (Signed)
 Letter received from [facility] Patient approved for [employer] assistance program. 09/24/23-07/05/24.

## 2023-10-14 NOTE — Telephone Encounter (Signed)
 PCP will call patient to go over the results.

## 2023-10-14 NOTE — Telephone Encounter (Signed)
 Pt called in and left a message. She stated she had her UTI test done and the results were abnormal. She was hoping Dr. Everlena Cooper could see the results.

## 2023-10-15 ENCOUNTER — Telehealth (INDEPENDENT_AMBULATORY_CARE_PROVIDER_SITE_OTHER): Admitting: Internal Medicine

## 2023-10-15 ENCOUNTER — Other Ambulatory Visit: Payer: Self-pay | Admitting: Internal Medicine

## 2023-10-15 DIAGNOSIS — N3000 Acute cystitis without hematuria: Secondary | ICD-10-CM

## 2023-10-15 LAB — UA/M W/RFLX CULTURE, ROUTINE
Bilirubin, UA: NEGATIVE
Glucose, UA: NEGATIVE
Ketones, UA: NEGATIVE
Nitrite, UA: NEGATIVE
Protein,UA: NEGATIVE
RBC, UA: NEGATIVE
Specific Gravity, UA: 1.021 (ref 1.005–1.030)
Urobilinogen, Ur: 0.2 mg/dL (ref 0.2–1.0)
pH, UA: 7 (ref 5.0–7.5)

## 2023-10-15 LAB — MICROSCOPIC EXAMINATION
Bacteria, UA: NONE SEEN
Casts: NONE SEEN /LPF
RBC, Urine: NONE SEEN /HPF (ref 0–2)

## 2023-10-15 LAB — URINE CULTURE, REFLEX

## 2023-10-15 MED ORDER — NITROFURANTOIN MONOHYD MACRO 100 MG PO CAPS: 100.0000 mg | ORAL_CAPSULE | Freq: Two times a day (BID) | ORAL | 0 refills | Status: DC

## 2023-10-15 NOTE — Progress Notes (Signed)
 Sent Macrobid after checking UA. Awaiting urine culture. Spoke to the patient.

## 2023-10-15 NOTE — Telephone Encounter (Signed)
 Sent Macrobid after checking UA. Awaiting urine culture. Spoke to the patient.

## 2023-10-19 ENCOUNTER — Telehealth: Payer: Self-pay | Admitting: Internal Medicine

## 2023-10-19 ENCOUNTER — Telehealth: Payer: Self-pay | Admitting: Neurology

## 2023-10-19 DIAGNOSIS — G35 Multiple sclerosis: Secondary | ICD-10-CM

## 2023-10-19 NOTE — Telephone Encounter (Signed)
 Pt called in wanting to give us  an update. She stated she finished the first month of the second year of her mavenclad. She feels the worst she has in a while. Her legs are really weak. She still is taking her UTI drugs until tomorrow. She is hoping that might be the issue. She just wants to let our office know how she is feeling.

## 2023-10-19 NOTE — Telephone Encounter (Signed)
 Copied from CRM [id number]. Topic: Clinical - Home Health Verbal Orders >> Oct 19, 2023  8:34 AM Lizabeth Riggs wrote: Caller/Agency: [facility] Callback Number: [phone] Service Requested: Occupational Therapy Frequency: 1 time weekly for 8 weeks Any new concerns about the patient? No

## 2023-10-19 NOTE — Telephone Encounter (Signed)
 Spoke with [facility] to approve orders

## 2023-10-19 NOTE — Telephone Encounter (Signed)
 Spoke to patient, Today she is okay but she is still weak. Legs are weak unable to get around.  She has fallen five times. She has a lift machine coming in to her home.   On 10/17/23 she had fallen and was scared to call the EMT to help her up so she laid in the floor for three hours. She eventually called her caretaker on the phone to come help her off the floor.   Caretaker comes in the home three times a week. And she is on her own for the rest of the week.    Please advise.

## 2023-10-20 ENCOUNTER — Telehealth: Payer: Self-pay | Admitting: Neurology

## 2023-10-20 NOTE — Telephone Encounter (Signed)
 [facility] called in to alert Dr. Festus Hubert that the pt fell last Thrusday and Sunday.

## 2023-10-28 NOTE — Telephone Encounter (Signed)
 Patient advised of Dr.Jaffe note,  Check MRI of brain/cervical/thoracic spine with and without contrast.    Per patient she agree with the order but wanted to make sure DR.Festus Hubert  wants to still do them, Last one done 07/2023.

## 2023-10-29 ENCOUNTER — Other Ambulatory Visit: Payer: Self-pay | Admitting: Neurology

## 2023-10-29 MED ORDER — DIAZEPAM 5 MG PO TABS: ORAL_TABLET | ORAL | 0 refills | Status: DC

## 2023-10-29 NOTE — Telephone Encounter (Signed)
 LMOVM for patient, Diazepam  sent to [facility]

## 2023-10-29 NOTE — Telephone Encounter (Addendum)
 Patient advised MRI's ordered and scheduled for 10/31/23 at 1:45. Patient to arrive at [facility] at 1 pm.    Per patient, Wanted to see if Dr. Festus Hubert would give her something to help with her claustrophobia.

## 2023-10-31 ENCOUNTER — Ambulatory Visit (HOSPITAL_COMMUNITY)
Admission: RE | Admit: 2023-10-31 | Discharge: 2023-10-31 | Disposition: A | Discharge status: Home / Self Care | Source: Ambulatory Visit | Attending: Neurology | Admitting: Neurology

## 2023-10-31 DIAGNOSIS — G35 Multiple sclerosis: Secondary | ICD-10-CM

## 2023-10-31 MED ORDER — GADOBUTROL 1 MMOL/ML IV SOLN
6.0000 mL | Freq: Once | INTRAVENOUS | Status: AC | PRN
  Administered 2023-10-31: 6 mL via INTRAVENOUS

## 2023-11-01 NOTE — Progress Notes (Signed)
 Patient advised.

## 2023-11-02 ENCOUNTER — Telehealth: Payer: Self-pay | Admitting: Neurology

## 2023-11-02 NOTE — Telephone Encounter (Signed)
 Left message with after hour service on 11-02-23  Patient wants to know if the MRI results have been looked over and to get the results

## 2023-11-02 NOTE — Telephone Encounter (Signed)
 Patient advised of MRI results 11/01/23.

## 2023-11-05 ENCOUNTER — Telehealth: Payer: Self-pay

## 2023-11-05 NOTE — Telephone Encounter (Signed)
 Note patient missed appt wth PT.

## 2023-11-08 ENCOUNTER — Telehealth: Payer: Self-pay

## 2023-11-08 NOTE — Progress Notes (Signed)
 Tried calling patient no answer. Vm full, unable to LVM.

## 2023-11-08 NOTE — Telephone Encounter (Signed)
 ERROR

## 2023-11-18 ENCOUNTER — Telehealth: Payer: Self-pay | Admitting: Neurology

## 2023-11-18 NOTE — Telephone Encounter (Signed)
 Per last note from PT patient missed her appt for PT.  PER MRI results, Incidental finding was noted on the MRI of the thoracic spine.  There is a spot seen on one of the vertebral bodies (bone in the spine).  It is not indicative of anything specific, but it is recommended to repeat MRI of the thoracic spine with and without contrast in 3 months to make sure there aren't further changes.

## 2023-11-18 NOTE — Telephone Encounter (Signed)
 Telephone call from patient OT, Pamela Roman.   Advised of Fallen yesterday.  The mobility in the patient's right leg has worsen. Erline Hawthorn had a conversation with the patient on Conserve her energy, try not to do so much. Patient goes to the [employer] as well on a couple of days out the week.  Mrs. Pamela Roman also discussed with the patient the importance of Exercising on her own at home and gave her some exercises to work on.

## 2023-11-18 NOTE — Telephone Encounter (Signed)
 Patient called and left a VM stating that she is having a problem with her Balance and is having a hard time standing up well. She has finished ( could not understand the name of the medication). She needs to know the next step   Please call

## 2023-11-19 NOTE — Telephone Encounter (Signed)
 Pt calling again need respond please, issues getting worse

## 2023-11-19 NOTE — Telephone Encounter (Signed)
 PER patient when she woke up her legs felt dead, unable to move them at all.  Advised patient to continue her PT/OT as instructed and the At home Excesses

## 2023-11-22 ENCOUNTER — Telehealth: Payer: Self-pay | Admitting: Neurology

## 2023-11-22 NOTE — Telephone Encounter (Signed)
 Physical therapist called in stating the pt reported falling 11/21/23. She fell when trying to get from her couch to her wheelchair. She did not report any pain or injury. The pt is walking less than when she started seeing her about 3 weeks ago. She can walk about 6 feet right now.

## 2023-11-29 ENCOUNTER — Other Ambulatory Visit: Payer: Self-pay | Admitting: Internal Medicine

## 2023-12-02 ENCOUNTER — Telehealth: Payer: Self-pay | Admitting: Neurology

## 2023-12-02 NOTE — Telephone Encounter (Signed)
 Pt. Would like refill of Carvidopa levidopa with home delivery service please, call once filled

## 2023-12-02 NOTE — Telephone Encounter (Signed)
 Per patient she had some back pain and some sleep issues and was wondering if she could get Gabapentin  refilled.

## 2023-12-03 ENCOUNTER — Other Ambulatory Visit: Payer: Self-pay | Admitting: Neurology

## 2023-12-03 MED ORDER — GABAPENTIN 100 MG PO CAPS: 100.0000 mg | ORAL_CAPSULE | Freq: Three times a day (TID) | ORAL | 1 refills | Status: DC

## 2023-12-03 NOTE — Telephone Encounter (Signed)
 LMOVM per Dr.Jaffe,  She was previously on gabapentin  100mg  three times daily.  I sent prescription (90 day supply) to [facility]

## 2023-12-14 ENCOUNTER — Ambulatory Visit: Payer: Medicare Other

## 2023-12-14 VITALS — Ht 63.0 in | Wt 140.0 lb

## 2023-12-14 DIAGNOSIS — Z0000 Encounter for general adult medical examination without abnormal findings: Secondary | ICD-10-CM | POA: Diagnosis not present

## 2023-12-14 DIAGNOSIS — Z780 Asymptomatic menopausal state: Secondary | ICD-10-CM | POA: Diagnosis not present

## 2023-12-14 DIAGNOSIS — Z1231 Encounter for screening mammogram for malignant neoplasm of breast: Secondary | ICD-10-CM

## 2023-12-14 NOTE — Progress Notes (Signed)
 Subjective:   Pamela Roman is a 68 y.o. who presents for a Medicare Wellness preventive visit.  As a reminder, Annual Wellness Visits don't include a physical exam, and some assessments may be limited, especially if this visit is performed virtually. We may recommend an in-person follow-up visit with your provider if needed.  Visit Complete: Virtual I connected with  Pamela Roman on 12/14/23 by a video and audio enabled telemedicine application and verified that I am speaking with the correct person using two identifiers.  Patient Location: Home  Provider Location: Home Office  I discussed the limitations of evaluation and management by telemedicine. The patient expressed understanding and agreed to proceed.  Vital Signs: Because this visit was a virtual/telehealth visit, some criteria may be missing or patient reported. Any vitals not documented were not able to be obtained and vitals that have been documented are patient reported.  Persons Participating in Visit: Patient.  AWV Questionnaire: Yes: Patient Medicare AWV questionnaire was completed by the patient on 12/13/2023; I have confirmed that all information answered by patient is correct and no changes since this date.  Cardiac Risk Factors include: advanced age (>44men, >39 women);dyslipidemia;sedentary lifestyle     Objective:     Today's Vitals   12/14/23 0843  Weight: 140 lb (63.5 kg)  Height: 5\' 3"  (1.6 m)   Body mass index is 24.8 kg/m.     12/14/2023    9:04 AM 07/22/2023   10:25 AM 07/12/2023    8:10 AM 01/18/2023   10:01 AM 12/09/2022    7:35 PM 12/09/2022   12:26 PM 07/20/2022    9:33 AM  Advanced Directives  Does Patient Have a Medical Advance Directive? No No No No No No No  Would patient like information on creating a medical advance directive? Yes (MAU/Ambulatory/Procedural Areas - Information given) No - Patient declined No - Patient declined   No - Patient declined     Current Medications  (verified) Outpatient Encounter Medications as of 12/14/2023  Medication Sig   acetaminophen  (TYLENOL ) 325 MG tablet Take 2 tablets (650 mg total) by mouth every 6 (six) hours as needed for mild pain (pain score 1-3), fever or headache.   albuterol  (PROVENTIL ) (2.5 MG/3ML) 0.083% nebulizer solution Take 3 mLs (2.5 mg total) by nebulization every 4 (four) hours as needed for wheezing or shortness of breath.   alendronate  (FOSAMAX ) 70 MG tablet TAKE 1 TABLET ONCE A WEEK WITH A FULL GLASS OF WATER ON AN EMPTY STOMACH   BETA CAROTENE PO Take 1 capsule by mouth in the morning. Vitamin A, unknown strength.   Cholecalciferol  (VITAMIN D3) 125 MCG (5000 UT) CAPS Take 1 capsule (5,000 Units total) by mouth daily.   Cladribine , 6 Tabs, 10 MG TBPK Take 1.5 tablets daily for 4 days, then 23-27 days later, take 1.5 tablets daily for 4 days.   diazepam  (VALIUM ) 5 MG tablet TAKE 1 TABLET BY MOUTH 30 TO 40 MINUTES BEFORE MRI   gabapentin  (NEURONTIN ) 100 MG capsule Take 1 capsule (100 mg total) by mouth 3 (three) times daily.   levothyroxine  (SYNTHROID ) 50 MCG tablet TAKE 1 TABLET DAILY BEFORE BREAKFAST   Multiple Vitamins-Minerals (ONE-A-DAY WOMENS 50 PLUS) TABS Take 1 tablet by mouth in the morning.   nitrofurantoin , macrocrystal-monohydrate, (MACROBID ) 100 MG capsule Take 1 capsule (100 mg total) by mouth 2 (two) times daily.   pantoprazole  (PROTONIX ) 40 MG tablet Take 1 tablet (40 mg total) by mouth daily.   sertraline  (ZOLOFT ) 25  MG tablet Take 1 tablet (25 mg total) by mouth daily.   simvastatin  (ZOCOR ) 20 MG tablet TAKE 1 TABLET DAILY   SUMAtriptan  (IMITREX ) 100 MG tablet TAKE 1 TABLET EVERY 2 HOURS AS NEEDED FOR MIGRAINE. MAY REPEAT IN 2 HOURS IF HEADACHE PERSISTS OR RECURS (Patient taking differently: Take 100 mg by mouth See admin instructions. 100 mg at the onset of migraine. May repeat 100 mg in 2 hours if headache persists or recurs.)   zonisamide  (ZONEGRAN ) 100 MG capsule Take 100 mg by mouth at  bedtime.   dextromethorphan  (DELSYM ) 30 MG/5ML liquid Take 5 mLs (30 mg total) by mouth 2 (two) times daily as needed for cough.   No facility-administered encounter medications on file as of 12/14/2023.    Allergies (verified) Codeine   History: Past Medical History:  Diagnosis Date   Anxiety    Depression    Phreesia 08/06/2020   Hx of migraines    Hyperglycemia    Hypothyroidism    Migraines    Multiple sclerosis (HCC)    Neuromuscular disorder [facility])    Past Surgical History:  Procedure Laterality Date   CATARACT EXTRACTION W/PHACO Left 01/01/2020   Procedure: CATARACT EXTRACTION PHACO AND INTRAOCULAR LENS PLACEMENT LEFT EYE;  Surgeon: Tarri Farm, MD;  Location: [facility];  Service: Ophthalmology;  Laterality: Left;  CDE: 3.37   CATARACT EXTRACTION W/PHACO Right 01/19/2020   Procedure: CATARACT EXTRACTION PHACO AND INTRAOCULAR LENS PLACEMENT RIGHT EYE;  Surgeon: Tarri Farm, MD;  Location: [facility];  Service: Ophthalmology;  Laterality: Right;  CDE: 3.16   TONSILLECTOMY     Family History  Problem Relation Age of Onset   Dementia Mother    Dementia Father    Social History   Socioeconomic History   Marital status: Widowed    Spouse name: Not on file   Number of children: 0   Years of education: 32   Highest education level: Master's degree (e.g., MA, MS, MEng, MEd, MSW, MBA)  Occupational History   Not on file  Tobacco Use   Smoking status: Never    Passive exposure: Never   Smokeless tobacco: Never   Tobacco comments:    Lived with smoker  Vaping Use   Vaping status: Never Used  Substance and Sexual Activity   Alcohol use: Not Currently   Drug use: Never   Sexual activity: Not Currently    Partners: Male  Other Topics Concern   Not on file  Social History Narrative   ** Merged History Encounter ** Lives with her sister two story home stays on the first floor   Left handed   Caffeine none   Social Drivers of Health   Financial Resource Strain:  Low Risk  (12/14/2023)   Overall Financial Resource Strain (CARDIA)    Difficulty of Paying Living Expenses: Not hard at all  Food Insecurity: No Food Insecurity (12/14/2023)   Hunger Vital Sign    Worried About Running Out of Food in the Last Year: Never true    Ran Out of Food in the Last Year: Never true  Transportation Needs: No Transportation Needs (12/14/2023)   PRAPARE - Administrator, Civil Service (Medical): No    Lack of Transportation (Non-Medical): No  Physical Activity: Inactive (12/14/2023)   Exercise Vital Sign    Days of Exercise per Week: 0 days    Minutes of Exercise per Session: 0 min  Stress: No Stress Concern Present (12/14/2023)   [employer] of Occupational Health -  Occupational Stress Questionnaire    Feeling of Stress : Not at all  Social Connections: Moderately Isolated (12/14/2023)   Social Connection and Isolation Panel [NHANES]    Frequency of Communication with Friends and Family: More than three times a week    Frequency of Social Gatherings with Friends and Family: Once a week    Attends Religious Services: More than 4 times per year    Active Member of [employer] or Organizations: No    Attends Banker Meetings: Never    Marital Status: Widowed    Tobacco Counseling Counseling given: Yes Tobacco comments: Lived with smoker    Clinical Intake:  Pre-visit preparation completed: Yes  Pain : No/denies pain     BMI - recorded: 24.8 Nutritional Status: BMI of 19-24  Normal Nutritional Risks: None Diabetes: No  Lab Results  Component Value Date   HGBA1C 5.7 (H) 07/16/2022     How often do you need to have someone help you when you read instructions, pamphlets, or other written materials from your doctor or pharmacy?: 1 - Never  Interpreter Needed?: No  Information entered by :: Aneya Crazier CMA   Activities of Daily Living     12/13/2023    9:24 AM 07/12/2023    6:16 PM  In your present state of health, do you have  any difficulty performing the following activities:  Hearing? 1 0  Vision? 1 0  Difficulty concentrating or making decisions? 1 0  Walking or climbing stairs? 1   Dressing or bathing? 1   Doing errands, shopping? 1 0  Preparing Food and eating ? N   Using the Toilet? N   In the past six months, have you accidently leaked urine? Y   Do you have problems with loss of bowel control? Y   Managing your Medications? N   Managing your Finances? Y   Housekeeping or managing your Housekeeping? Y     Patient Care Team: Meldon Sport, MD as PCP - General (Internal Medicine) [facility] [place] (Ophthalmology) Merriam Abbey, DO as Consulting Physician (Neurology) Yves Herb, MD as Consulting Physician (Psychiatry)  I have updated your Care Teams any recent Medical Services you may have received from other providers in the past year.     Assessment:    This is a routine wellness examination for Mi.  Hearing/Vision screen Hearing Screening - Comments:: Patient has hearing difficulties and has had testing. Had hearing aids, but she states they didn't help. OTC hearing aids seem to help just as well or better than the prescription ones  Vision Screening - Comments:: Patient wears reading glasses only. Up to date with yearly exams.  Sees [facility]   Goals Addressed             This Visit's Progress    Patient Stated   On track    Patient states her goal is to reduce the number of falls she has.       Prevent falls   On track      Depression Screen     12/14/2023    8:53 AM 08/19/2023    2:37 PM 12/09/2022   12:23 PM 10/05/2022    1:35 PM 07/16/2022   10:54 AM 06/19/2022    1:18 PM 04/01/2022    1:07 PM  PHQ 2/9 Scores  PHQ - 2 Score 2 0 0 0 0 0 4  PHQ- 9 Score 8 0  0  0 10    Fall Risk     12/13/2023    9:24 AM 08/19/2023    2:37 PM 07/22/2023   10:25 AM 01/18/2023   10:01 AM 12/09/2022   12:26 PM  Fall Risk   Falls in the past year? 1 0  1 1 1   Number falls in past yr: 1 0 1 0 1  Injury with Fall? 0 0 1 1 1   Risk for fall due to : Impaired mobility;Impaired vision;History of fall(s);Impaired balance/gait No Fall Risks   History of fall(s);Impaired balance/gait;Impaired mobility;Other (Comment)  Risk for fall due to: Comment     patient has MS  Follow up Falls evaluation completed;Education provided;Falls prevention discussed Falls evaluation completed Falls evaluation completed Falls evaluation completed Education provided;Falls prevention discussed    MEDICARE RISK AT HOME:  Medicare Risk at Home Any stairs in or around the home?: (Patient-Rptd) Yes If so, are there any without handrails?: (Patient-Rptd) No Home free of loose throw rugs in walkways, pet beds, electrical cords, etc?: (Patient-Rptd) Yes Adequate lighting in your home to reduce risk of falls?: (Patient-Rptd) Yes Life alert?: (Patient-Rptd) Yes Use of a cane, walker or w/c?: (Patient-Rptd) Yes Grab bars in the bathroom?: (Patient-Rptd) Yes Shower chair or bench in shower?: (Patient-Rptd) Yes Elevated toilet seat or a handicapped toilet?: (Patient-Rptd) Yes  TIMED UP AND GO:  Was the test performed?  No  Cognitive Function: 6CIT completed    03/12/2021   10:46 AM  MMSE - Mini Mental State Exam  Orientation to time 5  Orientation to Place 5  Registration 3  Attention/ Calculation 5  Recall 3  Language- name 2 objects 2  Language- repeat 1  Language- follow 3 step command 3  Language- read & follow direction 1  Write a sentence 1  Copy design 1  Total score 30        12/14/2023    9:02 AM 12/09/2022   12:30 PM 11/24/2021    8:10 AM 11/18/2020    9:49 AM  6CIT Screen  What Year? 0 points 0 points 0 points 0 points  What month? 0 points 0 points 0 points 0 points  What time? 0 points 0 points 0 points 0 points  Count back from 20 0 points 0 points 0 points 0 points  Months in reverse 0 points 0 points 0 points 0 points  Repeat phrase 0 points  0 points 0 points 0 points  Total Score 0 points 0 points 0 points 0 points    Immunizations Immunization History  Administered Date(s) Administered   Moderna Sars-Covid-2 Vaccination 09/19/2019, 10/17/2019, 05/01/2020   PNEUMOCOCCAL CONJUGATE-20 07/16/2021    Screening Tests Health Maintenance  Topic Date Due   DTaP/Tdap/Td (1 - Tdap) Never done   Zoster Vaccines- Shingrix (1 of 2) Never done   COVID-19 Vaccine (4 - 2024-25 season) 03/07/2023   DEXA SCAN  12/30/2023   INFLUENZA VACCINE  02/04/2024   MAMMOGRAM  02/22/2024   Fecal DNA (Cologuard)  08/01/2024   Medicare Annual Wellness (AWV)  12/13/2024   Pneumonia Vaccine 24+ Years old  Completed   Hepatitis C Screening  Completed   HPV VACCINES  Aged Out   Meningococcal B Vaccine  Aged Out    Health Maintenance  Health Maintenance Due  Topic Date Due   DTaP/Tdap/Td (1 - Tdap) Never done   Zoster Vaccines- Shingrix (1 of 2) Never done   COVID-19 Vaccine (4 - 2024-25 season) 03/07/2023   Health Maintenance Items  Addressed: Mammogram ordered, DEXA ordered  Additional Screening:  Vision Screening: Recommended annual ophthalmology exams for early detection of glaucoma and other disorders of the eye. Would you like a referral to an eye doctor? No    Dental Screening: Recommended annual dental exams for proper oral hygiene  Community Resource Referral / Chronic Care Management: CRR required this visit?  No   CCM required this visit?  No   Plan:    I have personally reviewed and noted the following in the patient's chart:   Medical and social history Use of alcohol, tobacco or illicit drugs  Current medications and supplements including opioid prescriptions. Patient is not currently taking opioid prescriptions. Functional ability and status Nutritional status Physical activity Advanced directives List of other physicians Hospitalizations, surgeries, and ER visits in previous 12 months Vitals Screenings to  include cognitive, depression, and falls Referrals and appointments  In addition, I have reviewed and discussed with patient certain preventive protocols, quality metrics, and best practice recommendations. A written personalized care plan for preventive services as well as general preventive health recommendations were provided to patient.   Ledia Hanford, CMA   12/14/2023   After Visit Summary: (MyChart) Due to this being a telephonic visit, the after visit summary with patients personalized plan was offered to patient via MyChart   Notes: Please refer to Routing Comments.

## 2023-12-14 NOTE — Patient Instructions (Signed)
 Pamela Roman , Thank you for taking time out of your busy schedule to complete your Annual Wellness Visit with me. I enjoyed our conversation and look forward to speaking with you again next year. I, as well as your care team,  appreciate your ongoing commitment to your health goals. Please review the following plan we discussed and let me know if I can assist you in the future. Your Game plan/ To Do List    Referrals Placed:  Osteoporosis Screening Please call the number below to schedule your appt. [facility] [address] Pamela Roman Phone: [phone] Make sure to wear two-piece clothing.  If you're having a mammogram, please remember: No lotions,powders, or deodorants the day of the appointment Make sure to bring picture ID and insurance card.  Bring list of medications you are currently taking including any supplements.  If you are having a bone density scan, please discontinue any medications that contain calcium at least 48 hours (2 days) prior to your scan.   Yearly Mammogram Pamela Roman Please call the number below to schedule your appt. [facility] at [facility] Phone: [phone] Make sure to wear two-piece clothing.  Please remember: No lotions,powders, or deodorants the day of the appointment Make sure to bring picture ID and insurance card.  Bring list of medications you are currently taking including any supplements.   Labs ordered for you: none Follow up Visits: Next Medicare AWV with clinical staff:December 18, 2024 at 8am video Have you seen your provider in the last 6 months (3 months if uncontrolled diabetes)? Yes Next Office Visit with your Primary Care Doctor: January 11, 2024 video visit Clinician Recommendations:   Aim for 30 minutes of exercise or brisk walking, 6-8 glasses of water, and 5 servings of fruits and vegetables each day.  I enjoyed our conversation today and look forward to talking with you again next year!! Have a wonderful and safe year.  All  the best, Pamela Roman  This is a list of the screening recommended for you and due dates:  Health Maintenance  Topic Date Due   DTaP/Tdap/Td vaccine (1 - Tdap) Never Roman   Zoster (Shingles) Vaccine (1 of 2) Never Roman   COVID-19 Vaccine (4 - 2024-25 season) 03/07/2023   DEXA scan (bone density measurement)  12/30/2023   Flu Shot  02/04/2024   Mammogram  02/22/2024   Cologuard (Stool DNA test)  08/01/2024   Medicare Annual Wellness Visit  12/13/2024   Pneumonia Vaccine  Completed   Hepatitis C Screening  Completed   HPV Vaccine  Aged Out   Meningitis B Vaccine  Aged Out    Advanced directives: (Provided) Advance directive discussed with you today. I have provided a copy for you to complete at home and have notarized. Once this is complete, please bring a copy in to our office so we can scan it into your chart.  Advance Care Planning is important because it:  [x]  Makes sure you receive the medical care that is consistent with your values, goals, and preferences  [x]  It provides guidance to your family and loved ones and reduces their decisional burden about whether or not they are making the right decisions based on your wishes.  Follow the link provided in your after visit summary or read over the paperwork we have mailed to you to help you started getting your Advance Directives in place. If you need assistance in completing these, please reach out to us  so that we can  help you!  See attachments for Preventive Care and Fall Prevention Tips.  Understanding Your Risk for Falls Millions of people have serious injuries from falls each year. It is important to understand your risk of falling. Talk with your health care provider about your risk and what you can do to lower it. If you do have a serious fall, make sure to tell your provider. Falling once raises your risk of falling again. How can falls affect me? Serious injuries from falls are common. These include: Broken bones, such as hip  fractures. Head injuries, such as traumatic brain injuries (TBI) or concussions. A fear of falling can cause you to avoid activities and stay at home. This can make your muscles weaker and raise your risk for a fall. What can increase my risk? There are a number of risk factors that increase your risk for falling. The more risk factors you have, the higher your risk of falling. Serious injuries from a fall happen most often to people who are older than 68 years old. Teenagers and young adults ages 25-29 are also at higher risk. Common risk factors include: Weakness in the lower body. Being generally weak or confused due to long-term (chronic) illness. Dizziness or balance problems. Poor vision. Medicines that cause dizziness or drowsiness. These may include: Medicines for your blood pressure, heart, anxiety, insomnia, or swelling (edema). Pain medicines. Muscle relaxants. Other risk factors include: Drinking alcohol. Having had a fall in the past. Having foot pain or wearing improper footwear. Working at a dangerous job. Having any of the following in your home: Tripping hazards, such as floor clutter or loose rugs. Poor lighting. Pets. Having dementia or memory loss. What actions can I take to lower my risk of falling?     Physical activity Stay physically fit. Do strength and balance exercises. Consider taking a regular class to build strength and balance. Yoga and tai chi are good options. Vision Have your eyes checked every year and your prescription for glasses or contacts updated as needed. Shoes and walking aids Wear non-skid shoes. Wear shoes that have rubber soles and low heels. Do not wear high heels. Do not walk around the house in socks or slippers. Use a cane or walker as told by your provider. Home safety Attach secure railings on both sides of your stairs. Install grab bars for your bathtub, shower, and toilet. Use a non-skid mat in your bathtub or shower.  Attach bath mats securely with double-sided, non-slip rug tape. Use good lighting in all rooms. Keep a flashlight near your bed. Make sure there is a clear path from your bed to the bathroom. Use night-lights. Do not use throw rugs. Make sure all carpeting is taped or tacked down securely. Remove all clutter from walkways and stairways, including extension cords. Repair uneven or broken steps and floors. Avoid walking on icy or slippery surfaces. Walk on the grass instead of on icy or slick sidewalks. Use ice melter to get rid of ice on walkways in the winter. Use a cordless phone. Questions to ask your health care provider Can you help me check my risk for a fall? Do any of my medicines make me more likely to fall? Should I take a vitamin D  supplement? What exercises can I do to improve my strength and balance? Should I make an appointment to have my vision checked? Do I need a bone density test to check for weak bones (osteoporosis)? Would it help to use a cane or a  walker? Where to find more information Centers for Disease Control and Prevention, STEADI: TonerPromos.no Community-Based Fall Prevention Programs: TonerPromos.no [employer] on Aging: BaseRingTones.pl Contact a health care provider if: You fall at home. You are afraid of falling at home. You feel weak, drowsy, or dizzy. This information is not intended to replace advice given to you by your health care provider. Make sure you discuss any questions you have with your health care provider. Document Revised: 02/23/2022 Document Reviewed: 02/23/2022 Elsevier Patient Education  2024 [employer].  Understanding Your Risk for Falls Millions of people have serious injuries from falls each year. It is important to understand your risk of falling. Talk with your health care provider about your risk and what you can do to lower it. If you do have a serious fall, make sure to tell your provider. Falling once raises your risk of falling  again. How can falls affect me? Serious injuries from falls are common. These include: Broken bones, such as hip fractures. Head injuries, such as traumatic brain injuries (TBI) or concussions. A fear of falling can cause you to avoid activities and stay at home. This can make your muscles weaker and raise your risk for a fall. What can increase my risk? There are a number of risk factors that increase your risk for falling. The more risk factors you have, the higher your risk of falling. Serious injuries from a fall happen most often to people who are older than 68 years old. Teenagers and young adults ages 55-29 are also at higher risk. Common risk factors include: Weakness in the lower body. Being generally weak or confused due to long-term (chronic) illness. Dizziness or balance problems. Poor vision. Medicines that cause dizziness or drowsiness. These may include: Medicines for your blood pressure, heart, anxiety, insomnia, or swelling (edema). Pain medicines. Muscle relaxants. Other risk factors include: Drinking alcohol. Having had a fall in the past. Having foot pain or wearing improper footwear. Working at a dangerous job. Having any of the following in your home: Tripping hazards, such as floor clutter or loose rugs. Poor lighting. Pets. Having dementia or memory loss. What actions can I take to lower my risk of falling?     Physical activity Stay physically fit. Do strength and balance exercises. Consider taking a regular class to build strength and balance. Yoga and tai chi are good options. Vision Have your eyes checked every year and your prescription for glasses or contacts updated as needed. Shoes and walking aids Wear non-skid shoes. Wear shoes that have rubber soles and low heels. Do not wear high heels. Do not walk around the house in socks or slippers. Use a cane or walker as told by your provider. Home safety Attach secure railings on both sides of your  stairs. Install grab bars for your bathtub, shower, and toilet. Use a non-skid mat in your bathtub or shower. Attach bath mats securely with double-sided, non-slip rug tape. Use good lighting in all rooms. Keep a flashlight near your bed. Make sure there is a clear path from your bed to the bathroom. Use night-lights. Do not use throw rugs. Make sure all carpeting is taped or tacked down securely. Remove all clutter from walkways and stairways, including extension cords. Repair uneven or broken steps and floors. Avoid walking on icy or slippery surfaces. Walk on the grass instead of on icy or slick sidewalks. Use ice melter to get rid of ice on walkways in the winter. Use a cordless phone. Questions  to ask your health care provider Can you help me check my risk for a fall? Do any of my medicines make me more likely to fall? Should I take a vitamin D  supplement? What exercises can I do to improve my strength and balance? Should I make an appointment to have my vision checked? Do I need a bone density test to check for weak bones (osteoporosis)? Would it help to use a cane or a walker? Where to find more information Centers for Disease Control and Prevention, STEADI: TonerPromos.no Community-Based Fall Prevention Programs: TonerPromos.no [employer] on Aging: BaseRingTones.pl Contact a health care provider if: You fall at home. You are afraid of falling at home. You feel weak, drowsy, or dizzy. This information is not intended to replace advice given to you by your health care provider. Make sure you discuss any questions you have with your health care provider. Document Revised: 02/23/2022 Document Reviewed: 02/23/2022 Elsevier Patient Education  2024 [employer].

## 2023-12-20 ENCOUNTER — Telehealth: Payer: Self-pay | Admitting: Neurology

## 2023-12-20 DIAGNOSIS — G35 Multiple sclerosis: Secondary | ICD-10-CM

## 2023-12-20 NOTE — Telephone Encounter (Signed)
 Pt left a VM needing to speak to someone about her ear popping going in and out  for the last week

## 2023-12-21 NOTE — Telephone Encounter (Signed)
 Patient advised, It may be eustachian tube dysfunction. It is definitely not related to MS. Agree with following up with PCP     Patient wants to know if she could get a script for a chair lift.

## 2023-12-21 NOTE — Telephone Encounter (Signed)
 Per Dr.Jaffe, okay to write a script for floor lift.

## 2023-12-21 NOTE — Telephone Encounter (Signed)
 Patient complain of popping sensation and muffled sound in her Right ear. This has been going on for the last week or so off and on.   Advised patient we will send this message to [facility]. But also advised to call her PCP to advised of her complaint as well.

## 2023-12-22 ENCOUNTER — Ambulatory Visit
Admission: RE | Admit: 2023-12-22 | Discharge: 2023-12-22 | Disposition: A | Discharge status: Home / Self Care | Source: Ambulatory Visit | Attending: Family Medicine

## 2023-12-22 VITALS — BP 109/73 | HR 77 | Temp 97.7°F | Resp 16

## 2023-12-22 DIAGNOSIS — H6991 Unspecified Eustachian tube disorder, right ear: Secondary | ICD-10-CM

## 2023-12-22 MED ORDER — AZELASTINE HCL 0.1 % NA SOLN: 1.0000 | Freq: Two times a day (BID) | NASAL | 0 refills | Status: DC

## 2023-12-22 MED ORDER — DEXAMETHASONE SODIUM PHOSPHATE 10 MG/ML IJ SOLN
10.0000 mg | Freq: Once | INTRAMUSCULAR | Status: AC
  Administered 2023-12-22: 10 mg via INTRAMUSCULAR

## 2023-12-22 NOTE — ED Triage Notes (Addendum)
 Pt presents to UC for c/o right ear popping x1.5 weeks. Denies pain. Reports some dizziness.

## 2023-12-23 NOTE — ED Provider Notes (Signed)
 [facility]    CSN: [id number] Arrival date & time: 12/22/23  1233      History   Chief Complaint Chief Complaint  Patient presents with   Ear Injury    Right ear pops  repeatedly. Causing dizziness and balance issues. - Entered by patient    HPI Pamela Roman is a 68 y.o. female.   Pt presents to UC for c/o right ear popping x1.5 weeks. Denies pain. Reports some dizziness.     Past Medical History:  Diagnosis Date   Anxiety    Depression    Phreesia 08/06/2020   Hx of migraines    Hyperglycemia    Hypothyroidism    Migraines    Multiple sclerosis (HCC)    Neuromuscular disorder [facility])     Patient Active Problem List   Diagnosis Date Noted   Generalized weakness 07/12/2023   Altered mental status 07/12/2023   Fever, unspecified 07/12/2023   Hypertrophic toenail 10/05/2022   Seasonal allergic rhinitis 10/05/2022   Multiple sclerosis exacerbation (HCC) 06/15/2022   Bilateral leg weakness 06/14/2022   Physical deconditioning 02/25/2022   Hospital discharge follow-up 02/25/2022   MDD (major depressive disorder), recurrent episode, moderate (HCC) 02/25/2022   Encounter for examination following treatment at hospital 07/16/2021   Pharyngoesophageal dysphagia 03/12/2021   Migraine 08/09/2020   Hypothyroidism 08/09/2020   HLD (hyperlipidemia) 08/09/2020   Osteoporosis 08/09/2020   Multiple falls 08/09/2020   Weight loss 08/09/2020   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   Spinal stenosis of lumbar region 03/21/2019   Multiple sclerosis (HCC) 11/15/2017   Thoracic compression fracture (HCC) 11/15/2017   Low back pain 11/15/2017    Past Surgical History:  Procedure Laterality Date   CATARACT EXTRACTION W/PHACO Left 01/01/2020   Procedure: CATARACT EXTRACTION PHACO AND INTRAOCULAR LENS PLACEMENT LEFT EYE;  Surgeon: Tarri Farm, MD;  Location: [facility];  Service: Ophthalmology;  Laterality: Left;  CDE: 3.37   CATARACT EXTRACTION  W/PHACO Right 01/19/2020   Procedure: CATARACT EXTRACTION PHACO AND INTRAOCULAR LENS PLACEMENT RIGHT EYE;  Surgeon: Tarri Farm, MD;  Location: [facility];  Service: Ophthalmology;  Laterality: Right;  CDE: 3.16   TONSILLECTOMY      OB History   No obstetric history on file.      Home Medications    Prior to Admission medications   Medication Sig Start Date End Date Taking? Authorizing Provider  azelastine (ASTELIN) 0.1 % nasal spray Place 1 spray into both nostrils 2 (two) times daily. Use in each nostril as directed 12/22/23  Yes Corbin Dess, PA-C  acetaminophen  (TYLENOL ) 325 MG tablet Take 2 tablets (650 mg total) by mouth every 6 (six) hours as needed for mild pain (pain score 1-3), fever or headache. 07/19/23   Colin Dawley, MD  albuterol  (PROVENTIL ) (2.5 MG/3ML) 0.083% nebulizer solution Take 3 mLs (2.5 mg total) by nebulization every 4 (four) hours as needed for wheezing or shortness of breath. 07/19/23   Colin Dawley, MD  alendronate  (FOSAMAX ) 70 MG tablet TAKE 1 TABLET ONCE A WEEK WITH A FULL GLASS OF WATER ON AN EMPTY STOMACH 11/30/23   Meldon Sport, MD  BETA CAROTENE PO Take 1 capsule by mouth in the morning. Vitamin A, unknown strength.    [provider]  Cholecalciferol  (VITAMIN D3) 125 MCG (5000 UT) CAPS Take 1 capsule (5,000 Units total) by mouth daily. 03/10/23   Meldon Sport, MD  Cladribine , 6 Tabs, 10 MG TBPK Take 1.5 tablets daily for 4  days, then 23-27 days later, take 1.5 tablets daily for 4 days. 10/04/23   Merriam Abbey, DO  dextromethorphan  (DELSYM ) 30 MG/5ML liquid Take 5 mLs (30 mg total) by mouth 2 (two) times daily as needed for cough. 07/19/23   Colin Dawley, MD  diazepam  (VALIUM ) 5 MG tablet TAKE 1 TABLET BY MOUTH 30 TO 40 MINUTES BEFORE MRI 10/29/23   Janne Members R, DO  gabapentin  (NEURONTIN ) 100 MG capsule Take 1 capsule (100 mg total) by mouth 3 (three) times daily. 12/03/23   Merriam Abbey, DO  levothyroxine  (SYNTHROID ) 50 MCG  tablet TAKE 1 TABLET DAILY BEFORE BREAKFAST 02/08/23   Meldon Sport, MD  Multiple Vitamins-Minerals (ONE-A-DAY WOMENS 50 PLUS) TABS Take 1 tablet by mouth in the morning.    [provider]  nitrofurantoin , macrocrystal-monohydrate, (MACROBID ) 100 MG capsule Take 1 capsule (100 mg total) by mouth 2 (two) times daily. 10/15/23   Meldon Sport, MD  pantoprazole  (PROTONIX ) 40 MG tablet Take 1 tablet (40 mg total) by mouth daily. 07/19/23 07/18/24  Colin Dawley, MD  sertraline  (ZOLOFT ) 25 MG tablet Take 1 tablet (25 mg total) by mouth daily. 08/19/23   Yves Herb, MD  simvastatin  (ZOCOR ) 20 MG tablet TAKE 1 TABLET DAILY 02/08/23   Meldon Sport, MD  SUMAtriptan  (IMITREX ) 100 MG tablet TAKE 1 TABLET EVERY 2 HOURS AS NEEDED FOR MIGRAINE. MAY REPEAT IN 2 HOURS IF HEADACHE PERSISTS OR RECURS Patient taking differently: Take 100 mg by mouth See admin instructions. 100 mg at the onset of migraine. May repeat 100 mg in 2 hours if headache persists or recurs. 05/21/21   Merriam Abbey, DO  zonisamide  (ZONEGRAN ) 100 MG capsule Take 100 mg by mouth at bedtime. 07/20/23   [provider]    Family History Family History  Problem Relation Age of Onset   Dementia Mother    Dementia Father     Social History Social History   Tobacco Use   Smoking status: Never    Passive exposure: Never   Smokeless tobacco: Never   Tobacco comments:    Lived with smoker  Vaping Use   Vaping status: Never Used  Substance Use Topics   Alcohol use: Not Currently   Drug use: Never     Allergies   Codeine   Review of Systems Review of Systems PER HPI  Physical Exam Triage Vital Signs ED Triage Vitals  Encounter Vitals Group     BP 12/22/23 1303 109/73     Girls Systolic BP Percentile --      Girls Diastolic BP Percentile --      Boys Systolic BP Percentile --      Boys Diastolic BP Percentile --      Pulse Rate 12/22/23 1303 77     Resp 12/22/23 1303 16     Temp 12/22/23 1303  97.7 F (36.5 C)     Temp Source 12/22/23 1303 Oral     SpO2 12/22/23 1303 98 %     Weight --      Height --      Head Circumference --      Peak Flow --      Pain Score 12/22/23 1300 0     Pain Loc --      Pain Education --      Exclude from Growth Chart --    No data found.  Updated Vital Signs BP 109/73   Pulse 77   Temp 97.7  F (36.5 C) (Oral)   Resp 16   SpO2 98%   Visual Acuity Right Eye Distance:   Left Eye Distance:   Bilateral Distance:    Right Eye Near:   Left Eye Near:    Bilateral Near:     Physical Exam Vitals and nursing note reviewed.  Constitutional:      Appearance: Normal appearance. She is not ill-appearing.  HENT:     Head: Atraumatic.     Left Ear: Tympanic membrane normal.     Ears:     Comments: Right middle ear effusion    Nose: Nose normal.     Mouth/Throat:     Mouth: Mucous membranes are moist.   Eyes:     Extraocular Movements: Extraocular movements intact.     Conjunctiva/sclera: Conjunctivae normal.    Cardiovascular:     Rate and Rhythm: Normal rate.  Pulmonary:     Effort: Pulmonary effort is normal.   Musculoskeletal:        General: No swelling.     Cervical back: Normal range of motion and neck supple.   Skin:    General: Skin is warm and dry.   Neurological:     Mental Status: She is alert and oriented to person, place, and time.   Psychiatric:        Mood and Affect: Mood normal.        Thought Content: Thought content normal.        Judgment: Judgment normal.      UC Treatments / Results  Labs (all labs ordered are listed, but only abnormal results are displayed) Labs Reviewed - No data to display  EKG   Radiology No results found.  Procedures Procedures (including critical care time)  Medications Ordered in UC Medications  dexamethasone  (DECADRON ) injection 10 mg (10 mg Intramuscular Given 12/22/23 1326)    Initial Impression / Assessment and Plan / UC Course  I have reviewed the triage  vital signs and the nursing notes.  Pertinent labs & imaging results that were available during my care of the patient were reviewed by me and considered in my medical decision making (see chart for details).     Suspect right eustachian tube dysfunction.  Treat with Astelin nasal spray, IM Decadron , supportive home care.  Return for worsening symptoms.  Final Clinical Impressions(s) / UC Diagnoses   Final diagnoses:  Acute dysfunction of right eustachian tube   Discharge Instructions   None    ED Prescriptions     Medication Sig Dispense Auth. Provider   azelastine (ASTELIN) 0.1 % nasal spray Place 1 spray into both nostrils 2 (two) times daily. Use in each nostril as directed 30 mL Corbin Dess, PA-C      PDMP not reviewed this encounter.   Corbin Dess, [place] 12/23/23 1543

## 2023-12-28 NOTE — Progress Notes (Unsigned)
 NEUROLOGY FOLLOW UP OFFICE NOTE  Pamela Roman [id number]  Assessment/Plan:   Multiple sclerosis, progressed weakness aggravated by recent illnesses (flu, UTI) and deconditioning Migraine without aura, without status migrainosus, not intractable     1.  DMT:  [employer]ad  (finished second cycle) 2.  Migraine prevention:  zonisamide  100mg  QD 3.  Migraine rescue:  Sumatriptan  100mg  4.  D3 5000 IU daily   5.  Check labs in one month and then 6 months (CBC with diff, LFTs, IgG, vit D 6.  Refer to PT at outpatient center for better hands-on therapy 7.  Chair lift ordered 8.  Advised that she needs higher level of care with more supervision as she is a high fall risk 9.  Repeat MRI of thoracic spine with and without contrast to follow up on T8 vertebral body lesion. 9.  Follow up 6 months.   Total time spent in chart and face to face with patient:  41 minutes   Subjective:  Pamela Roman is a 68 year old left-handed white female who follows up for multiple sclerosis.  She is accompanied by her caregiver who supplements history.  MRIs personally reviewd.   UPDATE: Current DMT:  [employer]  (started April 2024, second cycle April and May 2025) Other current medications:  Sertraline  25mg  daily; sumatriptan  100mg  PRN; D 5000 IU daily; levothyroxine ; Aleve BID for pain, zonisamide  100mg  every day, gabapentin  100mg  TID, sumatriptan  100mg  PRN  Admitted to [facility] on 07/12/2023 for concern of MS flare.  For several days, she had been experiencing confusion and bilateral lower extremity weakness.  MRIs of neuro-axis revealed no acute or progressed findings.  She was diagnosed with the flu.  Since then, she has had UTIs.  This has affected her weakness.  Overall, she is much weaker.  She can't stand up anymore.  It takes more effort to transfer from chair to chair.  Right leg is weaker than her left leg.  She has had several falls.  She feels she needs a lift.  She receives home PT but they often  only work with her for 10 minutes and often cancels appointments.  She has transportation and would like referral to PT at an outpatient center.  Has a caregiver that comes by home three times a week for 3 hours.  Imaging: 10/31/2023 MRI BRAIN W WO:  1. Chronically advanced demyelinating disease is stable since January (2025) Brain MRI. No active or progressed demyelinating lesions. 10/31/2023 MRI C-SPINE W WO:  1. Advanced chronic cervical spinal cord demyelination is stable since January (2025) MRI, severe at the cervicothoracic junction with stable chronic cord volume loss there. No active or progressed demyelinating lesions. 2. Stable comparatively mild for age cervical spine degeneration. 10/31/2023 MRI T-SPINE W WO:  1. Chronic thoracic spinal cord demyelination appears stable. No active or progressed demyelinating lesion identified. 2. Numerous chronic compression fractures in the thoracic and upper lumbar spine remain stable. However, there is a solitary new subcentimeter and enhancing T8 vertebral body lesion, indeterminate. Recommend repeat thoracic MRI in 3 months to evaluate for stability. No other acute osseous abnormality.   Vision:  If she looks at distance, she sees horizontal double vision.  Trouble writing and reading.  She had cataract surgery in 2021 and it didn't improve.  Likely related to INO. Motor: as above Sensory:  numb in legs. Gait:  wheelchair bound.   Bowel/Bladder:  Reports both bowel and bladder incontinence.  Fatigue:  yes Cognition:  Word finding issues.  Mood:  Worsening depression.  Seen in the ED at [facility] on 8/8 afater deliberately ingesting the full 90 day bottle of sertraline .  Does not feel suicidal.   Migraines:  On zonisamide .  Now infrequent  Aborts quickly with sumatriptan . On disability.  S  07/12/2023 LABS:  CBC with WBC 7.2, HGB 14.2, HCT 43.7, PLT 173, ALC 0.3; hepatic panel with t bili 0.8, ALP 79, AST 22, ALT 15 07/18/2023 LABS:  BMP with Na  139, K 3.6, Cl 109, CO2 22, glucose 90, BUN 23, Cr 0.60, Ca 8.4, GFR >60     HISTORY: She was diagnosed with multiple sclerosis in the early 1990s after experiencing right optic neuritis.  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  She underwent LP and MRIs, which were consistent with MS.  She initially started Betaseron and then Avonex until 2011, after which she was switched to Tysabri.  Within the last 5 years, she has had progression of disease, but particularly worse since moving here in March.  She has fallen 4 times.  She started using a walker about 5 years ago.     She had an MS flare in May 2021, describing increased difficulty walking and bilateral lower extremity numbness and tingling.  For paresthesias, she was started on gabapentin .  MRI of spinal cord showed acute lesions.  Started Mayzent  in July 2021 but patient noted increased paresthesias in the legs.  She was prescribed high-dose prednisone .  In September 2023, she reported worsening bilateral leg weakness.  Home Solu-Medrol  infusions were too expensive, so she received high-dose prednisone  that seemed to work and was back to baseline.  On 12/4, her legs became very weak again and she couldn't move them. However, weakness resolved overnight.  The weakness returned on 12/9, for which she was admitted to [facility].  MRI of brain/cervical/thoracic with and without contrast on 12/11 revealed no new lesions in brain and cervical spine but did demonstrate new lesion at T12-L1 and enhancing lesion at T5-T6.  She received 3 day course of 1gm IV Solu-Medrol  and discharged to SNF.  She has chronic low back pain with vertebral fractures and osteoporosis.  NCV-EMG of lower extremities on 09/23/2021 showed bilateral chronic L3-4 radiculopathy.  MRI Lumbar spine on 11/15/2020 showed multilevel spondylosis with moderate spinal canal stenosis and severe bilateral lateral recess stenosis at L4-L5 as well as chronic compression  deformities of T11, L1, and L2, including severe right foraminal stenosis at L2-3.   Declined referral to neurosurgery at that time.    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 She then was receiving Solu-Medrol  every month.  Mayzent  from June 2021-Jan 2024 (breakthrough flare) Other past medications: Gabapentin  (weakness)   Imaging: 07/12/2023 MRI BRAIN W WO:  No visible change since the study of September. Widespread foci of demyelination within the cerebellum, brainstem and cerebral hemispheric white matter. No new or progressive lesions. No lesions show restricted diffusion or contrast enhancement. 07/12/2023 MRI C-SPINE W WO:  1. Numerous foci of abnormal T2 signal within the cervical cord without evidence of change or progression since September. Cord volume loss at the C7-T1 level is similar. No abnormal regional enhancement. 2. Mild non-compressive spondylosis at C5-6 and C6-7. 07/12/2023 MRI T-SPINE W WO:  1. No change since the prior study. Small foci of T2 signal within the cord consistent with the history  of multiple sclerosis. No evidence of progressive disease. No abnormal contrast enhancement. 2. Old healed partial compression fractures at T3, T5, T6, T10 and T11. Old fracture at T12 with previous vertebral augmentation. No evidence of recent or progressive fracture. 07/12/2023 MRI L-SPINE W WO:  1. Old healed augmented fracture at L1. Old healed inferior endplate fracture at L2. No new fracture, progression or ongoing edema. 2. Mild degenerative changes elsewhere in the lumbar spine as outlined above. Mild narrowing of the lateral recesses and foramina at L2-3 and L3-4 but no visible neural compression. 03/26/2023 MRI BRAIN W WO:  Similar extensive chronic demyelination intracranially and in the cervical and thoracic cord. No new lesions or evidence of active  demyelination. 03/26/2023 MRI C & T-SPINE W WO:  Similar extensive chronic demyelination intracranially and in the cervical and thoracic cord. No new lesions or evidence of active demyelination. 06/15/2022 MRI BRAIN W WO:  Findings consistent with the patient's history of multiple sclerosis. No new lesions are visualized in the brain 06/15/2022 MRI C-SPINE W WO:  No new lesions are visualized in the cervical spine.  No evidence of active demyelination in the cervical spine. 06/15/2022 MRI T-SPINE W WO:   Extensive demyelinating lesions throughout the thoracic spinal cord, with a new demyelinating lesion seen at the T12-L1 level. There may be a focal region of contrast enhancement at the T5-T6 level, which could suggest suggesting active demyelination. New acute to subacute superior endplate compression deformity at the T10 level. 12/31/2021 MRI BRAIN W WO:  1. Findings consistent with widespread chronic demyelinating disease/multiple sclerosis. Overall, disease burden is not significantly changed from previous, with no definite new lesions. No evidence for active demyelination on today's exam. 2. Underlying mildly advanced cerebral atrophy for age. 12/31/2021 MRI C-SPINE W WO:  1. No significant interval change in extensive patchy signal abnormality throughout the cervical spinal cord, consistent with history of chronic multiple sclerosis. No new lesions or significant disease progression. No evidence for active demyelination.  2. New mild chronic compression deformity at the superior endplate of T3.  3. Moderate spondylosis at C5-6 and C6-7 without significant stenosis. 12/31/2021 MRI T-SPINE W WO:  1. Diffuse patchy and hazy signal abnormality involving the majority of the thoracic cord, most pronounced at the level of T6-7. In comparison with prior exam, these changes are likely similar, although prior exam is degraded by motion, consistent with history of chronic demyelinating disease/multiple  sclerosis. Overall, appearance is similar to prior, without evidence for significant disease progression. No definite new lesions. No active demyelination. 2. New chronic compression deformities involving the T3, T5, and T6 vertebral bodies. Additional chronic compression fractures at T11 through L2 are stable. 09/16/2020 MRI BRAIN W WO:  stable with resolution of previous small active lesion 09/09/2020 MRI C & T-SPINE W WO:  1. Unchanged distribution of demyelinating lesions throughout the cervical spinal cord. Motion degraded images of the thoracic spine,but grossly unchanged. 2. No evidence of active demyelination within the cervical or thoracic spine.  04/14/2020 MRI BRAIN W WO:  subcentimeter focus of enhancement within the left posterior medulla, consistent with active demyelination. 12/23/2019 MRI CERVICAL SPINE W WO:  1. Extensive patchy T2 signal abnormality throughout the cervical spinal cord, consistent with demyelinating disease/multiple sclerosis. Faint patchy postcontrast enhancement about a few lesions.  AT C4 AND C7, consistent with active demyelination.  2. Mild multilevel cervical spondylosis without significant spinal stenosis. Mild bilateral C6 foraminal narrowing. 12/23/2019 MRI THORACIC SPINE W WO:  1. Patchy signal abnormality throughout  much of the thoracic spinal cord, consistent with demyelinating disease/multiple sclerosis. No evidence for active demyelination within the thoracic spinal cord.  2. Acute to subacute compression fracture extending through the inferior endplate of L2 with up to 40% height loss and 4 mm of bony retropulsion, incompletely assessed on this exam. Finding could be further evaluated with dedicated MRI of the lumbar spine as clinically desired.  3. Additional chronic lower thoracic compression fractures as above. 07/24/2019 MRI BRAIN W WO:  1.  Bilateral periventricular and radial white matter hyperintensities involving the frontal parietal and temporal  regions bilaterally stable compared to 04/25/19.  2.  Compared to prior study the white matter hyperintensities, T1 low signal foci associated with these, and the absence of contrast enhancement appears stable.  3.  No evidence of new plaques or contrast enhancement to suggest new inflammatory foci. 04/25/2019 MRI BRAIN W WO:  CEREBRUM:  Extensive multiple sclerosis plaques are visualized.  These is seen adjacent to the tmporal horns bilaterally extending to surround the atria and occipital horns bilaterally and extensively throughout the periventricular white matter bilateral.  These also extend into the bifrontal corona radiata and centrum semiovale.  No involvement of brainstem or cerebellum.  PAST MEDICAL HISTORY: Past Medical History:  Diagnosis Date   Anxiety    Depression    [employer] 08/06/2020   Hx of migraines    Hyperglycemia    Hypothyroidism    Migraines    Multiple sclerosis (HCC)    Neuromuscular disorder (HCC)     MEDICATIONS: Current Outpatient Medications on File Prior to Visit  Medication Sig Dispense Refill   acetaminophen  (TYLENOL ) 325 MG tablet Take 2 tablets (650 mg total) by mouth every 6 (six) hours as needed for mild pain (pain score 1-3), fever or headache.     albuterol  (PROVENTIL ) (2.5 MG/3ML) 0.083% nebulizer solution Take 3 mLs (2.5 mg total) by nebulization every 4 (four) hours as needed for wheezing or shortness of breath. 75 mL 12   alendronate  (FOSAMAX ) 70 MG tablet TAKE 1 TABLET ONCE A WEEK WITH A FULL GLASS OF WATER ON AN EMPTY STOMACH 12 tablet 3   azelastine (ASTELIN) 0.1 % nasal spray Place 1 spray into both nostrils 2 (two) times daily. Use in each nostril as directed 30 mL 0   BETA CAROTENE PO Take 1 capsule by mouth in the morning. Vitamin A, unknown strength.     Cholecalciferol  (VITAMIN D3) 125 MCG (5000 UT) CAPS Take 1 capsule (5,000 Units total) by mouth daily. 30 capsule 11   Cladribine , 6 Tabs, 10 MG TBPK Take 1.5 tablets daily for 4 days,  then 23-27 days later, take 1.5 tablets daily for 4 days. 12 each 0   diazepam  (VALIUM ) 5 MG tablet TAKE 1 TABLET BY MOUTH 30 TO 40 MINUTES BEFORE MRI 2 tablet 0   gabapentin  (NEURONTIN ) 100 MG capsule Take 1 capsule (100 mg total) by mouth 3 (three) times daily. (Patient taking differently: Take 100 mg by mouth 3 (three) times daily. Per Patient as needed) 270 capsule 1   levothyroxine  (SYNTHROID ) 50 MCG tablet TAKE 1 TABLET DAILY BEFORE BREAKFAST 90 tablet 3   Multiple Vitamins-Minerals (ONE-A-DAY WOMENS 50 PLUS) TABS Take 1 tablet by mouth in the morning.     nitrofurantoin , macrocrystal-monohydrate, (MACROBID ) 100 MG capsule Take 1 capsule (100 mg total) by mouth 2 (two) times daily. 10 capsule 0   pantoprazole  (PROTONIX ) 40 MG tablet Take 1 tablet (40 mg total) by mouth daily. 30 tablet 1  sertraline  (ZOLOFT ) 25 MG tablet Take 1 tablet (25 mg total) by mouth daily. 90 tablet 1   simvastatin  (ZOCOR ) 20 MG tablet TAKE 1 TABLET DAILY 90 tablet 3   SUMAtriptan  (IMITREX ) 100 MG tablet TAKE 1 TABLET EVERY 2 HOURS AS NEEDED FOR MIGRAINE. MAY REPEAT IN 2 HOURS IF HEADACHE PERSISTS OR RECURS 27 tablet 0   zonisamide  (ZONEGRAN ) 100 MG capsule Take 100 mg by mouth at bedtime.     No current facility-administered medications on file prior to visit.     ALLERGIES: Allergies  Allergen Reactions   Codeine Nausea And Vomiting    FAMILY HISTORY: Family History  Problem Relation Age of Onset   Dementia Mother    Dementia Father       Objective:  Blood pressure 105/66, pulse 83, height 5' (1.524 m), weight 140 lb (63.5 kg), SpO2 99%. General: No acute distress.  Patient appears well-groomed.   Head:  Normocephalic/atraumatic Eyes:  Fundi examined but not visualized Neck: supple, no paraspinal tenderness, full range of motion Heart:  Regular rate and rhythm Lungs:  Clear to auscultation bilaterally Back: No paraspinal tenderness Neurological Exam: alert and oriented.  Speech fluent and not  dysarthric, language intact.  Right APD.  Bilateral INO.  Otherwise, CN II-XII intact. Bulk and tone normal, muscle strength 3-/5 bilateral hip flexion, knee extension and right dorsiflexion.  Otherwise 5/5 throughout.  Pinprick sensation reduced in left upper and lower extremities, vibratory sensation reduced in both feet.  Deep tendon reflexes 3+ on left, 2+ on right.  Babinski present on left.  Finger to nose testing with trace ataxia.  In wheelchair.  Cannot stand up.  Unable to walk.     Juliene Dunnings, DO  CC: Suzzane Blanch, MD

## 2023-12-29 ENCOUNTER — Encounter: Payer: Self-pay | Admitting: Neurology

## 2023-12-29 ENCOUNTER — Ambulatory Visit (INDEPENDENT_AMBULATORY_CARE_PROVIDER_SITE_OTHER): Admitting: Neurology

## 2023-12-29 VITALS — BP 105/66 | HR 83 | Ht 60.0 in | Wt 140.0 lb

## 2023-12-29 DIAGNOSIS — G35 Multiple sclerosis: Secondary | ICD-10-CM | POA: Diagnosis not present

## 2023-12-29 DIAGNOSIS — R29898 Other symptoms and signs involving the musculoskeletal system: Secondary | ICD-10-CM

## 2023-12-29 DIAGNOSIS — E559 Vitamin D deficiency, unspecified: Secondary | ICD-10-CM

## 2023-12-29 DIAGNOSIS — R296 Repeated falls: Secondary | ICD-10-CM | POA: Diagnosis not present

## 2023-12-29 NOTE — Patient Instructions (Addendum)
 Refer to physical therapy.  Let me know where you would like to go. Continue D3 5000 units daily Check labs in one month and again in 6 months (CBC with diff, LFTs, IgG, vit D) Follow up in 6 months (about 1 week after repeat labs)

## 2023-12-30 ENCOUNTER — Telehealth: Payer: Self-pay | Admitting: Neurology

## 2023-12-30 NOTE — Telephone Encounter (Signed)
 PT. Cld to inquire about homecare covered by her insurance, please call with updates

## 2023-12-30 NOTE — Telephone Encounter (Signed)
 Patient speak to her insurance to see what they cover and do not.   [facility] information given as well.

## 2024-01-09 ENCOUNTER — Ambulatory Visit
Admission: RE | Admit: 2024-01-09 | Discharge: 2024-01-09 | Disposition: A | Discharge status: Home / Self Care | Source: Ambulatory Visit | Attending: Nurse Practitioner | Admitting: Nurse Practitioner

## 2024-01-09 ENCOUNTER — Telehealth: Payer: Self-pay

## 2024-01-09 VITALS — BP 112/72 | HR 80 | Temp 98.2°F | Resp 18

## 2024-01-09 DIAGNOSIS — B029 Zoster without complications: Secondary | ICD-10-CM

## 2024-01-09 MED ORDER — PREDNISONE 20 MG PO TABS
40.0000 mg | ORAL_TABLET | Freq: Every day | ORAL | 0 refills | Status: DC
Start: 2024-01-09 — End: 2024-01-09

## 2024-01-09 MED ORDER — PREDNISONE 20 MG PO TABS: 40.0000 mg | ORAL_TABLET | Freq: Every day | ORAL | 0 refills | Status: AC

## 2024-01-09 MED ORDER — VALACYCLOVIR HCL 1 G PO TABS: 1000.0000 mg | ORAL_TABLET | Freq: Three times a day (TID) | ORAL | 0 refills | Status: DC

## 2024-01-09 NOTE — ED Triage Notes (Signed)
 Rash on left side of neck x 2 days

## 2024-01-09 NOTE — ED Provider Notes (Signed)
 [facility]    CSN: [id number] Arrival date & time: 01/09/24  0944      History   Chief Complaint Chief Complaint  Patient presents with   Rash    Possible shingles - Entered by patient    HPI Pamela Roman is a 68 y.o. female.   The history is provided by the patient and a relative (Sister).   Patient brought in by her sister for complaints of a rash to the left neck.  Patient states symptoms started over the past 2 days.  Patient states prior to her symptoms starting, she noticed a aching feeling in her neck.  She states that the rash is uncomfortable, but that it is not uncomfortable to the touch.  She reports prior history of shingles and chickenpox as a child.  She denies fever, chills, fatigue, chest pain, abdominal pain, nausea, vomiting, or diarrhea.  Patient with underlying history of MS.  Past Medical History:  Diagnosis Date   Anxiety    Depression    Phreesia 08/06/2020   Hx of migraines    Hyperglycemia    Hypothyroidism    Migraines    Multiple sclerosis (HCC)    Neuromuscular disorder [facility])     Patient Active Problem List   Diagnosis Date Noted   Generalized weakness 07/12/2023   Altered mental status 07/12/2023   Fever, unspecified 07/12/2023   Hypertrophic toenail 10/05/2022   Seasonal allergic rhinitis 10/05/2022   Multiple sclerosis exacerbation (HCC) 06/15/2022   Bilateral leg weakness 06/14/2022   Physical deconditioning 02/25/2022   Hospital discharge follow-up 02/25/2022   MDD (major depressive disorder), recurrent episode, moderate (HCC) 02/25/2022   Encounter for examination following treatment at hospital 07/16/2021   Pharyngoesophageal dysphagia 03/12/2021   Migraine 08/09/2020   Hypothyroidism 08/09/2020   HLD (hyperlipidemia) 08/09/2020   Osteoporosis 08/09/2020   Multiple falls 08/09/2020   Weight loss 08/09/2020   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   Spinal stenosis of lumbar region 03/21/2019    Multiple sclerosis (HCC) 11/15/2017   Thoracic compression fracture (HCC) 11/15/2017   Low back pain 11/15/2017    Past Surgical History:  Procedure Laterality Date   CATARACT EXTRACTION W/PHACO Left 01/01/2020   Procedure: CATARACT EXTRACTION PHACO AND INTRAOCULAR LENS PLACEMENT LEFT EYE;  Surgeon: Harrie Agent, MD;  Location: [facility];  Service: Ophthalmology;  Laterality: Left;  CDE: 3.37   CATARACT EXTRACTION W/PHACO Right 01/19/2020   Procedure: CATARACT EXTRACTION PHACO AND INTRAOCULAR LENS PLACEMENT RIGHT EYE;  Surgeon: Harrie Agent, MD;  Location: [facility];  Service: Ophthalmology;  Laterality: Right;  CDE: 3.16   TONSILLECTOMY      OB History   No obstetric history on file.      Home Medications    Prior to Admission medications   Medication Sig Start Date End Date Taking? Authorizing Provider  acetaminophen  (TYLENOL ) 325 MG tablet Take 2 tablets (650 mg total) by mouth every 6 (six) hours as needed for mild pain (pain score 1-3), fever or headache. 07/19/23   Pearlean Manus, MD  albuterol  (PROVENTIL ) (2.5 MG/3ML) 0.083% nebulizer solution Take 3 mLs (2.5 mg total) by nebulization every 4 (four) hours as needed for wheezing or shortness of breath. 07/19/23   Pearlean Manus, MD  alendronate  (FOSAMAX ) 70 MG tablet TAKE 1 TABLET ONCE A WEEK WITH A FULL GLASS OF WATER ON AN EMPTY STOMACH 11/30/23   Tobie Suzzane POUR, MD  azelastine  (ASTELIN ) 0.1 % nasal spray Place 1 spray into both nostrils 2 (  two) times daily. Use in each nostril as directed 12/22/23   Stuart Vernell Norris, PA-C  BETA CAROTENE PO Take 1 capsule by mouth in the morning. Vitamin A, unknown strength.    [provider]  Cholecalciferol  (VITAMIN D3) 125 MCG (5000 UT) CAPS Take 1 capsule (5,000 Units total) by mouth daily. 03/10/23   Tobie Suzzane POUR, MD  Cladribine , 6 Tabs, 10 MG TBPK Take 1.5 tablets daily for 4 days, then 23-27 days later, take 1.5 tablets daily for 4 days. 10/04/23   Skeet Juliene SAUNDERS, DO   diazepam  (VALIUM ) 5 MG tablet TAKE 1 TABLET BY MOUTH 30 TO 40 MINUTES BEFORE MRI 10/29/23   Skeet Juliene SAUNDERS, DO  gabapentin  (NEURONTIN ) 100 MG capsule Take 1 capsule (100 mg total) by mouth 3 (three) times daily. Patient taking differently: Take 100 mg by mouth 3 (three) times daily. Per Patient as needed 12/03/23   Skeet Juliene SAUNDERS, DO  levothyroxine  (SYNTHROID ) 50 MCG tablet TAKE 1 TABLET DAILY BEFORE BREAKFAST 02/08/23   Tobie Suzzane POUR, MD  Multiple Vitamins-Minerals (ONE-A-DAY WOMENS 50 PLUS) TABS Take 1 tablet by mouth in the morning.    [provider]  pantoprazole  (PROTONIX ) 40 MG tablet Take 1 tablet (40 mg total) by mouth daily. 07/19/23 07/18/24  Pearlean Manus, MD  sertraline  (ZOLOFT ) 25 MG tablet Take 1 tablet (25 mg total) by mouth daily. 08/19/23   Carvin Arvella HERO, MD  simvastatin  (ZOCOR ) 20 MG tablet TAKE 1 TABLET DAILY 02/08/23   Tobie Suzzane POUR, MD  SUMAtriptan  (IMITREX ) 100 MG tablet TAKE 1 TABLET EVERY 2 HOURS AS NEEDED FOR MIGRAINE. MAY REPEAT IN 2 HOURS IF HEADACHE PERSISTS OR RECURS 05/21/21   Skeet Juliene SAUNDERS, DO  zonisamide  (ZONEGRAN ) 100 MG capsule Take 100 mg by mouth at bedtime. 07/20/23   [provider]    Family History Family History  Problem Relation Age of Onset   Dementia Mother    Dementia Father     Social History Social History   Tobacco Use   Smoking status: Never    Passive exposure: Never   Smokeless tobacco: Never   Tobacco comments:    Lived with smoker  Vaping Use   Vaping status: Never Used  Substance Use Topics   Alcohol use: Not Currently   Drug use: Never     Allergies   Codeine   Review of Systems Review of Systems Per HPI  Physical Exam Triage Vital Signs ED Triage Vitals  Encounter Vitals Group     BP 01/09/24 0950 112/72     Girls Systolic BP Percentile --      Girls Diastolic BP Percentile --      Boys Systolic BP Percentile --      Boys Diastolic BP Percentile --      Pulse Rate 01/09/24 0950 80     Resp  01/09/24 0950 18     Temp 01/09/24 0950 98.2 F (36.8 C)     Temp Source 01/09/24 0950 Oral     SpO2 01/09/24 0950 97 %     Weight --      Height --      Head Circumference --      Peak Flow --      Pain Score 01/09/24 0952 6     Pain Loc --      Pain Education --      Exclude from Growth Chart --    No data found.  Updated Vital Signs BP 112/72 (  BP Location: Left Arm)   Pulse 80   Temp 98.2 F (36.8 C) (Oral)   Resp 18   SpO2 97%   Visual Acuity Right Eye Distance:   Left Eye Distance:   Bilateral Distance:    Right Eye Near:   Left Eye Near:    Bilateral Near:     Physical Exam Vitals and nursing note reviewed.  Constitutional:      General: She is not in acute distress.    Appearance: Normal appearance.  HENT:     Head: Normocephalic.  Eyes:     Extraocular Movements: Extraocular movements intact.     Conjunctiva/sclera: Conjunctivae normal.     Pupils: Pupils are equal, round, and reactive to light.  Cardiovascular:     Rate and Rhythm: Normal rate and regular rhythm.     Pulses: Normal pulses.     Heart sounds: Normal heart sounds.  Pulmonary:     Effort: Pulmonary effort is normal. No respiratory distress.     Breath sounds: Normal breath sounds. No stridor. No wheezing, rhonchi or rales.  Abdominal:     General: Bowel sounds are normal.     Palpations: Abdomen is soft.     Tenderness: There is no abdominal tenderness.  Musculoskeletal:     Cervical back: Normal range of motion.  Skin:    General: Skin is warm and dry.     Findings: Rash present.      Neurological:     General: No focal deficit present.     Mental Status: She is alert and oriented to person, place, and time.  Psychiatric:        Mood and Affect: Mood normal.        Behavior: Behavior normal.      UC Treatments / Results  Labs (all labs ordered are listed, but only abnormal results are displayed) Labs Reviewed - No data to display  EKG   Radiology No results  found.  Procedures Procedures (including critical care time)  Medications Ordered in UC Medications - No data to display  Initial Impression / Assessment and Plan / UC Course  I have reviewed the triage vital signs and the nursing notes.  Pertinent labs & imaging results that were available during my care of the patient were reviewed by me and considered in my medical decision making (see chart for details).  Symptoms consistent with a shingles rash.  Will start patient on valacyclovir  1000 mg 3 times daily for the next 7 days.  Prednisone  40 mg prescribed for inflammation and itching.  Patient will follow-up with neurologist to ensure it is safe for her to begin the prednisone .  She states that she already does take gabapentin , advised patient to take medication as prescribed for pain.  Supportive care recommendations were provided and discussed with the patient to include over-the-counter analgesics, cool compresses to the area for pain or discomfort, and to cover the areas if they begin to drain.  Patient advised to follow-up with her PCP to discuss the possibility of receipt of the shingles vaccine in the near future.  Patient was in agreement with this plan of care and verbalizes understanding.  All questions were answered.  Patient stable for discharge.  Final Clinical Impressions(s) / UC Diagnoses   Final diagnoses:  None   Discharge Instructions   None    ED Prescriptions   None    PDMP not reviewed this encounter.   Gilmer Etta PARAS, NP 01/09/24 1009

## 2024-01-09 NOTE — Discharge Instructions (Signed)
 Your symptoms are consistent with shingles. Take medication as prescribed. May take over-the-counter Tylenol  as needed for pain, fever, general discomfort. Increase fluids and allow for plenty of rest. Recommend using a cool cloth to the affected areas to help with itching, pain, or inflammation. Do not scratch or irritate the areas while symptoms persist. If the areas begin to ooze or blister, keep them covered. As discussed, after the rash resolves, if you continue to experience a nerve pain in the area, please follow-up with your primary care physician for further evaluation. Please reach out to your neurologist to ensure it is safe for you to begin the prednisone .  Recommend following up with your primary care physician to advise of your recent shingles diagnosis, discuss when it may be possible for you to get the shingles vaccine. Follow-up as needed.

## 2024-01-10 ENCOUNTER — Encounter: Payer: Self-pay | Admitting: Neurology

## 2024-01-10 ENCOUNTER — Telehealth: Payer: Self-pay | Admitting: Neurology

## 2024-01-10 NOTE — Telephone Encounter (Signed)
 Pt has shingles, they put her on an antiviral medication and gabapentin . They are wanting her to take prednisone  10mg  but she wants to make sure its okay to take it. She wants to make sure Dr.Jaffe says its okay.

## 2024-01-10 NOTE — Telephone Encounter (Signed)
 Patient needs to speak to someone about some medication that another provider put her on  for Shingles  please call

## 2024-01-10 NOTE — Telephone Encounter (Signed)
Tried calling patient no answer. LMOVM to call the office back.

## 2024-01-10 NOTE — Telephone Encounter (Signed)
 Mychart message sent.

## 2024-01-11 ENCOUNTER — Encounter: Payer: Self-pay | Admitting: Internal Medicine

## 2024-01-11 ENCOUNTER — Ambulatory Visit: Payer: Self-pay | Admitting: Internal Medicine

## 2024-01-11 ENCOUNTER — Telehealth (INDEPENDENT_AMBULATORY_CARE_PROVIDER_SITE_OTHER): Payer: Self-pay | Admitting: Internal Medicine

## 2024-01-11 DIAGNOSIS — K2900 Acute gastritis without bleeding: Secondary | ICD-10-CM | POA: Insufficient documentation

## 2024-01-11 DIAGNOSIS — B029 Zoster without complications: Secondary | ICD-10-CM | POA: Insufficient documentation

## 2024-01-11 DIAGNOSIS — K580 Irritable bowel syndrome with diarrhea: Secondary | ICD-10-CM | POA: Insufficient documentation

## 2024-01-11 MED ORDER — PANTOPRAZOLE SODIUM 40 MG PO TBEC: 40.0000 mg | DELAYED_RELEASE_TABLET | Freq: Every day | ORAL | 1 refills | Status: DC

## 2024-01-11 NOTE — Patient Instructions (Signed)
 Please continue acyclovir and oral prednisone  as prescribed.  Please take pantoprazole  while taking oral steroids.  Please keep food diary to associate symptoms with certain foods.  Please take loperamide as needed for diarrhea.

## 2024-01-11 NOTE — Assessment & Plan Note (Signed)
 Epigastric pain likely due to gastritis, induced by oral steroid Pantoprazole  40 mg once daily, advised to take it while taking oral steroids Avoid hot and spicy food

## 2024-01-11 NOTE — Progress Notes (Signed)
 Virtual Visit via Video Note   Because of [name]'s co-morbid illnesses, she is at least at moderate risk for complications without adequate follow up.  This format is felt to be most appropriate for this patient at this time.  All issues noted in this document were discussed and addressed.  A limited physical exam was performed with this format.      Evaluation Performed:  Follow-up visit  Date:  01/11/2024   ID:  Pamela Roman, DOB 1955-12-30, MRN [id number]  Patient Location: Home Provider Location: Office/Clinic  Participants: Patient Location of Patient: Home Location of Provider: Telehealth Consent was obtain for visit to be over via telehealth. I verified that I am speaking with the correct person using two identifiers.  PCP:  Tobie Suzzane POUR, MD   Chief Complaint: Urgent care follow up  History of Present Illness:    Pamela Roman is a 68 y.o. female with PMH of progressive multiple sclerosis, physical deconditioning, osteoporosis, multiple falls, hypothyroidism and depression who has a video visit for follow-up of recent urgent care visit.  She was recently diagnosed with shingles on 01/09/24.  She has crusting vesicles over left side of the neck, which are burning in nature.  She has been placed on valacyclovir  and oral prednisone .  She has had shingles infection in the past.  She also reports epigastric pain recently.  She has had intermittent diarrhea and has to wear diaper due to her limited mobility.  Denies abdominal cramping, nausea or vomiting currently.  The patient does not have symptoms concerning for COVID-19 infection (fever, chills, cough, or new shortness of breath).   Past Medical, Surgical, Social History, Allergies, and Medications have been Reviewed.  Past Medical History:  Diagnosis Date   Anxiety    Depression    Phreesia 08/06/2020   Hx of migraines    Hyperglycemia    Hypothyroidism    Migraines    Multiple sclerosis (HCC)     Neuromuscular disorder [facility])    Past Surgical History:  Procedure Laterality Date   CATARACT EXTRACTION W/PHACO Left 01/01/2020   Procedure: CATARACT EXTRACTION PHACO AND INTRAOCULAR LENS PLACEMENT LEFT EYE;  Surgeon: Harrie Agent, MD;  Location: [facility];  Service: Ophthalmology;  Laterality: Left;  CDE: 3.37   CATARACT EXTRACTION W/PHACO Right 01/19/2020   Procedure: CATARACT EXTRACTION PHACO AND INTRAOCULAR LENS PLACEMENT RIGHT EYE;  Surgeon: Harrie Agent, MD;  Location: [facility];  Service: Ophthalmology;  Laterality: Right;  CDE: 3.16   TONSILLECTOMY       Current Meds  Medication Sig   acetaminophen  (TYLENOL ) 325 MG tablet Take 2 tablets (650 mg total) by mouth every 6 (six) hours as needed for mild pain (pain score 1-3), fever or headache.   albuterol  (PROVENTIL ) (2.5 MG/3ML) 0.083% nebulizer solution Take 3 mLs (2.5 mg total) by nebulization every 4 (four) hours as needed for wheezing or shortness of breath.   alendronate  (FOSAMAX ) 70 MG tablet TAKE 1 TABLET ONCE A WEEK WITH A FULL GLASS OF WATER ON AN EMPTY STOMACH   azelastine  (ASTELIN ) 0.1 % nasal spray Place 1 spray into both nostrils 2 (two) times daily. Use in each nostril as directed   BETA CAROTENE PO Take 1 capsule by mouth in the morning. Vitamin A, unknown strength.   Cholecalciferol  (VITAMIN D3) 125 MCG (5000 UT) CAPS Take 1 capsule (5,000 Units total) by mouth daily.   Cladribine , 6 Tabs, 10 MG TBPK Take 1.5 tablets daily for 4 days,  then 23-27 days later, take 1.5 tablets daily for 4 days.   diazepam  (VALIUM ) 5 MG tablet TAKE 1 TABLET BY MOUTH 30 TO 40 MINUTES BEFORE MRI   gabapentin  (NEURONTIN ) 100 MG capsule Take 1 capsule (100 mg total) by mouth 3 (three) times daily. (Patient taking differently: Take 100 mg by mouth 3 (three) times daily. Per Patient as needed)   levothyroxine  (SYNTHROID ) 50 MCG tablet TAKE 1 TABLET DAILY BEFORE BREAKFAST   Multiple Vitamins-Minerals (ONE-A-DAY WOMENS 50 PLUS) TABS Take 1 tablet by  mouth in the morning.   predniSONE  (DELTASONE ) 20 MG tablet Take 2 tablets (40 mg total) by mouth daily with breakfast for 5 days.   sertraline  (ZOLOFT ) 25 MG tablet Take 1 tablet (25 mg total) by mouth daily.   simvastatin  (ZOCOR ) 20 MG tablet TAKE 1 TABLET DAILY   SUMAtriptan  (IMITREX ) 100 MG tablet TAKE 1 TABLET EVERY 2 HOURS AS NEEDED FOR MIGRAINE. MAY REPEAT IN 2 HOURS IF HEADACHE PERSISTS OR RECURS   valACYclovir  (VALTREX ) 1000 MG tablet Take 1 tablet (1,000 mg total) by mouth 3 (three) times daily.   zonisamide  (ZONEGRAN ) 100 MG capsule Take 100 mg by mouth at bedtime.   [DISCONTINUED] pantoprazole  (PROTONIX ) 40 MG tablet Take 1 tablet (40 mg total) by mouth daily.     Allergies:   Codeine   ROS:   Please see the history of present illness. All other systems reviewed and are negative.   Labs/Other Tests and Data Reviewed:    Recent Labs: 07/12/2023: ALT 15; Hemoglobin 14.2; Platelets 173; TSH 0.621 07/13/2023: Magnesium 2.1 07/18/2023: BUN 23; Potassium 3.6; Sodium 139 07/19/2023: Creatinine, Ser 0.56   Recent Lipid Panel Lab Results  Component Value Date/Time   CHOL 218 (H) 07/16/2022 11:42 AM   TRIG 128 07/16/2022 11:42 AM   HDL 51 07/16/2022 11:42 AM   CHOLHDL 4.3 07/16/2022 11:42 AM   LDLCALC 144 (H) 07/16/2022 11:42 AM    Wt Readings from Last 3 Encounters:  12/29/23 140 lb (63.5 kg)  12/14/23 140 lb (63.5 kg)  08/19/23 135 lb 12.8 oz (61.6 kg)     Objective:    Vital Signs:  There were no vitals taken for this visit.   VITAL SIGNS:  reviewed GEN:  no acute distress EYES:  sclerae anicteric, EOMI - Extraocular Movements Intact RESPIRATORY:  normal respiratory effort, symmetric expansion NEURO:  alert and oriented x 3 PSYCH:  normal affect Skin: Crusting vesicles over left side of neck.  ASSESSMENT & PLAN:    Herpes zoster without complication Urgent care chart reviewed Continue valacyclovir  and oral prednisone  as prescribed She has stopped taking  gabapentin  as it was affecting her mobility If she has persistent neuropathic pain, can consider Lyrica  Irritable bowel syndrome with diarrhea Reports chronic, intermittent diarrhea Likely related to IBS Advised to keep food diary Advised to take loperamide as needed for diarrhea Maintain adequate hydration If persistent symptoms, can give trial of Bentyl  Acute gastritis without hemorrhage Epigastric pain likely due to gastritis, induced by oral steroid Pantoprazole  40 mg once daily, advised to take it while taking oral steroids Avoid hot and spicy food    I discussed the assessment and treatment plan with the patient. The patient was provided an opportunity to ask questions, and all were answered. The patient agreed with the plan and demonstrated an understanding of the instructions.   The patient was advised to call back or seek an in-person evaluation if the symptoms worsen or if the condition fails  to improve as anticipated.  The above assessment and management plan was discussed with the patient. The patient verbalized understanding of and has agreed to the management plan.   Medication Adjustments/Labs and Tests Ordered: Current medicines are reviewed at length with the patient today.  Concerns regarding medicines are outlined above.   Tests Ordered: No orders of the defined types were placed in this encounter.   Medication Changes: Meds ordered this encounter  Medications   pantoprazole  (PROTONIX ) 40 MG tablet    Sig: Take 1 tablet (40 mg total) by mouth daily.    Dispense:  30 tablet    Refill:  1     Note: This dictation was prepared with Dragon dictation along with smaller phrase technology. Similar sounding words can be transcribed inadequately or may not be corrected upon review. Any transcriptional errors that result from this process are unintentional.      Disposition:  Follow up  Signed, Suzzane MARLA Blanch, MD  01/11/2024 10:43 AM     [facility]

## 2024-01-11 NOTE — Assessment & Plan Note (Signed)
 Urgent care chart reviewed Continue valacyclovir  and oral prednisone  as prescribed She has stopped taking gabapentin  as it was affecting her mobility If she has persistent neuropathic pain, can consider Lyrica

## 2024-01-11 NOTE — Assessment & Plan Note (Addendum)
 Reports chronic, intermittent diarrhea Likely related to IBS Advised to keep food diary Advised to take loperamide as needed for diarrhea Maintain adequate hydration If persistent symptoms, can give trial of Bentyl

## 2024-01-19 ENCOUNTER — Ambulatory Visit: Payer: Medicare Other | Admitting: Neurology

## 2024-01-24 ENCOUNTER — Other Ambulatory Visit: Payer: Self-pay | Admitting: Internal Medicine

## 2024-01-24 ENCOUNTER — Encounter: Payer: Self-pay | Admitting: Internal Medicine

## 2024-01-24 DIAGNOSIS — M792 Neuralgia and neuritis, unspecified: Secondary | ICD-10-CM

## 2024-01-24 MED ORDER — PREGABALIN 25 MG PO CAPS: 25.0000 mg | ORAL_CAPSULE | Freq: Two times a day (BID) | ORAL | 0 refills | Status: DC

## 2024-01-31 ENCOUNTER — Encounter: Payer: Self-pay | Admitting: Neurology

## 2024-02-01 ENCOUNTER — Other Ambulatory Visit: Payer: Self-pay | Admitting: Internal Medicine

## 2024-02-02 ENCOUNTER — Encounter: Payer: Self-pay | Admitting: Neurology

## 2024-02-03 NOTE — Telephone Encounter (Signed)
 Per [employer], Patient scheduled for 02/07/24 at 11 am.

## 2024-02-07 ENCOUNTER — Encounter: Payer: Self-pay | Admitting: Internal Medicine

## 2024-02-09 ENCOUNTER — Other Ambulatory Visit: Payer: Self-pay | Admitting: Neurology

## 2024-02-10 ENCOUNTER — Encounter: Payer: Self-pay | Admitting: Neurology

## 2024-02-14 NOTE — Progress Notes (Addendum)
 BH MD/PA/NP OP Progress Note  02/17/2024 10:18 AM Pamela Roman  MRN:  [id number]  Visit Diagnosis:    ICD-10-CM   1. Severe episode of recurrent major depressive disorder, without psychotic features (HCC)  F33.2 sertraline  (ZOLOFT ) 25 MG tablet    2. Multiple sclerosis (HCC)  G35       Assessment: Pamela Roman is a 68 y.o. female with a history of MDD who presented to [facility] for initial evaluation on 02/26/22 following a suicide attempt by intentional overdose on 02/10/22.   At initial evaluation patient reported that she had been struggling with depressed mood, fatigue, isolated, and hopelessness about her future leading up to the suicide attempt. She denied any prior self harm or thoughts of suicide prior to that along with and AVH, manic symptoms, paranoia, or feelings of being a burden. Of note patient has MS that has been progressing in severity which had in turn left her feeling isolated. Since discharging from the inpatient psychiatric facility patient reported feeling more hopeful, future oriented, less isolated (family in touch and visiting more), and motivated to make adjustments in her life so she can engage in the activities she enjoys. Patient denied any suicidality at time of initial evaluation and was able to engage in making a safety plan with several steps for if depression/suicidality were to progress.   Pamela Roman presents for follow-up evaluation. Today, 02/17/24, patient reports that  mood has been more depressed recently. She has had ongoing medical concerns that have been bringing her mood down. She has continued to take the Zoloft  25 mg daily and denies any adverse side effects.  We will continue on current regimen and follow-up in 6 months (08/10/24)  Psychotherapeutic interventions were used during today's session. From 10:04 AM to 10:20 AM. Therapeutic interventions included empathic listening, supportive therapy, cognitive and behavioral therapy,  motivational interviewing. Used supportive interviewing techniques to provide emotional validation. Worked on cognitive reframing techniques and unhelpful thoughts challenged as appropriate. Alternative thoughts developed with guidance. Reviewed some techniques to facilitate increased behavioral activation. Improvement was evidenced by patient's participation and identified commitment to therapy goals.   Plan: - Continue Zoloft  25 mg QD - TSH, Vit D WNL - MRI brain and spine reviewed, new demyelinating lesions in the the thoracic spine visualized - Crisis response plan discussed, along with crisis resources - Therapy/care coordination with Joanna Saporito once a week - Supportive interviewing techniques employed, patient working towards isolating less and connecting with more social supports - Follow up in 6 months  Chief Complaint:  Chief Complaint  Patient presents with   Follow-up   HPI: Pamela Roman presents reporting that she is ok, just trying to get over shingles. Its coming along but has been a difficult experience. Pamela Roman reports that she has had more illnesses's this year then she had in a long time. Luckily the MS has been well controlled with all of this. Empathic listening techniques were used and support was provided. Encouraged patient to stay more positive and not focus on the negatives.  She has been continuing to go to the MS group which has been good. Pamela Roman has started going to physical therapy as well compared to having them come to her house. She has found that she is starting to make some progress in her strength which she is happy about. Pamela Roman notes that she has not had any falls recently. Did discuss this and encouraged her to reach out to her sister upstairs or  other supports if she does fall and is not able to get up.   Her mood has been ok, not great with all this going on it has been tough. She denies it progressing to any of the thoughts of suicide. She notes that she is  here because god wants her here. Pamela Roman has also still been talking with her family and her caregiver who she is close with. There was an incident when her wheelchair broke and her husbands kids pitched in to fix while another friend bought her a new chair.  She is still taking the Sertraline  and denies adverse side effects.  Past Psychiatric History: Patient reports that she had no prior psych history until 2021. At that time she was struggling with grief and depression following the passing of her husband and was started on Zoloft  25 mg, which she continued up until she overdosed on 02/10/22. Following her inpatient admission to [facility] patient ws discharged on Zoloft  50 mg QD. She reported stopping the medication after her discharge as she was feeling better. Patient was restarted on Zoloft  25 mg for another year. She had reported continued improved mood over this time and it was decided to trial tapering off medications to see if mood remained upbeat. She denies any trials of other psychotropic medications.   Past Medical History:  Past Medical History:  Diagnosis Date   Anxiety    Depression    Phreesia 08/06/2020   Hx of migraines    Hyperglycemia    Hypothyroidism    Migraines    Multiple sclerosis (HCC)    Neuromuscular disorder [facility])     Past Surgical History:  Procedure Laterality Date   CATARACT EXTRACTION W/PHACO Left 01/01/2020   Procedure: CATARACT EXTRACTION PHACO AND INTRAOCULAR LENS PLACEMENT LEFT EYE;  Surgeon: Harrie Agent, MD;  Location: [facility];  Service: Ophthalmology;  Laterality: Left;  CDE: 3.37   CATARACT EXTRACTION W/PHACO Right 01/19/2020   Procedure: CATARACT EXTRACTION PHACO AND INTRAOCULAR LENS PLACEMENT RIGHT EYE;  Surgeon: Harrie Agent, MD;  Location: [facility];  Service: Ophthalmology;  Laterality: Right;  CDE: 3.16   TONSILLECTOMY      Family Psychiatric History: Denies  Family History:  Family History  Problem Relation Age of  Onset   Dementia Mother    Dementia Father     Social History:  Social History   Socioeconomic History   Marital status: Widowed    Spouse name: Not on file   Number of children: 0   Years of education: 67   Highest education level: Master's degree (e.g., MA, MS, MEng, MEd, MSW, MBA)  Occupational History   Not on file  Tobacco Use   Smoking status: Never    Passive exposure: Never   Smokeless tobacco: Never   Tobacco comments:    Lived with smoker  Vaping Use   Vaping status: Never Used  Substance and Sexual Activity   Alcohol use: Not Currently   Drug use: Never   Sexual activity: Not Currently    Partners: Male  Other Topics Concern   Not on file  Social History Narrative   ** Merged History Encounter ** Lives with her sister two story home stays on the first floor   Left handed   Caffeine none   Social Drivers of Health   Financial Resource Strain: Medium Risk (01/07/2024)   Overall Financial Resource Strain (CARDIA)    Difficulty of Paying Living Expenses: Somewhat hard  Food Insecurity: Food Insecurity Present (  01/07/2024)   Hunger Vital Sign    Worried About Running Out of Food in the Last Year: Sometimes true    Ran Out of Food in the Last Year: Never true  Transportation Needs: No Transportation Needs (01/07/2024)   PRAPARE - Administrator, Civil Service (Medical): No    Lack of Transportation (Non-Medical): No  Physical Activity: Inactive (01/07/2024)   Exercise Vital Sign    Days of Exercise per Week: 0 days    Minutes of Exercise per Session: Not on file  Stress: Stress Concern Present (01/07/2024)   [employer] of Occupational Health - Occupational Stress Questionnaire    Feeling of Stress: To some extent  Social Connections: Moderately Isolated (01/07/2024)   Social Connection and Isolation Panel    Frequency of Communication with Friends and Family: Once a week    Frequency of Social Gatherings with Friends and Family: Once a week     Attends Religious Services: More than 4 times per year    Active Member of [employer] or Organizations: Yes    Attends Banker Meetings: More than 4 times per year    Marital Status: Widowed    Allergies:  Allergies  Allergen Reactions   Codeine Nausea And Vomiting    Current Medications: Current Outpatient Medications  Medication Sig Dispense Refill   acetaminophen  (TYLENOL ) 325 MG tablet Take 2 tablets (650 mg total) by mouth every 6 (six) hours as needed for mild pain (pain score 1-3), fever or headache.     albuterol  (PROVENTIL ) (2.5 MG/3ML) 0.083% nebulizer solution Take 3 mLs (2.5 mg total) by nebulization every 4 (four) hours as needed for wheezing or shortness of breath. 75 mL 12   alendronate  (FOSAMAX ) 70 MG tablet TAKE 1 TABLET ONCE A WEEK WITH A FULL GLASS OF WATER ON AN EMPTY STOMACH 12 tablet 3   azelastine  (ASTELIN ) 0.1 % nasal spray Place 1 spray into both nostrils 2 (two) times daily. Use in each nostril as directed 30 mL 0   BETA CAROTENE PO Take 1 capsule by mouth in the morning. Vitamin A, unknown strength.     Cholecalciferol  (VITAMIN D3) 125 MCG (5000 UT) CAPS Take 1 capsule (5,000 Units total) by mouth daily. 30 capsule 11   Cladribine , 6 Tabs, 10 MG TBPK Take 1.5 tablets daily for 4 days, then 23-27 days later, take 1.5 tablets daily for 4 days. 12 each 0   diazepam  (VALIUM ) 5 MG tablet TAKE 1 TABLET BY MOUTH 30 TO 40 MINUTES BEFORE MRI 2 tablet 0   levothyroxine  (SYNTHROID ) 50 MCG tablet TAKE 1 TABLET DAILY BEFORE BREAKFAST 90 tablet 3   Multiple Vitamins-Minerals (ONE-A-DAY WOMENS 50 PLUS) TABS Take 1 tablet by mouth in the morning.     pantoprazole  (PROTONIX ) 40 MG tablet Take 1 tablet (40 mg total) by mouth daily. 30 tablet 1   pregabalin  (LYRICA ) 25 MG capsule Take 1 capsule (25 mg total) by mouth 2 (two) times daily. 60 capsule 3   sertraline  (ZOLOFT ) 25 MG tablet Take 1 tablet (25 mg total) by mouth daily. 90 tablet 1   simvastatin  (ZOCOR ) 20 MG  tablet TAKE 1 TABLET DAILY 90 tablet 3   SUMAtriptan  (IMITREX ) 100 MG tablet TAKE 1 TABLET EVERY 2 HOURS AS NEEDED FOR MIGRAINE. MAY REPEAT IN 2 HOURS IF HEADACHE PERSISTS OR RECURS 27 tablet 0   zonisamide  (ZONEGRAN ) 100 MG capsule TAKE 1 CAPSULE DAILY 30 capsule 3   No current facility-administered medications for this  visit.     Psychiatric Specialty Exam: Review of Systems  There were no vitals taken for this visit.There is no height or weight on file to calculate BMI.  General Appearance: Well Groomed  Eye Contact:  Good  Speech:  Clear and Coherent  Volume:  Normal  Mood:  Euthymic and dysthymic  Affect:  Congruent  Thought Process:  Coherent and Goal Directed  Orientation:  Full (Time, Place, and Person)  Thought Content: Logical   Suicidal Thoughts:  No  Homicidal Thoughts:  No  Memory:  Immediate;   Good  Judgement:  Good  Insight:  Good  Psychomotor Activity:  NA  Concentration:  Concentration: Good  Recall:  Good  Fund of Knowledge: Good  Language: Good  Akathisia:  No    AIMS (if indicated): not done  Assets:  Communication Skills Desire for Improvement  ADL's:  Intact  Cognition: WNL  Sleep:  Good   Metabolic Disorder Labs: Lab Results  Component Value Date   HGBA1C 5.7 (H) 07/16/2022   No results found for: PROLACTIN Lab Results  Component Value Date   CHOL 218 (H) 07/16/2022   TRIG 128 07/16/2022   HDL 51 07/16/2022   CHOLHDL 4.3 07/16/2022   LDLCALC 144 (H) 07/16/2022   LDLCALC 123 (H) 11/06/2020   Lab Results  Component Value Date   TSH 0.621 07/12/2023   TSH 2.190 07/16/2022    Therapeutic Level Labs: No results found for: LITHIUM No results found for: VALPROATE No results found for: CBMZ   Screenings: GAD-7    Flowsheet Row Office Visit from 02/16/2024 in [facility] Telemedicine from 01/11/2024 in [facility] Office Visit from 08/19/2023 in [facility]  Video Visit from 10/05/2022 in [facility] Office Visit from 06/19/2022 in [facility] Care  Total GAD-7 Score 0 0 0 0 0   Mini-Mental    Flowsheet Row Office Visit from 03/12/2021 in [facility]  Total Score (max 30 points ) 30   PHQ2-9    Flowsheet Row Office Visit from 02/16/2024 in [facility] Care Telemedicine from 01/11/2024 in [facility] Care Clinical Support from 12/14/2023 in [facility] Office Visit from 08/19/2023 in [facility] Clinical Support from 12/09/2022 in [facility] Primary Care  PHQ-2 Total Score 0 0 2 0 0  PHQ-9 Total Score 0 0 8 0 --   Flowsheet Row UC from 01/09/2024 in [facility] [place] UC from 12/22/2023 in [facility] ED to Hosp-Admission (Discharged) from 07/12/2023 in [facility] MEDICAL SURGICAL UNIT  C-SSRS RISK CATEGORY No Risk No Risk No Risk    Collaboration of Care: Collaboration of Care: Medication Management AEB medication presciption, Primary Care Provider AEB chart review, and Other provider involved in patient's care AEB urgent care and neuro chart review  Patient/Guardian was advised Release of Information must be obtained prior to any record release in order to collaborate their care with an outside provider. Patient/Guardian was advised if they have not already done so to contact the registration department to sign all necessary forms in order for us  to release information regarding their care.   Consent: Patient/Guardian gives verbal consent for treatment and assignment of benefits for services provided during this visit. Patient/Guardian expressed understanding and agreed to proceed.    Arvella CHRISTELLA Finder, MD 02/17/2024, 10:18  AM   Virtual Visit via Video Note  I connected with Pamela Roman on 02/17/24 at 10:00 AM EDT by a video enabled telemedicine  application and verified that I am speaking with the correct person using two identifiers.  Location: Patient: Home Provider: Home Office   I discussed the limitations of evaluation and management by telemedicine and the availability of in person appointments. The patient expressed understanding and agreed to proceed.   I discussed the assessment and treatment plan with the patient. The patient was provided an opportunity to ask questions and all were answered. The patient agreed with the plan and demonstrated an understanding of the instructions.   The patient was advised to call back or seek an in-person evaluation if the symptoms worsen or if the condition fails to improve as anticipated.  I provided 20 minutes of non-face-to-face time during this encounter.   Arvella CHRISTELLA Finder, MD

## 2024-02-16 ENCOUNTER — Encounter: Payer: Self-pay | Admitting: Internal Medicine

## 2024-02-16 ENCOUNTER — Ambulatory Visit: Payer: Medicare Other | Admitting: Internal Medicine

## 2024-02-16 VITALS — BP 126/80 | HR 72 | Ht 60.0 in

## 2024-02-16 DIAGNOSIS — G35 Multiple sclerosis: Secondary | ICD-10-CM

## 2024-02-16 DIAGNOSIS — B0229 Other postherpetic nervous system involvement: Secondary | ICD-10-CM | POA: Insufficient documentation

## 2024-02-16 DIAGNOSIS — E039 Hypothyroidism, unspecified: Secondary | ICD-10-CM

## 2024-02-16 DIAGNOSIS — M810 Age-related osteoporosis without current pathological fracture: Secondary | ICD-10-CM

## 2024-02-16 DIAGNOSIS — G43809 Other migraine, not intractable, without status migrainosus: Secondary | ICD-10-CM

## 2024-02-16 DIAGNOSIS — F331 Major depressive disorder, recurrent, moderate: Secondary | ICD-10-CM | POA: Diagnosis not present

## 2024-02-16 MED ORDER — PREGABALIN 25 MG PO CAPS: 25.0000 mg | ORAL_CAPSULE | Freq: Two times a day (BID) | ORAL | 3 refills | Status: DC

## 2024-02-16 NOTE — Assessment & Plan Note (Signed)
 Well controlled with Zoloft  25 mg QD Had suicidal attempt with Zoloft  overdose in 2023 Was admitted at [facility] facility Followed by Psychiatry

## 2024-02-16 NOTE — Patient Instructions (Signed)
 Please continue taking Lyrica  for neuropathic pain.  Please continue to take medications as prescribed.  Please continue to eat at regular intervals and maintain at least 50 ounces of fluid intake in a day.  Please consider getting Shingrix and Tdap vaccine at local pharmacy.

## 2024-02-16 NOTE — Assessment & Plan Note (Signed)
 Has Sumatriptan  PRN On Zonisamide  for ppx Follows up with Neurologist

## 2024-02-16 NOTE — Assessment & Plan Note (Addendum)
 Completed Mavenclad  (finished second cycle)  Progressive neurologic deficits Has paresthesia in the legs, physical deconditioning leading to wheelchair use Undergoing PT Follows up with Neurologist - last visit note reviewed

## 2024-02-16 NOTE — Assessment & Plan Note (Signed)
 On Alendronate  Vitamin D  2000 IU QD

## 2024-02-16 NOTE — Assessment & Plan Note (Signed)
 Shingles rash has improved now, but still has burning in the neck and upper chest wall area Did not tolerate gabapentin  Started Lyrica  25 mg BID - tolerates it well, continue for now

## 2024-02-16 NOTE — Assessment & Plan Note (Addendum)
 Lab Results  Component Value Date   TSH 0.621 07/12/2023   On Levothyroxine  50 mcg QD Check TSH and free T4 in the next visit

## 2024-02-16 NOTE — Progress Notes (Signed)
 Established Patient Office Visit  Subjective:  Patient ID: Pamela Roman, female    DOB: Jul 23, 1955  Age: 68 y.o. MRN: [id number]  CC:  Chief Complaint  Patient presents with   Medical Management of Chronic Issues    6 month f/u     HPI Pamela Roman is a 68 y.o. female with past medical history of progressive multiple sclerosis, physical deconditioning, osteoporosis, multiple falls, hypothyroidism and depression who presents for f/u of her chronic medical conditions.  MS: She used to take Mayzent  for MS and follows up with neurologist. She reports having multiple falls in the past. She complains of recent worsening of fatigue and leg weakness. She has difficulty ambulating in her home due to progressive leg weakness.  She has a powered wheelchair for better mobility and to reduce her risk of recurrent falls.  She is undergoing PT for better mobility.  Hypothyroidism: She takes levothyroxine  50 mcg QD.  She denies any recent change in weight or appetite.  MDD: She takes Zoloft  25 mg daily.  Followed by psychiatry now.  Denies any anhedonia, SI or HI currently.  Postherpetic neuralgia: She had shingles infection about a month ago.  Her rash around her left side neck and upper chest wall have improved now, but still have neuropathic pain in the area.  She initially tried taking gabapentin , which caused worsening of leg weakness.  She has tried Lyrica  recently, which has improved her symptoms adequately without major side effects.   Past Medical History:  Diagnosis Date   Anxiety    Depression    Phreesia 08/06/2020   Hx of migraines    Hyperglycemia    Hypothyroidism    Migraines    Multiple sclerosis (HCC)    Neuromuscular disorder [facility])     Past Surgical History:  Procedure Laterality Date   CATARACT EXTRACTION W/PHACO Left 01/01/2020   Procedure: CATARACT EXTRACTION PHACO AND INTRAOCULAR LENS PLACEMENT LEFT EYE;  Surgeon: Harrie Agent, MD;  Location: [facility];  Service:  Ophthalmology;  Laterality: Left;  CDE: 3.37   CATARACT EXTRACTION W/PHACO Right 01/19/2020   Procedure: CATARACT EXTRACTION PHACO AND INTRAOCULAR LENS PLACEMENT RIGHT EYE;  Surgeon: Harrie Agent, MD;  Location: [facility];  Service: Ophthalmology;  Laterality: Right;  CDE: 3.16   TONSILLECTOMY      Family History  Problem Relation Age of Onset   Dementia Mother    Dementia Father     Social History   Socioeconomic History   Marital status: Widowed    Spouse name: Not on file   Number of children: 0   Years of education: 26   Highest education level: Master's degree (e.g., MA, MS, MEng, MEd, MSW, MBA)  Occupational History   Not on file  Tobacco Use   Smoking status: Never    Passive exposure: Never   Smokeless tobacco: Never   Tobacco comments:    Lived with smoker  Vaping Use   Vaping status: Never Used  Substance and Sexual Activity   Alcohol use: Not Currently   Drug use: Never   Sexual activity: Not Currently    Partners: Male  Other Topics Concern   Not on file  Social History Narrative   ** Merged History Encounter ** Lives with her sister two story home stays on the first floor   Left handed   Caffeine none   Social Drivers of Health   Financial Resource Strain: Medium Risk (01/07/2024)   Overall Financial Resource Strain (CARDIA)  Difficulty of Paying Living Expenses: Somewhat hard  Food Insecurity: Food Insecurity Present (01/07/2024)   Hunger Vital Sign    Worried About Running Out of Food in the Last Year: Sometimes true    Ran Out of Food in the Last Year: Never true  Transportation Needs: No Transportation Needs (01/07/2024)   PRAPARE - Administrator, Civil Service (Medical): No    Lack of Transportation (Non-Medical): No  Physical Activity: Inactive (01/07/2024)   Exercise Vital Sign    Days of Exercise per Week: 0 days    Minutes of Exercise per Session: Not on file  Stress: Stress Concern Present (01/07/2024)   [employer] of  Occupational Health - Occupational Stress Questionnaire    Feeling of Stress: To some extent  Social Connections: Moderately Isolated (01/07/2024)   Social Connection and Isolation Panel    Frequency of Communication with Friends and Family: Once a week    Frequency of Social Gatherings with Friends and Family: Once a week    Attends Religious Services: More than 4 times per year    Active Member of [employer] or Organizations: Yes    Attends Banker Meetings: More than 4 times per year    Marital Status: Widowed  Intimate Partner Violence: Not At Risk (12/14/2023)   Humiliation, Afraid, Rape, and Kick questionnaire    Fear of Current or Ex-Partner: No    Emotionally Abused: No    Physically Abused: No    Sexually Abused: No    Outpatient Medications Prior to Visit  Medication Sig Dispense Refill   acetaminophen  (TYLENOL ) 325 MG tablet Take 2 tablets (650 mg total) by mouth every 6 (six) hours as needed for mild pain (pain score 1-3), fever or headache.     albuterol  (PROVENTIL ) (2.5 MG/3ML) 0.083% nebulizer solution Take 3 mLs (2.5 mg total) by nebulization every 4 (four) hours as needed for wheezing or shortness of breath. 75 mL 12   alendronate  (FOSAMAX ) 70 MG tablet TAKE 1 TABLET ONCE A WEEK WITH A FULL GLASS OF WATER ON AN EMPTY STOMACH 12 tablet 3   azelastine  (ASTELIN ) 0.1 % nasal spray Place 1 spray into both nostrils 2 (two) times daily. Use in each nostril as directed 30 mL 0   BETA CAROTENE PO Take 1 capsule by mouth in the morning. Vitamin A, unknown strength.     Cholecalciferol  (VITAMIN D3) 125 MCG (5000 UT) CAPS Take 1 capsule (5,000 Units total) by mouth daily. 30 capsule 11   Cladribine , 6 Tabs, 10 MG TBPK Take 1.5 tablets daily for 4 days, then 23-27 days later, take 1.5 tablets daily for 4 days. 12 each 0   diazepam  (VALIUM ) 5 MG tablet TAKE 1 TABLET BY MOUTH 30 TO 40 MINUTES BEFORE MRI 2 tablet 0   levothyroxine  (SYNTHROID ) 50 MCG tablet TAKE 1 TABLET DAILY BEFORE  BREAKFAST 90 tablet 3   Multiple Vitamins-Minerals (ONE-A-DAY WOMENS 50 PLUS) TABS Take 1 tablet by mouth in the morning.     pantoprazole  (PROTONIX ) 40 MG tablet Take 1 tablet (40 mg total) by mouth daily. 30 tablet 1   sertraline  (ZOLOFT ) 25 MG tablet Take 1 tablet (25 mg total) by mouth daily. 90 tablet 1   simvastatin  (ZOCOR ) 20 MG tablet TAKE 1 TABLET DAILY 90 tablet 3   SUMAtriptan  (IMITREX ) 100 MG tablet TAKE 1 TABLET EVERY 2 HOURS AS NEEDED FOR MIGRAINE. MAY REPEAT IN 2 HOURS IF HEADACHE PERSISTS OR RECURS 27 tablet 0   zonisamide  (  ZONEGRAN ) 100 MG capsule TAKE 1 CAPSULE DAILY 30 capsule 3   pregabalin  (LYRICA ) 25 MG capsule Take 1 capsule (25 mg total) by mouth 2 (two) times daily. 60 capsule 0   gabapentin  (NEURONTIN ) 100 MG capsule Take 1 capsule (100 mg total) by mouth 3 (three) times daily. (Patient taking differently: Take 100 mg by mouth 3 (three) times daily. Per Patient as needed) 270 capsule 1   valACYclovir  (VALTREX ) 1000 MG tablet Take 1 tablet (1,000 mg total) by mouth 3 (three) times daily. 21 tablet 0   No facility-administered medications prior to visit.    Allergies  Allergen Reactions   Codeine Nausea And Vomiting    ROS Review of Systems  Constitutional:  Negative for chills and fever.  HENT:  Negative for congestion, sinus pain, sore throat and voice change.   Eyes:  Positive for visual disturbance. Negative for pain.  Respiratory:  Negative for cough and shortness of breath.   Cardiovascular:  Negative for chest pain and palpitations.  Gastrointestinal:  Negative for constipation, diarrhea, nausea and vomiting.  Endocrine: Negative for polydipsia and polyuria.  Genitourinary:  Negative for dysuria and hematuria.  Musculoskeletal:  Positive for arthralgias, back pain and gait problem. Negative for neck pain and neck stiffness.  Skin:  Negative for rash.  Neurological:  Positive for weakness and numbness. Negative for dizziness.  Psychiatric/Behavioral:   Negative for agitation and behavioral problems.       Objective:    Physical Exam Vitals reviewed.  Constitutional:      General: She is not in acute distress.    Appearance: She is not diaphoretic.     Comments: In wheelchair  HENT:     Head: Normocephalic and atraumatic.     Nose: Nose normal.     Mouth/Throat:     Mouth: Mucous membranes are moist.  Eyes:     General: No scleral icterus.    Extraocular Movements: Extraocular movements intact.  Cardiovascular:     Rate and Rhythm: Normal rate and regular rhythm.     Heart sounds: Normal heart sounds. No murmur heard. Pulmonary:     Breath sounds: Normal breath sounds. No wheezing or rales.  Musculoskeletal:     Cervical back: Neck supple. No tenderness.     Right lower leg: No edema.     Left lower leg: No edema.  Skin:    General: Skin is warm.     Findings: Rash (Erythematous patchy over the left side of neck and upper chest wall area - shingles rash improved now) present.  Neurological:     General: No focal deficit present.     Mental Status: She is alert and oriented to person, place, and time.     Sensory: Sensory deficit (B/l LE) present.     Motor: Weakness (B/l UE and LE - 3/5) present.  Psychiatric:        Mood and Affect: Mood normal.        Behavior: Behavior normal.     BP 126/80   Pulse 72   Ht 5' (1.524 m)   SpO2 93%   BMI 27.34 kg/m  Wt Readings from Last 3 Encounters:  12/29/23 140 lb (63.5 kg)  12/14/23 140 lb (63.5 kg)  08/19/23 135 lb 12.8 oz (61.6 kg)    Lab Results  Component Value Date   TSH 0.621 07/12/2023   Lab Results  Component Value Date   WBC 7.2 07/12/2023   HGB 14.2 07/12/2023   HCT  43.7 07/12/2023   MCV 92.6 07/12/2023   PLT 173 07/12/2023   Lab Results  Component Value Date   NA 139 07/18/2023   K 3.6 07/18/2023   CO2 22 07/18/2023   GLUCOSE 90 07/18/2023   BUN 23 07/18/2023   CREATININE 0.56 07/19/2023   BILITOT 0.8 07/12/2023   ALKPHOS 79 07/12/2023    AST 22 07/12/2023   ALT 15 07/12/2023   PROT 7.0 07/12/2023   ALBUMIN 4.0 07/12/2023   CALCIUM 8.4 (L) 07/18/2023   ANIONGAP 8 07/18/2023   EGFR 94 07/16/2022   GFR 80.58 07/20/2022   Lab Results  Component Value Date   CHOL 218 (H) 07/16/2022   Lab Results  Component Value Date   HDL 51 07/16/2022   Lab Results  Component Value Date   LDLCALC 144 (H) 07/16/2022   Lab Results  Component Value Date   TRIG 128 07/16/2022   Lab Results  Component Value Date   CHOLHDL 4.3 07/16/2022   Lab Results  Component Value Date   HGBA1C 5.7 (H) 07/16/2022      Assessment & Plan:   Problem List Items Addressed This Visit       Cardiovascular and Mediastinum   Migraine   Has Sumatriptan  PRN On Zonisamide  for ppx Follows up with Neurologist      Relevant Medications   pregabalin  (LYRICA ) 25 MG capsule     Endocrine   Hypothyroidism - Primary   Lab Results  Component Value Date   TSH 0.621 07/12/2023   On Levothyroxine  50 mcg QD Check TSH and free T4 in the next visit        Nervous and Auditory   Multiple sclerosis (HCC) (Chronic)   Completed [employer]  (finished second cycle)  Progressive neurologic deficits Has paresthesia in the legs, physical deconditioning leading to wheelchair use Undergoing PT Follows up with Neurologist - last visit note reviewed      Postherpetic neuralgia   Shingles rash has improved now, but still has burning in the neck and upper chest wall area Did not tolerate gabapentin  Started Lyrica  25 mg BID - tolerates it well, continue for now      Relevant Medications   pregabalin  (LYRICA ) 25 MG capsule     Musculoskeletal and Integument   Osteoporosis   On Alendronate  Vitamin D  2000 IU QD        Other   MDD (major depressive disorder), recurrent episode, moderate (HCC)   Well controlled with Zoloft  25 mg QD Had suicidal attempt with Zoloft  overdose in 2023 Was admitted at [facility] behavioral health facility Followed by  Psychiatry         Meds ordered this encounter  Medications   pregabalin  (LYRICA ) 25 MG capsule    Sig: Take 1 capsule (25 mg total) by mouth 2 (two) times daily.    Dispense:  60 capsule    Refill:  3    Follow-up: Return in about 6 months (around 08/18/2024).    Suzzane MARLA Blanch, MD

## 2024-02-17 ENCOUNTER — Telehealth (HOSPITAL_COMMUNITY): Payer: Medicare Other | Admitting: Psychiatry

## 2024-02-17 ENCOUNTER — Encounter (HOSPITAL_COMMUNITY): Payer: Self-pay | Admitting: Psychiatry

## 2024-02-17 DIAGNOSIS — G35 Multiple sclerosis: Secondary | ICD-10-CM | POA: Diagnosis not present

## 2024-02-17 DIAGNOSIS — F332 Major depressive disorder, recurrent severe without psychotic features: Secondary | ICD-10-CM | POA: Diagnosis not present

## 2024-02-17 MED ORDER — SERTRALINE HCL 25 MG PO TABS: 25.0000 mg | ORAL_TABLET | Freq: Every day | ORAL | 1 refills | Status: DC

## 2024-02-23 ENCOUNTER — Ambulatory Visit (HOSPITAL_COMMUNITY)
Admission: RE | Admit: 2024-02-23 | Discharge: 2024-02-23 | Disposition: A | Discharge status: Home / Self Care | Source: Ambulatory Visit | Attending: Internal Medicine | Admitting: Internal Medicine

## 2024-02-23 DIAGNOSIS — Z1231 Encounter for screening mammogram for malignant neoplasm of breast: Secondary | ICD-10-CM | POA: Diagnosis present

## 2024-02-23 DIAGNOSIS — Z780 Asymptomatic menopausal state: Secondary | ICD-10-CM | POA: Insufficient documentation

## 2024-02-24 ENCOUNTER — Ambulatory Visit: Payer: Self-pay | Admitting: Internal Medicine

## 2024-03-14 ENCOUNTER — Ambulatory Visit: Payer: Self-pay

## 2024-03-14 NOTE — Telephone Encounter (Signed)
 Patient scheduled.

## 2024-03-14 NOTE — Telephone Encounter (Signed)
 FYI Only or Action Required?: FYI only for provider.  Patient was last seen in primary care on 02/16/2024 by Tobie Suzzane POUR, MD.  Called Nurse Triage reporting Rash.  Symptoms began 2 days ago.  Interventions attempted: Other: patient states she has been using a cream that she was provided when she had shingles in July.  Symptoms are: spreading red blotchy rash on face and down left side of neck and shoulder, moderate itchiness gradually worsening.  Triage Disposition: See Physician Within 24 Hours  Patient/caregiver understands and will follow disposition?: Yes            Copied from CRM #[id number]. Topic: Clinical - Red Word Triage >> Mar 14, 2024  8:47 AM Selinda RAMAN wrote: Red Word that prompted transfer to Nurse Triage: The patient called in stating she is breaking out all over her face and she is in discomfort. She states she had a bout with the Shingles a few months back and doesn't know if it maybe related to that. I will transfer her to E2C2 NT Reason for Disposition  [1] Looks infected (e.g., spreading redness, pus) AND [2] no fever  Answer Assessment - Initial Assessment Questions 1. APPEARANCE of RASH: What does the rash look like?      Left side of face, red patch with white blisters. Blotchy red patches on right side of face and around ear going down neck.  2. LOCATION: Where is the rash located?      Face (both sides and now going down left side of neck and left shoulder).  3. ONSET: When did the rash start?      Gradually worsening since Sunday 03/12/24.  4. ITCHING: Does the rash itch? If Yes, ask: How bad is the itch?  (Scale 1-10; or mild, moderate, severe)     Yes. Moderate.  5. PAIN: Does the rash hurt? If Yes, ask: How bad is the pain?  (Scale 0-10; or none, mild, moderate, severe)     No.  6. OTHER SYMPTOMS: Do you have any other symptoms? (e.g., fever)     Patient denies any recent exposure to poison ivy/oak, new medications or changes  to facial skin care. Denies fever, difficulty breathing, difficulty swallowing. No additional symptoms.  7. PREGNANCY: Is there any chance you are pregnant? When was your last menstrual period?     N/A.  Protocols used: Shingles (Zoster)-A-AH, Rash or Redness - Localized-A-AH

## 2024-03-15 ENCOUNTER — Encounter: Payer: Self-pay | Admitting: Internal Medicine

## 2024-03-15 ENCOUNTER — Ambulatory Visit (INDEPENDENT_AMBULATORY_CARE_PROVIDER_SITE_OTHER): Admitting: Internal Medicine

## 2024-03-15 VITALS — BP 109/74 | HR 87

## 2024-03-15 DIAGNOSIS — Z23 Encounter for immunization: Secondary | ICD-10-CM

## 2024-03-15 DIAGNOSIS — L239 Allergic contact dermatitis, unspecified cause: Secondary | ICD-10-CM | POA: Diagnosis not present

## 2024-03-15 MED ORDER — FLUOCINOLONE ACETONIDE 0.01 % EX CREA: TOPICAL_CREAM | Freq: Two times a day (BID) | CUTANEOUS | 0 refills | Status: DC

## 2024-03-15 MED ORDER — SPIKEVAX 50 MCG/0.5ML IM SUSY: 0.5000 mL | PREFILLED_SYRINGE | Freq: Once | INTRAMUSCULAR | 0 refills | Status: AC

## 2024-03-15 NOTE — Assessment & Plan Note (Signed)
 Bilateral face area rash likely due to allergic dermatitis, advised to avoid applying herbal ointment/cream to the face area Fluocinolone  cream prescribed for allergic dermatitis

## 2024-03-15 NOTE — Progress Notes (Signed)
 Acute Office Visit  Subjective:    Patient ID: Pamela Roman, female    DOB: 1955/09/28, 68 y.o.   MRN: [id number]  Chief Complaint  Patient presents with   Herpes Zoster    Patient states she has had shingles since July, it is along shoulder , neck and face    HPI Patient is in today for complaint of a facial rash on bilateral cheek area and forehead area for the last 2 days.  Denies any facial bumps.  Denies any fever or chills.  She had shingles in 07/25, and has been applying a topical herbal cream over the shoulder, neck and face area.  Denies any other new chemical exposure.  Past Medical History:  Diagnosis Date   Anxiety    Depression    Phreesia 08/06/2020   Hx of migraines    Hyperglycemia    Hypothyroidism    Migraines    Multiple sclerosis (HCC)    Neuromuscular disorder [facility])     Past Surgical History:  Procedure Laterality Date   CATARACT EXTRACTION W/PHACO Left 01/01/2020   Procedure: CATARACT EXTRACTION PHACO AND INTRAOCULAR LENS PLACEMENT LEFT EYE;  Surgeon: Harrie Agent, MD;  Location: [facility];  Service: Ophthalmology;  Laterality: Left;  CDE: 3.37   CATARACT EXTRACTION W/PHACO Right 01/19/2020   Procedure: CATARACT EXTRACTION PHACO AND INTRAOCULAR LENS PLACEMENT RIGHT EYE;  Surgeon: Harrie Agent, MD;  Location: [facility];  Service: Ophthalmology;  Laterality: Right;  CDE: 3.16   TONSILLECTOMY      Family History  Problem Relation Age of Onset   Dementia Mother    Dementia Father     Social History   Socioeconomic History   Marital status: Widowed    Spouse name: Not on file   Number of children: 0   Years of education: 2   Highest education level: Master's degree (e.g., MA, MS, MEng, MEd, MSW, MBA)  Occupational History   Not on file  Tobacco Use   Smoking status: Never    Passive exposure: Never   Smokeless tobacco: Never   Tobacco comments:    Lived with smoker  Vaping Use   Vaping status: Never Used  Substance and Sexual Activity    Alcohol use: Not Currently   Drug use: Never   Sexual activity: Not Currently    Partners: Male  Other Topics Concern   Not on file  Social History Narrative   ** Merged History Encounter ** Lives with her sister two story home stays on the first floor   Left handed   Caffeine none   Social Drivers of Health   Financial Resource Strain: Medium Risk (01/07/2024)   Overall Financial Resource Strain (CARDIA)    Difficulty of Paying Living Expenses: Somewhat hard  Food Insecurity: Food Insecurity Present (01/07/2024)   Hunger Vital Sign    Worried About Running Out of Food in the Last Year: Sometimes true    Ran Out of Food in the Last Year: Never true  Transportation Needs: No Transportation Needs (01/07/2024)   PRAPARE - Administrator, Civil Service (Medical): No    Lack of Transportation (Non-Medical): No  Physical Activity: Inactive (01/07/2024)   Exercise Vital Sign    Days of Exercise per Week: 0 days    Minutes of Exercise per Session: Not on file  Stress: Stress Concern Present (01/07/2024)   [employer] of Occupational Health - Occupational Stress Questionnaire    Feeling of Stress: To some extent  Social  Connections: Moderately Isolated (01/07/2024)   Social Connection and Isolation Panel    Frequency of Communication with Friends and Family: Once a week    Frequency of Social Gatherings with Friends and Family: Once a week    Attends Religious Services: More than 4 times per year    Active Member of [employer] or Organizations: Yes    Attends Banker Meetings: More than 4 times per year    Marital Status: Widowed  Intimate Partner Violence: Not At Risk (12/14/2023)   Humiliation, Afraid, Rape, and Kick questionnaire    Fear of Current or Ex-Partner: No    Emotionally Abused: No    Physically Abused: No    Sexually Abused: No    Outpatient Medications Prior to Visit  Medication Sig Dispense Refill   acetaminophen  (TYLENOL ) 325 MG tablet Take 2  tablets (650 mg total) by mouth every 6 (six) hours as needed for mild pain (pain score 1-3), fever or headache.     albuterol  (PROVENTIL ) (2.5 MG/3ML) 0.083% nebulizer solution Take 3 mLs (2.5 mg total) by nebulization every 4 (four) hours as needed for wheezing or shortness of breath. 75 mL 12   alendronate  (FOSAMAX ) 70 MG tablet TAKE 1 TABLET ONCE A WEEK WITH A FULL GLASS OF WATER ON AN EMPTY STOMACH 12 tablet 3   azelastine  (ASTELIN ) 0.1 % nasal spray Place 1 spray into both nostrils 2 (two) times daily. Use in each nostril as directed 30 mL 0   BETA CAROTENE PO Take 1 capsule by mouth in the morning. Vitamin A, unknown strength.     Cholecalciferol  (VITAMIN D3) 125 MCG (5000 UT) CAPS Take 1 capsule (5,000 Units total) by mouth daily. 30 capsule 11   Cladribine , 6 Tabs, 10 MG TBPK Take 1.5 tablets daily for 4 days, then 23-27 days later, take 1.5 tablets daily for 4 days. 12 each 0   diazepam  (VALIUM ) 5 MG tablet TAKE 1 TABLET BY MOUTH 30 TO 40 MINUTES BEFORE MRI 2 tablet 0   levothyroxine  (SYNTHROID ) 50 MCG tablet TAKE 1 TABLET DAILY BEFORE BREAKFAST 90 tablet 3   Multiple Vitamins-Minerals (ONE-A-DAY WOMENS 50 PLUS) TABS Take 1 tablet by mouth in the morning.     pantoprazole  (PROTONIX ) 40 MG tablet Take 1 tablet (40 mg total) by mouth daily. 30 tablet 1   pregabalin  (LYRICA ) 25 MG capsule Take 1 capsule (25 mg total) by mouth 2 (two) times daily. 60 capsule 3   sertraline  (ZOLOFT ) 25 MG tablet Take 1 tablet (25 mg total) by mouth daily. 90 tablet 1   simvastatin  (ZOCOR ) 20 MG tablet TAKE 1 TABLET DAILY 90 tablet 3   SUMAtriptan  (IMITREX ) 100 MG tablet TAKE 1 TABLET EVERY 2 HOURS AS NEEDED FOR MIGRAINE. MAY REPEAT IN 2 HOURS IF HEADACHE PERSISTS OR RECURS 27 tablet 0   zonisamide  (ZONEGRAN ) 100 MG capsule TAKE 1 CAPSULE DAILY 30 capsule 3   No facility-administered medications prior to visit.    Allergies  Allergen Reactions   Codeine Nausea And Vomiting    Review of Systems   Constitutional:  Negative for chills and fever.  HENT:  Negative for congestion, sinus pain, sore throat and voice change.   Eyes:  Positive for visual disturbance. Negative for pain.  Respiratory:  Negative for cough and shortness of breath.   Cardiovascular:  Negative for chest pain and palpitations.  Gastrointestinal:  Negative for constipation, diarrhea, nausea and vomiting.  Endocrine: Negative for polydipsia and polyuria.  Genitourinary:  Negative for  dysuria and hematuria.  Musculoskeletal:  Positive for arthralgias, back pain and gait problem. Negative for neck pain and neck stiffness.  Skin:  Positive for rash.  Neurological:  Positive for weakness and numbness. Negative for dizziness.  Psychiatric/Behavioral:  Negative for agitation and behavioral problems.        Objective:    Physical Exam Vitals reviewed.  Constitutional:      General: She is not in acute distress.    Appearance: She is not diaphoretic.     Comments: In wheelchair  HENT:     Head: Normocephalic and atraumatic.     Nose: Nose normal.     Mouth/Throat:     Mouth: Mucous membranes are moist.  Eyes:     General: No scleral icterus.    Extraocular Movements: Extraocular movements intact.  Cardiovascular:     Rate and Rhythm: Normal rate and regular rhythm.     Heart sounds: Normal heart sounds. No murmur heard. Pulmonary:     Breath sounds: Normal breath sounds. No wheezing or rales.  Musculoskeletal:     Cervical back: Neck supple. No tenderness.     Right lower leg: No edema.     Left lower leg: No edema.  Skin:    General: Skin is warm.     Findings: Rash (Erythema over bilateral face area) present.     Comments: Shingles rash over left shoulder and neck area improved now  Neurological:     General: No focal deficit present.     Mental Status: She is alert and oriented to person, place, and time.     Sensory: Sensory deficit (B/l LE) present.     Motor: Weakness (B/l UE and LE - 3/5)  present.  Psychiatric:        Mood and Affect: Mood normal.        Behavior: Behavior normal.     BP 109/74   Pulse 87   SpO2 98%  Wt Readings from Last 3 Encounters:  12/29/23 140 lb (63.5 kg)  12/14/23 140 lb (63.5 kg)  08/19/23 135 lb 12.8 oz (61.6 kg)        Assessment & Plan:   Problem List Items Addressed This Visit       Musculoskeletal and Integument   Allergic dermatitis - Primary   Bilateral face area rash likely due to allergic dermatitis, advised to avoid applying herbal ointment/cream to the face area Fluocinolone  cream prescribed for allergic dermatitis      Relevant Medications   fluocinolone  0.01 % cream   Other Visit Diagnoses       Need for COVID-19 vaccine       Relevant Medications   COVID-19 mRNA vaccine (SPIKEVAX ) syringe     Encounter for immunization       Relevant Orders   Flu vaccine HIGH DOSE PF(Fluzone Trivalent) (Completed)        Meds ordered this encounter  Medications   COVID-19 mRNA vaccine (SPIKEVAX ) syringe    Sig: Inject 0.5 mLs into the muscle once for 1 dose.    Dispense:  0.5 mL    Refill:  0   fluocinolone  0.01 % cream    Sig: Apply topically 2 (two) times daily.    Dispense:  30 g    Refill:  0     Caelin Rosen MARLA Blanch, MD

## 2024-03-15 NOTE — Patient Instructions (Signed)
 Please apply Fluocinolone  cream over facial rash area.  Please avoid other topical cream over face area.

## 2024-04-01 ENCOUNTER — Ambulatory Visit: Payer: Self-pay

## 2024-04-05 ENCOUNTER — Encounter: Payer: Self-pay | Admitting: Neurology

## 2024-04-06 MED ORDER — ZONISAMIDE 100 MG PO CAPS: 100.0000 mg | ORAL_CAPSULE | Freq: Every day | ORAL | 2 refills | Status: AC

## 2024-04-06 MED ORDER — ZONISAMIDE 100 MG PO CAPS: 100.0000 mg | ORAL_CAPSULE | Freq: Every day | ORAL | 2 refills | Status: DC

## 2024-04-19 ENCOUNTER — Telehealth: Payer: Self-pay | Admitting: Neurology

## 2024-04-19 NOTE — Telephone Encounter (Signed)
 Pt called in this morning, Pt stated that the physical therapy is really working out for her at [facility] in [place]. Thanks

## 2024-06-27 ENCOUNTER — Encounter (HOSPITAL_COMMUNITY): Payer: Self-pay

## 2024-06-27 ENCOUNTER — Emergency Department (HOSPITAL_COMMUNITY)

## 2024-06-27 ENCOUNTER — Telehealth: Payer: Self-pay | Admitting: Neurology

## 2024-06-27 ENCOUNTER — Inpatient Hospital Stay (HOSPITAL_COMMUNITY)
Admission: EM | Admit: 2024-06-27 | Discharge: 2024-07-03 | DRG: 060 | Disposition: A | Discharge status: Skilled Nursing Facility | Attending: Internal Medicine | Admitting: Internal Medicine

## 2024-06-27 DIAGNOSIS — K219 Gastro-esophageal reflux disease without esophagitis: Secondary | ICD-10-CM | POA: Diagnosis not present

## 2024-06-27 DIAGNOSIS — R5381 Other malaise: Secondary | ICD-10-CM | POA: Diagnosis present

## 2024-06-27 DIAGNOSIS — G43909 Migraine, unspecified, not intractable, without status migrainosus: Secondary | ICD-10-CM | POA: Diagnosis present

## 2024-06-27 DIAGNOSIS — Z885 Allergy status to narcotic agent status: Secondary | ICD-10-CM

## 2024-06-27 DIAGNOSIS — R531 Weakness: Principal | ICD-10-CM

## 2024-06-27 DIAGNOSIS — E782 Mixed hyperlipidemia: Secondary | ICD-10-CM | POA: Diagnosis not present

## 2024-06-27 DIAGNOSIS — F419 Anxiety disorder, unspecified: Secondary | ICD-10-CM | POA: Diagnosis present

## 2024-06-27 DIAGNOSIS — E039 Hypothyroidism, unspecified: Secondary | ICD-10-CM | POA: Diagnosis present

## 2024-06-27 DIAGNOSIS — Z961 Presence of intraocular lens: Secondary | ICD-10-CM | POA: Diagnosis present

## 2024-06-27 DIAGNOSIS — Z993 Dependence on wheelchair: Secondary | ICD-10-CM

## 2024-06-27 DIAGNOSIS — G35D Multiple sclerosis, unspecified: Principal | ICD-10-CM | POA: Diagnosis present

## 2024-06-27 DIAGNOSIS — K2900 Acute gastritis without bleeding: Secondary | ICD-10-CM

## 2024-06-27 DIAGNOSIS — J069 Acute upper respiratory infection, unspecified: Secondary | ICD-10-CM | POA: Diagnosis present

## 2024-06-27 DIAGNOSIS — M6281 Muscle weakness (generalized): Secondary | ICD-10-CM | POA: Diagnosis present

## 2024-06-27 DIAGNOSIS — F32A Depression, unspecified: Secondary | ICD-10-CM | POA: Diagnosis present

## 2024-06-27 DIAGNOSIS — Z1152 Encounter for screening for COVID-19: Secondary | ICD-10-CM

## 2024-06-27 DIAGNOSIS — Z7983 Long term (current) use of bisphosphonates: Secondary | ICD-10-CM

## 2024-06-27 DIAGNOSIS — Z79890 Hormone replacement therapy (postmenopausal): Secondary | ICD-10-CM

## 2024-06-27 DIAGNOSIS — Z79899 Other long term (current) drug therapy: Secondary | ICD-10-CM

## 2024-06-27 DIAGNOSIS — E559 Vitamin D deficiency, unspecified: Secondary | ICD-10-CM | POA: Diagnosis present

## 2024-06-27 LAB — BASIC METABOLIC PANEL WITH GFR
Anion gap: 14 (ref 5–15)
BUN: 13 mg/dL (ref 8–23)
CO2: 21 mmol/L — ABNORMAL LOW (ref 22–32)
Calcium: 9.2 mg/dL (ref 8.9–10.3)
Chloride: 108 mmol/L (ref 98–111)
Creatinine, Ser: 0.67 mg/dL (ref 0.44–1.00)
GFR, Estimated: >60 mL/min
Glucose, Bld: 97 mg/dL (ref 70–99)
Potassium: 3.8 mmol/L (ref 3.5–5.1)
Sodium: 142 mmol/L (ref 135–145)

## 2024-06-27 LAB — CBC WITH DIFFERENTIAL/PLATELET
Abs Immature Granulocytes: 0.02 K/uL (ref 0.00–0.07)
Basophils Absolute: 0 K/uL (ref 0.0–0.1)
Basophils Relative: 0 %
Eosinophils Absolute: 0 K/uL (ref 0.0–0.5)
Eosinophils Relative: 1 %
HCT: 41.7 % (ref 36.0–46.0)
Hemoglobin: 13.9 g/dL (ref 12.0–15.0)
Immature Granulocytes: 0 %
Lymphocytes Relative: 7 %
Lymphs Abs: 0.4 K/uL — ABNORMAL LOW (ref 0.7–4.0)
MCH: 31.1 pg (ref 26.0–34.0)
MCHC: 33.3 g/dL (ref 30.0–36.0)
MCV: 93.3 fL (ref 80.0–100.0)
Monocytes Absolute: 0.5 K/uL (ref 0.1–1.0)
Monocytes Relative: 8 %
Neutro Abs: 5.3 K/uL (ref 1.7–7.7)
Neutrophils Relative %: 84 %
Platelets: 170 K/uL (ref 150–400)
RBC: 4.47 MIL/uL (ref 3.87–5.11)
RDW: 12.9 % (ref 11.5–15.5)
WBC: 6.3 K/uL (ref 4.0–10.5)
nRBC: 0 % (ref 0.0–0.2)

## 2024-06-27 LAB — RESP PANEL BY RT-PCR (RSV, FLU A&B, COVID)  RVPGX2
Influenza A by PCR: NEGATIVE
Influenza B by PCR: NEGATIVE
Resp Syncytial Virus by PCR: NEGATIVE
SARS Coronavirus 2 by RT PCR: NEGATIVE

## 2024-06-27 MED ORDER — DM-GUAIFENESIN ER 30-600 MG PO TB12
1.0000 | ORAL_TABLET | Freq: Two times a day (BID) | ORAL | Status: DC
  Administered 2024-06-27 – 2024-07-03 (×11): 1 via ORAL
  Filled 2024-06-27 (×11): qty 1

## 2024-06-27 MED ORDER — LEVOTHYROXINE SODIUM 50 MCG PO TABS
50.0000 ug | ORAL_TABLET | Freq: Every day | ORAL | Status: DC
  Administered 2024-06-28 – 2024-07-03 (×6): 50 ug via ORAL
  Filled 2024-06-27 (×5): qty 1

## 2024-06-27 MED ORDER — ORAL CARE MOUTH RINSE: 15.0000 mL | OROMUCOSAL | Status: DC | PRN

## 2024-06-27 MED ORDER — ACETAMINOPHEN 325 MG PO TABS
650.0000 mg | ORAL_TABLET | Freq: Four times a day (QID) | ORAL | Status: DC | PRN
  Filled 2024-06-27: qty 2

## 2024-06-27 MED ORDER — GADOBUTROL 1 MMOL/ML IV SOLN
6.0000 mL | Freq: Once | INTRAVENOUS | Status: AC | PRN
  Administered 2024-06-27: 6 mL via INTRAVENOUS

## 2024-06-27 MED ORDER — ONDANSETRON HCL 4 MG PO TABS: 4.0000 mg | ORAL_TABLET | Freq: Four times a day (QID) | ORAL | Status: DC | PRN

## 2024-06-27 MED ORDER — ACETAMINOPHEN 650 MG RE SUPP: 650.0000 mg | Freq: Four times a day (QID) | RECTAL | Status: DC | PRN

## 2024-06-27 MED ORDER — GUAIFENESIN-DM 100-10 MG/5ML PO SYRP: 5.0000 mL | ORAL_SOLUTION | ORAL | Status: DC | PRN

## 2024-06-27 MED ORDER — SIMVASTATIN 20 MG PO TABS
20.0000 mg | ORAL_TABLET | Freq: Every day | ORAL | Status: DC
  Administered 2024-06-28 – 2024-07-03 (×6): 20 mg via ORAL
  Filled 2024-06-27 (×5): qty 1

## 2024-06-27 MED ORDER — ONDANSETRON HCL 4 MG/2ML IJ SOLN: 4.0000 mg | Freq: Four times a day (QID) | INTRAMUSCULAR | Status: DC | PRN

## 2024-06-27 MED ORDER — PANTOPRAZOLE SODIUM 40 MG PO TBEC
40.0000 mg | DELAYED_RELEASE_TABLET | Freq: Every day | ORAL | Status: DC
  Administered 2024-06-28 – 2024-07-03 (×6): 40 mg via ORAL
  Filled 2024-06-27 (×5): qty 1

## 2024-06-27 MED ORDER — ENOXAPARIN SODIUM 40 MG/0.4ML IJ SOSY
40.0000 mg | PREFILLED_SYRINGE | INTRAMUSCULAR | Status: DC
  Administered 2024-06-27 – 2024-07-02 (×6): 40 mg via SUBCUTANEOUS
  Filled 2024-06-27 (×6): qty 0.4

## 2024-06-27 NOTE — ED Provider Notes (Signed)
 " [place] EMERGENCY DEPARTMENT AT [facility] Provider Note   CSN: [id number] Arrival date & time: 06/27/24  1507     Patient presents with: Weakness and Cough   Pamela Roman is a 68 y.o. female.  With a past medical history of MS who presents emergency department with a chief complaint of generalized weakness.  Patient reports that yesterday she developed a little bit of a sore throat and some nasal congestion.  This morning when she woke up she was able to stand at her baseline but was feeling a little bit weak.  Normally she is predominantly wheelchair-bound but can get up and ambulate for very short distances with her walker.  She has a caretaker who comes in to help her every day.  She reports that her URI symptoms worsened today which included cough, congestion.  She was unable to ambulate once her caretaker arrive to even stand or transition and is afraid she may be having a MS exacerbation.  She has no other new symptoms except for weakness and inability to stand.    Weakness Associated symptoms: cough   Cough      Prior to Admission medications  Medication Sig Start Date End Date Taking? Authorizing Provider  acetaminophen  (TYLENOL ) 325 MG tablet Take 2 tablets (650 mg total) by mouth every 6 (six) hours as needed for mild pain (pain score 1-3), fever or headache. 07/19/23   Pearlean Manus, MD  albuterol  (PROVENTIL ) (2.5 MG/3ML) 0.083% nebulizer solution Take 3 mLs (2.5 mg total) by nebulization every 4 (four) hours as needed for wheezing or shortness of breath. 07/19/23   Pearlean Manus, MD  alendronate  (FOSAMAX ) 70 MG tablet TAKE 1 TABLET ONCE A WEEK WITH A FULL GLASS OF WATER ON AN EMPTY STOMACH 11/30/23   Tobie Suzzane POUR, MD  azelastine  (ASTELIN ) 0.1 % nasal spray Place 1 spray into both nostrils 2 (two) times daily. Use in each nostril as directed 12/22/23   Stuart Vernell Norris, PA-C  BETA CAROTENE PO Take 1 capsule by mouth in the morning. Vitamin A, unknown  strength.    [provider]  Cholecalciferol  (VITAMIN D3) 125 MCG (5000 UT) CAPS Take 1 capsule (5,000 Units total) by mouth daily. 03/10/23   Tobie Suzzane POUR, MD  Cladribine , 6 Tabs, 10 MG TBPK Take 1.5 tablets daily for 4 days, then 23-27 days later, take 1.5 tablets daily for 4 days. 10/04/23   Skeet, Adam R, DO  diazepam  (VALIUM ) 5 MG tablet TAKE 1 TABLET BY MOUTH 30 TO 40 MINUTES BEFORE MRI 10/29/23   Skeet Cornet R, DO  fluocinolone  0.01 % cream Apply topically 2 (two) times daily. 03/15/24   Tobie Suzzane POUR, MD  levothyroxine  (SYNTHROID ) 50 MCG tablet TAKE 1 TABLET DAILY BEFORE BREAKFAST 02/01/24   Tobie Suzzane POUR, MD  Multiple Vitamins-Minerals (ONE-A-DAY WOMENS 50 PLUS) TABS Take 1 tablet by mouth in the morning.    [provider]  pantoprazole  (PROTONIX ) 40 MG tablet Take 1 tablet (40 mg total) by mouth daily. 01/11/24   Tobie Suzzane POUR, MD  pregabalin  (LYRICA ) 25 MG capsule Take 1 capsule (25 mg total) by mouth 2 (two) times daily. 02/16/24   Tobie Suzzane POUR, MD  sertraline  (ZOLOFT ) 25 MG tablet Take 1 tablet (25 mg total) by mouth daily. 02/17/24   Carvin Arvella HERO, MD  simvastatin  (ZOCOR ) 20 MG tablet TAKE 1 TABLET DAILY 02/01/24   Tobie Suzzane POUR, MD  SUMAtriptan  (IMITREX ) 100 MG tablet TAKE 1 TABLET  EVERY 2 HOURS AS NEEDED FOR MIGRAINE. MAY REPEAT IN 2 HOURS IF HEADACHE PERSISTS OR RECURS 05/21/21   Skeet Juliene SAUNDERS, DO  zonisamide  (ZONEGRAN ) 100 MG capsule Take 1 capsule (100 mg total) by mouth daily. 04/06/24   Skeet Juliene SAUNDERS, DO    Allergies: Codeine    Review of Systems  Respiratory:  Positive for cough.   Neurological:  Positive for weakness.    Updated Vital Signs BP (!) 143/77   Pulse 77   Temp 97.7 F (36.5 C) (Oral)   Resp 17   Ht 5' (1.524 m)   Wt 63.5 kg   SpO2 94%   BMI 27.34 kg/m   Physical Exam Vitals and nursing note reviewed.  Constitutional:      General: She is not in acute distress.    Appearance: She is well-developed. She is not diaphoretic.   HENT:     Head: Normocephalic and atraumatic.     Right Ear: External ear normal.     Left Ear: External ear normal.     Nose: Nose normal.     Mouth/Throat:     Mouth: Mucous membranes are moist.  Eyes:     General: No scleral icterus.    Conjunctiva/sclera: Conjunctivae normal.     Comments: Intermittent disconjugate gaze is baseline for the patient  Cardiovascular:     Rate and Rhythm: Normal rate and regular rhythm.     Heart sounds: Normal heart sounds. No murmur heard.    No friction rub. No gallop.  Pulmonary:     Effort: Pulmonary effort is normal. No respiratory distress.     Breath sounds: Normal breath sounds.  Abdominal:     General: Bowel sounds are normal. There is no distension.     Palpations: Abdomen is soft. There is no mass.     Tenderness: There is no abdominal tenderness. There is no guarding.  Musculoskeletal:     Cervical back: Normal range of motion.  Skin:    General: Skin is warm and dry.  Neurological:     Mental Status: She is alert and oriented to person, place, and time.     Motor: Weakness present.     Comments: Weakness predominantly in the lower extremities  Psychiatric:        Behavior: Behavior normal.     (all labs ordered are listed, but only abnormal results are displayed) Labs Reviewed  BASIC METABOLIC PANEL WITH GFR - Abnormal; Notable for the following components:      Result Value   CO2 21 (*)    All other components within normal limits  CBC WITH DIFFERENTIAL/PLATELET - Abnormal; Notable for the following components:   Lymphs Abs 0.4 (*)    All other components within normal limits  RESP PANEL BY RT-PCR (RSV, FLU A&B, COVID)  [id number]  URINALYSIS, ROUTINE W REFLEX MICROSCOPIC    EKG: None  Radiology: No results found.   Procedures   Medications Ordered in the ED - No data to display  Clinical Course as of 06/27/24 1655  Tue Jun 27, 2024  1650 Case discussed with Dr. Germaine recommend Mri brain w/wo contrast.   Suspect that this is likely pseudoflare in the setting of viral infection however if the patient's MRI returns and shows new lesions and she may need to be treated for true MS flare. [AH]    Clinical Course User Index [AH] Mylasia Vorhees, PA-C  Medical Decision Making Amount and/or Complexity of Data Reviewed Labs: ordered. Radiology: ordered.  Risk Prescription drug management. Decision regarding hospitalization.   68 year old female with a history of MS who presents emergency department with acute onset weakness on inability to perform her ADLs.  Concern for potential MS flare versus pseudo flare.  I ordered labs including CBC BMP urine and respiratory panel.  Respiratory panel is negative, labs are otherwise reassuring, urine is still pending.  As per consult with Dr. Germaine MRI brain with and without contrast ordered and results are currently pending. Case discussed with PA [place] and shift handoff. Patient will need admission for her flare and inablility to ambulate     Final diagnoses:  None    ED Discharge Orders     None          Arloa Chroman, PA-C 06/27/24 1930    Elnor Jayson LABOR, DO 07/03/24 1539  "

## 2024-06-27 NOTE — ED Provider Notes (Signed)
 In short patient is a 68 year old female with history of MS who presents to ED with chief complaint of generalized weakness.  Has associated sore throat and some nasal congestion.  Typically able to walk short distances with a walker at baseline but has been unable to ambulate since her symptoms worsening today.  At time of signout her lab work is overall reassuring.  Her MRI is still pending.  Patient had been discussed with Dr. Germaine with neurology suspicious for pseudo flare in the setting of viral infection.   Physical Exam  BP 101/65 (BP Location: Left Arm)   Pulse 76   Temp 97.8 F (36.6 C) (Oral)   Resp 16   Ht 5' 3 (1.6 m)   Wt 60 kg   SpO2 96%   BMI 23.43 kg/m   Physical Exam Vitals and nursing note reviewed.  Constitutional:      General: She is not in acute distress.    Appearance: She is well-developed.  HENT:     Head: Normocephalic and atraumatic.  Eyes:     Conjunctiva/sclera: Conjunctivae normal.  Cardiovascular:     Rate and Rhythm: Normal rate and regular rhythm.     Heart sounds: No murmur heard. Pulmonary:     Effort: Pulmonary effort is normal. No respiratory distress.     Breath sounds: Normal breath sounds.  Abdominal:     Palpations: Abdomen is soft.     Tenderness: There is no abdominal tenderness.  Musculoskeletal:        General: No swelling.     Cervical back: Neck supple.  Skin:    General: Skin is warm and dry.     Capillary Refill: Capillary refill takes less than 2 seconds.  Neurological:     Mental Status: She is alert.     Comments: Bilateral lower extremity weakness, pulses 2+, no point tenderness  Psychiatric:        Mood and Affect: Mood normal.     Procedures  Procedures  ED Course / MDM   Clinical Course as of 06/30/24 2309  Tue Jun 27, 2024  1650 Case discussed with Dr. Germaine recommend Mri brain w/wo contrast.  Suspect that this is likely pseudoflare in the setting of viral infection however if the patient's MRI  returns and shows new lesions and she may need to be treated for true MS flare. [AH]    Clinical Course User Index [AH] Arloa Chroman, PA-C   Medical Decision Making Amount and/or Complexity of Data Reviewed Labs: ordered. Radiology: ordered.  Risk Prescription drug management. Decision regarding hospitalization.   MRI without any new lesions.  Hospitalist consulted for admission for likely pseudo flare Dr. Manfred gave to admission      Donnajean Lynwood DEL, PA-C 06/30/24 2309    Elnor Jayson LABOR, DO 07/03/24 1540

## 2024-06-27 NOTE — Telephone Encounter (Signed)
 Called to speak to patient and she is waiting for EMS to arrive stated she is unable to get up or move

## 2024-06-27 NOTE — H&P (Signed)
 " History and Physical    Patient: Pamela Roman FMW:[id number] DOB: 12/27/1955 DOA: 06/27/2024 DOS: the patient was seen and examined on 06/27/2024 PCP: Tobie Suzzane POUR, MD  Patient coming from: Home  Chief Complaint:  Chief Complaint  Patient presents with   Weakness   Cough   HPI: Pamela Roman is a 68 y.o. female with medical history significant of multiple sclerosis presents to the ED from home via EMS due to generalized weakness which started today.  Patient complained of 1 day onset of upper respiratory tract infection symptoms including nasal congestion and sore throat which worsened today with cough and chest congestion resulting in being unable to ambulate, stand or transition to wheelchair.  Ideally at baseline, though wheelchair-bound, was able to get up and ambulate for short distances with a walker.  She has a caretaker that comes in daily to help.  ED course In the emergency department, she was hemodynamically stable.  Workup in the ED showed normal CBC and BMP except for bicarb of 21.  Influenza A, B, SARS-CoV-2, RSV was negative. MRI of brain with and without contrast showed no new or enhancing lesions. Neurologist (Dr. Germaine) was consulted and considered this to be a multiple sclerosis pseudo flare.  Symptomatic treatment recommended.  [facility] was asked to admit patient.   Review of Systems: As mentioned in the history of present illness. All other systems reviewed and are negative. Past Medical History:  Diagnosis Date   Anxiety    Depression    [employer]reesia 08/06/2020   Hx of migraines    Hyperglycemia    Hypothyroidism    Migraines    Multiple sclerosis    Neuromuscular disorder [facility])    Past Surgical History:  Procedure Laterality Date   CATARACT EXTRACTION W/PHACO Left 01/01/2020   Procedure: CATARACT EXTRACTION PHACO AND INTRAOCULAR LENS PLACEMENT LEFT EYE;  Surgeon: Harrie Agent, MD;  Location: [facility];  Service: Ophthalmology;  Laterality: Left;  CDE: 3.37    CATARACT EXTRACTION W/PHACO Right 01/19/2020   Procedure: CATARACT EXTRACTION PHACO AND INTRAOCULAR LENS PLACEMENT RIGHT EYE;  Surgeon: Harrie Agent, MD;  Location: [facility];  Service: Ophthalmology;  Laterality: Right;  CDE: 3.16   TONSILLECTOMY     Social History:  reports that she has never smoked. She has never been exposed to tobacco smoke. She has never used smokeless tobacco. She reports that she does not currently use alcohol. She reports that she does not use drugs.  Allergies[1]  Family History  Problem Relation Age of Onset   Dementia Mother    Dementia Father     Prior to Admission medications  Medication Sig Start Date End Date Taking? Authorizing Provider  acetaminophen  (TYLENOL ) 325 MG tablet Take 2 tablets (650 mg total) by mouth every 6 (six) hours as needed for mild pain (pain score 1-3), fever or headache. 07/19/23   Pearlean Manus, MD  albuterol  (PROVENTIL ) (2.5 MG/3ML) 0.083% nebulizer solution Take 3 mLs (2.5 mg total) by nebulization every 4 (four) hours as needed for wheezing or shortness of breath. 07/19/23   Pearlean Manus, MD  alendronate  (FOSAMAX ) 70 MG tablet TAKE 1 TABLET ONCE A WEEK WITH A FULL GLASS OF WATER ON AN EMPTY STOMACH 11/30/23   Tobie Suzzane POUR, MD  azelastine  (ASTELIN ) 0.1 % nasal spray Place 1 spray into both nostrils 2 (two) times daily. Use in each nostril as directed 12/22/23   Stuart Vernell Norris, PA-C  BETA CAROTENE PO Take 1 capsule by mouth in the  morning. Vitamin A, unknown strength.    [provider]  Cholecalciferol  (VITAMIN D3) 125 MCG (5000 UT) CAPS Take 1 capsule (5,000 Units total) by mouth daily. 03/10/23   Tobie Suzzane POUR, MD  Cladribine , 6 Tabs, 10 MG TBPK Take 1.5 tablets daily for 4 days, then 23-27 days later, take 1.5 tablets daily for 4 days. 10/04/23   Skeet, Adam R, DO  diazepam  (VALIUM ) 5 MG tablet TAKE 1 TABLET BY MOUTH 30 TO 40 MINUTES BEFORE MRI 10/29/23   Skeet Cornet R, DO  fluocinolone  0.01 % cream Apply  topically 2 (two) times daily. 03/15/24   Tobie Suzzane POUR, MD  levothyroxine  (SYNTHROID ) 50 MCG tablet TAKE 1 TABLET DAILY BEFORE BREAKFAST 02/01/24   Tobie Suzzane POUR, MD  Multiple Vitamins-Minerals (ONE-A-DAY WOMENS 50 PLUS) TABS Take 1 tablet by mouth in the morning.    [provider]  pantoprazole  (PROTONIX ) 40 MG tablet Take 1 tablet (40 mg total) by mouth daily. 01/11/24   Tobie Suzzane POUR, MD  pregabalin  (LYRICA ) 25 MG capsule Take 1 capsule (25 mg total) by mouth 2 (two) times daily. 02/16/24   Tobie Suzzane POUR, MD  sertraline  (ZOLOFT ) 25 MG tablet Take 1 tablet (25 mg total) by mouth daily. 02/17/24   Carvin Arvella HERO, MD  simvastatin  (ZOCOR ) 20 MG tablet TAKE 1 TABLET DAILY 02/01/24   Tobie Suzzane POUR, MD  SUMAtriptan  (IMITREX ) 100 MG tablet TAKE 1 TABLET EVERY 2 HOURS AS NEEDED FOR MIGRAINE. MAY REPEAT IN 2 HOURS IF HEADACHE PERSISTS OR RECURS 05/21/21   Skeet Cornet SAUNDERS, DO  zonisamide  (ZONEGRAN ) 100 MG capsule Take 1 capsule (100 mg total) by mouth daily. 04/06/24   Skeet Cornet SAUNDERS, DO    Physical Exam: Vitals:   06/27/24 1630 06/27/24 1700 06/27/24 1830 06/27/24 1900  BP: (!) 143/77 (!) 151/84 (!) 150/135 (!) 144/83  Pulse: 77 76 84 75  Resp: 17 12 19 19   Temp:      TempSrc:      SpO2: 94% 99% 98% 97%  Weight:      Height:       General: Awake and alert and oriented x3. Not in any acute distress.  HEENT: NCAT.  PERRLA. EOMI. Sclerae anicteric.  Moist mucosal membranes. Neck: Neck supple without lymphadenopathy. No carotid bruits. No masses palpated.  Cardiovascular: Regular rate with normal S1-S2 sounds. No murmurs, rubs or gallops auscultated. No JVD.  Respiratory: Clear breath sounds.  No accessory muscle use. Abdomen: Soft, nontender, nondistended. Active bowel sounds. No masses or hepatosplenomegaly  Skin: No rashes, lesions, or ulcerations.  Dry, warm to touch. Musculoskeletal:  2+ dorsalis pedis and radial pulses. Good ROM.  No contractures  Psychiatric: Intact judgment and  insight.  Mood appropriate to current condition. Neurologic: No focal neurological deficits. CN II - XII grossly intact.  Assessment and Plan: Generalized weakness/ambulatory dysfunction Patient's weakness was possibly due to URI symptoms Continue symptomatic treatment with Tylenol , Mucinex , Robitussin Continue fall precaution Continue PT/OT eval and treat  Multiple sclerosis MRI of brain without any  new or enhancing lesions. Continue PT/OT eval and treat Patient will continue outpatient neurology follow-up  Acquired hypothyroidism Continue Synthroid   GERD Continue Protonix   Mixed hyperlipidemia Continue statin   Advance Care Planning: Full code  Consults: None  Family Communication: None at bedside  Severity of Illness: The appropriate patient status for this patient is OBSERVATION. Observation status is judged to be reasonable and necessary in order to provide the required intensity of service to  ensure the patient's safety. The patient's presenting symptoms, physical exam findings, and initial radiographic and laboratory data in the context of their medical condition is felt to place them at decreased risk for further clinical deterioration. Furthermore, it is anticipated that the patient will be medically stable for discharge from the hospital within 2 midnights of admission.   Author: Jackson Coffield, DO 06/27/2024 8:09 PM  For on call review [email or web address].      [1]  Allergies Allergen Reactions   Codeine Nausea And Vomiting   "

## 2024-06-27 NOTE — ED Notes (Signed)
 Patient transported to MRI

## 2024-06-27 NOTE — Telephone Encounter (Signed)
 Pt called in this afternoon and she stated that she has a bad cold, and now Pt stated that she can not walk at all without assistance.Please call. Thanks

## 2024-06-27 NOTE — ED Triage Notes (Signed)
 Per EMS, Pt, from home, c/o nasal congestion x1 day and weakness and cough starting today.  Denies n/v/d and pain.  Hx of MS.  Pt reports she has been too weak to get out of bed today.

## 2024-06-27 NOTE — Plan of Care (Signed)
" °  Problem: Education: Goal: Knowledge of General Education information will improve Description: Including pain rating scale, medication(s)/side effects and non-pharmacologic comfort measures Outcome: Progressing   Problem: Clinical Measurements: Goal: Respiratory complications will improve Outcome: Progressing Goal: Cardiovascular complication will be avoided Outcome: Progressing   Problem: Nutrition: Goal: Adequate nutrition will be maintained Outcome: Progressing   Problem: Coping: Goal: Level of anxiety will decrease Outcome: Not Applicable   "

## 2024-06-27 NOTE — ED Notes (Signed)
 Patient phone plugged in at nurses station. No other needs at this time.

## 2024-06-27 NOTE — ED Notes (Signed)
 ED Provider at bedside.

## 2024-06-27 NOTE — ED Notes (Signed)
 Pt asked to call her sister to provide and update.

## 2024-06-28 ENCOUNTER — Other Ambulatory Visit: Payer: Self-pay

## 2024-06-28 DIAGNOSIS — G35D Multiple sclerosis, unspecified: Secondary | ICD-10-CM | POA: Diagnosis present

## 2024-06-28 DIAGNOSIS — Z79899 Other long term (current) drug therapy: Secondary | ICD-10-CM | POA: Diagnosis not present

## 2024-06-28 DIAGNOSIS — Z961 Presence of intraocular lens: Secondary | ICD-10-CM | POA: Diagnosis present

## 2024-06-28 DIAGNOSIS — Z885 Allergy status to narcotic agent status: Secondary | ICD-10-CM | POA: Diagnosis not present

## 2024-06-28 DIAGNOSIS — Z7983 Long term (current) use of bisphosphonates: Secondary | ICD-10-CM | POA: Diagnosis not present

## 2024-06-28 DIAGNOSIS — E559 Vitamin D deficiency, unspecified: Secondary | ICD-10-CM | POA: Diagnosis present

## 2024-06-28 DIAGNOSIS — Z993 Dependence on wheelchair: Secondary | ICD-10-CM | POA: Diagnosis not present

## 2024-06-28 DIAGNOSIS — E039 Hypothyroidism, unspecified: Secondary | ICD-10-CM | POA: Diagnosis present

## 2024-06-28 DIAGNOSIS — F32A Depression, unspecified: Secondary | ICD-10-CM | POA: Diagnosis present

## 2024-06-28 DIAGNOSIS — K219 Gastro-esophageal reflux disease without esophagitis: Secondary | ICD-10-CM | POA: Diagnosis present

## 2024-06-28 DIAGNOSIS — R531 Weakness: Secondary | ICD-10-CM | POA: Diagnosis present

## 2024-06-28 DIAGNOSIS — R5381 Other malaise: Secondary | ICD-10-CM | POA: Diagnosis present

## 2024-06-28 DIAGNOSIS — F419 Anxiety disorder, unspecified: Secondary | ICD-10-CM | POA: Diagnosis present

## 2024-06-28 DIAGNOSIS — G43909 Migraine, unspecified, not intractable, without status migrainosus: Secondary | ICD-10-CM | POA: Diagnosis present

## 2024-06-28 DIAGNOSIS — J069 Acute upper respiratory infection, unspecified: Secondary | ICD-10-CM | POA: Diagnosis present

## 2024-06-28 DIAGNOSIS — Z79890 Hormone replacement therapy (postmenopausal): Secondary | ICD-10-CM | POA: Diagnosis not present

## 2024-06-28 DIAGNOSIS — Z1152 Encounter for screening for COVID-19: Secondary | ICD-10-CM | POA: Diagnosis not present

## 2024-06-28 DIAGNOSIS — M6281 Muscle weakness (generalized): Secondary | ICD-10-CM | POA: Diagnosis present

## 2024-06-28 DIAGNOSIS — G43809 Other migraine, not intractable, without status migrainosus: Secondary | ICD-10-CM | POA: Diagnosis not present

## 2024-06-28 DIAGNOSIS — E782 Mixed hyperlipidemia: Secondary | ICD-10-CM | POA: Diagnosis present

## 2024-06-28 LAB — HIV ANTIBODY (ROUTINE TESTING W REFLEX): HIV Screen 4th Generation wRfx: NONREACTIVE

## 2024-06-28 LAB — COMPREHENSIVE METABOLIC PANEL WITH GFR
ALT: 9 U/L (ref 0–44)
AST: 17 U/L (ref 15–41)
Albumin: 4.1 g/dL (ref 3.5–5.0)
Alkaline Phosphatase: 110 U/L (ref 38–126)
Anion gap: 14 (ref 5–15)
BUN: 16 mg/dL (ref 8–23)
CO2: 18 mmol/L — ABNORMAL LOW (ref 22–32)
Calcium: 8.8 mg/dL — ABNORMAL LOW (ref 8.9–10.3)
Chloride: 108 mmol/L (ref 98–111)
Creatinine, Ser: 0.68 mg/dL (ref 0.44–1.00)
GFR, Estimated: >60 mL/min
Glucose, Bld: 93 mg/dL (ref 70–99)
Potassium: 3.7 mmol/L (ref 3.5–5.1)
Sodium: 140 mmol/L (ref 135–145)
Total Bilirubin: 0.7 mg/dL (ref 0.0–1.2)
Total Protein: 6.5 g/dL (ref 6.5–8.1)

## 2024-06-28 LAB — URINALYSIS, ROUTINE W REFLEX MICROSCOPIC
Bilirubin Urine: NEGATIVE
Glucose, UA: NEGATIVE mg/dL
Ketones, ur: 5 mg/dL — AB
Nitrite: NEGATIVE
Protein, ur: NEGATIVE mg/dL
Specific Gravity, Urine: 1.018 (ref 1.005–1.030)
pH: 5 (ref 5.0–8.0)

## 2024-06-28 LAB — CBC
HCT: 38.3 % (ref 36.0–46.0)
Hemoglobin: 12.8 g/dL (ref 12.0–15.0)
MCH: 31.6 pg (ref 26.0–34.0)
MCHC: 33.4 g/dL (ref 30.0–36.0)
MCV: 94.6 fL (ref 80.0–100.0)
Platelets: 158 K/uL (ref 150–400)
RBC: 4.05 MIL/uL (ref 3.87–5.11)
RDW: 13.2 % (ref 11.5–15.5)
WBC: 6 K/uL (ref 4.0–10.5)
nRBC: 0 % (ref 0.0–0.2)

## 2024-06-28 LAB — PHOSPHORUS: Phosphorus: 3.5 mg/dL (ref 2.5–4.6)

## 2024-06-28 LAB — MAGNESIUM: Magnesium: 2.2 mg/dL (ref 1.7–2.4)

## 2024-06-28 MED ORDER — ZONISAMIDE 100 MG PO CAPS
100.0000 mg | ORAL_CAPSULE | Freq: Every day | ORAL | Status: DC
  Administered 2024-06-28 – 2024-07-03 (×6): 100 mg via ORAL
  Filled 2024-06-28 (×6): qty 1

## 2024-06-28 MED ORDER — VITAMIN D 25 MCG (1000 UNIT) PO TABS
5000.0000 [IU] | ORAL_TABLET | Freq: Every day | ORAL | Status: DC
  Administered 2024-06-28 – 2024-07-03 (×6): 5000 [IU] via ORAL
  Filled 2024-06-28 (×5): qty 5

## 2024-06-28 MED ORDER — SERTRALINE HCL 25 MG PO TABS
25.0000 mg | ORAL_TABLET | Freq: Every day | ORAL | Status: DC
  Administered 2024-06-28 – 2024-07-03 (×6): 25 mg via ORAL
  Filled 2024-06-28 (×5): qty 1

## 2024-06-28 NOTE — Plan of Care (Signed)
" °  Problem: Acute Rehab PT Goals(only PT should resolve) Goal: Pt Will Go Supine/Side To Sit Outcome: Progressing Flowsheets (Taken 06/28/2024 1228) Pt will go Supine/Side to Sit:  with modified independence  with supervision Goal: Patient Will Transfer Sit To/From Stand Outcome: Progressing Flowsheets (Taken 06/28/2024 1228) Patient will transfer sit to/from stand: with minimal assist Goal: Pt Will Transfer Bed To Chair/Chair To Bed Outcome: Progressing Flowsheets (Taken 06/28/2024 1228) Pt will Transfer Bed to Chair/Chair to Bed:  with modified independence  with supervision Goal: Pt Will Ambulate Outcome: Progressing Flowsheets (Taken 06/28/2024 1228) Pt will Ambulate:  15 feet  with moderate assist  with rolling walker   12:29 PM, 06/28/2024 Lynwood Music, MPT Physical Therapist with [facility] [phone] office [phone] mobile phone  "

## 2024-06-28 NOTE — Evaluation (Signed)
 Physical Therapy Evaluation Patient Details Name: Pamela Roman MRN: [id number] DOB: Jun 29, 1956 Today's Date: 06/28/2024  History of Present Illness  Pamela Roman is a 68 y.o. female with medical history significant of multiple sclerosis presents to the ED from home via EMS due to generalized weakness which started today.  Patient complained of 1 day onset of upper respiratory tract infection symptoms including nasal congestion and sore throat which worsened today with cough and chest congestion resulting in being unable to ambulate, stand or transition to wheelchair.  Ideally at baseline, though wheelchair-bound, was able to get up and ambulate for short distances with a walker.  She has a caretaker that comes in daily to help.   Clinical Impression  Patient has difficulty moving RLE during bed mobility, has to lift REL over LLE to move to bedside, demonstrates good return for scooting over to chair without AD, very unsteady on feet using RW due weakness and tolerated sitting up in chair after therapy. Patient will benefit from continued skilled physical therapy in hospital and recommended venue below to increase strength, balance, endurance for safe ADLs and gait.         If plan is discharge home, recommend the following: A lot of help with walking and/or transfers;A little help with bathing/dressing/bathroom;Help with stairs or ramp for entrance;Assist for transportation;Assistance with cooking/housework   Can travel by private vehicle        Equipment Recommendations None recommended by PT  Recommendations for Other Services       Functional Status Assessment Patient has had a recent decline in their functional status and demonstrates the ability to make significant improvements in function in a reasonable and predictable amount of time.     Precautions / Restrictions Precautions Precautions: Fall Recall of Precautions/Restrictions: Intact Restrictions Weight Bearing Restrictions  Per Provider Order: No      Mobility  Bed Mobility Overal bed mobility: Needs Assistance Bed Mobility: Supine to Sit     Supine to sit: HOB elevated, Min assist     General bed mobility comments: limited movement of RLE due to weakness, had to use hands and LLE to assist moving due to weakness    Transfers Overall transfer level: Needs assistance Equipment used: Rolling walker (2 wheels) Transfers: Sit to/from Stand, Bed to chair/wheelchair/BSC Sit to Stand: Min assist, Contact guard assist   Step pivot transfers: Min assist, Mod assist       General transfer comment: unsteady on feet with buckling of knees due to weakness    Ambulation/Gait Ambulation/Gait assistance: Mod assist, Max assist Gait Distance (Feet): 5 Feet Assistive device: Rolling walker (2 wheels) Gait Pattern/deviations: Decreased step length - right, Decreased step length - left, Decreased stride length Gait velocity: slow     General Gait Details: limited to a few side steps at beside and loss balance due to buckling of knees, BLE weakness  Stairs            Wheelchair Mobility     Tilt Bed    Modified Rankin (Stroke Patients Only)       Balance Overall balance assessment: Needs assistance Sitting-balance support: Feet supported, No upper extremity supported Sitting balance-Leahy Scale: Good Sitting balance - Comments: seated at EOB   Standing balance support: Bilateral upper extremity supported, During functional activity, Reliant on assistive device for balance Standing balance-Leahy Scale: Poor Standing balance comment: with RW  Pertinent Vitals/Pain Pain Assessment Pain Assessment: Faces Faces Pain Scale: Hurts a little bit Pain Location: R upper thigh Pain Descriptors / Indicators: Discomfort Pain Intervention(s): Limited activity within patient's tolerance, Monitored during session, Repositioned    Home Living Family/patient  expects to be discharged to:: Private residence Living Arrangements: Other relatives Available Help at Discharge: Available PRN/intermittently;Personal care attendant Type of Home: House Home Access: Ramped entrance       Home Layout: Two level;Able to live on main level with bedroom/bathroom Home Equipment: Rolling Walker (2 wheels);Shower seat - built in;Grab bars - toilet;Grab bars - tub/shower;Hand held shower head;Wheelchair - power;Adaptive equipment Additional Comments: leg lifter    Prior Function Prior Level of Function : Needs assist       Physical Assist : Mobility (physical);ADLs (physical) Mobility (physical): Bed mobility;Gait;Transfers;Stairs ADLs (physical): IADLs Mobility Comments: Power w/c used primarily for mobility. Pt transfers to w/c via lateral transfer. Pt is able to ambulate using RW with physical therapy only. ADLs Comments: Independent ADL's, but is assisted for shower transfer. Pt has PCA that comes 2x a week for 3 hours. PCA usually takes the pt to therapy.     Extremity/Trunk Assessment   Upper Extremity Assessment Upper Extremity Assessment: Defer to OT evaluation    Lower Extremity Assessment Lower Extremity Assessment: Generalized weakness;RLE deficits/detail;LLE deficits/detail RLE Deficits / Details: grossly -3/5 RLE Sensation: WNL RLE Coordination: decreased gross motor LLE Deficits / Details: grossly 3+/5 LLE Sensation: WNL LLE Coordination: decreased gross motor    Cervical / Trunk Assessment Cervical / Trunk Assessment: Normal  Communication   Communication Communication: No apparent difficulties    Cognition Arousal: Alert Behavior During Therapy: WFL for tasks assessed/performed                             Following commands: Intact       Cueing Cueing Techniques: Verbal cues, Tactile cues     General Comments      Exercises     Assessment/Plan    PT Assessment Patient needs continued PT services   PT Problem List Decreased strength;Decreased activity tolerance;Decreased balance;Decreased mobility       PT Treatment Interventions DME instruction;Gait training;Functional mobility training;Therapeutic activities;Therapeutic exercise;Balance training;Patient/family education    PT Goals (Current goals can be found in the Care Plan section)  Acute Rehab PT Goals Patient Stated Goal: return home with family, home aides to assist PT Goal Formulation: With patient Time For Goal Achievement: 07/01/24 Potential to Achieve Goals: Good    Frequency Min 3X/week     Co-evaluation PT/OT/SLP Co-Evaluation/Treatment: Yes Reason for Co-Treatment: To address functional/ADL transfers PT goals addressed during session: Mobility/safety with mobility;Balance;Proper use of DME OT goals addressed during session: ADL's and self-care       AM-PAC PT 6 Clicks Mobility  Outcome Measure Help needed turning from your back to your side while in a flat bed without using bedrails?: None Help needed moving from lying on your back to sitting on the side of a flat bed without using bedrails?: A Little Help needed moving to and from a bed to a chair (including a wheelchair)?: A Little Help needed standing up from a chair using your arms (e.g., wheelchair or bedside chair)?: A Lot Help needed to walk in hospital room?: A Lot Help needed climbing 3-5 steps with a railing? : Total 6 Click Score: 15    End of Session   Activity Tolerance: Patient tolerated treatment well;Patient  limited by fatigue Patient left: in chair;with call bell/phone within reach Nurse Communication: Mobility status PT Visit Diagnosis: Unsteadiness on feet (R26.81);Other abnormalities of gait and mobility (R26.89);Muscle weakness (generalized) (M62.81)    Time: 9170-9149 PT Time Calculation (min) (ACUTE ONLY): 21 min   Charges:   PT Evaluation $PT Eval Moderate Complexity: 1 Mod PT Treatments $Therapeutic Activity: 8-22  mins PT General Charges $$ ACUTE PT VISIT: 1 Visit         12:27 PM, 06/28/2024 Lynwood Music, MPT Physical Therapist with [facility] [phone] office [phone] mobile phone

## 2024-06-28 NOTE — Care Management Obs Status (Signed)
 MEDICARE OBSERVATION STATUS NOTIFICATION   Patient Details  Name: Pamela Roman MRN: [id number] Date of Birth: 14-Nov-1955   Medicare Observation Status Notification Given:  Yes    Hoy DELENA Bigness, LCSW 06/28/2024, 10:12 AM

## 2024-06-28 NOTE — Evaluation (Signed)
 Occupational Therapy Evaluation Patient Details Name: Pamela Roman MRN: [id number] DOB: [birth year] Today's Date: 06/28/2024   History of Present Illness   Pamela Roman is a 68 y.o. female with medical history significant of multiple sclerosis presents to the ED from home via EMS due to generalized weakness which started today.  Patient complained of 1 day onset of upper respiratory tract infection symptoms including nasal congestion and sore throat which worsened today with cough and chest congestion resulting in being unable to ambulate, stand or transition to wheelchair.  Ideally at baseline, though wheelchair-bound, was able to get up and ambulate for short distances with a walker.  She has a caretaker that comes in daily to help. (per MD)     Clinical Impressions Pt agreeable to OT and PT co-evaluation. Pt near baseline function for seated ADL's. Min A for bed mobility due to R LE weakness. Pt able to complete lateral scooting transfer to recliner without physical assist. More difficulty with step pivot transfer using RW with pt noted to fatigue and require more assist during the transfer. Pt at level of mod I for seated ADL's. Pt left in the chair with call bell within reach. Pt will benefit from continued OT in the hospital to increase strength, balance, and endurance for safe ADL's.        If plan is discharge home, recommend the following:   A little help with walking and/or transfers;Assist for transportation     Functional Status Assessment   Patient has had a recent decline in their functional status and demonstrates the ability to make significant improvements in function in a reasonable and predictable amount of time.     Equipment Recommendations   None recommended by OT             Precautions/Restrictions   Precautions Precautions: Fall Recall of Precautions/Restrictions: Intact Restrictions Weight Bearing Restrictions Per Provider Order: No      Mobility Bed Mobility Overal bed mobility: Needs Assistance Bed Mobility: Supine to Sit     Supine to sit: HOB elevated, Min assist     General bed mobility comments: Labored effort due to R LE weakness. HOB elevated.    Transfers Overall transfer level: Needs assistance Equipment used: Rolling walker (2 wheels) Transfers: Sit to/from Stand, Bed to chair/wheelchair/BSC Sit to Stand: Min assist, Contact guard assist     Step pivot transfers: Min assist, Mod assist     General transfer comment: Pt completed bed to chair lateral scooting trasnfer without assist. Pt then attempted step pivot transfer from chair to bed and back. Pt needing min to mod A for step pivot with RW. Pt noted to fatigue near the end of the transfer back to the chair.      Balance Overall balance assessment: Needs assistance Sitting-balance support: No upper extremity supported, Feet supported Sitting balance-Leahy Scale: Good Sitting balance - Comments: seated at EOB   Standing balance support: Bilateral upper extremity supported, During functional activity, Reliant on assistive device for balance Standing balance-Leahy Scale: Poor Standing balance comment: with RW                           ADL either performed or assessed with clinical judgement   ADL Overall ADL's : Modified independent  General ADL Comments: Pt able to completed seated ADL's well; lateral transfer with mod I to supervision today. Supervision needed at baseline for shower transfer.     Vision Baseline Vision/History: 1 Wears glasses Ability to See in Adequate Light: 0 Adequate Patient Visual Report: No change from baseline Vision Assessment?: Wears glasses for reading     Perception Perception: Not tested       Praxis Praxis: Not tested       Pertinent Vitals/Pain Pain Assessment Pain Assessment: Faces Faces Pain Scale: Hurts a little bit Pain  Location: R upper thigh Pain Descriptors / Indicators: Discomfort Pain Intervention(s): Limited activity within patient's tolerance, Monitored during session, Repositioned     Extremity/Trunk Assessment Upper Extremity Assessment Upper Extremity Assessment: Generalized weakness;Overall [facility] for tasks assessed   Lower Extremity Assessment Lower Extremity Assessment: Defer to PT evaluation   Cervical / Trunk Assessment Cervical / Trunk Assessment: Normal   Communication Communication Communication: No apparent difficulties   Cognition Arousal: Alert Behavior During Therapy: WFL for tasks assessed/performed Cognition: No apparent impairments                               Following commands: Intact       Cueing  General Comments   Cueing Techniques: Verbal cues;Tactile cues                 Home Living Family/patient expects to be discharged to:: Private residence Living Arrangements: Other relatives (sister) Available Help at Discharge: Available PRN/intermittently;Personal care attendant Type of Home: House Home Access: Ramped entrance     Home Layout: Two level;Able to live on main level with bedroom/bathroom     Bathroom Shower/Tub: Chief Strategy Officer: Standard Bathroom Accessibility: Yes How Accessible: Accessible via wheelchair Home Equipment: Rolling Walker (2 wheels);Shower seat - built in;Grab bars - toilet;Grab bars - tub/shower;Hand held shower head;Wheelchair - power;Adaptive equipment Adaptive Equipment: Other (Comment) (leg lifter)        Prior Functioning/Environment Prior Level of Function : Needs assist       Physical Assist : Mobility (physical);ADLs (physical) Mobility (physical): Bed mobility;Gait;Transfers;Stairs ADLs (physical): IADLs Mobility Comments: Power w/c used primarily for mobility. Pt transfers to w/c via lateral transfer. Pt is able to ambulate using RW with physical therapy only. ADLs Comments:  Independent ADL's, but is assisted for shower transfer. Pt has PCA that comes 2x a week for 3 hours. PCA usually takes the pt to therapy.    OT Problem List: Decreased strength;Decreased activity tolerance;Impaired balance (sitting and/or standing)   OT Treatment/Interventions: Self-care/ADL training;Therapeutic exercise;DME and/or AE instruction;Therapeutic activities;Patient/family education;Balance training      OT Goals(Current goals can be found in the care plan section)   Acute Rehab OT Goals Patient Stated Goal: Return home OT Goal Formulation: With patient Time For Goal Achievement: 07/12/24 Potential to Achieve Goals: Good   OT Frequency:  Min 1X/week    Co-evaluation PT/OT/SLP Co-Evaluation/Treatment: Yes Reason for Co-Treatment: To address functional/ADL transfers   OT goals addressed during session: ADL's and self-care      AM-PAC OT 6 Clicks Daily Activity     Outcome Measure Help from another Roman eating meals?: None Help from another Roman taking care of personal grooming?: None Help from another Roman toileting, which includes using toliet, bedpan, or urinal?: None Help from another Roman bathing (including washing, rinsing, drying)?: None Help from another Roman to put on and taking off regular  upper body clothing?: None Help from another Roman to put on and taking off regular lower body clothing?: None 6 Click Score: 24   End of Session Equipment Utilized During Treatment: Rolling walker (2 wheels)  Activity Tolerance: Patient tolerated treatment well Patient left: in chair;with call bell/phone within reach  OT Visit Diagnosis: Muscle weakness (generalized) (M62.81)                Time: 9169-9148 OT Time Calculation (min): 21 min Charges:  OT General Charges $OT Visit: 1 Visit OT Evaluation $OT Eval Low Complexity: 1 Low  Pamela Roman OT, MOT  Pamela Roman 06/28/2024, 10:21 AM

## 2024-06-28 NOTE — Progress Notes (Signed)
 Pt lives on lower level of sisters home and has her own bathroom, kitchen, and laundry room. Pt has a wheelchair she received 6 months ago and a rollator at home. Pt has a lift for getting out of bed. Pt attends OPPT at [facility] and would like to continue these services as long as insurance will cover them. Pt is not interested in [facility] services as they have not been helpful in the past. Pt has a caregiver that comes 2 days a week and will take her to appointments, help her with showering and laundry.    06/28/24 1022  TOC Brief Assessment  Insurance and Status Reviewed  Patient has primary care physician Yes  Home environment has been reviewed Lives w/ sister. Has own apartment set up within sisters home  Prior level of function: Modified independent. Wheelchair bound at baseline however, able to take small steps and transfer.  Prior/Current Home Services Current home services (Has caregiver for 3hrs/day 2days/wk. Attends OPPT at [facility])  Social Drivers of Health Review SDOH reviewed no interventions necessary  Readmission risk has been reviewed Yes  Transition of care needs no transition of care needs at this time

## 2024-06-28 NOTE — Progress Notes (Addendum)
 " PROGRESS NOTE    Pamela Roman  FMW:[id number] DOB: 1956-03-27 DOA: 06/27/2024 PCP: Tobie Suzzane POUR, MD   Brief Narrative:    68 y.o. female with medical history significant of multiple sclerosis presents to the ED from home via EMS due to generalized weakness which started today.  Patient complained of 1 day onset of upper respiratory tract infection symptoms including nasal congestion and sore throat which worsened today with cough and chest congestion resulting in being unable to ambulate, stand or transition to wheelchair.  MRI of brain with and without contrast showed no new or enhancing lesions. Neurologist (Dr. Germaine) was consulted and considered this to be a multiple sclerosis pseudo flare.  Symptomatic treatment recommended.  Assessment & Plan:  Principal Problem:   Generalized weakness   Generalized weakness/ambulatory dysfunction/physical deconditioning Patient's weakness is likely due to viral upper respiratory tract infection Continue symptomatic treatment with Tylenol , Mucinex , Robitussin Continue fall precaution Continue PT/OT eval and treat. She goes to outpatient physical therapy at [facility] twice a week.   Multiple sclerosis pseudo flare in the setting of viral URI,POA: MRI of brain without any new or enhancing lesions. Continue PT/OT eval and treat Patient will continue outpatient neurology follow-up   Acquired hypothyroidism Continue Synthroid    GERD Continue Protonix    Mixed hyperlipidemia Continue statin  Migraines, POA: She take zonisamide  100 mg every day for migraine prevention and sumatriptan  100 mg for migraine rescue.  Vitamin D  deficiency, POA: Continue with vitamin D3 5000 international units daily.   Disposition: Home.  She lives at home with her sister.  She does have a wheelchair and rollator.  She has a caregiver that comes twice a week.  She goes to outpatient physical therapy at [facility] on Tuesdays and Fridays.   DVT prophylaxis:  enoxaparin  (LOVENOX ) injection 40 mg Start: 06/27/24 2215 SCDs Start: 06/27/24 2127     Code Status: Full Code Family Communication: None at the bedside Status is: Observation The patient remains OBS appropriate and will d/c before 2 midnights.    Subjective:  She feels better in terms of her strength but not at baseline yet.  She said that she had upper respiratory infection and symptoms included runny nose and sneezing that started day before yesterday and then her legs gave out.  She goes to outpatient physical therapy at [facility] twice a week.  She has a caregiver that comes on Tuesday and Fridays and takes her to her outpatient physical therapy appointments.  He does have a wheelchair as well as a rollator at home.  She told me that she got a walker online recently.  Lives at home with her sister  Examination:  General exam: Appears calm and comfortable  Respiratory system: Clear to auscultation. Respiratory effort normal. Cardiovascular system: S1 & S2 heard, RRR. No JVD, murmurs, rubs, gallops or clicks. No pedal edema. Gastrointestinal system: Abdomen is nondistended, soft and nontender. No organomegaly or masses felt. Normal bowel sounds heard. Central nervous system: Alert and oriented. No focal neurological deficits. Extremities: Symmetric 5 x 5 power. Skin: No rashes, lesions or ulcers Psychiatry: Judgement and insight appear normal. Mood & affect appropriate.     Diet Orders (From admission, onward)     Start     Ordered   06/27/24 2128  Diet Heart Room service appropriate? Yes; Fluid consistency: Thin  Diet effective now       Question Answer Comment  Room service appropriate? Yes   Fluid consistency: Thin  06/27/24 2127            Objective: Vitals:   06/27/24 2043 06/27/24 2354 06/28/24 0433 06/28/24 0713  BP: (!) 145/76 114/75 114/69 98/76  Pulse: 71 83 71 84  Resp: 19 18 18 15   Temp: 97.9 F (36.6 C) 98.4 F (36.9 C) 98.2 F (36.8 C) 98.1  F (36.7 C)  TempSrc: Oral Oral  Oral  SpO2: 95% 96% 95% 97%  Weight: 60 kg     Height: 5' 3 (1.6 m)       Intake/Output Summary (Last 24 hours) at 06/28/2024 1113 Last data filed at 06/28/2024 0853 Gross per 24 hour  Intake 600 ml  Output --  Net 600 ml   Filed Weights   06/27/24 1517 06/27/24 2043  Weight: 63.5 kg 60 kg    Scheduled Meds:  dextromethorphan -guaiFENesin   1 tablet Oral BID   enoxaparin  (LOVENOX ) injection  40 mg Subcutaneous Q24H   levothyroxine   50 mcg Oral Q0600   pantoprazole   40 mg Oral Daily   simvastatin   20 mg Oral Daily   Continuous Infusions:  Nutritional status     Body mass index is 23.43 kg/m.  Data Reviewed:   CBC: Recent Labs  Lab 06/27/24 1604 06/28/24 0431  WBC 6.3 6.0  NEUTROABS 5.3  --   HGB 13.9 12.8  HCT 41.7 38.3  MCV 93.3 94.6  PLT 170 158   Basic Metabolic Panel: Recent Labs  Lab 06/27/24 1604 06/28/24 0431  NA 142 140  K 3.8 3.7  CL 108 108  CO2 21* 18*  GLUCOSE 97 93  BUN 13 16  CREATININE 0.67 0.68  CALCIUM 9.2 8.8*  MG  --  2.2  PHOS  --  3.5   GFR: Estimated Creatinine Clearance: 55.7 mL/min (by C-G formula based on SCr of 0.68 mg/dL). Liver Function Tests: Recent Labs  Lab 06/28/24 0431  AST 17  ALT 9  ALKPHOS 110  BILITOT 0.7  PROT 6.5  ALBUMIN 4.1   No results for input(s): LIPASE, AMYLASE in the last 168 hours. No results for input(s): AMMONIA in the last 168 hours. Coagulation Profile: No results for input(s): INR, PROTIME in the last 168 hours. Cardiac Enzymes: No results for input(s): CKTOTAL, CKMB, CKMBINDEX, TROPONINI in the last 168 hours. BNP (last 3 results) No results for input(s): PROBNP in the last 8760 hours. HbA1C: No results for input(s): HGBA1C in the last 72 hours. CBG: No results for input(s): GLUCAP in the last 168 hours. Lipid Profile: No results for input(s): CHOL, HDL, LDLCALC, TRIG, CHOLHDL, LDLDIRECT in the last 72  hours. Thyroid Function Tests: No results for input(s): TSH, T4TOTAL, FREET4, T3FREE, THYROIDAB in the last 72 hours. Anemia Panel: No results for input(s): VITAMINB12, FOLATE, FERRITIN, TIBC, IRON, RETICCTPCT in the last 72 hours. Sepsis Labs: No results for input(s): PROCALCITON, LATICACIDVEN in the last 168 hours.  Recent Results (from the past 240 hours)  Resp panel by RT-PCR (RSV, Flu A&B, Covid) Anterior Nasal Swab     Status: None   Collection Time: 06/27/24  4:04 PM   Specimen: Anterior Nasal Swab  Result Value Ref Range Status   SARS Coronavirus 2 by RT PCR NEGATIVE NEGATIVE Final    Comment: (NOTE) SARS-CoV-2 target nucleic acids are NOT DETECTED.  The SARS-CoV-2 RNA is generally detectable in upper respiratory specimens during the acute phase of infection. The lowest concentration of SARS-CoV-2 viral copies this assay can detect is 138 copies/mL. A negative result does not  preclude SARS-Cov-2 infection and should not be used as the sole basis for treatment or other patient management decisions. A negative result may occur with  improper specimen collection/handling, submission of specimen other than nasopharyngeal swab, presence of viral mutation(s) within the areas targeted by this assay, and inadequate number of viral copies(<138 copies/mL). A negative result must be combined with clinical observations, patient history, and epidemiological information. The expected result is Negative.  Fact Sheet for Patients:  bloggercourse.com  Fact Sheet for Healthcare Providers:  seriousbroker.it  This test is no t yet approved or cleared by the [place]  FDA and  has been authorized for detection and/or diagnosis of SARS-CoV-2 by FDA under an Emergency Use Authorization (EUA). This EUA will remain  in effect (meaning this test can be used) for the duration of the COVID-19 declaration under Section  564(b)(1) of the Act, 21 U.S.C.section 360bbb-3(b)(1), unless the authorization is terminated  or revoked sooner.       Influenza A by PCR NEGATIVE NEGATIVE Final   Influenza B by PCR NEGATIVE NEGATIVE Final    Comment: (NOTE) The Xpert Xpress SARS-CoV-2/FLU/RSV plus assay is intended as an aid in the diagnosis of influenza from Nasopharyngeal swab specimens and should not be used as a sole basis for treatment. Nasal washings and aspirates are unacceptable for Xpert Xpress SARS-CoV-2/FLU/RSV testing.  Fact Sheet for Patients: bloggercourse.com  Fact Sheet for Healthcare Providers: seriousbroker.it  This test is not yet approved or cleared by the [place]  FDA and has been authorized for detection and/or diagnosis of SARS-CoV-2 by FDA under an Emergency Use Authorization (EUA). This EUA will remain in effect (meaning this test can be used) for the duration of the COVID-19 declaration under Section 564(b)(1) of the Act, 21 U.S.C. section 360bbb-3(b)(1), unless the authorization is terminated or revoked.     Resp Syncytial Virus by PCR NEGATIVE NEGATIVE Final    Comment: (NOTE) Fact Sheet for Patients: bloggercourse.com  Fact Sheet for Healthcare Providers: seriousbroker.it  This test is not yet approved or cleared by the [place]  FDA and has been authorized for detection and/or diagnosis of SARS-CoV-2 by FDA under an Emergency Use Authorization (EUA). This EUA will remain in effect (meaning this test can be used) for the duration of the COVID-19 declaration under Section 564(b)(1) of the Act, 21 U.S.C. section 360bbb-3(b)(1), unless the authorization is terminated or revoked.  Performed at [facility], [address] [place], [place] [address]          Radiology Studies: MR Brain W and Wo Contrast Result Date: 06/27/2024 EXAM: MRI BRAIN WITH AND WITHOUT  CONTRAST 06/27/2024 05:49:58 PM TECHNIQUE: Multiplanar multisequence MRI of the head/brain was performed with and without the administration of intravenous contrast. COMPARISON: MRI head 10/31/2023 CLINICAL HISTORY: Neuro deficit, acute, stroke suspected; HX MS, uri, new weakness- r/o MS flare FINDINGS: BRAIN AND VENTRICLES: No acute infarct. Unchanged advanced T2 hyperintensities in the white matter, compatible with multiple sclerosis. No new or enhancing lesions. No acute intracranial hemorrhage. No mass effect or midline shift. No hydrocephalus. The sella is unremarkable. Normal flow voids. No mass or abnormal enhancement. ORBITS: No acute abnormality. SINUSES: No acute abnormality. BONES AND SOFT TISSUES: Normal bone marrow signal and enhancement. No acute soft tissue abnormality. IMPRESSION: 1. Unchanged advanced T2 hyperintensities in the white matter, compatible with multiple sclerosis. No new or enhancing lesions. Electronically signed by: Gilmore Molt 06/27/2024 07:15 PM EST RP Workstation: [id number]           LOS:  0 days   Time spent= 35 mins    Deliliah Room, MD [facility]  If 7PM-7AM, please contact night-coverage  06/28/2024, 11:13 AM  "

## 2024-06-28 NOTE — Plan of Care (Signed)
" °  Problem: Acute Rehab OT Goals (only OT should resolve) Goal: Pt/Caregiver Will Perform Home Exercise Program Flowsheets (Taken 06/28/2024 1023) Pt/caregiver will Perform Home Exercise Program:  Increased strength  Independently  Both right and left upper extremity  Johnna Bollier OT, MOT  "

## 2024-06-29 DIAGNOSIS — R531 Weakness: Secondary | ICD-10-CM | POA: Diagnosis not present

## 2024-06-29 NOTE — Plan of Care (Signed)
" °  Problem: Health Behavior/Discharge Planning: Goal: Ability to manage health-related needs will improve Outcome: Progressing   Problem: Clinical Measurements: Goal: Will remain free from infection Outcome: Progressing   Problem: Activity: Goal: Risk for activity intolerance will decrease Outcome: Progressing   Problem: Elimination: Goal: Will not experience complications related to bowel motility Outcome: Progressing   Problem: Pain Managment: Goal: General experience of comfort will improve and/or be controlled Outcome: Progressing   Problem: Safety: Goal: Ability to remain free from injury will improve Outcome: Progressing   Problem: Skin Integrity: Goal: Risk for impaired skin integrity will decrease Outcome: Progressing   "

## 2024-06-29 NOTE — Progress Notes (Signed)
 " PROGRESS NOTE    Pamela Roman  FMW:[id number] DOB: 02-12-1956 DOA: 06/27/2024 PCP: Tobie Suzzane POUR, MD   Brief Narrative:    68 y.o. female with medical history significant of multiple sclerosis presents to the ED from home via EMS due to generalized weakness which started today.  Patient complained of 1 day onset of upper respiratory tract infection symptoms including nasal congestion and sore throat which worsened today with cough and chest congestion resulting in being unable to ambulate, stand or transition to wheelchair.  MRI of brain with and without contrast showed no new or enhancing lesions. Neurologist (Dr. Germaine) was consulted and considered this to be a multiple sclerosis pseudo flare.  Symptomatic treatment recommended.  Assessment & Plan:  Principal Problem:   Generalized weakness   Generalized weakness/ambulatory dysfunction/physical deconditioning Patient's weakness is likely due to viral upper respiratory tract infection Continue symptomatic treatment with Tylenol , Mucinex , Robitussin Continue fall precaution Continue PT/OT eval and treat.  She may need short-term rehab upon discharge. She goes to outpatient physical therapy at [facility] twice a week.   Multiple sclerosis pseudo flare in the setting of viral URI,POA: MRI of brain without any new or enhancing lesions. Continue PT/OT eval and treat Patient will continue outpatient neurology follow-up   Acquired hypothyroidism Continue Synthroid    GERD Continue Protonix    Mixed hyperlipidemia Continue statin  Migraines, POA: She take zonisamide  100 mg every day for migraine prevention and sumatriptan  100 mg for migraine rescue.  Vitamin D  deficiency, POA: Continue with vitamin D3 5000 international units daily.   Disposition: Home.  She lives at home with her sister.  She does have a wheelchair and rollator.  She has a caregiver that comes twice a week.  She goes to outpatient physical therapy at [facility]  on Tuesdays and Fridays.  Because of her decreased strength and inability to stand up by herself, she may need short-term rehab.   DVT prophylaxis: enoxaparin  (LOVENOX ) injection 40 mg Start: 06/27/24 2215 SCDs Start: 06/27/24 2127     Code Status: Full Code Family Communication: None at the bedside Status is: inpt    Subjective:  She said that she is not able to get up by herself.  Normally at home, she is able to do that.  She said physical therapy worked with her yesterday for about 5 minutes.  She feels weaker than her usual baseline and does not feel like she is ready to go home.  We did talk about short-term rehab and she is agreeable to that.  This was discussed with the case manager as well.  Examination:  General exam: Appears calm and comfortable  Respiratory system: Clear to auscultation. Respiratory effort normal. Cardiovascular system: S1 & S2 heard, RRR. No JVD, murmurs, rubs, gallops or clicks. No pedal edema. Gastrointestinal system: Abdomen is nondistended, soft and nontender. No organomegaly or masses felt. Normal bowel sounds heard. Central nervous system: Alert and oriented. No focal neurological deficits. Extremities: Slightly reduced strength in both legs Skin: No rashes, lesions or ulcers Psychiatry: Judgement and insight appear normal. Mood & affect appropriate.     Diet Orders (From admission, onward)     Start     Ordered   06/27/24 2128  Diet Heart Room service appropriate? Yes; Fluid consistency: Thin  Diet effective now       Question Answer Comment  Room service appropriate? Yes   Fluid consistency: Thin      06/27/24 2127  Objective: Vitals:   06/28/24 1255 06/28/24 1957 06/29/24 0358 06/29/24 0832  BP: 104/65 114/68 (!) 92/56 119/80  Pulse: 89 74 70 75  Resp: 19 17 18 18   Temp: 97.8 F (36.6 C) 98.1 F (36.7 C) 97.7 F (36.5 C) 97.6 F (36.4 C)  TempSrc: Oral Oral Oral Oral  SpO2: 98% 94% 99% 96%  Weight:       Height:        Intake/Output Summary (Last 24 hours) at 06/29/2024 1010 Last data filed at 06/28/2024 1256 Gross per 24 hour  Intake 120 ml  Output --  Net 120 ml   Filed Weights   06/27/24 1517 06/27/24 2043  Weight: 63.5 kg 60 kg    Scheduled Meds:  cholecalciferol   5,000 Units Oral Daily   dextromethorphan -guaiFENesin   1 tablet Oral BID   enoxaparin  (LOVENOX ) injection  40 mg Subcutaneous Q24H   levothyroxine   50 mcg Oral Q0600   pantoprazole   40 mg Oral Daily   sertraline   25 mg Oral Daily   simvastatin   20 mg Oral Daily   zonisamide   100 mg Oral Daily   Continuous Infusions:  Nutritional status     Body mass index is 23.43 kg/m.  Data Reviewed:   CBC: Recent Labs  Lab 06/27/24 1604 06/28/24 0431  WBC 6.3 6.0  NEUTROABS 5.3  --   HGB 13.9 12.8  HCT 41.7 38.3  MCV 93.3 94.6  PLT 170 158   Basic Metabolic Panel: Recent Labs  Lab 06/27/24 1604 06/28/24 0431  NA 142 140  K 3.8 3.7  CL 108 108  CO2 21* 18*  GLUCOSE 97 93  BUN 13 16  CREATININE 0.67 0.68  CALCIUM 9.2 8.8*  MG  --  2.2  PHOS  --  3.5   GFR: Estimated Creatinine Clearance: 55.7 mL/min (by C-G formula based on SCr of 0.68 mg/dL). Liver Function Tests: Recent Labs  Lab 06/28/24 0431  AST 17  ALT 9  ALKPHOS 110  BILITOT 0.7  PROT 6.5  ALBUMIN 4.1   No results for input(s): LIPASE, AMYLASE in the last 168 hours. No results for input(s): AMMONIA in the last 168 hours. Coagulation Profile: No results for input(s): INR, PROTIME in the last 168 hours. Cardiac Enzymes: No results for input(s): CKTOTAL, CKMB, CKMBINDEX, TROPONINI in the last 168 hours. BNP (last 3 results) No results for input(s): PROBNP in the last 8760 hours. HbA1C: No results for input(s): HGBA1C in the last 72 hours. CBG: No results for input(s): GLUCAP in the last 168 hours. Lipid Profile: No results for input(s): CHOL, HDL, LDLCALC, TRIG, CHOLHDL, LDLDIRECT in the  last 72 hours. Thyroid Function Tests: No results for input(s): TSH, T4TOTAL, FREET4, T3FREE, THYROIDAB in the last 72 hours. Anemia Panel: No results for input(s): VITAMINB12, FOLATE, FERRITIN, TIBC, IRON, RETICCTPCT in the last 72 hours. Sepsis Labs: No results for input(s): PROCALCITON, LATICACIDVEN in the last 168 hours.  Recent Results (from the past 240 hours)  Resp panel by RT-PCR (RSV, Flu A&B, Covid) Anterior Nasal Swab     Status: None   Collection Time: 06/27/24  4:04 PM   Specimen: Anterior Nasal Swab  Result Value Ref Range Status   SARS Coronavirus 2 by RT PCR NEGATIVE NEGATIVE Final    Comment: (NOTE) SARS-CoV-2 target nucleic acids are NOT DETECTED.  The SARS-CoV-2 RNA is generally detectable in upper respiratory specimens during the acute phase of infection. The lowest concentration of SARS-CoV-2 viral copies this assay can detect is  138 copies/mL. A negative result does not preclude SARS-Cov-2 infection and should not be used as the sole basis for treatment or other patient management decisions. A negative result may occur with  improper specimen collection/handling, submission of specimen other than nasopharyngeal swab, presence of viral mutation(s) within the areas targeted by this assay, and inadequate number of viral copies(<138 copies/mL). A negative result must be combined with clinical observations, patient history, and epidemiological information. The expected result is Negative.  Fact Sheet for Patients:  bloggercourse.com  Fact Sheet for Healthcare Providers:  seriousbroker.it  This test is no t yet approved or cleared by the [place]  FDA and  has been authorized for detection and/or diagnosis of SARS-CoV-2 by FDA under an Emergency Use Authorization (EUA). This EUA will remain  in effect (meaning this test can be used) for the duration of the COVID-19 declaration under  Section 564(b)(1) of the Act, 21 U.S.C.section 360bbb-3(b)(1), unless the authorization is terminated  or revoked sooner.       Influenza A by PCR NEGATIVE NEGATIVE Final   Influenza B by PCR NEGATIVE NEGATIVE Final    Comment: (NOTE) The Xpert Xpress SARS-CoV-2/FLU/RSV plus assay is intended as an aid in the diagnosis of influenza from Nasopharyngeal swab specimens and should not be used as a sole basis for treatment. Nasal washings and aspirates are unacceptable for Xpert Xpress SARS-CoV-2/FLU/RSV testing.  Fact Sheet for Patients: bloggercourse.com  Fact Sheet for Healthcare Providers: seriousbroker.it  This test is not yet approved or cleared by the [place]  FDA and has been authorized for detection and/or diagnosis of SARS-CoV-2 by FDA under an Emergency Use Authorization (EUA). This EUA will remain in effect (meaning this test can be used) for the duration of the COVID-19 declaration under Section 564(b)(1) of the Act, 21 U.S.C. section 360bbb-3(b)(1), unless the authorization is terminated or revoked.     Resp Syncytial Virus by PCR NEGATIVE NEGATIVE Final    Comment: (NOTE) Fact Sheet for Patients: bloggercourse.com  Fact Sheet for Healthcare Providers: seriousbroker.it  This test is not yet approved or cleared by the [place]  FDA and has been authorized for detection and/or diagnosis of SARS-CoV-2 by FDA under an Emergency Use Authorization (EUA). This EUA will remain in effect (meaning this test can be used) for the duration of the COVID-19 declaration under Section 564(b)(1) of the Act, 21 U.S.C. section 360bbb-3(b)(1), unless the authorization is terminated or revoked.  Performed at [facility], [address], [place], [place] [address]          Radiology Studies: MR Brain W and Wo Contrast Result Date: 06/27/2024 EXAM: MRI BRAIN WITH AND  WITHOUT CONTRAST 06/27/2024 05:49:58 PM TECHNIQUE: Multiplanar multisequence MRI of the head/brain was performed with and without the administration of intravenous contrast. COMPARISON: MRI head 10/31/2023 CLINICAL HISTORY: Neuro deficit, acute, stroke suspected; HX MS, uri, new weakness- r/o MS flare FINDINGS: BRAIN AND VENTRICLES: No acute infarct. Unchanged advanced T2 hyperintensities in the white matter, compatible with multiple sclerosis. No new or enhancing lesions. No acute intracranial hemorrhage. No mass effect or midline shift. No hydrocephalus. The sella is unremarkable. Normal flow voids. No mass or abnormal enhancement. ORBITS: No acute abnormality. SINUSES: No acute abnormality. BONES AND SOFT TISSUES: Normal bone marrow signal and enhancement. No acute soft tissue abnormality. IMPRESSION: 1. Unchanged advanced T2 hyperintensities in the white matter, compatible with multiple sclerosis. No new or enhancing lesions. Electronically signed by: Gilmore Molt 06/27/2024 07:15 PM EST RP Workstation: [id number]  LOS: 1 day   Time spent= 35 mins    Deliliah Room, MD [facility]  If 7PM-7AM, please contact night-coverage  06/29/2024, 10:10 AM  "

## 2024-06-29 NOTE — [place] FL2 (Signed)
 " [place]  MEDICAID FL2 LEVEL OF CARE FORM     IDENTIFICATION  Patient Name: Pamela Roman Birthdate: [birth year] Sex: female Admission Date (Current Location): 06/27/2024  [facility] and [place] Number:  [employer] and Address:  [facility],  [address] [place] [address]      Provider Number: [id number]  Attending Physician Name and Address:  Dino Antu, MD  Relative Name and Phone Number:  [facility]  Sister, Emergency Contact  [phone]    Current Level of Care: Hospital Recommended Level of Care: Skilled Nursing Facility Prior Approval Number:    Date Approved/Denied:   PASRR Number: Pending  Discharge Plan: SNF    Current Diagnoses: Patient Active Problem List   Diagnosis Date Noted   Allergic dermatitis 03/15/2024   Postherpetic neuralgia 02/16/2024   Herpes zoster without complication 01/11/2024   Irritable bowel syndrome with diarrhea 01/11/2024   Acute gastritis without hemorrhage 01/11/2024   Generalized weakness 07/12/2023   Altered mental status 07/12/2023   Fever, unspecified 07/12/2023   Hypertrophic toenail 10/05/2022   Seasonal allergic rhinitis 10/05/2022   Multiple sclerosis exacerbation 06/15/2022   Bilateral leg weakness 06/14/2022   Physical deconditioning 02/25/2022   Hospital discharge follow-up 02/25/2022   MDD (major depressive disorder), recurrent episode, moderate (HCC) 02/25/2022   Encounter for examination following treatment at hospital 07/16/2021   Pharyngoesophageal dysphagia 03/12/2021   Migraine 08/09/2020   Hypothyroidism 08/09/2020   HLD (hyperlipidemia) 08/09/2020   Osteoporosis 08/09/2020   Multiple falls 08/09/2020   Weight loss 08/09/2020   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   Spinal stenosis of lumbar region 03/21/2019   Multiple sclerosis 11/15/2017   Thoracic compression fracture (HCC) 11/15/2017   Low back pain 11/15/2017    Orientation RESPIRATION BLADDER  Height & Weight     Self, Time, Situation, Place  Normal Incontinent Weight: 132 lb 4.4 oz (60 kg) Height:  5' 3 (160 cm)  BEHAVIORAL SYMPTOMS/MOOD NEUROLOGICAL BOWEL NUTRITION STATUS      Continent Diet (See DC summary)  AMBULATORY STATUS COMMUNICATION OF NEEDS Skin   Extensive Assist Verbally Normal                       Personal Care Assistance Level of Assistance  Bathing, Feeding, Dressing Bathing Assistance: Limited assistance Feeding assistance: Independent Dressing Assistance: Limited assistance     Functional Limitations Info  Sight, Hearing, Speech Sight Info: Adequate Hearing Info: Adequate Speech Info: Adequate    SPECIAL CARE FACTORS FREQUENCY  PT (By licensed PT), OT (By licensed OT)     PT Frequency: 5x/wk OT Frequency: 5x/wk            Contractures Contractures Info: Not present    Additional Factors Info  Code Status, Allergies, Psychotropic Code Status Info: FULL Allergies Info: Codeine Psychotropic Info: Zoloft          Current Medications (06/29/2024):  This is the current hospital active medication list Current Facility-Administered Medications  Medication Dose Route Frequency Provider Last Rate Last Admin   acetaminophen  (TYLENOL ) tablet 650 mg  650 mg Oral Q6H PRN Adefeso, Oladapo, DO       Or   acetaminophen  (TYLENOL ) suppository 650 mg  650 mg Rectal Q6H PRN Adefeso, Oladapo, DO       cholecalciferol  (VITAMIN D3) 25 MCG (1000 UNIT) tablet 5,000 Units  5,000 Units Oral Daily Rashid, Farhan, MD   5,000 Units at 06/29/24 9163   dextromethorphan -guaiFENesin  (MUCINEX  DM) 30-600 MG per  12 hr tablet 1 tablet  1 tablet Oral BID Adefeso, Oladapo, DO   1 tablet at 06/29/24 0914   enoxaparin  (LOVENOX ) injection 40 mg  40 mg Subcutaneous Q24H Adefeso, Oladapo, DO   40 mg at 06/28/24 2132   guaiFENesin -dextromethorphan  (ROBITUSSIN DM) 100-10 MG/5ML syrup 5 mL  5 mL Oral Q4H PRN Adefeso, Oladapo, DO       levothyroxine  (SYNTHROID ) tablet 50 mcg   50 mcg Oral Q0600 Adefeso, Oladapo, DO   50 mcg at 06/29/24 9490   ondansetron  (ZOFRAN ) tablet 4 mg  4 mg Oral Q6H PRN Adefeso, Oladapo, DO       Or   ondansetron  (ZOFRAN ) injection 4 mg  4 mg Intravenous Q6H PRN Adefeso, Oladapo, DO       Oral care mouth rinse  15 mL Mouth Rinse PRN Adefeso, Oladapo, DO       pantoprazole  (PROTONIX ) EC tablet 40 mg  40 mg Oral Daily Adefeso, Oladapo, DO   40 mg at 06/29/24 0836   sertraline  (ZOLOFT ) tablet 25 mg  25 mg Oral Daily Rashid, Farhan, MD   25 mg at 06/29/24 0836   simvastatin  (ZOCOR ) tablet 20 mg  20 mg Oral Daily Adefeso, Oladapo, DO   20 mg at 06/29/24 9163   zonisamide  (ZONEGRAN ) capsule 100 mg  100 mg Oral Daily Rashid, Farhan, MD   100 mg at 06/29/24 9163     Discharge Medications: Please see discharge summary for a list of discharge medications.  Relevant Imaging Results:  Relevant Lab Results:   Additional Information SSN: [id number]  Hoy DELENA Bigness, LCSW     "

## 2024-06-29 NOTE — Plan of Care (Signed)

## 2024-06-29 NOTE — TOC Progression Note (Signed)
 Transition of Care [facility]) - Progression Note    Patient Details  Name: Pamela Roman MRN: [id number] Date of Birth: 08-09-1955  Transition of Care [facility]) CM/SW Contact  Hoy DELENA Bigness, LCSW Phone Number: 06/29/2024, 10:45 AM  Clinical Narrative:    Pt does not feel safe to return home due to weakness and being unable to stand/transfer from bed. Pt agreeable to STR placement and does not have facility preference however, does not want referral sent to CV.  Referrals have been faxed out to [facility], [facility], [facility], and [facility].  Pt's PASRR currently pending review. Requested records have been uploaded.                      Expected Discharge Plan and Services                                               Social Drivers of Health (SDOH) Interventions SDOH Screenings   Food Insecurity: No Food Insecurity (06/27/2024)  Housing: Low Risk (06/27/2024)  Transportation Needs: No Transportation Needs (06/27/2024)  Utilities: Not At Risk (06/27/2024)  Alcohol Screen: Low Risk (12/14/2023)  Depression (PHQ2-9): Low Risk (03/15/2024)  Financial Resource Strain: Medium Risk (01/07/2024)  Physical Activity: Inactive (01/07/2024)  Social Connections: Moderately Isolated (01/07/2024)  Stress: Stress Concern Present (01/07/2024)  Tobacco Use: Low Risk (06/27/2024)  Health Literacy: Adequate Health Literacy (12/14/2023)    Readmission Risk Interventions    06/29/2024   10:45 AM  Readmission Risk Prevention Plan  Post Dischage Appt Complete  Medication Screening Complete  Transportation Screening Complete

## 2024-06-30 DIAGNOSIS — R531 Weakness: Secondary | ICD-10-CM | POA: Diagnosis not present

## 2024-06-30 NOTE — Plan of Care (Signed)
" °  Problem: Education: Goal: Knowledge of General Education information will improve Description: Including pain rating scale, medication(s)/side effects and non-pharmacologic comfort measures Outcome: Progressing   Problem: Clinical Measurements: Goal: Ability to maintain clinical measurements within normal limits will improve Outcome: Progressing Goal: Diagnostic test results will improve Outcome: Progressing Goal: Respiratory complications will improve Outcome: Progressing   Problem: Activity: Goal: Risk for activity intolerance will decrease Outcome: Progressing   Problem: Elimination: Goal: Will not experience complications related to bowel motility Outcome: Progressing Goal: Will not experience complications related to urinary retention Outcome: Progressing   Problem: Pain Managment: Goal: General experience of comfort will improve and/or be controlled Outcome: Progressing   Problem: Safety: Goal: Ability to remain free from injury will improve Outcome: Progressing   Problem: Skin Integrity: Goal: Risk for impaired skin integrity will decrease Outcome: Progressing   "

## 2024-06-30 NOTE — Plan of Care (Signed)
  Problem: Activity: Goal: Risk for activity intolerance will decrease Outcome: Progressing   Problem: Pain Managment: Goal: General experience of comfort will improve and/or be controlled Outcome: Progressing   Problem: Safety: Goal: Ability to remain free from injury will improve Outcome: Progressing   Problem: Skin Integrity: Goal: Risk for impaired skin integrity will decrease Outcome: Progressing

## 2024-06-30 NOTE — Progress Notes (Signed)
 " PROGRESS NOTE    Pamela Roman  FMW:[id number] DOB: April 29, 1956 DOA: 06/27/2024 PCP: Tobie Suzzane POUR, MD   Brief Narrative:    68 y.o. female with medical history significant of multiple sclerosis presents to the ED from home via EMS due to generalized weakness which started today.  Patient complained of 1 day onset of upper respiratory tract infection symptoms including nasal congestion and sore throat which worsened today with cough and chest congestion resulting in being unable to ambulate, stand or transition to wheelchair.  MRI of brain with and without contrast showed no new or enhancing lesions. Neurologist (Dr. Germaine) was consulted and considered this to be a multiple sclerosis pseudo flare.  Symptomatic treatment recommended. Pending placement to skilled nursing facility  Assessment & Plan:  Principal Problem:   Generalized weakness   Generalized weakness/ambulatory dysfunction/physical deconditioning Patient's weakness is likely due to viral upper respiratory tract infection Continue symptomatic treatment with Tylenol , Mucinex , Robitussin Continue fall precaution Continue PT/OT eval and treat.  She will need short-term rehab upon discharge.  Case management is working on it She goes to outpatient [facility] twice a week.   Multiple sclerosis pseudo flare in the setting of viral URI,POA: MRI of brain without any new or enhancing lesions. Continue PT/OT eval and treat Patient will continue outpatient neurology follow-up   Acquired hypothyroidism Continue Synthroid    GERD Continue Protonix    Mixed hyperlipidemia Continue statin  Migraines, POA: She take zonisamide  100 mg every day for migraine prevention and sumatriptan  100 mg for migraine rescue.  Vitamin D  deficiency, POA: Continue with vitamin D3 5000 international units daily.   Disposition: Needs skilled nursing facility placement.  She was living at home with her sister.  She does have a  wheelchair and rollator.  She has a caregiver that comes twice a week.  She goes to outpatient physical therapy at [facility] on Tuesdays and Fridays.  Because of her decreased strength and inability to stand up by herself, she will need short-term rehab placement.   DVT prophylaxis: enoxaparin  (LOVENOX ) injection 40 mg Start: 06/27/24 2215 SCDs Start: 06/27/24 2127     Code Status: Full Code Family Communication: None at the bedside Status is: inpt    Subjective:  No acute events overnight.  Denies any active complaints.  She is awaiting placement to skilled nursing facility.  Case management is working on it.  Physical exam: General exam: Appears calm and comfortable  Respiratory system: Clear to auscultation. Respiratory effort normal. Cardiovascular system: S1 & S2 heard, RRR. No JVD, murmurs, rubs, gallops or clicks. No pedal edema. Gastrointestinal system: Abdomen is nondistended, soft and nontender. No organomegaly or masses felt. Normal bowel sounds heard. Central nervous system: Alert and oriented. No focal neurological deficits. Extremities: Slightly reduced strength in both legs Skin: No rashes, lesions or ulcers Psychiatry: Judgement and insight appear normal. Mood & affect appropriate.     Diet Orders (From admission, onward)     Start     Ordered   06/27/24 2128  Diet Heart Room service appropriate? Yes; Fluid consistency: Thin  Diet effective now       Question Answer Comment  Room service appropriate? Yes   Fluid consistency: Thin      06/27/24 2127            Objective: Vitals:   06/29/24 1250 06/29/24 1900 06/29/24 1901 06/30/24 0500  BP: 118/73 94/61  (!) 104/55  Pulse: 92  81 70  Resp: 18  18  18  Temp: (!) 97.4 F (36.3 C) 98 F (36.7 C)  97.8 F (36.6 C)  TempSrc: Oral  Oral Oral  SpO2: 100%  97% 94%  Weight:      Height:        Intake/Output Summary (Last 24 hours) at 06/30/2024 0905 Last data filed at 06/29/2024 1652 Gross per 24  hour  Intake --  Output 100 ml  Net -100 ml   Filed Weights   06/27/24 1517 06/27/24 2043  Weight: 63.5 kg 60 kg    Scheduled Meds:  cholecalciferol   5,000 Units Oral Daily   dextromethorphan -guaiFENesin   1 tablet Oral BID   enoxaparin  (LOVENOX ) injection  40 mg Subcutaneous Q24H   levothyroxine   50 mcg Oral Q0600   pantoprazole   40 mg Oral Daily   sertraline   25 mg Oral Daily   simvastatin   20 mg Oral Daily   zonisamide   100 mg Oral Daily   Continuous Infusions:  Nutritional status     Body mass index is 23.43 kg/m.  Data Reviewed:   CBC: Recent Labs  Lab 06/27/24 1604 06/28/24 0431  WBC 6.3 6.0  NEUTROABS 5.3  --   HGB 13.9 12.8  HCT 41.7 38.3  MCV 93.3 94.6  PLT 170 158   Basic Metabolic Panel: Recent Labs  Lab 06/27/24 1604 06/28/24 0431  NA 142 140  K 3.8 3.7  CL 108 108  CO2 21* 18*  GLUCOSE 97 93  BUN 13 16  CREATININE 0.67 0.68  CALCIUM 9.2 8.8*  MG  --  2.2  PHOS  --  3.5   GFR: Estimated Creatinine Clearance: 55.7 mL/min (by C-G formula based on SCr of 0.68 mg/dL). Liver Function Tests: Recent Labs  Lab 06/28/24 0431  AST 17  ALT 9  ALKPHOS 110  BILITOT 0.7  PROT 6.5  ALBUMIN 4.1   No results for input(s): LIPASE, AMYLASE in the last 168 hours. No results for input(s): AMMONIA in the last 168 hours. Coagulation Profile: No results for input(s): INR, PROTIME in the last 168 hours. Cardiac Enzymes: No results for input(s): CKTOTAL, CKMB, CKMBINDEX, TROPONINI in the last 168 hours. BNP (last 3 results) No results for input(s): PROBNP in the last 8760 hours. HbA1C: No results for input(s): HGBA1C in the last 72 hours. CBG: No results for input(s): GLUCAP in the last 168 hours. Lipid Profile: No results for input(s): CHOL, HDL, LDLCALC, TRIG, CHOLHDL, LDLDIRECT in the last 72 hours. Thyroid Function Tests: No results for input(s): TSH, T4TOTAL, FREET4, T3FREE, THYROIDAB in the last  72 hours. Anemia Panel: No results for input(s): VITAMINB12, FOLATE, FERRITIN, TIBC, IRON, RETICCTPCT in the last 72 hours. Sepsis Labs: No results for input(s): PROCALCITON, LATICACIDVEN in the last 168 hours.  Recent Results (from the past 240 hours)  Resp panel by RT-PCR (RSV, Flu A&B, Covid) Anterior Nasal Swab     Status: None   Collection Time: 06/27/24  4:04 PM   Specimen: Anterior Nasal Swab  Result Value Ref Range Status   SARS Coronavirus 2 by RT PCR NEGATIVE NEGATIVE Final    Comment: (NOTE) SARS-CoV-2 target nucleic acids are NOT DETECTED.  The SARS-CoV-2 RNA is generally detectable in upper respiratory specimens during the acute phase of infection. The lowest concentration of SARS-CoV-2 viral copies this assay can detect is 138 copies/mL. A negative result does not preclude SARS-Cov-2 infection and should not be used as the sole basis for treatment or other patient management decisions. A negative result may occur with  improper specimen collection/handling, submission of specimen other than nasopharyngeal swab, presence of viral mutation(s) within the areas targeted by this assay, and inadequate number of viral copies(<138 copies/mL). A negative result must be combined with clinical observations, patient history, and epidemiological information. The expected result is Negative.  Fact Sheet for Patients:  bloggercourse.com  Fact Sheet for Healthcare Providers:  seriousbroker.it  This test is no t yet approved or cleared by the [place]  FDA and  has been authorized for detection and/or diagnosis of SARS-CoV-2 by FDA under an Emergency Use Authorization (EUA). This EUA will remain  in effect (meaning this test can be used) for the duration of the COVID-19 declaration under Section 564(b)(1) of the Act, 21 U.S.C.section 360bbb-3(b)(1), unless the authorization is terminated  or revoked sooner.        Influenza A by PCR NEGATIVE NEGATIVE Final   Influenza B by PCR NEGATIVE NEGATIVE Final    Comment: (NOTE) The Xpert Xpress SARS-CoV-2/FLU/RSV plus assay is intended as an aid in the diagnosis of influenza from Nasopharyngeal swab specimens and should not be used as a sole basis for treatment. Nasal washings and aspirates are unacceptable for Xpert Xpress SARS-CoV-2/FLU/RSV testing.  Fact Sheet for Patients: bloggercourse.com  Fact Sheet for Healthcare Providers: seriousbroker.it  This test is not yet approved or cleared by the [place]  FDA and has been authorized for detection and/or diagnosis of SARS-CoV-2 by FDA under an Emergency Use Authorization (EUA). This EUA will remain in effect (meaning this test can be used) for the duration of the COVID-19 declaration under Section 564(b)(1) of the Act, 21 U.S.C. section 360bbb-3(b)(1), unless the authorization is terminated or revoked.     Resp Syncytial Virus by PCR NEGATIVE NEGATIVE Final    Comment: (NOTE) Fact Sheet for Patients: bloggercourse.com  Fact Sheet for Healthcare Providers: seriousbroker.it  This test is not yet approved or cleared by the [place]  FDA and has been authorized for detection and/or diagnosis of SARS-CoV-2 by FDA under an Emergency Use Authorization (EUA). This EUA will remain in effect (meaning this test can be used) for the duration of the COVID-19 declaration under Section 564(b)(1) of the Act, 21 U.S.C. section 360bbb-3(b)(1), unless the authorization is terminated or revoked.  Performed at [facility], [address] [place], [place] [address]          Radiology Studies: No results found.          LOS: 2 days   Time spent= 35 mins    Deliliah Room, MD [facility]  If 7PM-7AM, please contact night-coverage  06/30/2024, 9:05 AM  "

## 2024-06-30 NOTE — Plan of Care (Signed)
" °  Problem: Education: Goal: Knowledge of General Education information will improve Description: Including pain rating scale, medication(s)/side effects and non-pharmacologic comfort measures Outcome: Progressing   Problem: Health Behavior/Discharge Planning: Goal: Ability to manage health-related needs will improve Outcome: Progressing   Problem: Clinical Measurements: Goal: Ability to maintain clinical measurements within normal limits will improve Outcome: Progressing Goal: Will remain free from infection Outcome: Not Applicable Goal: Diagnostic test results will improve Outcome: Progressing Goal: Cardiovascular complication will be avoided Outcome: Not Applicable   Problem: Activity: Goal: Risk for activity intolerance will decrease Outcome: Progressing   Problem: Nutrition: Goal: Adequate nutrition will be maintained Outcome: Not Applicable   Problem: Elimination: Goal: Will not experience complications related to bowel motility Outcome: Progressing Goal: Will not experience complications related to urinary retention Outcome: Progressing   Problem: Pain Managment: Goal: General experience of comfort will improve and/or be controlled Outcome: Progressing   Problem: Safety: Goal: Ability to remain free from injury will improve Outcome: Progressing   Problem: Skin Integrity: Goal: Risk for impaired skin integrity will decrease Outcome: Progressing   "

## 2024-06-30 NOTE — TOC Progression Note (Addendum)
 Transition of Care [facility]) - Progression Note    Patient Details  Name: Pamela Roman MRN: [id number] Date of Birth: 1955-09-23  Transition [facility]) CM/SW Contact  Hoy DELENA Bigness, LCSW Phone Number: 06/30/2024, 12:24 PM  Clinical Narrative:    Pt currently has no bed offers for SNF. CSW expanded facility search. ICM will continue to follow.   ADDENDUM 1:15pm: CSW met with pt to review bed offers for SNF. Pt accepted offer at [facility]. Pt will require one more midnight in hospital in order for insurance to cover SNF. [name] able to accept pt to their facility tomorrow. PASRR still pending.   [facility] [address] [place], [place] [address] [phone] Overall rating ?? [facility] [address] [place], [place] [address] [phone] Overall rating ?????                   Expected Discharge Plan and Services                                               Social Drivers of Health (SDOH) Interventions SDOH Screenings   Food Insecurity: No Food Insecurity (06/27/2024)  Housing: Low Risk (06/27/2024)  Transportation Needs: No Transportation Needs (06/27/2024)  Utilities: Not At Risk (06/27/2024)  Alcohol Screen: Low Risk (12/14/2023)  Depression (PHQ2-9): Low Risk (03/15/2024)  Financial Resource Strain: Medium Risk (01/07/2024)  Physical Activity: Inactive (01/07/2024)  Social Connections: Moderately Isolated (01/07/2024)  Stress: Stress Concern Present (01/07/2024)  Tobacco Use: Low Risk (06/27/2024)  Health Literacy: Adequate Health Literacy (12/14/2023)    Readmission Risk Interventions    06/29/2024   10:45 AM  Readmission Risk Prevention Plan  Post Dischage Appt Complete  Medication Screening Complete  Transportation Screening Complete

## 2024-07-01 MED ORDER — ALENDRONATE SODIUM 70 MG PO TABS: 70.0000 mg | ORAL_TABLET | ORAL | Status: AC

## 2024-07-01 NOTE — Discharge Instructions (Signed)
 [facility]

## 2024-07-01 NOTE — Progress Notes (Signed)
 Pt has been up in chair most of shift. Tolerates ambulation to BSC/toilet with one assist and FWW, but still unable to get up without assistance due to leg weakness. Pt aware of pending transfer to SNF rehab, that we are awaiting PASSAR completion and it may be Monday before she will be discharged. VSS, no n/v. Alert and cooperative.

## 2024-07-01 NOTE — Progress Notes (Signed)
 " PROGRESS NOTE    Pamela Roman  FMW:[id number] DOB: [birth year] DOA: 06/27/2024 PCP: Tobie Suzzane POUR, MD   Brief Narrative:    68 y.o. female with medical history significant of multiple sclerosis presents to the ED from home via EMS due to generalized weakness which started today.  Patient complained of 1 day onset of upper respiratory tract infection symptoms including nasal congestion and sore throat which worsened today with cough and chest congestion resulting in being unable to ambulate, stand or transition to wheelchair.  MRI of brain with and without contrast showed no new or enhancing lesions. Neurologist (Dr. Germaine) was consulted and considered this to be a multiple sclerosis pseudo flare.  Symptomatic treatment recommended.  Pending placement to skilled nursing facility. Medically ready.  Assessment & Plan:  Principal Problem:   Generalized weakness   Generalized weakness/ambulatory dysfunction/physical deconditioning Patient's weakness is likely due to viral upper respiratory tract infection Continue symptomatic treatment with Tylenol , Mucinex , Robitussin Continue fall precaution Continue PT/OT eval and treat.  She will need short-term rehab upon discharge.  Case management is working on it She goes to outpatient physical therapy at [facility] twice a week.   Multiple sclerosis pseudo flare in the setting of viral URI,POA: MRI of brain without any new or enhancing lesions. Continue PT/OT eval and treat Patient will continue outpatient neurology follow-up   Acquired hypothyroidism Continue Synthroid    GERD Continue Protonix    Mixed hyperlipidemia Continue statin  Migraines, POA: She take zonisamide  100 mg every day for migraine prevention and sumatriptan  100 mg for migraine rescue.  Vitamin D  deficiency, POA: Continue with vitamin D3 5000 international units daily.   Disposition: Needs skilled nursing facility placement [facility], likely on Monday)   She  was living at home with her sister.  She does have a wheelchair and rollator.  She has a caregiver that comes twice a week.  She goes to outpatient physical therapy at [facility] on Tuesdays and Fridays.  Because of her decreased strength and inability to stand up by herself, she will need short-term rehab placement.   DVT prophylaxis: enoxaparin  (LOVENOX ) injection 40 mg Start: 06/27/24 2215 SCDs Start: 06/27/24 2127     Code Status: Full Code Family Communication: None at the bedside Status is: inpt    Subjective:  No acute events overnight.  Denies any active complaints.  Feels that her strength in legs is improving.  Physical exam: General exam: Appears calm and comfortable  Respiratory system: Clear to auscultation. Respiratory effort normal. Cardiovascular system: S1 & S2 heard, RRR. No JVD, murmurs, rubs, gallops or clicks. No pedal edema. Gastrointestinal system: Abdomen is nondistended, soft and nontender. No organomegaly or masses felt. Normal bowel sounds heard. Central nervous system: Alert and oriented. No focal neurological deficits. Extremities: Slightly reduced strength in both legs Skin: No rashes, lesions or ulcers Psychiatry: Judgement and insight appear normal. Mood & affect appropriate.     Diet Orders (From admission, onward)     Start     Ordered   06/27/24 2128  Diet Heart Room service appropriate? Yes; Fluid consistency: Thin  Diet effective now       Question Answer Comment  Room service appropriate? Yes   Fluid consistency: Thin      06/27/24 2127            Objective: Vitals:   06/30/24 1624 06/30/24 2116 07/01/24 0507 07/01/24 0515  BP: 116/69 101/65 (!) 92/49 103/63  Pulse: 77 76 80   Resp: 18  16 18   Temp: 98.1 F (36.7 C) 97.8 F (36.6 C) 98.1 F (36.7 C)   TempSrc: Oral Oral Oral   SpO2: 100% 96% 96%   Weight:      Height:        Intake/Output Summary (Last 24 hours) at 07/01/2024 1209 Last data filed at 07/01/2024  1036 Gross per 24 hour  Intake 360 ml  Output --  Net 360 ml   Filed Weights   06/27/24 1517 06/27/24 2043  Weight: 63.5 kg 60 kg    Scheduled Meds:  cholecalciferol   5,000 Units Oral Daily   dextromethorphan -guaiFENesin   1 tablet Oral BID   enoxaparin  (LOVENOX ) injection  40 mg Subcutaneous Q24H   levothyroxine   50 mcg Oral Q0600   pantoprazole   40 mg Oral Daily   sertraline   25 mg Oral Daily   simvastatin   20 mg Oral Daily   zonisamide   100 mg Oral Daily   Continuous Infusions:  Nutritional status     Body mass index is 23.43 kg/m.  Data Reviewed:   CBC: Recent Labs  Lab 06/27/24 1604 06/28/24 0431  WBC 6.3 6.0  NEUTROABS 5.3  --   HGB 13.9 12.8  HCT 41.7 38.3  MCV 93.3 94.6  PLT 170 158   Basic Metabolic Panel: Recent Labs  Lab 06/27/24 1604 06/28/24 0431  NA 142 140  K 3.8 3.7  CL 108 108  CO2 21* 18*  GLUCOSE 97 93  BUN 13 16  CREATININE 0.67 0.68  CALCIUM 9.2 8.8*  MG  --  2.2  PHOS  --  3.5   GFR: Estimated Creatinine Clearance: 55.7 mL/min (by C-G formula based on SCr of 0.68 mg/dL). Liver Function Tests: Recent Labs  Lab 06/28/24 0431  AST 17  ALT 9  ALKPHOS 110  BILITOT 0.7  PROT 6.5  ALBUMIN 4.1   No results for input(s): LIPASE, AMYLASE in the last 168 hours. No results for input(s): AMMONIA in the last 168 hours. Coagulation Profile: No results for input(s): INR, PROTIME in the last 168 hours. Cardiac Enzymes: No results for input(s): CKTOTAL, CKMB, CKMBINDEX, TROPONINI in the last 168 hours. BNP (last 3 results) No results for input(s): PROBNP in the last 8760 hours. HbA1C: No results for input(s): HGBA1C in the last 72 hours. CBG: No results for input(s): GLUCAP in the last 168 hours. Lipid Profile: No results for input(s): CHOL, HDL, LDLCALC, TRIG, CHOLHDL, LDLDIRECT in the last 72 hours. Thyroid Function Tests: No results for input(s): TSH, T4TOTAL, FREET4, T3FREE,  THYROIDAB in the last 72 hours. Anemia Panel: No results for input(s): VITAMINB12, FOLATE, FERRITIN, TIBC, IRON, RETICCTPCT in the last 72 hours. Sepsis Labs: No results for input(s): PROCALCITON, LATICACIDVEN in the last 168 hours.  Recent Results (from the past 240 hours)  Resp panel by RT-PCR (RSV, Flu A&B, Covid) Anterior Nasal Swab     Status: None   Collection Time: 06/27/24  4:04 PM   Specimen: Anterior Nasal Swab  Result Value Ref Range Status   SARS Coronavirus 2 by RT PCR NEGATIVE NEGATIVE Final    Comment: (NOTE) SARS-CoV-2 target nucleic acids are NOT DETECTED.  The SARS-CoV-2 RNA is generally detectable in upper respiratory specimens during the acute phase of infection. The lowest concentration of SARS-CoV-2 viral copies this assay can detect is 138 copies/mL. A negative result does not preclude SARS-Cov-2 infection and should not be used as the sole basis for treatment or other patient management decisions. A negative result may occur  with  improper specimen collection/handling, submission of specimen other than nasopharyngeal swab, presence of viral mutation(s) within the areas targeted by this assay, and inadequate number of viral copies(<138 copies/mL). A negative result must be combined with clinical observations, patient history, and epidemiological information. The expected result is Negative.  Fact Sheet for Patients:  bloggercourse.com  Fact Sheet for Healthcare Providers:  seriousbroker.it  This test is no t yet approved or cleared by the [place]  FDA and  has been authorized for detection and/or diagnosis of SARS-CoV-2 by FDA under an Emergency Use Authorization (EUA). This EUA will remain  in effect (meaning this test can be used) for the duration of the COVID-19 declaration under Section 564(b)(1) of the Act, 21 U.S.C.section 360bbb-3(b)(1), unless the authorization is terminated   or revoked sooner.       Influenza A by PCR NEGATIVE NEGATIVE Final   Influenza B by PCR NEGATIVE NEGATIVE Final    Comment: (NOTE) The Xpert Xpress SARS-CoV-2/FLU/RSV plus assay is intended as an aid in the diagnosis of influenza from Nasopharyngeal swab specimens and should not be used as a sole basis for treatment. Nasal washings and aspirates are unacceptable for Xpert Xpress SARS-CoV-2/FLU/RSV testing.  Fact Sheet for Patients: bloggercourse.com  Fact Sheet for Healthcare Providers: seriousbroker.it  This test is not yet approved or cleared by the [place]  FDA and has been authorized for detection and/or diagnosis of SARS-CoV-2 by FDA under an Emergency Use Authorization (EUA). This EUA will remain in effect (meaning this test can be used) for the duration of the COVID-19 declaration under Section 564(b)(1) of the Act, 21 U.S.C. section 360bbb-3(b)(1), unless the authorization is terminated or revoked.     Resp Syncytial Virus by PCR NEGATIVE NEGATIVE Final    Comment: (NOTE) Fact Sheet for Patients: bloggercourse.com  Fact Sheet for Healthcare Providers: seriousbroker.it  This test is not yet approved or cleared by the [place]  FDA and has been authorized for detection and/or diagnosis of SARS-CoV-2 by FDA under an Emergency Use Authorization (EUA). This EUA will remain in effect (meaning this test can be used) for the duration of the COVID-19 declaration under Section 564(b)(1) of the Act, 21 U.S.C. section 360bbb-3(b)(1), unless the authorization is terminated or revoked.  Performed at [facility], [address], [place], [place] [address]          Radiology Studies: No results found.          LOS: 3 days   Time spent= 35 mins    Deliliah Room, MD [facility]  If 7PM-7AM, please contact night-coverage  07/01/2024, 12:09  PM  "

## 2024-07-01 NOTE — Plan of Care (Signed)
" °  Problem: Education: Goal: Knowledge of General Education information will improve Description: Including pain rating scale, medication(s)/side effects and non-pharmacologic comfort measures Outcome: Progressing   Problem: Health Behavior/Discharge Planning: Goal: Ability to manage health-related needs will improve Outcome: Progressing   Problem: Clinical Measurements: Goal: Ability to maintain clinical measurements within normal limits will improve Outcome: Progressing Goal: Diagnostic test results will improve Outcome: Progressing Goal: Respiratory complications will improve Outcome: Progressing   Problem: Activity: Goal: Risk for activity intolerance will decrease Outcome: Progressing   Problem: Elimination: Goal: Will not experience complications related to bowel motility Outcome: Progressing Goal: Will not experience complications related to urinary retention Outcome: Progressing   Problem: Pain Managment: Goal: General experience of comfort will improve and/or be controlled Outcome: Progressing   Problem: Safety: Goal: Ability to remain free from injury will improve Outcome: Progressing   Problem: Skin Integrity: Goal: Risk for impaired skin integrity will decrease Outcome: Progressing   "

## 2024-07-02 DIAGNOSIS — G43809 Other migraine, not intractable, without status migrainosus: Secondary | ICD-10-CM | POA: Diagnosis not present

## 2024-07-02 DIAGNOSIS — G35D Multiple sclerosis, unspecified: Secondary | ICD-10-CM | POA: Diagnosis not present

## 2024-07-02 NOTE — [place] FL2 (Signed)
 " [place]  MEDICAID FL2 LEVEL OF CARE FORM     IDENTIFICATION  Patient Name: Pamela Roman Birthdate: Mar 24, 1956 Sex: female Admission Date (Current Location): 06/27/2024  [facility] and [place] Number:  [employer] and Address:  [facility],  [address], [place] [address]      Provider Number: [id number]  Attending Physician Name and Address:  Arlon Carliss ORN, DO  Relative Name and Phone Number:  [facility]  Sister, Emergency Contact  [phone]    Current Level of Care: Hospital Recommended Level of Care: Skilled Nursing Facility Prior Approval Number:    Date Approved/Denied:   PASRR Number: [id number]  Discharge Plan: SNF    Current Diagnoses: Patient Active Problem List   Diagnosis Date Noted   Allergic dermatitis 03/15/2024   Postherpetic neuralgia 02/16/2024   Herpes zoster without complication 01/11/2024   Irritable bowel syndrome with diarrhea 01/11/2024   Acute gastritis without hemorrhage 01/11/2024   Generalized weakness 07/12/2023   Altered mental status 07/12/2023   Fever, unspecified 07/12/2023   Hypertrophic toenail 10/05/2022   Seasonal allergic rhinitis 10/05/2022   Exacerbation of multiple sclerosis 06/15/2022   Bilateral leg weakness 06/14/2022   Physical deconditioning 02/25/2022   Hospital discharge follow-up 02/25/2022   MDD (major depressive disorder), recurrent episode, moderate (HCC) 02/25/2022   Encounter for examination following treatment at hospital 07/16/2021   Pharyngoesophageal dysphagia 03/12/2021   Migraine 08/09/2020   Hypothyroidism 08/09/2020   HLD (hyperlipidemia) 08/09/2020   Osteoporosis 08/09/2020   Multiple falls 08/09/2020   Weight loss 08/09/2020   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   Spinal stenosis of lumbar region 03/21/2019   Multiple sclerosis 11/15/2017   Thoracic compression fracture (HCC) 11/15/2017   Low back pain 11/15/2017    Orientation RESPIRATION  BLADDER Height & Weight     Self, Time, Situation, Place  Normal Incontinent Weight: 60 kg Height:  5' 3 (160 cm)  BEHAVIORAL SYMPTOMS/MOOD NEUROLOGICAL BOWEL NUTRITION STATUS      Continent Diet (See DC summary)  AMBULATORY STATUS COMMUNICATION OF NEEDS Skin   Extensive Assist Verbally Normal                       Personal Care Assistance Level of Assistance  Bathing, Feeding, Dressing Bathing Assistance: Limited assistance Feeding assistance: Independent Dressing Assistance: Limited assistance     Functional Limitations Info  Sight, Hearing, Speech Sight Info: Adequate Hearing Info: Adequate Speech Info: Adequate    SPECIAL CARE FACTORS FREQUENCY  PT (By licensed PT), OT (By licensed OT)     PT Frequency: 5x/wk OT Frequency: 5x/wk            Contractures Contractures Info: Not present    Additional Factors Info  Code Status, Allergies, Psychotropic Code Status Info: FULL Allergies Info: Codeine Psychotropic Info: Zoloft          Current Medications (07/02/2024):  This is the current hospital active medication list Current Facility-Administered Medications  Medication Dose Route Frequency Provider Last Rate Last Admin   acetaminophen  (TYLENOL ) tablet 650 mg  650 mg Oral Q6H PRN Adefeso, Oladapo, DO       Or   acetaminophen  (TYLENOL ) suppository 650 mg  650 mg Rectal Q6H PRN Adefeso, Oladapo, DO       cholecalciferol  (VITAMIN D3) 25 MCG (1000 UNIT) tablet 5,000 Units  5,000 Units Oral Daily Rashid, Farhan, MD   5,000 Units at 07/02/24 9176   dextromethorphan -guaiFENesin  (MUCINEX  DM) 30-600 MG per 12 hr  tablet 1 tablet  1 tablet Oral BID Adefeso, Oladapo, DO   1 tablet at 07/02/24 9175   enoxaparin  (LOVENOX ) injection 40 mg  40 mg Subcutaneous Q24H Adefeso, Oladapo, DO   40 mg at 07/01/24 2129   guaiFENesin -dextromethorphan  (ROBITUSSIN DM) 100-10 MG/5ML syrup 5 mL  5 mL Oral Q4H PRN Adefeso, Oladapo, DO       levothyroxine  (SYNTHROID ) tablet 50 mcg  50 mcg  Oral Q0600 Adefeso, Oladapo, DO   50 mcg at 07/02/24 0616   ondansetron  (ZOFRAN ) tablet 4 mg  4 mg Oral Q6H PRN Adefeso, Oladapo, DO       Or   ondansetron  (ZOFRAN ) injection 4 mg  4 mg Intravenous Q6H PRN Adefeso, Oladapo, DO       Oral care mouth rinse  15 mL Mouth Rinse PRN Adefeso, Oladapo, DO       pantoprazole  (PROTONIX ) EC tablet 40 mg  40 mg Oral Daily Adefeso, Oladapo, DO   40 mg at 07/02/24 0824   sertraline  (ZOLOFT ) tablet 25 mg  25 mg Oral Daily Rashid, Farhan, MD   25 mg at 07/02/24 9175   simvastatin  (ZOCOR ) tablet 20 mg  20 mg Oral Daily Adefeso, Oladapo, DO   20 mg at 07/02/24 9175   zonisamide  (ZONEGRAN ) capsule 100 mg  100 mg Oral Daily Rashid, Farhan, MD   100 mg at 07/02/24 9175     Discharge Medications: Please see discharge summary for a list of discharge medications.  Relevant Imaging Results:  Relevant Lab Results:   Additional Information SSN: [id number]  Ronnald MARLA Sil, RN     "

## 2024-07-02 NOTE — Progress Notes (Signed)
 " Progress Note   Patient: Pamela Roman FMW:[id number] DOB: Feb 28, 1956 DOA: 06/27/2024  DOS: the patient was seen and examined on 07/02/2024   Brief hospital course:  68 y.o. female with medical history significant of multiple sclerosis presents to the ED from home via EMS due to generalized weakness   Assessment and Plan:  Concern for flare/pseudo flare of multiple sclerosis - Likely secondary to viral URI.  MRI brain without any new or enhancing lesions.  Showing improvement in weakness.  Neurology consulted considered to be MS pseudo flare.  Symptomatic treatment on board.  Physical debilitation and muscle weakness - Secondary to above.  Showing improvement.  Evaluated by PT/OT.  Recommending STR.  Upper respiratory infection (POA) - Nasal congestion and sore throat with cough on presentation.  Symptomatic therapy on board.  Showing improvement.  Hypothyroidism/GERD/hyperlipidemia - Resume home medication regiment  Migraines - Takes zonisamide  100 mg daily and sumatriptan  100 mg for migraine rescue.  Goals of care - Disposition likely to SNF tomorrow.   Subjective: Patient sitting in bed, alert, pleasant, in good spirits.  States she is feeling improved, less weak.  Motivated to transition to STR to get stronger.  Denies any worsening fever, weakness, shortness of breath, chest pain, nausea, vomiting, abdominal pain.  Physical Exam:  Vitals:   07/01/24 0515 07/01/24 1312 07/01/24 2035 07/02/24 0500  BP: 103/63 112/68 111/73 115/70  Pulse:  70 71 70  Resp:  16 18 20   Temp:  (!) 97.5 F (36.4 C) 98.2 F (36.8 C) 98 F (36.7 C)  TempSrc:  Oral Oral Oral  SpO2:  100% 98% 95%  Weight:      Height:        GENERAL:  Alert, pleasant, no acute distress  HEENT:  EOMI CARDIOVASCULAR:  RRR, no murmurs appreciated RESPIRATORY:  Clear to auscultation, no wheezing, rales, or rhonchi GASTROINTESTINAL:  Soft, nontender, nondistended EXTREMITIES:  No LE edema  bilaterally NEURO:  No new focal deficits appreciated SKIN:  No rashes noted PSYCH:  Appropriate mood and affect     Data Reviewed:  Imaging Studies: MR Brain W and Wo Contrast Result Date: 06/27/2024 EXAM: MRI BRAIN WITH AND WITHOUT CONTRAST 06/27/2024 05:49:58 PM TECHNIQUE: Multiplanar multisequence MRI of the head/brain was performed with and without the administration of intravenous contrast. COMPARISON: MRI head 10/31/2023 CLINICAL HISTORY: Neuro deficit, acute, stroke suspected; HX MS, uri, new weakness- r/o MS flare FINDINGS: BRAIN AND VENTRICLES: No acute infarct. Unchanged advanced T2 hyperintensities in the white matter, compatible with multiple sclerosis. No new or enhancing lesions. No acute intracranial hemorrhage. No mass effect or midline shift. No hydrocephalus. The sella is unremarkable. Normal flow voids. No mass or abnormal enhancement. ORBITS: No acute abnormality. SINUSES: No acute abnormality. BONES AND SOFT TISSUES: Normal bone marrow signal and enhancement. No acute soft tissue abnormality. IMPRESSION: 1. Unchanged advanced T2 hyperintensities in the white matter, compatible with multiple sclerosis. No new or enhancing lesions. Electronically signed by: Gilmore Molt 06/27/2024 07:15 PM EST RP Workstation: [id number]    There are no new results to review at this time.  Previous records (including but not limited to H&P, progress notes, nursing notes, TOC management) were reviewed in assessment of this patient.  Labs: CBC: Recent Labs  Lab 06/27/24 1604 06/28/24 0431  WBC 6.3 6.0  NEUTROABS 5.3  --   HGB 13.9 12.8  HCT 41.7 38.3  MCV 93.3 94.6  PLT 170 158   Basic Metabolic Panel: Recent Labs  Lab 06/27/24 1604  06/28/24 0431  NA 142 140  K 3.8 3.7  CL 108 108  CO2 21* 18*  GLUCOSE 97 93  BUN 13 16  CREATININE 0.67 0.68  CALCIUM 9.2 8.8*  MG  --  2.2  PHOS  --  3.5   Liver Function Tests: Recent Labs  Lab 06/28/24 0431  AST 17  ALT 9   ALKPHOS 110  BILITOT 0.7  PROT 6.5  ALBUMIN 4.1   CBG: No results for input(s): GLUCAP in the last 168 hours.  Scheduled Meds:  cholecalciferol   5,000 Units Oral Daily   dextromethorphan -guaiFENesin   1 tablet Oral BID   enoxaparin  (LOVENOX ) injection  40 mg Subcutaneous Q24H   levothyroxine   50 mcg Oral Q0600   pantoprazole   40 mg Oral Daily   sertraline   25 mg Oral Daily   simvastatin   20 mg Oral Daily   zonisamide   100 mg Oral Daily   Continuous Infusions: PRN Meds:.acetaminophen  **OR** acetaminophen , guaiFENesin -dextromethorphan , ondansetron  **OR** ondansetron  (ZOFRAN ) IV, mouth rinse  Family Communication: Not at bedside  Disposition: Status is: Inpatient Remains inpatient appropriate because: MS pseudo flare      Time spent: 34 minutes  Length of inpatient stay: 4 days  Author: Carliss LELON Canales, DO 07/02/2024 10:19 AM  For on call review [email or web address].   "

## 2024-07-02 NOTE — TOC Progression Note (Signed)
 Transition of Care [facility]) - Progression Note    Patient Details  Name: Pamela Roman MRN: [id number] Date of Birth: [birth year]  Transition of Care [facility]) CM/SW Contact  Ronnald MARLA Sil, RN Phone Number: 07/02/2024, 11:14 AM  Clinical Narrative:    CM obtained PASRR# [id number], updated same on FL2 and generated new Note for Co-Sign by Dr Rosetta.  CM Team will continue to follow along and assist as appropriate with the discharge plan and associated arrangements    Expected Discharge Plan and Services Transitioning to [facility] for short-term rehab tomorrow, Monday, 12/29.  Social Drivers of Health (SDOH) Interventions SDOH Screenings   Food Insecurity: No Food Insecurity (06/27/2024)  Housing: Low Risk (06/27/2024)  Transportation Needs: No Transportation Needs (06/27/2024)  Utilities: Not At Risk (06/27/2024)  Alcohol Screen: Low Risk (12/14/2023)  Depression (PHQ2-9): Low Risk (03/15/2024)  Financial Resource Strain: Medium Risk (01/07/2024)  Physical Activity: Inactive (01/07/2024)  Social Connections: Moderately Isolated (01/07/2024)  Stress: Stress Concern Present (01/07/2024)  Tobacco Use: Low Risk (06/27/2024)  Health Literacy: Adequate Health Literacy (12/14/2023)    Readmission Risk Interventions    06/29/2024   10:45 AM  Readmission Risk Prevention Plan  Post Dischage Appt Complete  Medication Screening Complete  Transportation Screening Complete

## 2024-07-02 NOTE — Plan of Care (Signed)
" °  Problem: Education: Goal: Knowledge of General Education information will improve Description: Including pain rating scale, medication(s)/side effects and non-pharmacologic comfort measures Outcome: Progressing   Problem: Health Behavior/Discharge Planning: Goal: Ability to manage health-related needs will improve Outcome: Progressing   Problem: Clinical Measurements: Goal: Ability to maintain clinical measurements within normal limits will improve Outcome: Progressing Goal: Diagnostic test results will improve Outcome: Progressing Goal: Respiratory complications will improve Outcome: Progressing   Problem: Activity: Goal: Risk for activity intolerance will decrease Outcome: Progressing   Problem: Elimination: Goal: Will not experience complications related to bowel motility Outcome: Progressing Goal: Will not experience complications related to urinary retention Outcome: Progressing   Problem: Pain Managment: Goal: General experience of comfort will improve and/or be controlled Outcome: Progressing   Problem: Safety: Goal: Ability to remain free from injury will improve Outcome: Progressing   Problem: Skin Integrity: Goal: Risk for impaired skin integrity will decrease Outcome: Progressing   "

## 2024-07-03 DIAGNOSIS — G35D Multiple sclerosis, unspecified: Secondary | ICD-10-CM | POA: Diagnosis not present

## 2024-07-03 DIAGNOSIS — G43809 Other migraine, not intractable, without status migrainosus: Secondary | ICD-10-CM | POA: Diagnosis not present

## 2024-07-03 DIAGNOSIS — R531 Weakness: Secondary | ICD-10-CM | POA: Diagnosis not present

## 2024-07-03 DIAGNOSIS — E039 Hypothyroidism, unspecified: Secondary | ICD-10-CM | POA: Diagnosis not present

## 2024-07-03 NOTE — Progress Notes (Signed)
 Mobility Specialist Progress Note:    07/03/24 1120  Mobility  Activity Pivoted/transferred to/from [facility]  Level of Assistance Minimal assist, patient does 75% or more  Assistive Device None  Distance Ambulated (ft) 2 ft  Range of Motion/Exercises Active;All extremities  Activity Response Tolerated well  Mobility Referral Yes  Mobility visit 1 Mobility  Mobility Specialist Start Time (ACUTE ONLY) 1120  Mobility Specialist Stop Time (ACUTE ONLY) 1135  Mobility Specialist Time Calculation (min) (ACUTE ONLY) 15 min   Pt received sitting EOB, requesting assistance to [facility]. Required MinA to stand and pivot with no AD. Tolerated well, asx throughout. NT in room, all needs met.  Ingram Onnen Mobility Specialist Please contact via Special Educational Needs Teacher or  Rehab office at [phone]

## 2024-07-03 NOTE — Discharge Summary (Signed)
 " Physician Discharge Summary   Patient: Pamela Roman MRN: [id number] DOB: 03/08/1956  Admit date:     06/27/2024  Discharge date: 07/03/2024  Discharge Physician: Carliss LELON Canales   PCP: Tobie Suzzane POUR, MD   Recommendations at discharge:    Pt to be discharged to [facility].   If you experience worsening fever, chills, chest pain, shortness of breath, or other concerning symptoms, please call your PCP or go to the emergency department immediately.  Discharge Diagnoses: Principal Problem:   Exacerbation of multiple sclerosis Active Problems:   Migraine   Hypothyroidism   Multiple sclerosis   Generalized weakness  Resolved Problems:   * No resolved hospital problems. *   Hospital Course:  68 y.o. female with medical history significant of multiple sclerosis presents to the ED from home via EMS due to generalized weakness    Assessment and Plan:   Concern for flare/pseudo flare of multiple sclerosis - Likely secondary to viral URI.  MRI brain without any new or enhancing lesions.  Showing improvement in weakness.  Neurology consulted considered to be MS pseudo flare.  Appears to be resolved.   Physical debilitation and muscle weakness - Secondary to above.  Showing improvement.  Evaluated by PT/OT.  Recommending STR.   Upper respiratory infection (POA) - Nasal congestion and sore throat with cough on presentation.  Appears resolved at this time.  Hypothyroidism/GERD/hyperlipidemia - Resume home medication regiment   Migraines - Takes zonisamide  100 mg daily and sumatriptan  100 mg for migraine rescue.   Consultants: Neurology Procedures performed: None Disposition: Skilled nursing facility Diet recommendation:  Cardiac diet  DISCHARGE MEDICATION: Allergies as of 07/03/2024       Reactions   Codeine Nausea And Vomiting        Medication List     STOP taking these medications    diazepam  5 MG tablet Commonly known as: VALIUM        TAKE these  medications    acetaminophen  325 MG tablet Commonly known as: TYLENOL  Take 2 tablets (650 mg total) by mouth every 6 (six) hours as needed for mild pain (pain score 1-3), fever or headache.   alendronate  70 MG tablet Commonly known as: FOSAMAX  Take 1 tablet (70 mg total) by mouth once a week. Take with a full glass of water on an empty stomach. What changed: See the new instructions.   BETA CAROTENE PO Take 1 capsule by mouth in the morning. Vitamin A, unknown strength.   levothyroxine  50 MCG tablet Commonly known as: SYNTHROID  TAKE 1 TABLET DAILY BEFORE BREAKFAST   One-A-Day Womens 50 Plus Tabs Take 1 tablet by mouth in the morning.   sertraline  25 MG tablet Commonly known as: ZOLOFT  Take 1 tablet (25 mg total) by mouth daily.   simvastatin  20 MG tablet Commonly known as: ZOCOR  TAKE 1 TABLET DAILY   SUMAtriptan  100 MG tablet Commonly known as: IMITREX  TAKE 1 TABLET EVERY 2 HOURS AS NEEDED FOR MIGRAINE. MAY REPEAT IN 2 HOURS IF HEADACHE PERSISTS OR RECURS   Vitamin D3 125 MCG (5000 UT) Caps Take 1 capsule (5,000 Units total) by mouth daily.   zonisamide  100 MG capsule Commonly known as: ZONEGRAN  Take 1 capsule (100 mg total) by mouth daily.        Contact information for follow-up providers     Tobie Suzzane POUR, MD. Schedule an appointment as soon as possible for a visit in 1 week(s).   Specialty: Internal Medicine Contact information: [address]  [place] [address] [phone]         Skeet Juliene SAUNDERS, DO. Schedule an appointment as soon as possible for a visit in 1 month(s).   Specialty: Neurology Contact information: [address]  [address] [place] [address] [phone]              Contact information for after-discharge care     Destination     [facility] .   Service: Skilled Nursing Contact information: [address] [place]  [address] [phone]                      Discharge Exam: Fredricka Weights   06/27/24 1517 06/27/24 2043  Weight: 63.5 kg 60 kg    GENERAL:  Alert, pleasant, no acute distress  HEENT:  EOMI CARDIOVASCULAR:  RRR, no murmurs appreciated RESPIRATORY:  Clear to auscultation, no wheezing, rales, or rhonchi GASTROINTESTINAL:  Soft, nontender, nondistended EXTREMITIES:  No LE edema bilaterally NEURO:  No new focal deficits appreciated SKIN:  No rashes noted PSYCH:  Appropriate mood and affect    Condition at discharge: improving  The results of significant diagnostics from this hospitalization (including imaging, microbiology, ancillary and laboratory) are listed below for reference.   Imaging Studies: MR Brain W and Wo Contrast Result Date: 06/27/2024 EXAM: MRI BRAIN WITH AND WITHOUT CONTRAST 06/27/2024 05:49:58 PM TECHNIQUE: Multiplanar multisequence MRI of the head/brain was performed with and without the administration of intravenous contrast. COMPARISON: MRI head 10/31/2023 CLINICAL HISTORY: Neuro deficit, acute, stroke suspected; HX MS, uri, new weakness- r/o MS flare FINDINGS: BRAIN AND VENTRICLES: No acute infarct. Unchanged advanced T2 hyperintensities in the white matter, compatible with multiple sclerosis. No new or enhancing lesions. No acute intracranial hemorrhage. No mass effect or midline shift. No hydrocephalus. The sella is unremarkable. Normal flow voids. No mass or abnormal enhancement. ORBITS: No acute abnormality. SINUSES: No acute abnormality. BONES AND SOFT TISSUES: Normal bone marrow signal and enhancement. No acute soft tissue abnormality. IMPRESSION: 1. Unchanged advanced T2 hyperintensities in the white matter, compatible with multiple sclerosis. No new or enhancing lesions. Electronically signed by: Gilmore Molt 06/27/2024 07:15 PM EST RP Workstation: [id number]    Microbiology: Results for orders placed or performed during the hospital encounter of 06/27/24  Resp panel by RT-PCR (RSV, Flu  A&B, Covid) Anterior Nasal Swab     Status: None   Collection Time: 06/27/24  4:04 PM   Specimen: Anterior Nasal Swab  Result Value Ref Range Status   SARS Coronavirus 2 by RT PCR NEGATIVE NEGATIVE Final    Comment: (NOTE) SARS-CoV-2 target nucleic acids are NOT DETECTED.  The SARS-CoV-2 RNA is generally detectable in upper respiratory specimens during the acute phase of infection. The lowest concentration of SARS-CoV-2 viral copies this assay can detect is 138 copies/mL. A negative result does not preclude SARS-Cov-2 infection and should not be used as the sole basis for treatment or other patient management decisions. A negative result may occur with  improper specimen collection/handling, submission of specimen other than nasopharyngeal swab, presence of viral mutation(s) within the areas targeted by this assay, and inadequate number of viral copies(<138 copies/mL). A negative result must be combined with clinical observations, patient history, and epidemiological information. The expected result is Negative.  Fact Sheet for Patients:  bloggercourse.com  Fact Sheet for Healthcare Providers:  seriousbroker.it  This test is no t yet approved or cleared by the [place]  FDA and  has been  authorized for detection and/or diagnosis of SARS-CoV-2 by FDA under an Emergency Use Authorization (EUA). This EUA will remain  in effect (meaning this test can be used) for the duration of the COVID-19 declaration under Section 564(b)(1) of the Act, 21 U.S.C.section 360bbb-3(b)(1), unless the authorization is terminated  or revoked sooner.       Influenza A by PCR NEGATIVE NEGATIVE Final   Influenza B by PCR NEGATIVE NEGATIVE Final    Comment: (NOTE) The Xpert Xpress SARS-CoV-2/FLU/RSV plus assay is intended as an aid in the diagnosis of influenza from Nasopharyngeal swab specimens and should not be used as a sole basis for treatment.  Nasal washings and aspirates are unacceptable for Xpert Xpress SARS-CoV-2/FLU/RSV testing.  Fact Sheet for Patients: bloggercourse.com  Fact Sheet for Healthcare Providers: seriousbroker.it  This test is not yet approved or cleared by the [place]  FDA and has been authorized for detection and/or diagnosis of SARS-CoV-2 by FDA under an Emergency Use Authorization (EUA). This EUA will remain in effect (meaning this test can be used) for the duration of the COVID-19 declaration under Section 564(b)(1) of the Act, 21 U.S.C. section 360bbb-3(b)(1), unless the authorization is terminated or revoked.     Resp Syncytial Virus by PCR NEGATIVE NEGATIVE Final    Comment: (NOTE) Fact Sheet for Patients: bloggercourse.com  Fact Sheet for Healthcare Providers: seriousbroker.it  This test is not yet approved or cleared by the [place]  FDA and has been authorized for detection and/or diagnosis of SARS-CoV-2 by FDA under an Emergency Use Authorization (EUA). This EUA will remain in effect (meaning this test can be used) for the duration of the COVID-19 declaration under Section 564(b)(1) of the Act, 21 U.S.C. section 360bbb-3(b)(1), unless the authorization is terminated or revoked.  Performed at [facility], [address], [place], [place] [address]     Labs: CBC: Recent Labs  Lab 06/27/24 1604 06/28/24 0431  WBC 6.3 6.0  NEUTROABS 5.3  --   HGB 13.9 12.8  HCT 41.7 38.3  MCV 93.3 94.6  PLT 170 158   Basic Metabolic Panel: Recent Labs  Lab 06/27/24 1604 06/28/24 0431  NA 142 140  K 3.8 3.7  CL 108 108  CO2 21* 18*  GLUCOSE 97 93  BUN 13 16  CREATININE 0.67 0.68  CALCIUM 9.2 8.8*  MG  --  2.2  PHOS  --  3.5   Liver Function Tests: Recent Labs  Lab 06/28/24 0431  AST 17  ALT 9  ALKPHOS 110  BILITOT 0.7  PROT 6.5  ALBUMIN 4.1   CBG: No results for  input(s): GLUCAP in the last 168 hours.  Discharge time spent: 26 minutes.  Length of inpatient stay: 5 days  Signed: Carliss LELON Canales, DO [facility] 07/03/2024         "

## 2024-07-03 NOTE — Progress Notes (Signed)
 Mobility Specialist Progress Note:    07/03/24 1400  Mobility  Activity Pivoted/transferred to/from [facility]  Level of Assistance Minimal assist, patient does 75% or more  Assistive Device None  Distance Ambulated (ft) 3 ft  Range of Motion/Exercises Active;All extremities  Activity Response Tolerated well  Mobility Referral Yes  Mobility visit 1 Mobility  Mobility Specialist Start Time (ACUTE ONLY) 1400  Mobility Specialist Stop Time (ACUTE ONLY) 1413  Mobility Specialist Time Calculation (min) (ACUTE ONLY) 13 min   Pt received sitting EOB, requesting assistance to [facility]. Required MinA to stand and pivot with no AD. Tolerated well, asx throughout. Left on BSC, call bell in reach. All needs met.  Pamela Roman Mobility Specialist Please contact via Special Educational Needs Teacher or  Rehab office at [phone]

## 2024-07-03 NOTE — Care Management Important Message (Signed)
 Important Message  Patient Details  Name: Pamela Roman MRN: [id number] Date of Birth: 15-Feb-1956   Important Message Given:  Yes - Medicare IM     Tiffannie Sloss L Gaines Cartmell 07/03/2024, 11:48 AM

## 2024-07-03 NOTE — Progress Notes (Signed)
 Patient being discharged to [facility] called and given to [facility] .[facility] to transport patient to awaiting facility.

## 2024-07-03 NOTE — TOC Transition Note (Signed)
 Transition of Care [facility]) - Discharge Note   Patient Details  Name: Pamela Roman MRN: [id number] Date of Birth: Mar 17, 1956  Transition of Care [facility]) CM/SW Contact:  Noreen KATHEE Cleotilde ISRAEL Phone Number: 07/03/2024, 10:05 AM   Clinical Narrative:     Patient is DC today to [facility]. CSW sent DC summary and order to facility via HUB. Room and report number provided to nurse, med necessity completed and EMS called. CSW signing off.  Final next level of care: Skilled Nursing Facility Barriers to Discharge: Barriers Resolved   Patient Goals and CMS Choice Patient states their goals for this hospitalization and ongoing recovery are:: Get get better          Discharge Placement              Patient chooses bed at: [facility] Patient to be transferred to facility by: [facility] Name of family member notified: Keeley Patient and family notified of of transfer: 07/03/24  Discharge Plan and Services Additional resources added to the After Visit Summary for                                       Social Drivers of Health (SDOH) Interventions SDOH Screenings   Food Insecurity: No Food Insecurity (06/27/2024)  Housing: Low Risk (06/27/2024)  Transportation Needs: No Transportation Needs (06/27/2024)  Utilities: Not At Risk (06/27/2024)  Alcohol Screen: Low Risk (12/14/2023)  Depression (PHQ2-9): Low Risk (03/15/2024)  Financial Resource Strain: Medium Risk (01/07/2024)  Physical Activity: Inactive (01/07/2024)  Social Connections: Moderately Isolated (01/07/2024)  Stress: Stress Concern Present (01/07/2024)  Tobacco Use: Low Risk (06/27/2024)  Health Literacy: Adequate Health Literacy (12/14/2023)     Readmission Risk Interventions    07/03/2024   10:03 AM 06/29/2024   10:45 AM  Readmission Risk Prevention Plan  Post Dischage Appt  Complete  Medication Screening Complete Complete  Transportation Screening Complete Complete

## 2024-07-03 NOTE — Hospital Course (Signed)
 68 y.o. female with medical history significant of multiple sclerosis presents to the ED from home via EMS due to generalized weakness    Assessment and Plan:   Concern for flare/pseudo flare of multiple sclerosis - Likely secondary to viral URI.  MRI brain without any new or enhancing lesions.  Showing improvement in weakness.  Neurology consulted considered to be MS pseudo flare.  Symptomatic treatment on board.   Physical debilitation and muscle weakness - Secondary to above.  Showing improvement.  Evaluated by PT/OT.  Recommending STR.   Upper respiratory infection (POA) - Nasal congestion and sore throat with cough on presentation.  Symptomatic therapy on board.  Showing improvement.   Hypothyroidism/GERD/hyperlipidemia - Resume home medication regiment   Migraines - Takes zonisamide  100 mg daily and sumatriptan  100 mg for migraine rescue.   Goals of care - Disposition likely to SNF tomorrow.

## 2024-07-03 NOTE — Progress Notes (Addendum)
 Nurse at bedside,patient alert and oriented times four. Blood pressure 108/57,Dr Carliss Canales notified. No c/o pain or discomfort noted.Plan of care on going.

## 2024-07-20 ENCOUNTER — Telehealth: Payer: Self-pay | Admitting: Internal Medicine

## 2024-07-20 NOTE — Telephone Encounter (Signed)
Called patient left voice mail to schedule hospital follow up

## 2024-07-20 NOTE — Telephone Encounter (Unsigned)
 Copied from CRM #[id number]. Topic: Appointments - Scheduling Inquiry for Clinic >> Jul 20, 2024  8:41 AM Olam RAMAN wrote: Reason for CRM: [id number] [facility] Pt needs a cb for an appt for 1/16 for rehab discharge. Caller stated to contact pt not rehab Direct contact with [phone]   ----------------------------------------------------------------------- From previous Reason for Contact - Scheduling: Patient/patient representative is calling to schedule an appointment. Refer to attachments for appointment information.

## 2024-07-20 NOTE — Telephone Encounter (Signed)
 scheduled

## 2024-07-24 ENCOUNTER — Telehealth: Payer: Self-pay

## 2024-07-24 NOTE — Transitions of Care (Post Inpatient/ED Visit) (Signed)
 "  07/24/2024  Name: Pamela Roman MRN: [id number] DOB: 1956/01/18  Today's TOC FU Call Status: Today's TOC FU Call Status:: Successful TOC FU Call Completed TOC FU Call Complete Date: 07/24/24  Patient's Name and Date of Birth confirmed. Name, DOB  Transition Care Management Follow-up Telephone Call Date of Discharge: 07/21/24 Discharge Facility: Other [facility]) Name of Other ([facility]) Discharge Facility: [name] Type of Discharge: Inpatient Admission Primary Inpatient Discharge Diagnosis:: dysphagia How have you been since you were released from the hospital?: Better Any questions or concerns?: No  Items Reviewed: Did you receive and understand the discharge instructions provided?: Yes Medications obtained,verified, and reconciled?: Yes (Medications Reviewed) Any new allergies since your discharge?: No Dietary orders reviewed?: Yes Do you have support at home?: Yes People in Home [RPT]: sibling(s)  Medications Reviewed Today: Medications Reviewed Today     Reviewed by Emmitt Pan, LPN (Licensed Practical Nurse) on 07/24/24 at 1142  Med List Status: <None>   Medication Order Taking? Sig Documenting Provider Last Dose Status Informant  acetaminophen  (TYLENOL ) 325 MG tablet [id number] Yes Take 2 tablets (650 mg total) by mouth every 6 (six) hours as needed for mild pain (pain score 1-3), fever or headache. Pearlean Manus, MD  Active Self, Pharmacy Records  alendronate  (FOSAMAX ) 70 MG tablet [id number] Yes Take 1 tablet (70 mg total) by mouth once a week. Take with a full glass of water on an empty stomach. Rashid, Farhan, MD  Active   BETA CAROTENE PO [id number] Yes Take 1 capsule by mouth in the morning. Vitamin A, unknown strength. [provider]  Active Self, Pharmacy Records  Cholecalciferol  (VITAMIN D3) 125 MCG (5000 UT) CAPS [id number] Yes Take 1 capsule (5,000 Units total) by mouth daily. Tobie Suzzane POUR, MD  Active Self, Pharmacy Records   levothyroxine  (SYNTHROID ) 50 MCG tablet [id number] Yes TAKE 1 TABLET DAILY BEFORE BREAKFAST Tobie Suzzane POUR, MD  Active Self, Pharmacy Records  Multiple Vitamins-Minerals (ONE-A-DAY WOMENS 50 PLUS) TABS [id number] Yes Take 1 tablet by mouth in the morning. [provider]  Active Self, Pharmacy Records  sertraline  (ZOLOFT ) 25 MG tablet [id number] Yes Take 1 tablet (25 mg total) by mouth daily. Carvin Arvella HERO, MD  Active Self, Pharmacy Records  simvastatin  (ZOCOR ) 20 MG tablet [id number] Yes TAKE 1 TABLET DAILY Patel, Rutwik K, MD  Active Self, Pharmacy Records  SUMAtriptan  (IMITREX ) 100 MG tablet [id number] Yes TAKE 1 TABLET EVERY 2 HOURS AS NEEDED FOR MIGRAINE. MAY REPEAT IN 2 HOURS IF HEADACHE PERSISTS OR RECURS Skeet Juliene SAUNDERS, DO  Active Self, Pharmacy Records  zonisamide  (ZONEGRAN ) 100 MG capsule [id number] Yes Take 1 capsule (100 mg total) by mouth daily. Skeet Juliene SAUNDERS, DO  Active Self, Pharmacy Records            Home Care and Equipment/Supplies: Were Home Health Services Ordered?: Yes Name of Home Health Agency:: [facility] Has Agency set up a time to come to your home?: Yes First Home Health Visit Date: 07/24/24 Any new equipment or medical supplies ordered?: NA  Functional Questionnaire: Do you need assistance with bathing/showering or dressing?: No Do you need assistance with meal preparation?: No Do you need assistance with eating?: No Do you have difficulty maintaining continence: No Do you need assistance with getting out of bed/getting out of a chair/moving?: No Do you have difficulty managing or taking your medications?: No  Follow up appointments reviewed: PCP Follow-up appointment confirmed?: Yes Date of PCP follow-up appointment?: 07/27/24 Follow-up Provider: [facility]  Follow-up appointment confirmed?: NA Do you need transportation to your follow-up appointment?: No Do you understand care options if your condition(s) worsen?: Yes-patient  verbalized understanding    SIGNATURE Julian Lemmings, LPN [facility] Nurse Health Advisor Direct Dial [phone]  "

## 2024-07-27 ENCOUNTER — Telehealth: Payer: Self-pay | Admitting: Family Medicine

## 2024-07-27 ENCOUNTER — Encounter: Payer: Self-pay | Admitting: Family Medicine

## 2024-07-27 DIAGNOSIS — R531 Weakness: Secondary | ICD-10-CM | POA: Insufficient documentation

## 2024-07-27 NOTE — Progress Notes (Signed)
 "  Virtual Visit via Video Note  I connected with Pamela Roman on 07/27/24 at  1:00 PM EST by a video enabled telemedicine application and verified that I am speaking with the correct person using two identifiers.  Patient Location: Home Provider Location: Home Office  I discussed the limitations, risks, security, and privacy concerns of performing an evaluation and management service by video and the availability of in person appointments. I also discussed with the patient that there may be a patient responsible charge related to this service. The patient expressed understanding and agreed to proceed.  Subjective: PCP: Tobie Suzzane POUR, MD  Chief Complaint  Patient presents with   Hospitalization Follow-up    Follow up d/c on 07/21/24, reports feeling well, did PT at rehab and is now doing PT at home.    HPI The patient presents today for hospital follow-up after being admitted from 06/27/2024 to 07/03/2024 for generalized weakness secondary to a viral illness. During hospitalization, MRI of the brain showed no new or enhancing lesions. Neurology was consulted, and her symptoms were felt to be consistent with an MS pseudo-flare.  The patient reports gradual improvement in weakness during her hospital course. She was discharged to a skilled nursing facility  [facility]), where she received physical and occupational therapy for approximately 2.5 weeks, and was discharged home on 07/21/2024.  She is currently at home receiving home physical therapy and occupational therapy twice weekly and reports a scheduled neurology follow-up on Tuesday, 08/01/2024.  At todays visit, the patient reports feeling well overall and denies weakness, fever, chills, chest pain, or shortness of breath.   ROS: Per HPI Current Medications[1]  Observations/Objective: There were no vitals filed for this visit. Physical Exam Patient is well-developed, well-nourished in no acute distress.  Resting comfortably  at home.  Head is normocephalic, atraumatic.  No labored breathing.  Speech is clear and coherent with logical content.  Patient is alert and oriented at baseline.   Assessment and Plan: Weakness Assessment & Plan: Labs and imaging studies reviewed. Encouraged the patient to continue physical therapy and occupational therapy as scheduled and to follow up with her neurologist as planned. CBC and BMP will be obtained today for further assessment.  Advised the patient to seek urgent medical care and notify her neurologist if she experiences symptoms suggestive of multiple sclerosis exacerbation, including: New or worsening weakness Numbness or tingling Vision changes Difficulty with balance or coordination New speech changes Severe fatigue not relieved with rest   Orders: -     CBC with Differential/Platelet -     BMP8+EGFR    Follow Up Instructions: No follow-ups on file.   I discussed the assessment and treatment plan with the patient. The patient was provided an opportunity to ask questions, and all were answered. The patient agreed with the plan and demonstrated an understanding of the instructions.   The patient was advised to call back or seek an in-person evaluation if the symptoms worsen or if the condition fails to improve as anticipated.  The above assessment and management plan was discussed with the patient. The patient verbalized understanding of and has agreed to the management plan.   Meade JENEANE Gerlach, FNP     [1]  Current Outpatient Medications:    acetaminophen  (TYLENOL ) 325 MG tablet, Take 2 tablets (650 mg total) by mouth every 6 (six) hours as needed for mild pain (pain score 1-3), fever or headache., Disp: , Rfl:    alendronate  (FOSAMAX ) 70 MG  tablet, Take 1 tablet (70 mg total) by mouth once a week. Take with a full glass of water on an empty stomach., Disp: , Rfl:    BETA CAROTENE PO, Take 1 capsule by mouth in the morning. Vitamin A, unknown  strength., Disp: , Rfl:    Cholecalciferol  (VITAMIN D3) 125 MCG (5000 UT) CAPS, Take 1 capsule (5,000 Units total) by mouth daily., Disp: 30 capsule, Rfl: 11   levothyroxine  (SYNTHROID ) 50 MCG tablet, TAKE 1 TABLET DAILY BEFORE BREAKFAST, Disp: 90 tablet, Rfl: 3   Multiple Vitamins-Minerals (ONE-A-DAY WOMENS 50 PLUS) TABS, Take 1 tablet by mouth in the morning., Disp: , Rfl:    sertraline  (ZOLOFT ) 25 MG tablet, Take 1 tablet (25 mg total) by mouth daily., Disp: 90 tablet, Rfl: 1   simvastatin  (ZOCOR ) 20 MG tablet, TAKE 1 TABLET DAILY, Disp: 90 tablet, Rfl: 3   SUMAtriptan  (IMITREX ) 100 MG tablet, TAKE 1 TABLET EVERY 2 HOURS AS NEEDED FOR MIGRAINE. MAY REPEAT IN 2 HOURS IF HEADACHE PERSISTS OR RECURS, Disp: 27 tablet, Rfl: 0   zonisamide  (ZONEGRAN ) 100 MG capsule, Take 1 capsule (100 mg total) by mouth daily., Disp: 90 capsule, Rfl: 2  "

## 2024-07-27 NOTE — Assessment & Plan Note (Signed)
 Labs and imaging studies reviewed. Encouraged the patient to continue physical therapy and occupational therapy as scheduled and to follow up with her neurologist as planned. CBC and BMP will be obtained today for further assessment.  Advised the patient to seek urgent medical care and notify her neurologist if she experiences symptoms suggestive of multiple sclerosis exacerbation, including: New or worsening weakness Numbness or tingling Vision changes Difficulty with balance or coordination New speech changes Severe fatigue not relieved with rest

## 2024-07-31 NOTE — Progress Notes (Unsigned)
 "  Virtual Visit via Video Note:   Consent was obtained for video visit:  Yes.   Answered questions that patient had about telehealth interaction:  Yes.   I discussed the limitations, risks, security and privacy concerns of performing an evaluation and management service by telemedicine. I also discussed with the patient that there may be a patient responsible charge related to this service. The patient expressed understanding and agreed to proceed.  Pt location: Home Physician Location: office Name of referring provider:  Tobie Suzzane POUR, MD I connected with Ginnie VEAR Furnace at patients initiation/request on 08/01/2024 at 10:30 AM EST by video enabled telemedicine application and verified that I am speaking with the correct person using two identifiers. Pt MRN:  [id number] Pt DOB:  Mar 02, 1956 Video Participants:  Ginnie VEAR Furnace  Assessment/Plan:   Multiple sclerosis, progressed weakness aggravated by recent illnesses (flu, UTI) and deconditioning Migraine without aura, without status migrainosus, not intractable     1.  DMT:  [facility]  (finished second cycle) 2.  Migraine prevention:  zonisamide  100mg  QD 3.  Migraine rescue:  Sumatriptan  100mg  4.  D3 5000 IU daily   5.  Home PT 6.  Follow up 6 months.     Subjective:  Pamela Roman is a 69 year old left-handed white female who follows up for multiple sclerosis.  MRI personally reviewd.   UPDATE: Current DMT:  [facility]  (started April 2024, second cycle April and May 2025) Other current medications:  Sertraline  25mg  daily; sumatriptan  100mg  PRN; D 5000 IU daily; levothyroxine ; Aleve BID for pain, zonisamide  100mg  every day, gabapentin  100mg  TID, sumatriptan  100mg  PRN  Admitted to [facility] on 06/27/2024 for weakness.  MRI of brain revealed no new or enhancing lesions.  Found to have viral URI and believed to be pseudo-exacerbation.  Discharged to [facility]  She has been home for a week.  She is doing well.  She is doing her  exercises at home and will be starting home PT.    Imaging: 06/27/2024 MRI BRAIN W WO:  Unchanged advanced T2 hyperintensities in the white matter, compatible with multiple sclerosis. No new or enhancing lesions. 10/31/2023 MRI BRAIN W WO:  1. Chronically advanced demyelinating disease is stable since January (2025) Brain MRI. No active or progressed demyelinating lesions. 10/31/2023 MRI C-SPINE W WO:  1. Advanced chronic cervical spinal cord demyelination is stable since January (2025) MRI, severe at the cervicothoracic junction with stable chronic cord volume loss there. No active or progressed demyelinating lesions. 2. Stable comparatively mild for age cervical spine degeneration. 10/31/2023 MRI T-SPINE W WO:  1. Chronic thoracic spinal cord demyelination appears stable. No active or progressed demyelinating lesion identified. 2. Numerous chronic compression fractures in the thoracic and upper lumbar spine remain stable. However, there is a solitary new subcentimeter and enhancing T8 vertebral body lesion, indeterminate. Recommend repeat thoracic MRI in 3 months to evaluate for stability. No other acute osseous abnormality.   Vision:  If she looks at distance, she sees horizontal double vision.  Trouble writing and reading.  She had cataract surgery in 2021 and it didn't improve.  Likely related to INO. Motor: as above Sensory:  numb in legs. Gait:  wheelchair bound.   Bowel/Bladder:  Reports both bowel and bladder incontinence.  Fatigue:  yes Cognition:  Word finding issues.   Mood:  Worsening depression.  Seen in the ED at [facility] on 8/8 afater deliberately ingesting the full 90 day bottle of sertraline .  Does not feel  suicidal.   Migraines:  On zonisamide  and sumatriptan .  No recent migraines. On disability.     HISTORY: She was diagnosed with multiple sclerosis in the early 1990s after experiencing right optic neuritis.  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  She  underwent LP and MRIs, which were consistent with MS.  She initially started Betaseron and then Avonex until 2011, after which she was switched to Tysabri.  Within the last 5 years, she has had progression of disease, but particularly worse since moving here in March.  She has fallen 4 times.  She started using a walker about 5 years ago.     She had an MS flare in May 2021, describing increased difficulty walking and bilateral lower extremity numbness and tingling.  For paresthesias, she was started on gabapentin .  MRI of spinal cord showed acute lesions.  Started Mayzent  in July 2021 but patient noted increased paresthesias in the legs.  She was prescribed high-dose prednisone .  In September 2023, she reported worsening bilateral leg weakness.  Home Solu-Medrol  infusions were too expensive, so she received high-dose prednisone  that seemed to work and was back to baseline.  On 12/4, her legs became very weak again and she couldn't move them. However, weakness resolved overnight.  The weakness returned on 12/9, for which she was admitted to [facility].  MRI of brain/cervical/thoracic with and without contrast on 12/11 revealed no new lesions in brain and cervical spine but did demonstrate new lesion at T12-L1 and enhancing lesion at T5-T6.  She received 3 day course of 1gm IV Solu-Medrol  and discharged to SNF.  She has chronic low back pain with vertebral fractures and osteoporosis.  NCV-EMG of lower extremities on 09/23/2021 showed bilateral chronic L3-4 radiculopathy.  MRI Lumbar spine on 11/15/2020 showed multilevel spondylosis with moderate spinal canal stenosis and severe bilateral lateral recess stenosis at L4-L5 as well as chronic compression deformities of T11, L1, and L2, including severe right foraminal stenosis at L2-3.   Declined referral to neurosurgery at that time.    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 She then was receiving Solu-Medrol  every month.  Mayzent  from June 2021-Jan 2024 (breakthrough flare) Other past medications: Gabapentin  (weakness)   Imaging: 07/12/2023 MRI BRAIN W WO:  No visible change since the study of September. Widespread foci of demyelination within the cerebellum, brainstem and cerebral hemispheric white matter. No new or progressive lesions. No lesions show restricted diffusion or contrast enhancement. 07/12/2023 MRI C-SPINE W WO:  1. Numerous foci of abnormal T2 signal within the cervical cord without evidence of change or progression since September. Cord volume loss at the C7-T1 level is similar. No abnormal regional enhancement. 2. Mild non-compressive spondylosis at C5-6 and C6-7. 07/12/2023 MRI T-SPINE W WO:  1. No change since the prior study. Small foci of T2 signal within the cord consistent with the history of multiple sclerosis. No evidence of progressive disease. No abnormal contrast enhancement. 2. Old healed partial compression fractures at T3, T5, T6, T10 and T11. Old fracture at T12 with previous vertebral augmentation. No evidence of recent or progressive fracture. 07/12/2023 MRI L-SPINE W WO:  1. Old healed augmented fracture at L1. Old healed inferior endplate fracture at L2. No new fracture, progression or ongoing edema. 2. Mild degenerative changes elsewhere in the lumbar spine as outlined above. Mild narrowing of the  lateral recesses and foramina at L2-3 and L3-4 but no visible neural compression. 03/26/2023 MRI BRAIN W WO:  Similar extensive chronic demyelination intracranially and in the cervical and thoracic cord. No new lesions or evidence of active demyelination. 03/26/2023 MRI C & T-SPINE W WO:  Similar extensive chronic demyelination intracranially and in the cervical and thoracic cord. No new lesions or evidence of active demyelination. 06/15/2022 MRI BRAIN W WO:  Findings consistent with the patient's history of  multiple sclerosis. No new lesions are visualized in the brain 06/15/2022 MRI C-SPINE W WO:  No new lesions are visualized in the cervical spine.  No evidence of active demyelination in the cervical spine. 06/15/2022 MRI T-SPINE W WO:   Extensive demyelinating lesions throughout the thoracic spinal cord, with a new demyelinating lesion seen at the T12-L1 level. There may be a focal region of contrast enhancement at the T5-T6 level, which could suggest suggesting active demyelination. New acute to subacute superior endplate compression deformity at the T10 level. 12/31/2021 MRI BRAIN W WO:  1. Findings consistent with widespread chronic demyelinating disease/multiple sclerosis. Overall, disease burden is not significantly changed from previous, with no definite new lesions. No evidence for active demyelination on today's exam. 2. Underlying mildly advanced cerebral atrophy for age. 12/31/2021 MRI C-SPINE W WO:  1. No significant interval change in extensive patchy signal abnormality throughout the cervical spinal cord, consistent with history of chronic multiple sclerosis. No new lesions or significant disease progression. No evidence for active demyelination.  2. New mild chronic compression deformity at the superior endplate of T3.  3. Moderate spondylosis at C5-6 and C6-7 without significant stenosis. 12/31/2021 MRI T-SPINE W WO:  1. Diffuse patchy and hazy signal abnormality involving the majority of the thoracic cord, most pronounced at the level of T6-7. In comparison with prior exam, these changes are likely similar, although prior exam is degraded by motion, consistent with history of chronic demyelinating disease/multiple sclerosis. Overall, appearance is similar to prior, without evidence for significant disease progression. No definite new lesions. No active demyelination. 2. New chronic compression deformities involving the T3, T5, and T6 vertebral bodies. Additional chronic compression  fractures at T11 through L2 are stable. 09/16/2020 MRI BRAIN W WO:  stable with resolution of previous small active lesion 09/09/2020 MRI C & T-SPINE W WO:  1. Unchanged distribution of demyelinating lesions throughout the cervical spinal cord. Motion degraded images of the thoracic spine,but grossly unchanged. 2. No evidence of active demyelination within the cervical or thoracic spine.  04/14/2020 MRI BRAIN W WO:  subcentimeter focus of enhancement within the left posterior medulla, consistent with active demyelination. 12/23/2019 MRI CERVICAL SPINE W WO:  1. Extensive patchy T2 signal abnormality throughout the cervical spinal cord, consistent with demyelinating disease/multiple sclerosis. Faint patchy postcontrast enhancement about a few lesions.  AT C4 AND C7, consistent with active demyelination.  2. Mild multilevel cervical spondylosis without significant spinal stenosis. Mild bilateral C6 foraminal narrowing. 12/23/2019 MRI THORACIC SPINE W WO:  1. Patchy signal abnormality throughout much of the thoracic spinal cord, consistent with demyelinating disease/multiple sclerosis. No evidence for active demyelination within the thoracic spinal cord.  2. Acute to subacute compression fracture extending through the inferior endplate of L2 with up to 40% height loss and 4 mm of bony retropulsion, incompletely assessed on this exam. Finding could be further evaluated with dedicated MRI of the lumbar spine as clinically desired.  3. Additional chronic lower thoracic compression fractures as above. 07/24/2019 MRI BRAIN W WO:  1.  Bilateral periventricular and radial white matter hyperintensities involving the frontal parietal and temporal regions bilaterally stable compared to 04/25/19.  2.  Compared to prior study the white matter hyperintensities, T1 low signal foci associated with these, and the absence of contrast enhancement appears stable.  3.  No evidence of new plaques or contrast enhancement to suggest  new inflammatory foci. 04/25/2019 MRI BRAIN W WO:  CEREBRUM:  Extensive multiple sclerosis plaques are visualized.  These is seen adjacent to the tmporal horns bilaterally extending to surround the atria and occipital horns bilaterally and extensively throughout the periventricular white matter bilateral.  These also extend into the bifrontal corona radiata and centrum semiovale.  No involvement of brainstem or cerebellum.  Past Medical History: Past Medical History:  Diagnosis Date   Anxiety    Depression    [employer] 08/06/2020   Hx of migraines    Hyperglycemia    Hypothyroidism    Migraines    Multiple sclerosis    Neuromuscular disorder (HCC)     Medications: Outpatient Encounter Medications as of 08/01/2024  Medication Sig   acetaminophen  (TYLENOL ) 325 MG tablet Take 2 tablets (650 mg total) by mouth every 6 (six) hours as needed for mild pain (pain score 1-3), fever or headache.   alendronate  (FOSAMAX ) 70 MG tablet Take 1 tablet (70 mg total) by mouth once a week. Take with a full glass of water on an empty stomach.   BETA CAROTENE PO Take 1 capsule by mouth in the morning. Vitamin A, unknown strength.   Cholecalciferol  (VITAMIN D3) 125 MCG (5000 UT) CAPS Take 1 capsule (5,000 Units total) by mouth daily.   levothyroxine  (SYNTHROID ) 50 MCG tablet TAKE 1 TABLET DAILY BEFORE BREAKFAST   Multiple Vitamins-Minerals (ONE-A-DAY WOMENS 50 PLUS) TABS Take 1 tablet by mouth in the morning.   sertraline  (ZOLOFT ) 25 MG tablet Take 1 tablet (25 mg total) by mouth daily.   simvastatin  (ZOCOR ) 20 MG tablet TAKE 1 TABLET DAILY   SUMAtriptan  (IMITREX ) 100 MG tablet TAKE 1 TABLET EVERY 2 HOURS AS NEEDED FOR MIGRAINE. MAY REPEAT IN 2 HOURS IF HEADACHE PERSISTS OR RECURS   zonisamide  (ZONEGRAN ) 100 MG capsule Take 1 capsule (100 mg total) by mouth daily.   No facility-administered encounter medications on file as of 08/01/2024.    Allergies: Allergies[1]  Family History: Family History   Problem Relation Age of Onset   Dementia Mother    Dementia Father     Observations/Objective:   No acute distress.  Alert and oriented.  Speech fluent and not dysarthric.  Language intact.  Eyes orthophoric on primary gaze.  Face symmetric.   Follow up Instructions:      -I discussed the assessment and treatment plan with the patient. The patient was provided an opportunity to ask questions and all were answered. The patient agreed with the plan and demonstrated an understanding of the instructions.   The patient was advised to call back or seek an in-person evaluation if the symptoms worsen or if the condition fails to improve as anticipated.   Juliene Lamar Dunnings, DO     [1]  Allergies Allergen Reactions   Codeine Nausea And Vomiting   "

## 2024-08-01 ENCOUNTER — Encounter: Payer: Self-pay | Admitting: Neurology

## 2024-08-01 ENCOUNTER — Telehealth: Admitting: Neurology

## 2024-08-01 DIAGNOSIS — G35D Multiple sclerosis, unspecified: Secondary | ICD-10-CM

## 2024-08-07 NOTE — Progress Notes (Unsigned)
 BH MD/PA/NP OP Progress Note  08/10/2024 10:28 AM MEOSHA CASTANON  MRN:  [id number]  Visit Diagnosis:    ICD-10-CM   1. Severe episode of recurrent major depressive disorder, without psychotic features (HCC)  F33.2 sertraline  (ZOLOFT ) 25 MG tablet       Assessment: ELLEEN COULIBALY is a 69 y.o. female with a history of MDD who presented to [facility] for initial evaluation on 02/26/22 following a suicide attempt by intentional overdose on 02/10/22.   At initial evaluation patient reported that she had been struggling with depressed mood, fatigue, isolated, and hopelessness about her future leading up to the suicide attempt. She denied any prior self harm or thoughts of suicide prior to that along with and AVH, manic symptoms, paranoia, or feelings of being a burden. Of note patient has MS that has been progressing in severity which had in turn left her feeling isolated. Since discharging from the inpatient psychiatric facility patient reported feeling more hopeful, future oriented, less isolated (family in touch and visiting more), and motivated to make adjustments in her life so she can engage in the activities she enjoys. Patient denied any suicidality at time of initial evaluation and was able to engage in making a safety plan with several steps for if depression/suicidality were to progress.   Ginnie VEAR Furnace presents for follow-up evaluation. Today, 08/10/24, patient reports moods have improved and been stable in the interim. She has continued to take the Zoloft  25 mg daily and denies any adverse side effects.  We will continue on current regimen and follow-up in 6 months (08/10/24)  Psychotherapeutic interventions were used during today's session. From 10:10 AM to 10:30 AM. Therapeutic interventions included empathic listening, supportive therapy. Used supportive interviewing techniques to provide emotional validation. Improvement was evidenced by patient's participation and identified  commitment to therapy goals.   Plan: - Continue Zoloft  25 mg QD - TSH, Vit D WNL - MRI brain 06/2024 and spine reviewed, stable MS from MRI from April 2025 - Crisis response plan discussed, along with crisis resources - Therapy/care coordination with Joanna Saporito once a week - Supportive interviewing techniques employed, patient working towards isolating less and connecting with more social supports - Follow up in 6 months  Chief Complaint:  Chief Complaint  Patient presents with   Follow-up   HPI: Susann presents reporting that she is doing real well, other then being frustrated that she was hospitalized a couple days before christmas. She had a cold that caused a pseudo exacerbation of her MS. Empathic listening techniques were used and support was provided. Since then she went to SNF for 3 weeks and now has been in home PT to recover her strength. After finishing she will return to the former outpatient PT she used to go to.   She has learned some new lessons like to wear a mask when she goes out in public.   Moods have been stable without any reoccurrence of depression. She is still taking the Sertraline  and denies adverse side effects.  Past Psychiatric History: Patient reports that she had no prior psych history until 2021. At that time she was struggling with grief and depression following the passing of her husband and was started on Zoloft  25 mg, which she continued up until she overdosed on 02/10/22. Following her inpatient admission to [facility] patient ws discharged on Zoloft  50 mg QD. She reported stopping the medication after her discharge as she was feeling better. Patient was  restarted on Zoloft  25 mg for another year. She had reported continued improved mood over this time and it was decided to trial tapering off medications to see if mood remained upbeat. She denies any trials of other psychotropic medications.   Past Medical History:  Past Medical  History:  Diagnosis Date   Anxiety    Depression    Phreesia 08/06/2020   Hx of migraines    Hyperglycemia    Hypothyroidism    Migraines    Multiple sclerosis    Neuromuscular disorder [facility])     Past Surgical History:  Procedure Laterality Date   CATARACT EXTRACTION W/PHACO Left 01/01/2020   Procedure: CATARACT EXTRACTION PHACO AND INTRAOCULAR LENS PLACEMENT LEFT EYE;  Surgeon: Harrie Agent, MD;  Location: [facility];  Service: Ophthalmology;  Laterality: Left;  CDE: 3.37   CATARACT EXTRACTION W/PHACO Right 01/19/2020   Procedure: CATARACT EXTRACTION PHACO AND INTRAOCULAR LENS PLACEMENT RIGHT EYE;  Surgeon: Harrie Agent, MD;  Location: [facility];  Service: Ophthalmology;  Laterality: Right;  CDE: 3.16   TONSILLECTOMY      Family Psychiatric History: Denies  Family History:  Family History  Problem Relation Age of Onset   Dementia Mother    Dementia Father     Social History:  Social History   Socioeconomic History   Marital status: Widowed    Spouse name: Not on file   Number of children: 0   Years of education: 52   Highest education level: Master's degree (e.g., MA, MS, MEng, MEd, MSW, MBA)  Occupational History   Not on file  Tobacco Use   Smoking status: Never    Passive exposure: Never   Smokeless tobacco: Never   Tobacco comments:    Lived with smoker  Vaping Use   Vaping status: Never Used  Substance and Sexual Activity   Alcohol use: Not Currently   Drug use: Never   Sexual activity: Not Currently    Partners: Male  Other Topics Concern   Not on file  Social History Narrative   ** Merged History Encounter ** Lives with her sister two story home stays on the first floor   Left handed   Caffeine none   Social Drivers of Health   Tobacco Use: Low Risk (08/10/2024)   Patient History    Smoking Tobacco Use: Never    Smokeless Tobacco Use: Never    Passive Exposure: Never  Financial Resource Strain: Medium Risk (01/07/2024)   Overall Financial Resource  Strain (CARDIA)    Difficulty of Paying Living Expenses: Somewhat hard  Food Insecurity: No Food Insecurity (06/27/2024)   Epic    Worried About Radiation Protection Practitioner of Food in the Last Year: Never true    Ran Out of Food in the Last Year: Never true  Transportation Needs: No Transportation Needs (06/27/2024)   Epic    Lack of Transportation (Medical): No    Lack of Transportation (Non-Medical): No  Physical Activity: Inactive (01/07/2024)   Exercise Vital Sign    Days of Exercise per Week: 0 days    Minutes of Exercise per Session: Not on file  Stress: Stress Concern Present (01/07/2024)   [employer] of Occupational Health - Occupational Stress Questionnaire    Feeling of Stress: To some extent  Social Connections: Moderately Isolated (01/07/2024)   Social Connection and Isolation Panel    Frequency of Communication with Friends and Family: Once a week    Frequency of Social Gatherings with Friends and Family: Once a week  Attends Religious Services: More than 4 times per year    Active Member of Clubs or Organizations: Yes    Attends Banker Meetings: More than 4 times per year    Marital Status: Widowed  Depression (PHQ2-9): Low Risk (07/27/2024)   Depression (PHQ2-9)    PHQ-2 Score: 0  Alcohol Screen: Low Risk (12/14/2023)   Alcohol Screen    Last Alcohol Screening Score (AUDIT): 0  Housing: Low Risk (06/27/2024)   Epic    Unable to Pay for Housing in the Last Year: No    Number of Times Moved in the Last Year: 0    Homeless in the Last Year: No  Utilities: Not At Risk (06/27/2024)   Epic    Threatened with loss of utilities: No  Health Literacy: Adequate Health Literacy (12/14/2023)   B1300 Health Literacy    Frequency of need for help with medical instructions: Never    Allergies:  Allergies  Allergen Reactions   Codeine Nausea And Vomiting    Current Medications: Current Outpatient Medications  Medication Sig Dispense Refill   acetaminophen  (TYLENOL )  325 MG tablet Take 2 tablets (650 mg total) by mouth every 6 (six) hours as needed for mild pain (pain score 1-3), fever or headache.     alendronate  (FOSAMAX ) 70 MG tablet Take 1 tablet (70 mg total) by mouth once a week. Take with a full glass of water on an empty stomach.     BETA CAROTENE PO Take 1 capsule by mouth in the morning. Vitamin A, unknown strength.     Cholecalciferol  (VITAMIN D3) 125 MCG (5000 UT) CAPS Take 1 capsule (5,000 Units total) by mouth daily. 30 capsule 11   levothyroxine  (SYNTHROID ) 50 MCG tablet TAKE 1 TABLET DAILY BEFORE BREAKFAST 90 tablet 3   Multiple Vitamins-Minerals (ONE-A-DAY WOMENS 50 PLUS) TABS Take 1 tablet by mouth in the morning.     sertraline  (ZOLOFT ) 25 MG tablet Take 1 tablet (25 mg total) by mouth daily. 90 tablet 1   simvastatin  (ZOCOR ) 20 MG tablet TAKE 1 TABLET DAILY 90 tablet 3   SUMAtriptan  (IMITREX ) 100 MG tablet TAKE 1 TABLET EVERY 2 HOURS AS NEEDED FOR MIGRAINE. MAY REPEAT IN 2 HOURS IF HEADACHE PERSISTS OR RECURS 27 tablet 0   zonisamide  (ZONEGRAN ) 100 MG capsule Take 1 capsule (100 mg total) by mouth daily. 90 capsule 2   No current facility-administered medications for this visit.   Psychiatric Specialty Exam: Review of Systems  There were no vitals taken for this visit.There is no height or weight on file to calculate BMI.  General Appearance: Well Groomed  Eye Contact:  Good  Speech:  Clear and Coherent  Volume:  Normal  Mood:  Euthymic  Affect:  Congruent  Thought Process:  Coherent and Goal Directed  Orientation:  Full (Time, Place, and Person)  Thought Content: Logical   Suicidal Thoughts:  No  Homicidal Thoughts:  No  Memory:  Immediate;   Good  Judgement:  Good  Insight:  Good  Psychomotor Activity:  NA  Concentration:  Concentration: Good  Recall:  Good  Fund of Knowledge: Good  Language: Good  Akathisia:  No    AIMS (if indicated): not done  Assets:  Communication Skills Desire for Improvement  ADL's:  Intact   Cognition: WNL  Sleep:  Good   Metabolic Disorder Labs: Lab Results  Component Value Date   HGBA1C 5.7 (H) 07/16/2022   No results found for: PROLACTIN Lab Results  Component  Value Date   CHOL 218 (H) 07/16/2022   TRIG 128 07/16/2022   HDL 51 07/16/2022   CHOLHDL 4.3 07/16/2022   LDLCALC 144 (H) 07/16/2022   LDLCALC 123 (H) 11/06/2020   Lab Results  Component Value Date   TSH 0.621 07/12/2023   TSH 2.190 07/16/2022    Therapeutic Level Labs: No results found for: LITHIUM No results found for: VALPROATE No results found for: CBMZ   Screenings: GAD-7    Flowsheet Row Telemedicine from 07/27/2024 in [facility] Office Visit from 03/15/2024 in [facility] Office Visit from 02/16/2024 in [facility] Telemedicine from 01/11/2024 in [facility] Office Visit from 08/19/2023 in [facility]  Total GAD-7 Score 0 0 0 0 0   Mini-Mental    Flowsheet Row Office Visit from 03/12/2021 in [facility]  Total Score (max 30 points ) 30   PHQ2-9    Flowsheet Row Telemedicine from 07/27/2024 in [facility] Primary Care Office Visit from 03/15/2024 in [facility] Care Office Visit from 02/16/2024 in [facility] Care Telemedicine from 01/11/2024 in [facility] Primary Care Clinical Support from 12/14/2023 in [facility]  PHQ-2 Total Score 0 0 0 0 2  PHQ-9 Total Score 0 0 0 0 8   Flowsheet Row ED to Hosp-Admission (Discharged) from 06/27/2024 in [facility] from 01[facility]09/2024 in [facility] from 12/22/2023 in [facility]  C-SSRS RISK CATEGORY No Risk No Risk No Risk    Collaboration of Care: Collaboration of Care: Medication Management AEB medication presciption, Primary Care Provider AEB chart  review, and Other provider involved in patient's care AEB ED, hospitalist and neuro chart review  Patient/Guardian was advised Release of Information must be obtained prior to any record release in order to collaborate their care with an outside provider. Patient/Guardian was advised if they have not already done so to contact the registration department to sign all necessary forms in order for us  to release information regarding their care.   Consent: Patient/Guardian gives verbal consent for treatment and assignment of benefits for services provided during this visit. Patient/Guardian expressed understanding and agreed to proceed.    Arvella CHRISTELLA Finder, MD 08/10/2024, 10:28 AM   Virtual Visit via Video Note  I connected with Ginnie Furnace on 08/10/24 at 10:00 AM EST by a video enabled telemedicine application and verified that I am speaking with the correct person using two identifiers.  Location: Patient: Home Provider: Home Office   I discussed the limitations of evaluation and management by telemedicine and the availability of in person appointments. The patient expressed understanding and agreed to proceed.   I discussed the assessment and treatment plan with the patient. The patient was provided an opportunity to ask questions and all were answered. The patient agreed with the plan and demonstrated an understanding of the instructions.   The patient was advised to call back or seek an in-person evaluation if the symptoms worsen or if the condition fails to improve as anticipated.  I provided 20 minutes of non-face-to-face time during this encounter.   Arvella CHRISTELLA Finder, MD

## 2024-08-10 ENCOUNTER — Encounter (HOSPITAL_COMMUNITY): Payer: Self-pay | Admitting: Psychiatry

## 2024-08-10 ENCOUNTER — Telehealth (HOSPITAL_COMMUNITY): Admitting: Psychiatry

## 2024-08-10 DIAGNOSIS — F332 Major depressive disorder, recurrent severe without psychotic features: Secondary | ICD-10-CM

## 2024-08-10 DIAGNOSIS — G35D Multiple sclerosis, unspecified: Secondary | ICD-10-CM

## 2024-08-10 MED ORDER — SERTRALINE HCL 25 MG PO TABS: 25.0000 mg | ORAL_TABLET | Freq: Every day | ORAL | 1 refills | Status: AC

## 2024-08-16 ENCOUNTER — Ambulatory Visit: Admitting: Internal Medicine

## 2024-12-18 ENCOUNTER — Ambulatory Visit

## 2025-02-01 ENCOUNTER — Telehealth (HOSPITAL_COMMUNITY): Admitting: Psychiatry
